# Patient Record
Sex: Female | Born: 1950 | Race: White | Hispanic: No | Marital: Married | Smoking: Former smoker
Health system: Southern US, Community
[De-identification: ages and names within clinical notes are randomized; demographics above are authoritative.]

## PROBLEM LIST (undated history)

## (undated) DIAGNOSIS — I499 Cardiac arrhythmia, unspecified: Secondary | ICD-10-CM

## (undated) DIAGNOSIS — E785 Hyperlipidemia, unspecified: Secondary | ICD-10-CM

## (undated) DIAGNOSIS — M797 Fibromyalgia: Secondary | ICD-10-CM

## (undated) DIAGNOSIS — M1990 Unspecified osteoarthritis, unspecified site: Secondary | ICD-10-CM

## (undated) DIAGNOSIS — T7840XA Allergy, unspecified, initial encounter: Secondary | ICD-10-CM

## (undated) DIAGNOSIS — D649 Anemia, unspecified: Secondary | ICD-10-CM

## (undated) DIAGNOSIS — I10 Essential (primary) hypertension: Secondary | ICD-10-CM

## (undated) DIAGNOSIS — H269 Unspecified cataract: Secondary | ICD-10-CM

## (undated) DIAGNOSIS — K219 Gastro-esophageal reflux disease without esophagitis: Secondary | ICD-10-CM

## (undated) DIAGNOSIS — F419 Anxiety disorder, unspecified: Secondary | ICD-10-CM

## (undated) DIAGNOSIS — S066X9A Traumatic subarachnoid hemorrhage with loss of consciousness of unspecified duration, initial encounter: Secondary | ICD-10-CM

## (undated) HISTORY — PX: TUBAL LIGATION: SHX77

## (undated) HISTORY — PX: HERNIA REPAIR: SHX51

## (undated) HISTORY — DX: Fibromyalgia: M79.7

## (undated) HISTORY — DX: Allergy, unspecified, initial encounter: T78.40XA

## (undated) HISTORY — PX: REPLACEMENT TOTAL KNEE: SUR1224

## (undated) HISTORY — PX: OOPHORECTOMY: SHX86

## (undated) HISTORY — PX: JOINT REPLACEMENT: SHX530

## (undated) HISTORY — DX: Unspecified osteoarthritis, unspecified site: M19.90

## (undated) HISTORY — PX: ESOPHAGOGASTRODUODENOSCOPY: SHX1529

## (undated) HISTORY — DX: Unspecified cataract: H26.9

---
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
## 2008-11-24 DIAGNOSIS — S066XAA Traumatic subarachnoid hemorrhage with loss of consciousness status unknown, initial encounter: Secondary | ICD-10-CM | POA: Insufficient documentation

## 2008-11-24 DIAGNOSIS — S066X9A Traumatic subarachnoid hemorrhage with loss of consciousness of unspecified duration, initial encounter: Secondary | ICD-10-CM

## 2008-11-24 HISTORY — DX: Traumatic subarachnoid hemorrhage with loss of consciousness status unknown, initial encounter: S06.6XAA

## 2008-11-24 HISTORY — DX: Traumatic subarachnoid hemorrhage with loss of consciousness of unspecified duration, initial encounter: S06.6X9A

## 2012-03-17 LAB — HM COLONOSCOPY

## 2013-01-27 DIAGNOSIS — Z6830 Body mass index (BMI) 30.0-30.9, adult: Secondary | ICD-10-CM | POA: Insufficient documentation

## 2013-01-27 HISTORY — DX: Body mass index (BMI) 30.0-30.9, adult: Z68.30

## 2013-09-28 DIAGNOSIS — R002 Palpitations: Secondary | ICD-10-CM | POA: Insufficient documentation

## 2013-09-28 HISTORY — DX: Palpitations: R00.2

## 2014-03-27 DIAGNOSIS — M1712 Unilateral primary osteoarthritis, left knee: Secondary | ICD-10-CM

## 2014-03-27 HISTORY — DX: Unilateral primary osteoarthritis, left knee: M17.12

## 2014-04-05 HISTORY — PX: PARTIAL KNEE ARTHROPLASTY: SHX2174

## 2014-11-21 DIAGNOSIS — M1711 Unilateral primary osteoarthritis, right knee: Secondary | ICD-10-CM

## 2014-11-21 HISTORY — DX: Unilateral primary osteoarthritis, right knee: M17.11

## 2014-12-14 HISTORY — PX: MEDIAL PARTIAL KNEE REPLACEMENT: SHX5965

## 2015-11-06 DIAGNOSIS — K589 Irritable bowel syndrome without diarrhea: Secondary | ICD-10-CM

## 2015-11-06 DIAGNOSIS — K219 Gastro-esophageal reflux disease without esophagitis: Secondary | ICD-10-CM

## 2015-11-06 HISTORY — DX: Gastro-esophageal reflux disease without esophagitis: K21.9

## 2015-11-06 HISTORY — DX: Irritable bowel syndrome, unspecified: K58.9

## 2016-01-22 DIAGNOSIS — K449 Diaphragmatic hernia without obstruction or gangrene: Secondary | ICD-10-CM | POA: Insufficient documentation

## 2016-02-21 HISTORY — PX: NISSEN FUNDOPLICATION: SHX2091

## 2016-03-17 DIAGNOSIS — Z9889 Other specified postprocedural states: Secondary | ICD-10-CM | POA: Insufficient documentation

## 2016-03-17 HISTORY — DX: Other specified postprocedural states: Z98.890

## 2016-04-17 DIAGNOSIS — L853 Xerosis cutis: Secondary | ICD-10-CM | POA: Insufficient documentation

## 2016-04-17 DIAGNOSIS — L918 Other hypertrophic disorders of the skin: Secondary | ICD-10-CM

## 2016-04-17 DIAGNOSIS — L821 Other seborrheic keratosis: Secondary | ICD-10-CM

## 2016-04-17 DIAGNOSIS — D229 Melanocytic nevi, unspecified: Secondary | ICD-10-CM | POA: Insufficient documentation

## 2016-04-17 DIAGNOSIS — D2362 Other benign neoplasm of skin of left upper limb, including shoulder: Secondary | ICD-10-CM

## 2016-04-17 DIAGNOSIS — B372 Candidiasis of skin and nail: Secondary | ICD-10-CM | POA: Insufficient documentation

## 2016-04-17 DIAGNOSIS — L814 Other melanin hyperpigmentation: Secondary | ICD-10-CM | POA: Insufficient documentation

## 2016-04-17 DIAGNOSIS — L738 Other specified follicular disorders: Secondary | ICD-10-CM

## 2016-04-17 DIAGNOSIS — L905 Scar conditions and fibrosis of skin: Secondary | ICD-10-CM | POA: Insufficient documentation

## 2016-04-17 DIAGNOSIS — D1801 Hemangioma of skin and subcutaneous tissue: Secondary | ICD-10-CM

## 2016-04-17 DIAGNOSIS — I499 Cardiac arrhythmia, unspecified: Secondary | ICD-10-CM | POA: Insufficient documentation

## 2016-04-17 HISTORY — DX: Scar conditions and fibrosis of skin: L90.5

## 2016-04-17 HISTORY — DX: Other melanin hyperpigmentation: L81.4

## 2016-04-17 HISTORY — DX: Hemangioma of skin and subcutaneous tissue: D18.01

## 2016-04-17 HISTORY — DX: Melanocytic nevi, unspecified: D22.9

## 2016-04-17 HISTORY — DX: Other seborrheic keratosis: L82.1

## 2016-04-17 HISTORY — DX: Other hypertrophic disorders of the skin: L91.8

## 2016-04-17 HISTORY — DX: Candidiasis of skin and nail: B37.2

## 2016-04-17 HISTORY — DX: Other benign neoplasm of skin of left upper limb, including shoulder: D23.62

## 2016-04-17 HISTORY — DX: Other specified follicular disorders: L73.8

## 2016-04-17 HISTORY — DX: Xerosis cutis: L85.3

## 2016-10-07 DIAGNOSIS — M47817 Spondylosis without myelopathy or radiculopathy, lumbosacral region: Secondary | ICD-10-CM | POA: Insufficient documentation

## 2016-10-07 DIAGNOSIS — M170 Bilateral primary osteoarthritis of knee: Secondary | ICD-10-CM | POA: Insufficient documentation

## 2016-10-07 DIAGNOSIS — G8929 Other chronic pain: Secondary | ICD-10-CM

## 2016-10-07 DIAGNOSIS — M545 Low back pain, unspecified: Secondary | ICD-10-CM

## 2016-10-07 HISTORY — DX: Low back pain, unspecified: M54.50

## 2016-10-07 HISTORY — DX: Spondylosis without myelopathy or radiculopathy, lumbosacral region: M47.817

## 2016-10-07 HISTORY — DX: Other chronic pain: G89.29

## 2016-10-07 HISTORY — DX: Bilateral primary osteoarthritis of knee: M17.0

## 2017-02-11 DIAGNOSIS — I10 Essential (primary) hypertension: Secondary | ICD-10-CM | POA: Diagnosis not present

## 2017-02-11 DIAGNOSIS — R5383 Other fatigue: Secondary | ICD-10-CM | POA: Diagnosis not present

## 2017-02-11 DIAGNOSIS — R6889 Other general symptoms and signs: Secondary | ICD-10-CM | POA: Diagnosis not present

## 2017-02-11 DIAGNOSIS — E785 Hyperlipidemia, unspecified: Secondary | ICD-10-CM | POA: Diagnosis not present

## 2017-02-11 DIAGNOSIS — K219 Gastro-esophageal reflux disease without esophagitis: Secondary | ICD-10-CM | POA: Diagnosis not present

## 2017-02-11 DIAGNOSIS — R0981 Nasal congestion: Secondary | ICD-10-CM | POA: Diagnosis not present

## 2017-02-11 DIAGNOSIS — F329 Major depressive disorder, single episode, unspecified: Secondary | ICD-10-CM | POA: Diagnosis not present

## 2017-02-13 DIAGNOSIS — E559 Vitamin D deficiency, unspecified: Secondary | ICD-10-CM | POA: Diagnosis not present

## 2017-02-13 DIAGNOSIS — I10 Essential (primary) hypertension: Secondary | ICD-10-CM | POA: Diagnosis not present

## 2017-02-13 DIAGNOSIS — R5383 Other fatigue: Secondary | ICD-10-CM | POA: Diagnosis not present

## 2017-02-13 DIAGNOSIS — R7309 Other abnormal glucose: Secondary | ICD-10-CM | POA: Diagnosis not present

## 2017-02-13 DIAGNOSIS — E785 Hyperlipidemia, unspecified: Secondary | ICD-10-CM | POA: Diagnosis not present

## 2017-03-20 DIAGNOSIS — Z87891 Personal history of nicotine dependence: Secondary | ICD-10-CM | POA: Diagnosis not present

## 2017-03-20 DIAGNOSIS — R0789 Other chest pain: Secondary | ICD-10-CM | POA: Diagnosis not present

## 2017-03-20 DIAGNOSIS — E860 Dehydration: Secondary | ICD-10-CM | POA: Diagnosis not present

## 2017-03-20 DIAGNOSIS — R509 Fever, unspecified: Secondary | ICD-10-CM | POA: Diagnosis not present

## 2017-03-20 DIAGNOSIS — D259 Leiomyoma of uterus, unspecified: Secondary | ICD-10-CM | POA: Diagnosis not present

## 2017-03-20 DIAGNOSIS — Z7952 Long term (current) use of systemic steroids: Secondary | ICD-10-CM | POA: Diagnosis not present

## 2017-03-20 DIAGNOSIS — Z792 Long term (current) use of antibiotics: Secondary | ICD-10-CM | POA: Diagnosis not present

## 2017-03-20 DIAGNOSIS — Z79899 Other long term (current) drug therapy: Secondary | ICD-10-CM | POA: Diagnosis not present

## 2017-03-20 DIAGNOSIS — M47816 Spondylosis without myelopathy or radiculopathy, lumbar region: Secondary | ICD-10-CM | POA: Diagnosis not present

## 2017-03-20 DIAGNOSIS — E785 Hyperlipidemia, unspecified: Secondary | ICD-10-CM | POA: Diagnosis not present

## 2017-03-20 DIAGNOSIS — Z7982 Long term (current) use of aspirin: Secondary | ICD-10-CM | POA: Diagnosis not present

## 2017-03-20 DIAGNOSIS — K5730 Diverticulosis of large intestine without perforation or abscess without bleeding: Secondary | ICD-10-CM | POA: Diagnosis not present

## 2017-03-20 DIAGNOSIS — Z79891 Long term (current) use of opiate analgesic: Secondary | ICD-10-CM | POA: Diagnosis not present

## 2017-03-20 DIAGNOSIS — M549 Dorsalgia, unspecified: Secondary | ICD-10-CM | POA: Diagnosis not present

## 2017-04-13 DIAGNOSIS — R509 Fever, unspecified: Secondary | ICD-10-CM | POA: Diagnosis not present

## 2017-04-13 DIAGNOSIS — R3989 Other symptoms and signs involving the genitourinary system: Secondary | ICD-10-CM | POA: Diagnosis not present

## 2017-05-01 DIAGNOSIS — M25561 Pain in right knee: Secondary | ICD-10-CM | POA: Diagnosis not present

## 2017-05-01 DIAGNOSIS — M25562 Pain in left knee: Secondary | ICD-10-CM | POA: Diagnosis not present

## 2017-05-01 DIAGNOSIS — Z96652 Presence of left artificial knee joint: Secondary | ICD-10-CM | POA: Diagnosis not present

## 2017-05-01 DIAGNOSIS — G8929 Other chronic pain: Secondary | ICD-10-CM | POA: Diagnosis not present

## 2017-05-01 DIAGNOSIS — Z96651 Presence of right artificial knee joint: Secondary | ICD-10-CM | POA: Diagnosis not present

## 2017-06-23 DIAGNOSIS — M2550 Pain in unspecified joint: Secondary | ICD-10-CM | POA: Diagnosis not present

## 2017-06-23 DIAGNOSIS — R5383 Other fatigue: Secondary | ICD-10-CM | POA: Diagnosis not present

## 2017-07-10 DIAGNOSIS — F329 Major depressive disorder, single episode, unspecified: Secondary | ICD-10-CM | POA: Diagnosis not present

## 2017-07-10 DIAGNOSIS — M1711 Unilateral primary osteoarthritis, right knee: Secondary | ICD-10-CM | POA: Diagnosis not present

## 2017-07-10 DIAGNOSIS — I10 Essential (primary) hypertension: Secondary | ICD-10-CM | POA: Diagnosis not present

## 2017-07-10 DIAGNOSIS — E785 Hyperlipidemia, unspecified: Secondary | ICD-10-CM | POA: Diagnosis not present

## 2017-09-15 DIAGNOSIS — N644 Mastodynia: Secondary | ICD-10-CM | POA: Diagnosis not present

## 2017-09-15 DIAGNOSIS — Z23 Encounter for immunization: Secondary | ICD-10-CM | POA: Diagnosis not present

## 2017-09-22 DIAGNOSIS — N644 Mastodynia: Secondary | ICD-10-CM | POA: Diagnosis not present

## 2017-10-12 DIAGNOSIS — G8929 Other chronic pain: Secondary | ICD-10-CM | POA: Diagnosis not present

## 2017-10-12 DIAGNOSIS — Z1389 Encounter for screening for other disorder: Secondary | ICD-10-CM | POA: Diagnosis not present

## 2017-10-12 DIAGNOSIS — M545 Low back pain: Secondary | ICD-10-CM | POA: Diagnosis not present

## 2017-10-12 DIAGNOSIS — R1011 Right upper quadrant pain: Secondary | ICD-10-CM | POA: Diagnosis not present

## 2017-10-12 DIAGNOSIS — M25511 Pain in right shoulder: Secondary | ICD-10-CM | POA: Diagnosis not present

## 2017-10-12 DIAGNOSIS — M170 Bilateral primary osteoarthritis of knee: Secondary | ICD-10-CM | POA: Diagnosis not present

## 2017-10-12 DIAGNOSIS — R1012 Left upper quadrant pain: Secondary | ICD-10-CM | POA: Diagnosis not present

## 2017-10-12 DIAGNOSIS — I10 Essential (primary) hypertension: Secondary | ICD-10-CM | POA: Diagnosis not present

## 2017-10-13 DIAGNOSIS — M25511 Pain in right shoulder: Secondary | ICD-10-CM | POA: Diagnosis not present

## 2017-10-13 DIAGNOSIS — M25512 Pain in left shoulder: Secondary | ICD-10-CM | POA: Diagnosis not present

## 2017-10-22 DIAGNOSIS — H532 Diplopia: Secondary | ICD-10-CM | POA: Diagnosis not present

## 2017-10-23 DIAGNOSIS — M25511 Pain in right shoulder: Secondary | ICD-10-CM | POA: Diagnosis not present

## 2017-10-23 DIAGNOSIS — M25512 Pain in left shoulder: Secondary | ICD-10-CM | POA: Diagnosis not present

## 2017-10-27 DIAGNOSIS — M25511 Pain in right shoulder: Secondary | ICD-10-CM | POA: Diagnosis not present

## 2017-10-27 DIAGNOSIS — M25512 Pain in left shoulder: Secondary | ICD-10-CM | POA: Diagnosis not present

## 2017-10-30 DIAGNOSIS — M25512 Pain in left shoulder: Secondary | ICD-10-CM | POA: Diagnosis not present

## 2017-10-30 DIAGNOSIS — M25511 Pain in right shoulder: Secondary | ICD-10-CM | POA: Diagnosis not present

## 2017-11-03 DIAGNOSIS — M25511 Pain in right shoulder: Secondary | ICD-10-CM | POA: Diagnosis not present

## 2017-11-03 DIAGNOSIS — M25512 Pain in left shoulder: Secondary | ICD-10-CM | POA: Diagnosis not present

## 2017-11-06 DIAGNOSIS — M25512 Pain in left shoulder: Secondary | ICD-10-CM | POA: Diagnosis not present

## 2017-11-06 DIAGNOSIS — M25511 Pain in right shoulder: Secondary | ICD-10-CM | POA: Diagnosis not present

## 2017-11-10 DIAGNOSIS — M25512 Pain in left shoulder: Secondary | ICD-10-CM | POA: Diagnosis not present

## 2017-11-10 DIAGNOSIS — M25511 Pain in right shoulder: Secondary | ICD-10-CM | POA: Diagnosis not present

## 2017-12-01 DIAGNOSIS — M25511 Pain in right shoulder: Secondary | ICD-10-CM | POA: Diagnosis not present

## 2017-12-01 DIAGNOSIS — M25512 Pain in left shoulder: Secondary | ICD-10-CM | POA: Diagnosis not present

## 2017-12-03 DIAGNOSIS — M25511 Pain in right shoulder: Secondary | ICD-10-CM | POA: Diagnosis not present

## 2017-12-03 DIAGNOSIS — M25512 Pain in left shoulder: Secondary | ICD-10-CM | POA: Diagnosis not present

## 2017-12-10 DIAGNOSIS — M25512 Pain in left shoulder: Secondary | ICD-10-CM | POA: Diagnosis not present

## 2017-12-10 DIAGNOSIS — M25511 Pain in right shoulder: Secondary | ICD-10-CM | POA: Diagnosis not present

## 2017-12-15 DIAGNOSIS — M25512 Pain in left shoulder: Secondary | ICD-10-CM | POA: Diagnosis not present

## 2017-12-15 DIAGNOSIS — M25511 Pain in right shoulder: Secondary | ICD-10-CM | POA: Diagnosis not present

## 2017-12-31 DIAGNOSIS — R002 Palpitations: Secondary | ICD-10-CM | POA: Diagnosis not present

## 2017-12-31 DIAGNOSIS — M899 Disorder of bone, unspecified: Secondary | ICD-10-CM | POA: Diagnosis not present

## 2017-12-31 DIAGNOSIS — R0789 Other chest pain: Secondary | ICD-10-CM | POA: Diagnosis not present

## 2018-01-01 DIAGNOSIS — I493 Ventricular premature depolarization: Secondary | ICD-10-CM | POA: Diagnosis not present

## 2018-01-01 DIAGNOSIS — I10 Essential (primary) hypertension: Secondary | ICD-10-CM | POA: Diagnosis not present

## 2018-01-01 DIAGNOSIS — R002 Palpitations: Secondary | ICD-10-CM | POA: Diagnosis not present

## 2018-01-01 DIAGNOSIS — E785 Hyperlipidemia, unspecified: Secondary | ICD-10-CM | POA: Diagnosis not present

## 2018-01-01 DIAGNOSIS — G8929 Other chronic pain: Secondary | ICD-10-CM | POA: Diagnosis not present

## 2018-01-01 DIAGNOSIS — R079 Chest pain, unspecified: Secondary | ICD-10-CM | POA: Diagnosis not present

## 2018-01-01 DIAGNOSIS — M899 Disorder of bone, unspecified: Secondary | ICD-10-CM | POA: Diagnosis not present

## 2018-01-04 DIAGNOSIS — I10 Essential (primary) hypertension: Secondary | ICD-10-CM | POA: Diagnosis not present

## 2018-01-04 DIAGNOSIS — R079 Chest pain, unspecified: Secondary | ICD-10-CM | POA: Diagnosis not present

## 2018-01-04 DIAGNOSIS — G8929 Other chronic pain: Secondary | ICD-10-CM | POA: Diagnosis not present

## 2018-01-04 DIAGNOSIS — R002 Palpitations: Secondary | ICD-10-CM | POA: Diagnosis not present

## 2018-01-21 DIAGNOSIS — G8929 Other chronic pain: Secondary | ICD-10-CM | POA: Diagnosis not present

## 2018-01-21 DIAGNOSIS — R079 Chest pain, unspecified: Secondary | ICD-10-CM | POA: Diagnosis not present

## 2018-03-05 DIAGNOSIS — R002 Palpitations: Secondary | ICD-10-CM | POA: Diagnosis not present

## 2018-03-05 DIAGNOSIS — I10 Essential (primary) hypertension: Secondary | ICD-10-CM | POA: Diagnosis not present

## 2018-03-05 DIAGNOSIS — N6001 Solitary cyst of right breast: Secondary | ICD-10-CM | POA: Diagnosis not present

## 2018-03-16 DIAGNOSIS — Z1231 Encounter for screening mammogram for malignant neoplasm of breast: Secondary | ICD-10-CM | POA: Diagnosis not present

## 2018-03-16 LAB — HM MAMMOGRAPHY

## 2018-03-17 DIAGNOSIS — M79674 Pain in right toe(s): Secondary | ICD-10-CM | POA: Diagnosis not present

## 2018-03-17 DIAGNOSIS — M67471 Ganglion, right ankle and foot: Secondary | ICD-10-CM | POA: Diagnosis not present

## 2018-04-02 DIAGNOSIS — M67471 Ganglion, right ankle and foot: Secondary | ICD-10-CM | POA: Diagnosis not present

## 2018-04-02 DIAGNOSIS — M79674 Pain in right toe(s): Secondary | ICD-10-CM | POA: Diagnosis not present

## 2018-05-13 DIAGNOSIS — J0100 Acute maxillary sinusitis, unspecified: Secondary | ICD-10-CM | POA: Diagnosis not present

## 2018-08-16 ENCOUNTER — Ambulatory Visit: Payer: Self-pay | Admitting: Osteopathic Medicine

## 2018-08-17 ENCOUNTER — Encounter: Payer: Self-pay | Admitting: *Deleted

## 2018-08-17 ENCOUNTER — Emergency Department (INDEPENDENT_AMBULATORY_CARE_PROVIDER_SITE_OTHER)
Admission: EM | Admit: 2018-08-17 | Discharge: 2018-08-17 | Disposition: A | Discharge status: Home / Self Care | Payer: Medicare Other | Source: Home / Self Care | Attending: Family Medicine | Admitting: Family Medicine

## 2018-08-17 ENCOUNTER — Other Ambulatory Visit: Payer: Self-pay

## 2018-08-17 DIAGNOSIS — H1132 Conjunctival hemorrhage, left eye: Secondary | ICD-10-CM

## 2018-08-17 HISTORY — DX: Essential (primary) hypertension: I10

## 2018-08-17 HISTORY — DX: Gastro-esophageal reflux disease without esophagitis: K21.9

## 2018-08-17 HISTORY — DX: Unspecified osteoarthritis, unspecified site: M19.90

## 2018-08-17 HISTORY — DX: Anxiety disorder, unspecified: F41.9

## 2018-08-17 HISTORY — DX: Hyperlipidemia, unspecified: E78.5

## 2018-08-17 NOTE — Discharge Instructions (Signed)
May continue using lubricating eye drops ("artificial tears") several times daily.  Avoid rubbing eyes. If symptoms become significantly worse during the night or over the weekend, proceed to the local emergency room.

## 2018-08-17 NOTE — ED Provider Notes (Signed)
[name]    CSN: [id number] Arrival date & time: 08/17/18  6295     History   Chief Complaint Chief Complaint  Patient presents with  . Eye Problem    HPI Melissa Rodgers is a 67 y.o. female.   Patient complains of sudden onset of redness in her left eye four days ago without changes in vision.  She has had mild itching of her eye for several weeks.  She denies pain or foreign body sensation.  She does not wear contact lens.  The history is provided by the patient and the spouse.  Eye Problem  Location:  Left eye Quality:  Aching Severity:  Mild Onset quality:  Sudden Duration:  4 days Timing:  Constant Progression:  Unchanged Chronicity:  New Context: not burn, not chemical exposure, not contact lens problem, not direct trauma, not foreign body, not using machinery, not scratch, not smoke exposure and not UV exposure   Relieved by:  Nothing Worsened by:  Nothing Ineffective treatments: lubricating eye drops. Associated symptoms: itching and redness   Associated symptoms: no blurred vision, no crusting, no decreased vision, no discharge, no double vision, no facial rash, no headaches, no inflammation, no nausea, no photophobia, no scotomas, no swelling and no tearing   Risk factors: no exposure to pinkeye, no previous injury to eye, no recent herpes zoster and no recent URI     Past Medical History:  Diagnosis Date  . Anxiety   . Arthritis   . Dyslipidemia   . GERD (gastroesophageal reflux disease)   . Hypertension     There are no active problems to display for this patient.   Past Surgical History:  Procedure Laterality Date  . HERNIA REPAIR    . OOPHORECTOMY    . REPLACEMENT TOTAL KNEE Bilateral     OB History   None      Home Medications    Prior to Admission medications   Medication Sig Start Date End Date Taking? Authorizing Provider  clonazePAM (KLONOPIN) 0.5 MG tablet Take by mouth. 01/04/18  Yes [provider]    FLUoxetine (PROZAC) 10 MG tablet Take by mouth. 08/30/14  Yes [provider]  losartan (COZAAR) 50 MG tablet Take by mouth. 06/21/18  Yes [provider]  Cetirizine HCl 10 MG CAPS Take by mouth.    [provider]  Cholecalciferol (VITAMIN D-3) 1000 units CAPS Take by mouth.    [provider]  propranolol ER (INDERAL LA) 60 MG 24 hr capsule TK 1 C PO D 08/02/18   [provider]    Family History Family History  Problem Relation Age of Onset  . Diabetes Mother   . Heart disease Mother   . Alzheimer's disease Father   . Atrial fibrillation Sister     Social History Social History   Tobacco Use  . Smoking status: Former Smoker    Last attempt to quit: 08/18/1983    Years since quitting: 35.0  . Smokeless tobacco: Never Used  Substance Use Topics  . Alcohol use: Yes    Comment: 7 q wk  . Drug use: Never     Allergies   Sulfa antibiotics and Rosuvastatin   Review of Systems Review of Systems  Eyes: Positive for redness and itching. Negative for blurred vision, double vision, photophobia and discharge.  Gastrointestinal: Negative for nausea.  Neurological: Negative for headaches.  All other systems reviewed and are negative.    Physical Exam Triage Vital Signs ED  Triage Vitals  Enc Vitals Group     BP      Pulse      Resp      Temp      Temp src      SpO2      Weight      Height      Head Circumference      Peak Flow      Pain Score      Pain Loc      Pain Edu?      Excl. in [place]?    No data found.  Updated Vital Signs BP 119/75 (BP Location: Right Arm)   Pulse 63   Temp 97.8 F (36.6 C) (Oral)   Resp 18   Ht 5\' 3"  (1.6 m)   Wt 83.5 kg   SpO2 98%   BMI 32.59 kg/m   Visual Acuity Right Eye Distance:   Left Eye Distance:   Bilateral Distance:    Right Eye Near:   Left Eye Near:    Bilateral Near:     Physical Exam  Constitutional: She appears well-developed and well-nourished. No distress.   HENT:  Head: Atraumatic.  Right Ear: External ear normal.  Left Ear: External ear normal.  Nose: Nose normal.  Mouth/Throat: Oropharynx is clear and moist.  Eyes: Pupils are equal, round, and reactive to light. EOM and lids are normal. Lids are everted and swept, no foreign bodies found. Right eye exhibits no discharge. Left eye exhibits no chemosis, no discharge, no exudate and no hordeolum. No foreign body present in the left eye. Left conjunctiva has a hemorrhage. No scleral icterus.    Conjunctival hemorrhages left eye as noted on diagram.  Fundi benign. Fluorescein shows no uptake.  Neck: Neck supple.  Cardiovascular: Normal rate.  Pulmonary/Chest: Effort normal.  Musculoskeletal: She exhibits no edema.  Lymphadenopathy:    She has no cervical adenopathy.  Neurological: She is alert.  Skin: Skin is warm and dry.  Nursing note and vitals reviewed.    UC Treatments / Results  Labs (all labs ordered are listed, but only abnormal results are displayed) Labs Reviewed - No data to display  EKG None  Radiology No results found.  Procedures Procedures (including critical care time)  Medications Ordered in UC Medications - No data to display  Initial Impression / Assessment and Plan / UC Course  I have reviewed the triage vital signs and the nursing notes.  Pertinent labs & imaging results that were available during my care of the patient were reviewed by me and considered in my medical decision making (see chart for details).    Recommend follow-up with ophthalmologist if symptoms worsen, or not improved about 3 weeks.   Final Clinical Impressions(s) / UC Diagnoses   Final diagnoses:  Subconjunctival hemorrhage of left eye     Discharge Instructions     May continue using lubricating eye drops ("artificial tears") several times daily.  Avoid rubbing eyes. If symptoms become significantly worse during the night or over the weekend, proceed to the local  emergency room.     ED Prescriptions    None         Kandra Nicolas, MD 08/19/18 [phone]

## 2018-08-17 NOTE — ED Triage Notes (Signed)
Pt c/o LT eye redness x 3 days with aching. She notes itching for weeks. She reports a hx of red eye in the past last episode was 1 year ago. She wears glasses.

## 2018-08-23 ENCOUNTER — Ambulatory Visit: Payer: Self-pay | Admitting: Osteopathic Medicine

## 2018-08-31 ENCOUNTER — Ambulatory Visit: Payer: Medicare Other | Admitting: Osteopathic Medicine

## 2018-09-09 ENCOUNTER — Ambulatory Visit (INDEPENDENT_AMBULATORY_CARE_PROVIDER_SITE_OTHER): Payer: Medicare Other | Admitting: Osteopathic Medicine

## 2018-09-09 ENCOUNTER — Encounter: Payer: Self-pay | Admitting: Osteopathic Medicine

## 2018-09-09 VITALS — BP 126/60 | HR 63 | Temp 98.1°F | Ht 63.0 in | Wt 189.1 lb

## 2018-09-09 DIAGNOSIS — Z9889 Other specified postprocedural states: Secondary | ICD-10-CM

## 2018-09-09 DIAGNOSIS — J3089 Other allergic rhinitis: Secondary | ICD-10-CM | POA: Diagnosis not present

## 2018-09-09 DIAGNOSIS — R232 Flushing: Secondary | ICD-10-CM | POA: Diagnosis not present

## 2018-09-09 DIAGNOSIS — H04129 Dry eye syndrome of unspecified lacrimal gland: Secondary | ICD-10-CM | POA: Insufficient documentation

## 2018-09-09 DIAGNOSIS — I10 Essential (primary) hypertension: Secondary | ICD-10-CM

## 2018-09-09 DIAGNOSIS — E782 Mixed hyperlipidemia: Secondary | ICD-10-CM

## 2018-09-09 DIAGNOSIS — R3129 Other microscopic hematuria: Secondary | ICD-10-CM | POA: Diagnosis not present

## 2018-09-09 DIAGNOSIS — E785 Hyperlipidemia, unspecified: Secondary | ICD-10-CM

## 2018-09-09 DIAGNOSIS — Z23 Encounter for immunization: Secondary | ICD-10-CM | POA: Diagnosis not present

## 2018-09-09 DIAGNOSIS — F329 Major depressive disorder, single episode, unspecified: Secondary | ICD-10-CM

## 2018-09-09 DIAGNOSIS — F418 Other specified anxiety disorders: Secondary | ICD-10-CM

## 2018-09-09 DIAGNOSIS — H04123 Dry eye syndrome of bilateral lacrimal glands: Secondary | ICD-10-CM | POA: Diagnosis not present

## 2018-09-09 DIAGNOSIS — Z8719 Personal history of other diseases of the digestive system: Secondary | ICD-10-CM | POA: Diagnosis not present

## 2018-09-09 DIAGNOSIS — F325 Major depressive disorder, single episode, in full remission: Secondary | ICD-10-CM | POA: Insufficient documentation

## 2018-09-09 DIAGNOSIS — Z87898 Personal history of other specified conditions: Secondary | ICD-10-CM | POA: Insufficient documentation

## 2018-09-09 DIAGNOSIS — R5382 Chronic fatigue, unspecified: Secondary | ICD-10-CM | POA: Diagnosis not present

## 2018-09-09 HISTORY — DX: Hyperlipidemia, unspecified: E78.5

## 2018-09-09 HISTORY — DX: Mixed hyperlipidemia: E78.2

## 2018-09-09 HISTORY — DX: Other microscopic hematuria: R31.29

## 2018-09-09 HISTORY — DX: Major depressive disorder, single episode, unspecified: F32.9

## 2018-09-09 HISTORY — DX: Other allergic rhinitis: J30.89

## 2018-09-09 HISTORY — DX: Personal history of other specified conditions: Z87.898

## 2018-09-09 HISTORY — DX: Dry eye syndrome of bilateral lacrimal glands: H04.123

## 2018-09-09 HISTORY — DX: Dry eye syndrome of unspecified lacrimal gland: H04.129

## 2018-09-09 HISTORY — DX: Essential (primary) hypertension: I10

## 2018-09-09 LAB — CBC WITH DIFFERENTIAL/PLATELET
Basophils Absolute: 48 cells/uL (ref 0–200)
Basophils Relative: 0.7 %
Eosinophils Absolute: 211 cells/uL (ref 15–500)
Eosinophils Relative: 3.1 %
HCT: 41.8 % (ref 35.0–45.0)
Hemoglobin: 14.1 g/dL (ref 11.7–15.5)
Lymphs Abs: 2128 cells/uL (ref 850–3900)
MCH: 29.4 pg (ref 27.0–33.0)
MCHC: 33.7 g/dL (ref 32.0–36.0)
MCV: 87.3 fL (ref 80.0–100.0)
MPV: 10.6 fL (ref 7.5–12.5)
Monocytes Relative: 9.1 %
Neutro Abs: 3794 cells/uL (ref 1500–7800)
Neutrophils Relative %: 55.8 %
Platelets: 281 10*3/uL (ref 140–400)
RBC: 4.79 10*6/uL (ref 3.80–5.10)
RDW: 13.4 % (ref 11.0–15.0)
Total Lymphocyte: 31.3 %
WBC mixed population: 619 cells/uL (ref 200–950)
WBC: 6.8 10*3/uL (ref 3.8–10.8)

## 2018-09-09 LAB — COMPLETE METABOLIC PANEL WITH GFR
AG Ratio: 1.6 (calc) (ref 1.0–2.5)
ALT: 18 U/L (ref 6–29)
AST: 19 U/L (ref 10–35)
Albumin: 4.3 g/dL (ref 3.6–5.1)
Alkaline phosphatase (APISO): 82 U/L (ref 33–130)
BUN: 16 mg/dL (ref 7–25)
CO2: 28 mmol/L (ref 20–32)
Calcium: 9.5 mg/dL (ref 8.6–10.4)
Chloride: 104 mmol/L (ref 98–110)
Creat: 0.59 mg/dL (ref 0.50–0.99)
GFR, Est African American: 109 mL/min/{1.73_m2} (ref >=60)
GFR, Est Non African American: 94 mL/min/{1.73_m2} (ref >=60)
Globulin: 2.7 g/dL (calc) (ref 1.9–3.7)
Glucose, Bld: 98 mg/dL (ref 65–99)
Potassium: 4.3 mmol/L (ref 3.5–5.3)
Sodium: 139 mmol/L (ref 135–146)
Total Bilirubin: 0.4 mg/dL (ref 0.2–1.2)
Total Protein: 7 g/dL (ref 6.1–8.1)

## 2018-09-09 LAB — TSH: TSH: 1.71 mIU/L (ref 0.40–4.50)

## 2018-09-09 MED ORDER — ARTIFICIAL TEARS OPHTHALMIC OINT: TOPICAL_OINTMENT | OPHTHALMIC | 3 refills | Status: DC | PRN

## 2018-09-09 MED ORDER — FLUOXETINE HCL 10 MG PO TABS: 10.0000 mg | ORAL_TABLET | Freq: Every day | ORAL | 3 refills | Status: DC

## 2018-09-09 MED ORDER — CLONAZEPAM 0.5 MG PO TABS: 0.5000 mg | ORAL_TABLET | Freq: Two times a day (BID) | ORAL | 0 refills | Status: DC | PRN

## 2018-09-09 MED ORDER — LOSARTAN POTASSIUM 50 MG PO TABS: 50.0000 mg | ORAL_TABLET | Freq: Every day | ORAL | 3 refills | Status: DC

## 2018-09-09 MED ORDER — PROPRANOLOL HCL ER 60 MG PO CP24: 60.0000 mg | ORAL_CAPSULE | Freq: Every day | ORAL | 3 refills | Status: DC

## 2018-09-09 NOTE — Addendum Note (Signed)
Addended by: Maryla Morrow on: 09/09/2018 11:48 AM   Modules accepted: Orders

## 2018-09-09 NOTE — Patient Instructions (Signed)
Plan:  Consider metoprolol as alternative to propranolol, let me know if you would like to try this medication change.    refills sent for fluoxetine, losartan, clonazepam, and please let us know if/when you need refills on propranolol or switch to metoprolol  New prescription sent for ophthalmic ointment to help with dry eye.  Let me know how this is working for you  Labs today to evaluate hot flashes.  Results should be back by end of this week or early next week, please let us know if you do not get a call.

## 2018-09-09 NOTE — Progress Notes (Signed)
HPI: Melissa Rodgers is a 67 y.o. female who  has a past medical history of Anxiety, Arthritis, Dyslipidemia, GERD (gastroesophageal reflux disease), and Hypertension.  she presents to [facility] today, 09/09/18,  for chief complaint of: New to establish care See individual headings below  Very pleasant new patient here to establish care.  Moved from [place] with her husband after he retired, to be closer to family.   Hypertension Current medications include propranolol 60 mg at bedtime, losartan 50 mg daily.  No problems with these medications.  Hyperlipidemia No statin medications or fish oil on medication list.  Record review looks like previously prescribed rosuvastatin 5 mg.  Below for review of labs.  History of arrhythmia/palpitations Treated w/ propranolol 60 mg daily nightly, Holter monitor was non-concerning, cardiology put her on beta-blocker and symptoms were improved.  Reports some tiredness on the propranolol but nothing severe, thinks may have to do with decreasing her dose of fluoxetine, see below.  Allergies Current medications include cetirizine 10 mg, half tablet daily.  Record review looks like previously was prescribed Flonase  Anxiety/pression Current medications include fluoxetine 10 mg, half tablet daily.  Record review looks like previously prescribed Klonopin, patient reports this in her visit, she states she takes a tablet of weeks as needed.  She is going to try increasing the fluoxetine to a full tablet, needs refills.  Chronic bilateral lower back pain, osteoarthritis, fibromyalgia Nothing bothering her today  GERD/Hiatal hernia GERD resolved after treatment for hiatal hernia, no additional problems with abdominal pain/heartburn.  History of vitamin D deficiency Currently taking 1000 units daily the counter supplementation  Available labs reviewed 01/04/2018, no concerns on metabolic panel, creatinine 0.6,  glucose 94,  06/23/2017, CBC was normal, ESR has been checked a few times, 27 at the highest, previously checked at 13, 19, 10 She had negative testing for Lyme disease and other tickborne illness 06/23/2017 Trace or small blood and urine going back to 07/23/2016 02/13/2017 lipid panel TC 230, TG 223, LDL 141 1 slightly elevated TSH in 2014 but otherwise have been normal Last Pap on file from 2014, NILM  Former smoker, quit in the 1980s. Alcohol use, 5-6 drinks per week Postmenopausal, LMP 2004  New problem: Hot flashes Happens maybe once per day a few hours after taking her morning medicines.  She went through menopause years ago without any significant problems.  No weight loss, no night sweats.    Past medical, surgical, social and family history reviewed:  Patient Active Problem List   Diagnosis Date Noted  . Anxiety with depression 09/09/2018  . Dry eye syndrome of both eyes 09/09/2018  . Microscopic hematuria 09/09/2018  . History of repair of hiatal hernia 09/09/2018  . Non-seasonal allergic rhinitis 09/09/2018  . History of palpitations 09/09/2018  . Mixed hyperlipidemia 09/09/2018  . Essential hypertension 09/09/2018    Past Surgical History:  Procedure Laterality Date  . HERNIA REPAIR    . OOPHORECTOMY    . REPLACEMENT TOTAL KNEE Bilateral     Social History   Tobacco Use  . Smoking status: Former Smoker    Last attempt to quit: 08/18/1983    Years since quitting: 35.0  . Smokeless tobacco: Never Used  Substance Use Topics  . Alcohol use: Yes    Comment: 7 q wk    Family History  Problem Relation Age of Onset  . Diabetes Mother   . Heart disease Mother   . Alzheimer's disease Father   .  Atrial fibrillation Sister      Current medication list and allergy/intolerance information reviewed:    Current Outpatient Medications  Medication Sig Dispense Refill  . Cetirizine HCl 10 MG CAPS Take by mouth.    . Cholecalciferol (VITAMIN D-3) 1000 units CAPS Take  by mouth.    . clonazePAM (KLONOPIN) 0.5 MG tablet Take by mouth.    Marland Kitchen FLUoxetine (PROZAC) 10 MG tablet Take by mouth.    . losartan (COZAAR) 50 MG tablet Take by mouth.    . propranolol ER (INDERAL LA) 60 MG 24 hr capsule TK 1 C PO D  5   No current facility-administered medications for this visit.     Allergies  Allergen Reactions  . Sulfa Antibiotics Rash  . Rosuvastatin     Other reaction(s): Musculoskeletal      Review of Systems:  Constitutional:  No  fever, no chills, No recent illness, No unintentional weight changes. +significant fatigue.   HEENT: No  headache, no vision change, no hearing change, No sore throat, +sinus pressure  Cardiac: No  chest pain, No  pressure, No palpitations, No  Orthopnea  Respiratory:  No  shortness of breath. No  Cough  Gastrointestinal: No  abdominal pain, No  nausea, No  vomiting,  No  blood in stool, No  diarrhea, No  constipation   Musculoskeletal: No new myalgia/arthralgia  Skin: No  Rash, +other wounds/concerning lesions  Genitourinary: +incontinence, No  abnormal genital bleeding, No abnormal genital discharge  Hem/Onc: No  easy bruising/bleeding, No  abnormal lymph node  Endocrine: +cold intolerance,  +heat intolerance. No polyuria/polydipsia/polyphagia   Neurologic: No  weakness, No  dizziness, No  slurred speech/focal weakness/facial droop  Psychiatric: +concerns with depression, +concerns with anxiety, No sleep problems, No mood problems  Exam:  BP 126/60 (BP Location: Right Arm, Patient Position: Sitting, Cuff Size: Normal)   Pulse 63   Temp 98.1 F (36.7 C) (Oral)   Ht '5\' 3"'$  (1.6 m)   Wt 189 lb 1.6 oz (85.8 kg)   BMI 33.50 kg/m   Constitutional: VS see above. General Appearance: alert, well-developed, well-nourished, NAD  Eyes: Normal lids and conjunctive, non-icteric sclera  Ears, Nose, Mouth, Throat: MMM, Normal external inspection ears/nares/mouth/lips/gums. TM normal bilaterally. Pharynx/tonsils no  erythema, no exudate. Nasal mucosa normal.   Neck: No masses, trachea midline. No thyroid enlargement. No tenderness/mass appreciated. No lymphadenopathy  Respiratory: Normal respiratory effort. no wheeze, no rhonchi, no rales  Cardiovascular: S1/S2 normal, no murmur, no rub/gallop auscultated. RRR. No lower extremity edema.   Gastrointestinal: Nontender, no masses. No hepatomegaly, no splenomegaly. No hernia appreciated. Bowel sounds normal. Rectal exam deferred.   Musculoskeletal: Gait normal. No clubbing/cyanosis of digits.   Neurological: Normal balance/coordination. No tremor. No cranial nerve deficit on limited exam. Motor and sensation intact and symmetric. Cerebellar reflexes intact.   Skin: warm, dry, intact. No rash/ulcer. No concerning nevi or subq nodules on limited exam.    Psychiatric: Normal judgment/insight. Normal mood and affect. Oriented x3.    No results found for this or any previous visit (from the past 72 hour(s)).  No results found.   ASSESSMENT/PLAN: The primary encounter diagnosis was Essential hypertension. Diagnoses of Need for influenza vaccination, Mixed hyperlipidemia, History of palpitations, Non-seasonal allergic rhinitis, unspecified trigger, History of repair of hiatal hernia, Microscopic hematuria, Dry eye syndrome of both eyes, Anxiety with depression, Chronic fatigue, and Hot flashes were also pertinent to this visit.  Orders Placed This Encounter  Procedures  . Flu vaccine  HIGH DOSE PF (Fluzone High dose)  . CBC with Differential/Platelet  . COMPLETE METABOLIC PANEL WITH GFR  . TSH   Meds ordered this encounter  Medications  . FLUoxetine (PROZAC) 10 MG tablet    Sig: Take 1 tablet (10 mg total) by mouth daily.    Dispense:  90 tablet    Refill:  3  . clonazePAM (KLONOPIN) 0.5 MG tablet    Sig: Take 1 tablet (0.5 mg total) by mouth 2 (two) times daily as needed for anxiety.    Dispense:  30 tablet    Refill:  0  . losartan (COZAAR) 50  MG tablet    Sig: Take 1 tablet (50 mg total) by mouth daily.    Dispense:  90 tablet    Refill:  3  . propranolol ER (INDERAL LA) 60 MG 24 hr capsule    Sig: Take 1 capsule (60 mg total) by mouth daily.    Dispense:  90 capsule    Refill:  3  . artificial tears (LACRILUBE) OINT ophthalmic ointment    Sig: Place into both eyes every 3 (three) hours as needed for dry eyes.    Dispense:  5 g    Refill:  3     Patient Instructions  Plan:  Consider metoprolol as alternative to propranolol, let me know if you would like to try this medication change.    refills sent for fluoxetine, losartan, clonazepam, and please let us know if/when you need refills on propranolol or switch to metoprolol  New prescription sent for ophthalmic ointment to help with dry eye.  Let me know how this is working for you  Labs today to evaluate hot flashes.  Results should be back by end of this week or early next week, please let us know if you do not get a call.    Visit summary with medication list and pertinent instructions was printed for patient to review. All questions at time of visit were answered - patient instructed to contact office with any additional concerns. ER/RTC precautions were reviewed with the patient.   Follow-up plan: Return in about 6 months (around 03/11/2019) for annual physical with Dr Loni Muse / medicare wellness exam with nurse - sooner if needed.     Please note: voice recognition software was used to produce this document, and typos may escape review. Please contact Dr. Sheppard Coil for any needed clarifications.

## 2018-09-10 ENCOUNTER — Other Ambulatory Visit: Payer: Self-pay

## 2018-09-10 DIAGNOSIS — H04123 Dry eye syndrome of bilateral lacrimal glands: Secondary | ICD-10-CM

## 2018-09-16 ENCOUNTER — Encounter: Payer: Self-pay | Admitting: Osteopathic Medicine

## 2018-11-04 ENCOUNTER — Ambulatory Visit (INDEPENDENT_AMBULATORY_CARE_PROVIDER_SITE_OTHER): Payer: Medicare Other | Admitting: Osteopathic Medicine

## 2018-11-04 ENCOUNTER — Encounter: Payer: Self-pay | Admitting: Osteopathic Medicine

## 2018-11-04 VITALS — BP 143/67 | HR 64 | Temp 98.0°F | Wt 190.8 lb

## 2018-11-04 DIAGNOSIS — J320 Chronic maxillary sinusitis: Secondary | ICD-10-CM | POA: Diagnosis not present

## 2018-11-04 DIAGNOSIS — H539 Unspecified visual disturbance: Secondary | ICD-10-CM

## 2018-11-04 MED ORDER — AMOXICILLIN-POT CLAVULANATE 875-125 MG PO TABS: 1.0000 | ORAL_TABLET | Freq: Two times a day (BID) | ORAL | 0 refills | Status: DC

## 2018-11-04 NOTE — Progress Notes (Signed)
HPI: Melissa Rodgers is a 67 y.o. female who  has a past medical history of Anxiety, Arthritis, Dyslipidemia, GERD (gastroesophageal reflux disease), and Hypertension.  she presents to [facility] today, 11/04/18,  for chief complaint of: Sinus  Feels like she has a cold on L side of her face maxillary area, ear pressure, behind eyes. She has some gum inflammation in the area of infection, recently saw dentist 2 weeks ago.   Reports "spot in my vision" 2 weeks ago, while reading the page she noted some blank spots, when looked up there was still a vision absence in spots, when closing eyes, saw like a jagged edge circle, difficult to describe. Lasted for 20 minutes, resolved on its own by the time she was dressed to go to urgent care. No headache or dizziness. No recurrence, no previous episodes.       Past medical, surgical, social and family history reviewed and updated as necessary.   Current medication list and allergy/intolerance information reviewed:    Current Outpatient Medications  Medication Sig Dispense Refill  . artificial tears (LACRILUBE) OINT ophthalmic ointment Place into both eyes every 3 (three) hours as needed for dry eyes. 5 g 3  . Cetirizine HCl 10 MG CAPS Take by mouth.    . Cholecalciferol (VITAMIN D-3) 1000 units CAPS Take by mouth.    . clonazePAM (KLONOPIN) 0.5 MG tablet Take 1 tablet (0.5 mg total) by mouth 2 (two) times daily as needed for anxiety. 30 tablet 0  . FLUoxetine (PROZAC) 10 MG tablet Take 1 tablet (10 mg total) by mouth daily. 90 tablet 3  . losartan (COZAAR) 50 MG tablet Take 1 tablet (50 mg total) by mouth daily. 90 tablet 3  . propranolol ER (INDERAL LA) 60 MG 24 hr capsule Take 1 capsule (60 mg total) by mouth daily. 90 capsule 3   No current facility-administered medications for this visit.     Allergies  Allergen Reactions  . Sulfa Antibiotics Rash  . Rosuvastatin     Other reaction(s):  Musculoskeletal Aches      Review of Systems:  Constitutional:  No  fever, no chills, No unintentional weight changes. No significant fatigue.   HEENT: No  headache, +vision change per HPI, no hearing change, +sore throat, +sinus pressure  Cardiac: No  chest pain, No  pressure, No palpitations  Respiratory:  No  shortness of breath. No  Cough  Gastrointestinal: No  abdominal pain, No  nausea, No  vomiting,  No  blood in stool, No  diarrhea  Musculoskeletal: No new myalgia/arthralgia  Skin: No  Rash  Neurologic: No  weakness, No  dizziness  Exam:  BP (!) 143/67 (BP Location: Left Arm, Patient Position: Sitting, Cuff Size: Normal)   Pulse 64   Temp 98 F (36.7 C) (Oral)   Wt 190 lb 12.8 oz (86.5 kg)   BMI 33.80 kg/m   Constitutional: VS see above. General Appearance: alert, well-developed, well-nourished, NAD  Eyes: Normal lids and conjunctive, non-icteric sclera, EOMI, PERRL  Ears, Nose, Mouth, Throat: MMM, Normal external inspection ears/nares/mouth/lips/gums. TM normal bilaterally. Pharynx/tonsils no erythema, no exudate. Nasal mucosa normal.   Neck: No masses, trachea midline. No tenderness/mass appreciated. No lymphadenopathy  Respiratory: Normal respiratory effort. no wheeze, no rhonchi, no rales  Cardiovascular: S1/S2 normal, no murmur, no rub/gallop auscultated. RRR. No lower extremity edema.   Gastrointestinal: Nontender, no masses. Bowel sounds normal.  Musculoskeletal: Gait normal.   Neurological: Normal balance/coordination. No tremor.  Skin: warm, dry, intact.   Psychiatric: Normal judgment/insight. Normal mood and affect.     ASSESSMENT/PLAN: The primary encounter diagnosis was Left maxillary sinusitis. A diagnosis of Transient vision disturbance was also pertinent to this visit.   Meds ordered this encounter  Medications  . amoxicillin-clavulanate (AUGMENTIN) 875-125 MG tablet    Sig: Take 1 tablet by mouth 2 (two) times daily.    Dispense:   14 tablet    Refill:  0    Orders Placed This Encounter  Procedures  . Ambulatory referral to Ophthalmology    Patient Instructions  Plan: Antibiotics for presumed sinus/dental infection Call me Monday if you're not feeling better I sent a referral to eye doctor - let us know if you don't hear back soon!       Visit summary with medication list and pertinent instructions was printed for patient to review. All questions at time of visit were answered - patient instructed to contact office with any additional concerns or updates. ER/RTC precautions were reviewed with the patient.   Follow-up plan: Return if symptoms worsen or fail to improve.    Please note: voice recognition software was used to produce this document, and typos may escape review. Please contact Dr. Sheppard Coil for any needed clarifications.

## 2018-11-04 NOTE — Patient Instructions (Signed)
Plan: Antibiotics for presumed sinus/dental infection Call me Monday if you're not feeling better I sent a referral to eye doctor - let us know if you don't hear back soon!

## 2018-11-09 DIAGNOSIS — H2513 Age-related nuclear cataract, bilateral: Secondary | ICD-10-CM | POA: Diagnosis not present

## 2018-11-09 DIAGNOSIS — G43119 Migraine with aura, intractable, without status migrainosus: Secondary | ICD-10-CM | POA: Diagnosis not present

## 2018-12-17 DIAGNOSIS — L820 Inflamed seborrheic keratosis: Secondary | ICD-10-CM | POA: Diagnosis not present

## 2018-12-17 DIAGNOSIS — L538 Other specified erythematous conditions: Secondary | ICD-10-CM | POA: Diagnosis not present

## 2018-12-17 DIAGNOSIS — L578 Other skin changes due to chronic exposure to nonionizing radiation: Secondary | ICD-10-CM | POA: Diagnosis not present

## 2018-12-17 DIAGNOSIS — L821 Other seborrheic keratosis: Secondary | ICD-10-CM | POA: Diagnosis not present

## 2019-01-31 ENCOUNTER — Encounter: Payer: Self-pay | Admitting: Osteopathic Medicine

## 2019-01-31 DIAGNOSIS — I10 Essential (primary) hypertension: Secondary | ICD-10-CM

## 2019-01-31 DIAGNOSIS — Z87898 Personal history of other specified conditions: Secondary | ICD-10-CM

## 2019-03-11 ENCOUNTER — Encounter: Payer: Self-pay | Admitting: Osteopathic Medicine

## 2019-03-11 ENCOUNTER — Telehealth: Payer: Self-pay | Admitting: Osteopathic Medicine

## 2019-03-11 ENCOUNTER — Ambulatory Visit (INDEPENDENT_AMBULATORY_CARE_PROVIDER_SITE_OTHER): Payer: Medicare Other | Admitting: Osteopathic Medicine

## 2019-03-11 VITALS — BP 138/86 | HR 58 | Wt 186.2 lb

## 2019-03-11 DIAGNOSIS — F418 Other specified anxiety disorders: Secondary | ICD-10-CM

## 2019-03-11 DIAGNOSIS — M170 Bilateral primary osteoarthritis of knee: Secondary | ICD-10-CM

## 2019-03-11 DIAGNOSIS — I10 Essential (primary) hypertension: Secondary | ICD-10-CM | POA: Diagnosis not present

## 2019-03-11 DIAGNOSIS — Z87898 Personal history of other specified conditions: Secondary | ICD-10-CM

## 2019-03-11 DIAGNOSIS — Z1239 Encounter for other screening for malignant neoplasm of breast: Secondary | ICD-10-CM

## 2019-03-11 MED ORDER — FLUOXETINE HCL 10 MG PO TABS: 5.0000 mg | ORAL_TABLET | Freq: Every day | ORAL | 3 refills | Status: DC

## 2019-03-11 MED ORDER — PROPRANOLOL HCL ER 60 MG PO CP24: 60.0000 mg | ORAL_CAPSULE | Freq: Every day | ORAL | 3 refills | Status: DC

## 2019-03-11 MED ORDER — DICLOFENAC SODIUM 1 % TD GEL: 4.0000 g | Freq: Four times a day (QID) | TRANSDERMAL | 11 refills | Status: DC

## 2019-03-11 NOTE — Patient Instructions (Addendum)
General Preventive Care  Most recent routine screening lipids/other labs: normal 6 months ago. .   Blood pressure: goal 140/90 or less  Tobacco: don't!  Alcohol: responsible moderation is ok for most adults - if you have concerns about your alcohol intake, please talk to me!   Exercise: as tolerated to reduce risk of cardiovascular disease and diabetes. Strength training will also prevent osteoporosis.   Mental health: if need for mental health care (medicines, counseling, other), or concerns about moods, please let me know!   Sexual health: if need for STD testing, or if concerns with libido/pain problems, please let me know!   Advanced Directive: Living Will and/or Healthcare Power of Attorney recommended for all adults, regardless of age or health.  Vaccines  Flu vaccine: recommended for almost everyone, every fall.   Shingles vaccine: Shingrix recommended after age 94. Some people have had the old Zostavax vaccine, they are eligible for the new Shingrix which is more effective. Medicare patients should as their pharmacist about this medicine, since we are not permitted by Medicare to give it in the office.   Pneumonia vaccines: all done!   Tetanus booster: Tdap recommended every 10 years. Due 02/2025 Cancer screenings   Colon cancer screening: recommended for everyone at age 89-75  Breast cancer screening: mammogram recommended annually after age 86.   Cervical cancer screening: Can usually stop Pap at age 38 or w/ hysterectomy, as long as previous Pap smears normal.  Lung cancer screening: not needed w/ remote history of smoking (quit >15 years ago) Infection screenings . HIV: recommended screening at least once age 45-65, more often as needed. . Gonorrhea/Chlamydia: screening as needed, though many insurances require testing for anyone on birth control pills. . Hepatitis C: recommended for anyone born 35-1966 . TB: certain at-risk populations, or depending on work  requirements and/or travel history Other . Bone Density Test: recommended for women at age 70, men at age 43, sooner depending on risk factors

## 2019-03-11 NOTE — Progress Notes (Signed)
Virtual Visit  via Video or Phone Note  I connected with      Melissa Rodgers on 03/11/19 at 10:00 by a telemedicine application and verified that I am speaking with the correct person using two identifiers.   I discussed the limitations of evaluation and management by telemedicine and the availability of in person appointments. The patient expressed understanding and agreed to proceed.  History of Present Illness: Melissa Rodgers is a 68 y.o. female who would like to discuss  Chief Complaint  Patient presents with  . Blood Pressure Check  . Medication Problem    BP check due.  BP Readings from Last 3 Encounters:  03/11/19 138/86  11/04/18 (!) 143/67  09/09/18 126/60  Was concerned about low BP and feeling cold. Started taking half the losartan.   Wanted to also mention knee pain. Voltaren gel has been helping. Hurts right below and around the joint, worse on the L side.     Depression screen [facility] 2/9 03/11/2019 09/09/2018  Decreased Interest 0 1  Down, Depressed, Hopeless 1 1  PHQ - 2 Score 1 2  Altered sleeping - 1  Tired, decreased energy - 2  Change in appetite - 2  Feeling bad or failure about yourself  - 1  Trouble concentrating - 1  Moving slowly or fidgety/restless - 0  Suicidal thoughts - 0  PHQ-9 Score - 9   GAD 7 : Generalized Anxiety Score 03/11/2019  Nervous, Anxious, on Edge 2  Control/stop worrying 1  Worry too much - different things 1  Trouble relaxing 0  Restless 0  Easily annoyed or irritable 2  Afraid - awful might happen 1  Total GAD 7 Score 7  Anxiety Difficulty Not difficult at all       Observations/Objective: BP 138/86 (BP Location: Right Arm, Patient Position: Sitting, Cuff Size: Normal)   Pulse (!) 58   Wt 186 lb 3.2 oz (84.5 kg)   BMI 32.98 kg/m  BP Readings from Last 3 Encounters:  03/11/19 138/86  11/04/18 (!) 143/67  09/09/18 126/60   Exam: Normal Speech.  NAD, appears well   Lab and Radiology Results No results  found for this or any previous visit (from the past 72 hour(s)). No results found.     Assessment and Plan: 68 y.o. female with The primary encounter diagnosis was Essential hypertension. Diagnoses of History of palpitations, Anxiety with depression, Breast cancer screening, and Osteoarthritis of both knees, unspecified osteoarthritis type were also pertinent to this visit.  Rx Voltaren Sports med f/u when coronavirus risk less Will try to track down colonoscopy report  Needs mammogram  Will discuss Shingrix w/ pharmacy   PDMP not reviewed this encounter. No orders of the defined types were placed in this encounter.  Meds ordered this encounter  Medications  . propranolol ER (INDERAL LA) 60 MG 24 hr capsule    Sig: Take 1 capsule (60 mg total) by mouth daily.    Dispense:  90 capsule    Refill:  3  . FLUoxetine (PROZAC) 10 MG tablet    Sig: Take 0.5 tablets (5 mg total) by mouth daily.    Dispense:  90 tablet    Refill:  3   Patient Instructions  General Preventive Care  Most recent routine screening lipids/other labs: normal 6 months ago. .   Blood pressure: goal 140/90 or less  Tobacco: don't!  Alcohol: responsible moderation is ok for most adults - if you have concerns about your  alcohol intake, please talk to me!   Exercise: as tolerated to reduce risk of cardiovascular disease and diabetes. Strength training will also prevent osteoporosis.   Mental health: if need for mental health care (medicines, counseling, other), or concerns about moods, please let me know!   Sexual health: if need for STD testing, or if concerns with libido/pain problems, please let me know!   Advanced Directive: Living Will and/or Healthcare Power of Attorney recommended for all adults, regardless of age or health.  Vaccines  Flu vaccine: recommended for almost everyone, every fall.   Shingles vaccine: Shingrix recommended after age 34. Some people have had the old Zostavax vaccine, they  are eligible for the new Shingrix which is more effective. Medicare patients should as their pharmacist about this medicine, since we are not permitted by Medicare to give it in the office.   Pneumonia vaccines: all done!   Tetanus booster: Tdap recommended every 10 years. Due 02/2025 Cancer screenings   Colon cancer screening: recommended for everyone at age 16-75  Breast cancer screening: mammogram recommended annually after age 40.   Cervical cancer screening: Can usually stop Pap at age 54 or w/ hysterectomy, as long as previous Pap smears normal.  Lung cancer screening: not needed w/ remote history of smoking (quit >15 years ago) Infection screenings . HIV: recommended screening at least once age 25-65, more often as needed. . Gonorrhea/Chlamydia: screening as needed, though many insurances require testing for anyone on birth control pills. . Hepatitis C: recommended for anyone born 02-1965 . TB: certain at-risk populations, or depending on work requirements and/or travel history Other . Bone Density Test: recommended for women at age 19, men at age 23, sooner depending on risk factors       Instructions sent via [facility]. If MyChart not available, pt was given option for info via personal e-mail w/ no guarantee of protected health info over unsecured e-mail communication, and MyChart sign-up instructions were included.   Follow Up Instructions: Return for sports med check when needed for knee pain. Follow up 6 mos w/ Dr Loni Muse, message sent to schedulers .    I discussed the assessment and treatment plan with the patient. The patient was provided an opportunity to ask questions and all were answered. The patient agreed with the plan and demonstrated an understanding of the instructions.   The patient was advised to call back or seek an in-person evaluation if the symptoms worsen or if the condition fails to improve as anticipated.  I provided 25 minutes of face-to-face time via  video during this encounter.                      Historical information moved to improve visibility of documentation.  Past Medical History:  Diagnosis Date  . Anxiety   . Arthritis   . Dyslipidemia   . GERD (gastroesophageal reflux disease)   . Hypertension    Past Surgical History:  Procedure Laterality Date  . HERNIA REPAIR    . OOPHORECTOMY    . REPLACEMENT TOTAL KNEE Bilateral    Social History   Tobacco Use  . Smoking status: Former Smoker    Last attempt to quit: 08/18/1983    Years since quitting: 35.5  . Smokeless tobacco: Never Used  Substance Use Topics  . Alcohol use: Yes    Comment: 7 q wk   family history includes Alzheimer's disease in her father; Atrial fibrillation in her sister; Diabetes in her mother; Heart  disease in her mother.  Medications: Current Outpatient Medications  Medication Sig Dispense Refill  . Cetirizine HCl 10 MG CAPS Take by mouth.    . Cholecalciferol (VITAMIN D-3) 1000 units CAPS Take by mouth.    . clonazePAM (KLONOPIN) 0.5 MG tablet Take 1 tablet (0.5 mg total) by mouth 2 (two) times daily as needed for anxiety. 30 tablet 0  . FLUoxetine (PROZAC) 10 MG tablet Take 0.5 tablets (5 mg total) by mouth daily. 90 tablet 3  . propranolol ER (INDERAL LA) 60 MG 24 hr capsule Take 1 capsule (60 mg total) by mouth daily. 90 capsule 3   No current facility-administered medications for this visit.    Allergies  Allergen Reactions  . Sulfa Antibiotics Rash  . Rosuvastatin     Other reaction(s): Musculoskeletal Aches    PDMP not reviewed this encounter. No orders of the defined types were placed in this encounter.  Meds ordered this encounter  Medications  . propranolol ER (INDERAL LA) 60 MG 24 hr capsule    Sig: Take 1 capsule (60 mg total) by mouth daily.    Dispense:  90 capsule    Refill:  3  . FLUoxetine (PROZAC) 10 MG tablet    Sig: Take 0.5 tablets (5 mg total) by mouth daily.    Dispense:  90 tablet     Refill:  3

## 2019-03-11 NOTE — Telephone Encounter (Signed)
FYI: lvm for patient to call to schedule physical.  Thanks.

## 2019-03-14 ENCOUNTER — Encounter: Payer: Self-pay | Admitting: Osteopathic Medicine

## 2019-04-15 ENCOUNTER — Encounter: Payer: Self-pay | Admitting: Sports Medicine

## 2019-04-15 ENCOUNTER — Ambulatory Visit (INDEPENDENT_AMBULATORY_CARE_PROVIDER_SITE_OTHER): Payer: Medicare Other | Admitting: Sports Medicine

## 2019-04-15 DIAGNOSIS — Z5321 Procedure and treatment not carried out due to patient leaving prior to being seen by health care provider: Secondary | ICD-10-CM | POA: Insufficient documentation

## 2019-04-15 NOTE — Progress Notes (Signed)
Patient left without being seen.

## 2019-04-15 NOTE — Assessment & Plan Note (Signed)
Patient had a disagreement with a another patient in the waiting room regarding a mask, she left without being seen by me.

## 2019-06-18 ENCOUNTER — Emergency Department (INDEPENDENT_AMBULATORY_CARE_PROVIDER_SITE_OTHER)
Admission: EM | Admit: 2019-06-18 | Discharge: 2019-06-18 | Disposition: A | Discharge status: Other Acute Inpatient Hospital | Payer: Medicare Other | Source: Home / Self Care

## 2019-06-18 ENCOUNTER — Encounter: Payer: Self-pay | Admitting: Emergency Medicine

## 2019-06-18 ENCOUNTER — Other Ambulatory Visit: Payer: Self-pay

## 2019-06-18 DIAGNOSIS — Z888 Allergy status to other drugs, medicaments and biological substances status: Secondary | ICD-10-CM | POA: Diagnosis not present

## 2019-06-18 DIAGNOSIS — Z87891 Personal history of nicotine dependence: Secondary | ICD-10-CM | POA: Diagnosis not present

## 2019-06-18 DIAGNOSIS — Z79899 Other long term (current) drug therapy: Secondary | ICD-10-CM | POA: Diagnosis not present

## 2019-06-18 DIAGNOSIS — I4891 Unspecified atrial fibrillation: Secondary | ICD-10-CM | POA: Diagnosis not present

## 2019-06-18 DIAGNOSIS — I10 Essential (primary) hypertension: Secondary | ICD-10-CM | POA: Diagnosis not present

## 2019-06-18 DIAGNOSIS — I959 Hypotension, unspecified: Secondary | ICD-10-CM | POA: Diagnosis not present

## 2019-06-18 DIAGNOSIS — Z882 Allergy status to sulfonamides status: Secondary | ICD-10-CM | POA: Diagnosis not present

## 2019-06-18 DIAGNOSIS — F419 Anxiety disorder, unspecified: Secondary | ICD-10-CM | POA: Diagnosis not present

## 2019-06-18 DIAGNOSIS — I480 Paroxysmal atrial fibrillation: Secondary | ICD-10-CM | POA: Diagnosis not present

## 2019-06-18 DIAGNOSIS — R079 Chest pain, unspecified: Secondary | ICD-10-CM | POA: Diagnosis not present

## 2019-06-18 DIAGNOSIS — R000 Tachycardia, unspecified: Secondary | ICD-10-CM | POA: Diagnosis not present

## 2019-06-18 DIAGNOSIS — R002 Palpitations: Secondary | ICD-10-CM | POA: Diagnosis not present

## 2019-06-18 DIAGNOSIS — R0602 Shortness of breath: Secondary | ICD-10-CM | POA: Diagnosis not present

## 2019-06-18 DIAGNOSIS — E785 Hyperlipidemia, unspecified: Secondary | ICD-10-CM | POA: Diagnosis not present

## 2019-06-18 HISTORY — DX: Unspecified atrial fibrillation: I48.91

## 2019-06-18 MED ORDER — GENERIC EXTERNAL MEDICATION
5.00 | Status: DC
Start: ? — End: 2019-06-18

## 2019-06-18 NOTE — ED Provider Notes (Signed)
[facility]    CSN: [id number] Arrival date & time: 06/18/19  1440     History   Chief Complaint Chief Complaint  Patient presents with  . Irregular Heartbeat    HPI Melissa Rodgers is a 68 y.o. female.   HPI Melissa Rodgers is a 68 y.o. female presenting to UC with c/o palpitations that started about 20 minutes PTA.  Mild lightheadedness today, "I just feel off."  Pt reports hx of arrhythmias but states she can either cough or take her propranolol and the sensation stops.  Today, nothing has helped. No prior hx of afib but states her sister has had afib before and it sounds like what her sister has had in the past.  Denies chest pain or SOB.   Past Medical History:  Diagnosis Date  . Anxiety   . Arthritis   . Dyslipidemia   . GERD (gastroesophageal reflux disease)   . Hypertension     Patient Active Problem List   Diagnosis Date Noted  . Patient left before evaluation by physician 04/15/2019  . Anxiety with depression 09/09/2018  . Dry eye syndrome of both eyes 09/09/2018  . Microscopic hematuria 09/09/2018  . History of repair of hiatal hernia 09/09/2018  . Non-seasonal allergic rhinitis 09/09/2018  . History of palpitations 09/09/2018  . Mixed hyperlipidemia 09/09/2018  . Essential hypertension 09/09/2018    Past Surgical History:  Procedure Laterality Date  . HERNIA REPAIR    . OOPHORECTOMY    . REPLACEMENT TOTAL KNEE Bilateral     OB History   No obstetric history on file.      Home Medications    Prior to Admission medications   Medication Sig Start Date End Date Taking? Authorizing Provider  Cetirizine HCl 10 MG CAPS Take by mouth.    [provider]  Cholecalciferol (VITAMIN D-3) 1000 units CAPS Take by mouth.    [provider]  clonazePAM (KLONOPIN) 0.5 MG tablet Take 1 tablet (0.5 mg total) by mouth 2 (two) times daily as needed for anxiety. 09/09/18   [name], DO  diclofenac sodium (VOLTAREN) 1 % GEL  Apply 4 g topically 4 (four) times daily. To affected joint. 03/11/19   [name], DO  FLUoxetine (PROZAC) 10 MG tablet Take 0.5 tablets (5 mg total) by mouth daily. 03/11/19   [name], DO  propranolol ER (INDERAL LA) 60 MG 24 hr capsule Take 1 capsule (60 mg total) by mouth daily. 03/11/19   [name], DO    Family History Family History  Problem Relation Age of Onset  . Diabetes Mother   . Heart disease Mother   . Alzheimer's disease Father   . Atrial fibrillation Sister     Social History Social History   Tobacco Use  . Smoking status: Former Smoker    Quit date: 08/18/1983    Years since quitting: 35.8  . Smokeless tobacco: Never Used  Substance Use Topics  . Alcohol use: Yes    Comment: 7 q wk  . Drug use: Never     Allergies   Sulfa antibiotics and Rosuvastatin   Review of Systems Review of Systems  Constitutional: Negative for chills, diaphoresis and fatigue.  Respiratory: Negative for chest tightness and shortness of breath.   Cardiovascular: Positive for palpitations. Negative for chest pain and leg swelling.  Gastrointestinal: Negative for diarrhea, nausea and vomiting.     Physical Exam Triage Vital Signs ED Triage Vitals  Enc Vitals Group  BP      Pulse      Resp      Temp      Temp src      SpO2      Weight      Height      Head Circumference      Peak Flow      Pain Score      Pain Loc      Pain Edu?      Excl. in [place]?    No data found.  Updated Vital Signs BP 127/86 (BP Location: Right Arm)   Pulse (!) 142   Temp 97.6 F (36.4 C) (Oral)   Ht 5\' 3"  (1.6 m)   Wt 184 lb 8 oz (83.7 kg)   SpO2 98%   BMI 32.68 kg/m   Visual Acuity Right Eye Distance:   Left Eye Distance:   Bilateral Distance:    Right Eye Near:   Left Eye Near:    Bilateral Near:     Physical Exam Vitals signs and nursing note reviewed.  Constitutional:      Appearance: She is well-developed.     Comments: Sitting on exam bed,  alert, appears mildly anxious but cooperative during exam.  HENT:     Head: Normocephalic and atraumatic.  Neck:     Musculoskeletal: Normal range of motion.  Cardiovascular:     Rate and Rhythm: Tachycardia present. Rhythm irregular.  Pulmonary:     Effort: Pulmonary effort is normal.  Musculoskeletal: Normal range of motion.  Skin:    General: Skin is warm and dry.     Capillary Refill: Capillary refill takes less than 2 seconds.  Neurological:     Mental Status: She is alert and oriented to person, place, and time.  Psychiatric:        Behavior: Behavior normal.      UC Treatments / Results  Labs (all labs ordered are listed, but only abnormal results are displayed) Labs Reviewed - No data to display  EKG Atrial fibrillation. See scanned report.   Radiology No results found.  Procedures Procedures (including critical care time)  Medications Ordered in UC Medications - No data to display  Initial Impression / Assessment and Plan / UC Course  I have reviewed the triage vital signs and the nursing notes.  Pertinent labs & imaging results that were available during my care of the patient were reviewed by me and considered in my medical decision making (see chart for details).     Pt in new onset a-fib. Advised pt she needs to be evaluated and treated in emergency department. Pt agreeable EMS called, transported pt to [facility]. .  Final Clinical Impressions(s) / UC Diagnoses   Final diagnoses:  New onset a-fib [facility])   Discharge Instructions   None    ED Prescriptions    None     Controlled Substance Prescriptions [place] Controlled Substance Registry consulted? Not Applicable   Tyrell Antonio 06/19/19 1129

## 2019-06-18 NOTE — ED Triage Notes (Signed)
Patient c/o irregular heartbeat today, has a history of irregular PAC's.  Patient tried to take a walk today and feeling lightheaded, just feeling off.

## 2019-06-20 ENCOUNTER — Telehealth: Payer: Self-pay

## 2019-06-20 NOTE — Telephone Encounter (Signed)
Spoke with pt, went to ER, and heart reverted to normal rhythm on it own.  Will follow up as needed.

## 2019-06-22 ENCOUNTER — Encounter: Payer: Self-pay | Admitting: Osteopathic Medicine

## 2019-06-22 ENCOUNTER — Telehealth (INDEPENDENT_AMBULATORY_CARE_PROVIDER_SITE_OTHER): Payer: Medicare Other | Admitting: Osteopathic Medicine

## 2019-06-22 DIAGNOSIS — I4891 Unspecified atrial fibrillation: Secondary | ICD-10-CM

## 2019-06-22 DIAGNOSIS — I10 Essential (primary) hypertension: Secondary | ICD-10-CM | POA: Diagnosis not present

## 2019-06-22 MED ORDER — LOSARTAN POTASSIUM 50 MG PO TABS: 50.0000 mg | ORAL_TABLET | Freq: Every day | ORAL | 3 refills | Status: DC

## 2019-06-22 MED ORDER — LOSARTAN POTASSIUM 50 MG PO TABS: 25.0000 mg | ORAL_TABLET | Freq: Every day | ORAL | 3 refills | Status: DC

## 2019-06-22 NOTE — Progress Notes (Signed)
Virtual Visit via Video (App used: DOximity) Note  I connected with      Melissa Rodgers on 06/22/19  by a telemedicine application and verified that I am speaking with the correct person using two identifiers.  Patient is at home I am in office   I discussed the limitations of evaluation and management by telemedicine and the availability of in person appointments. The patient expressed understanding and agreed to proceed.  History of Present Illness: Melissa Rodgers is a 68 y.o. female who would like to discuss ER follow-up   Hx PAC, palpitations, has been on propranolol for this.  We took her off losartan due to fatigue/low blood pressure and she had been doing pretty well off of this medication but had restarted it at half tablet when blood pressures were a bit on the high side.   Occasionally feels heart racing.  Occasionally feels like "right on the edge of lightheaded," not really dizzy, almost feels like a drunkenness. Feels like a vibration in the chest.  No chest pain, no shortness of breath, no syncope or presyncope.      Observations/Objective: There were no vitals taken for this visit. BP Readings from Last 3 Encounters:  06/18/19 127/86  04/15/19 (!) 154/84  03/11/19 138/86   Exam: Normal Speech.  NAD  Lab and Radiology Results No results found for this or any previous visit (from the past 72 hour(s)). No results found.     Assessment and Plan: 68 y.o. female with The primary encounter diagnosis was New onset a-fib (Rafael Hernandez). A diagnosis of Essential hypertension was also pertinent to this visit.  Advised patient given chads 2 Vascor of 3 that anticoagulation was probably advised.  Patient states that she had been on aspirin in the past and had a lot of obnoxious bleeding issues.  She would like to talk about it more with a cardiologist.  I educated her on the low but nonzero risk of stroke in the interim.  I think reasonable to wait until cardiology  consultation, sounds like paroxysmal atrial fib.  May be amenable to cardioversion.  Patient was given ER precautions with particular regard to syncope/presyncope, heart racing, chest pain, shortness of breath.  PDMP not reviewed this encounter. Orders Placed This Encounter  Procedures  . Ambulatory referral to Cardiology    Referral Priority:   Urgent    Referral Type:   Consultation    Referral Reason:   Specialty Services Required    Requested Specialty:   Cardiology    Number of Visits Requested:   1   Meds ordered this encounter  Medications  . DISCONTD: losartan (COZAAR) 50 MG tablet    Sig: Take 1 tablet (50 mg total) by mouth daily.    Dispense:  90 tablet    Refill:  3  . losartan (COZAAR) 50 MG tablet    Sig: Take 0.5 tablets (25 mg total) by mouth daily.    Dispense:  90 tablet    Refill:  3     Follow Up Instructions: No follow-ups on file.    I discussed the assessment and treatment plan with the patient. The patient was provided an opportunity to ask questions and all were answered. The patient agreed with the plan and demonstrated an understanding of the instructions.   The patient was advised to call back or seek an in-person evaluation if any new concerns, if symptoms worsen or if the condition fails to improve as anticipated.  25 minutes of  non-face-to-face time was provided during this encounter.                      Historical information moved to improve visibility of documentation.  Past Medical History:  Diagnosis Date  . Anxiety   . Arthritis   . Dyslipidemia   . GERD (gastroesophageal reflux disease)   . Hypertension    Past Surgical History:  Procedure Laterality Date  . HERNIA REPAIR    . OOPHORECTOMY    . REPLACEMENT TOTAL KNEE Bilateral    Social History   Tobacco Use  . Smoking status: Former Smoker    Quit date: 08/18/1983    Years since quitting: 35.8  . Smokeless tobacco: Never Used  Substance Use Topics  .  Alcohol use: Yes    Comment: 7 q wk   family history includes Alzheimer's disease in her father; Atrial fibrillation in her sister; Diabetes in her mother; Heart disease in her mother.  Medications: Current Outpatient Medications  Medication Sig Dispense Refill  . Cetirizine HCl 10 MG CAPS Take by mouth.    . Cholecalciferol (VITAMIN D-3) 1000 units CAPS Take by mouth.    . clonazePAM (KLONOPIN) 0.5 MG tablet Take 1 tablet (0.5 mg total) by mouth 2 (two) times daily as needed for anxiety. 30 tablet 0  . diclofenac sodium (VOLTAREN) 1 % GEL Apply 4 g topically 4 (four) times daily. To affected joint. 100 g 11  . FLUoxetine (PROZAC) 10 MG tablet Take 0.5 tablets (5 mg total) by mouth daily. 90 tablet 3  . propranolol ER (INDERAL LA) 60 MG 24 hr capsule Take 1 capsule (60 mg total) by mouth daily. 90 capsule 3  . losartan (COZAAR) 50 MG tablet Take 0.5 tablets (25 mg total) by mouth daily. 90 tablet 3   No current facility-administered medications for this visit.    Allergies  Allergen Reactions  . Sulfa Antibiotics Rash  . Rosuvastatin     Other reaction(s): Musculoskeletal Aches    PDMP not reviewed this encounter. Orders Placed This Encounter  Procedures  . Ambulatory referral to Cardiology    Referral Priority:   Urgent    Referral Type:   Consultation    Referral Reason:   Specialty Services Required    Requested Specialty:   Cardiology    Number of Visits Requested:   1   Meds ordered this encounter  Medications  . DISCONTD: losartan (COZAAR) 50 MG tablet    Sig: Take 1 tablet (50 mg total) by mouth daily.    Dispense:  90 tablet    Refill:  3  . losartan (COZAAR) 50 MG tablet    Sig: Take 0.5 tablets (25 mg total) by mouth daily.    Dispense:  90 tablet    Refill:  3

## 2019-06-29 ENCOUNTER — Ambulatory Visit (INDEPENDENT_AMBULATORY_CARE_PROVIDER_SITE_OTHER): Payer: Medicare Other | Admitting: Family Medicine

## 2019-06-29 ENCOUNTER — Other Ambulatory Visit: Payer: Self-pay

## 2019-06-29 ENCOUNTER — Encounter: Payer: Self-pay | Admitting: Family Medicine

## 2019-06-29 ENCOUNTER — Ambulatory Visit (INDEPENDENT_AMBULATORY_CARE_PROVIDER_SITE_OTHER): Payer: Medicare Other

## 2019-06-29 VITALS — BP 119/69 | HR 58 | Temp 98.2°F | Wt 183.0 lb

## 2019-06-29 DIAGNOSIS — M25562 Pain in left knee: Secondary | ICD-10-CM

## 2019-06-29 DIAGNOSIS — M25551 Pain in right hip: Secondary | ICD-10-CM | POA: Diagnosis not present

## 2019-06-29 DIAGNOSIS — M170 Bilateral primary osteoarthritis of knee: Secondary | ICD-10-CM | POA: Diagnosis not present

## 2019-06-29 DIAGNOSIS — M25561 Pain in right knee: Secondary | ICD-10-CM | POA: Diagnosis not present

## 2019-06-29 DIAGNOSIS — M7061 Trochanteric bursitis, right hip: Secondary | ICD-10-CM

## 2019-06-29 DIAGNOSIS — G8929 Other chronic pain: Secondary | ICD-10-CM

## 2019-06-29 NOTE — Progress Notes (Addendum)
Melissa Rodgers is a 68 y.o. female who presents to [facility] today for knee pain.  Patient has bilateral knee pain left worse than right.  She had partial knee replacement in [place] right on 12/14/2014 and left on 04/05/2014.  She did pretty well until a few years ago when she started having little bit of pain.  However about 6 months ago she started having worse pain.  She notes her pain is predominantly located at the right medial knee.  She has pain especially when she has been standing and rotates to the left (femur externally on tibia) and pain from standing from a seated position.  She is tried some diclofenac gel and a brace which provide only minimal benefit.  She denies any radiating pain weakness or numbness fevers or chills.    ROS:  As above  Exam:  BP 119/69   Pulse (!) 58   Temp 98.2 F (36.8 C) (Oral)   Wt 183 lb (83 kg)   BMI 32.42 kg/m  Wt Readings from Last 5 Encounters:  06/29/19 183 lb (83 kg)  06/18/19 184 lb 8 oz (83.7 kg)  04/15/19 190 lb (86.2 kg)  03/11/19 186 lb 3.2 oz (84.5 kg)  11/04/18 190 lb 12.8 oz (86.5 kg)   General: Well Developed, well nourished, and in no acute distress.  Neuro/Psych: Alert and oriented x3, extra-ocular muscles intact, able to move all 4 extremities, sensation grossly intact. Skin: Warm and dry, no rashes noted.  Respiratory: Not using accessory muscles, speaking in full sentences, trachea midline.  Cardiovascular: Pulses palpable, no extremity edema. Abdomen: Does not appear distended. MSK:  Right knee: Relatively normal-appearing with no significant effusion.  Well appearing mature scar at medial anterior knee. Knee motion 0-120 degrees.  Not particularly tender.  Normal motion and strength.  Left knee: Normal-appearing with no effusion.  Well-appearing mature scar medial anterior knee. Motion 0-120 degrees. Tender palpation anterior to medial knee joint line especially  along the tibia. Normal motion and strength.  Right hip: Normal-appearing normal motion.  Tender palpation greater trochanter.  Hip abduction strength is diminished 4/5  Left hip: Normal-appearing normal motion.  Tender palpation mildly greater trochanter.  Hip adduction strength minimally diminished 4+/5  Lab and Radiology Results X-ray images bilateral knees obtained today personally independently reviewed  Right knee: Intact medial compartment hemiarthroplasty with no fracture.  No severe other degenerative changes.  Left knee: Medial compartment hemiarthroplasty with no fracture.  No severe other degenerative changes present.  Await formal radiology review   Patient provided pictures of former x-rays of knee           Assessment and Plan: 68 y.o. female with  Bilateral knee pain.  Patient is about 5 years out from partial knee replacement and having fair amount of pain and dysfunction.  X-ray per my read looks pretty normal today.  We will proceed with three-phase bone scan and trial of limited physical therapy.  Recheck back in 6 weeks.  Also reasonable to proceed with continued diclofenac gel.  Right hip pain: Trochanteric bursitis.  Also associate with some hip weakness.  Plan to proceed with again physical therapy for this.   PDMP not reviewed this encounter. Orders Placed This Encounter  Procedures  . DG Knee Complete 4 Views Left    Please include patellar sunrise, lateral, and weightbearing bilateral AP and bilateral rosenberg views    Standing Status:   Future    Number of Occurrences:  1    Standing Expiration Date:   08/28/2020    Order Specific Question:   Reason for exam:    Answer:   Please include patellar sunrise, lateral, and weightbearing bilateral AP and bilateral rosenberg views    Comments:   Please include patellar sunrise, lateral, and weightbearing bilateral AP and bilateral rosenberg views    Order Specific Question:   Preferred imaging  location?    Answer:   [facility]  . DG Knee Complete 4 Views Right    Please include patellar sunrise, lateral, and weightbearing bilateral AP and bilateral rosenberg views    Standing Status:   Future    Number of Occurrences:   1    Standing Expiration Date:   08/28/2020    Order Specific Question:   Reason for exam:    Answer:   Please include patellar sunrise, lateral, and weightbearing bilateral AP and bilateral rosenberg views    Comments:   Please include patellar sunrise, lateral, and weightbearing bilateral AP and bilateral rosenberg views    Order Specific Question:   Preferred imaging location?    Answer:   [facility]   No orders of the defined types were placed in this encounter.   Historical information moved to improve visibility of documentation.  Past Medical History:  Diagnosis Date  . Anxiety   . Arthritis   . Dyslipidemia   . GERD (gastroesophageal reflux disease)   . Hypertension    Past Surgical History:  Procedure Laterality Date  . HERNIA REPAIR    . MEDIAL PARTIAL KNEE REPLACEMENT Right 12/14/2014  . OOPHORECTOMY    . PARTIAL KNEE ARTHROPLASTY Left 04/05/2014   Social History   Tobacco Use  . Smoking status: Former Smoker    Quit date: 08/18/1983    Years since quitting: 35.8  . Smokeless tobacco: Never Used  Substance Use Topics  . Alcohol use: Yes    Comment: 7 q wk   family history includes Alzheimer's disease in her father; Atrial fibrillation in her sister; Diabetes in her mother; Heart disease in her mother.  Medications: Current Outpatient Medications  Medication Sig Dispense Refill  . Cetirizine HCl 10 MG CAPS Take by mouth.    . Cholecalciferol (VITAMIN D-3) 1000 units CAPS Take by mouth.    . clonazePAM (KLONOPIN) 0.5 MG tablet Take 1 tablet (0.5 mg total) by mouth 2 (two) times daily as needed for anxiety. 30 tablet 0  . diclofenac sodium (VOLTAREN) 1 % GEL Apply 4 g topically 4 (four) times daily. To  affected joint. 100 g 11  . FLUoxetine (PROZAC) 10 MG tablet Take 0.5 tablets (5 mg total) by mouth daily. 90 tablet 3  . losartan (COZAAR) 50 MG tablet Take 0.5 tablets (25 mg total) by mouth daily. 90 tablet 3  . propranolol ER (INDERAL LA) 60 MG 24 hr capsule Take 1 capsule (60 mg total) by mouth daily. 90 capsule 3   No current facility-administered medications for this visit.    Allergies  Allergen Reactions  . Sulfa Antibiotics Rash  . Rosuvastatin     Other reaction(s): Musculoskeletal Aches      Discussed warning signs or symptoms. Please see discharge instructions. Patient expresses understanding.

## 2019-06-29 NOTE — Patient Instructions (Signed)
Thank you for coming in today. You should hear about the bone scan to evaluate the knee pain.  Attend PT for knee and hip pain.  Recheck with me in 6 weeks.  At that point we should have a better idea of what the next steps are.   I will send the xray report to you when I get it back.   Please send me the old xray pictures. I will put them right in the note.

## 2019-07-05 NOTE — Progress Notes (Deleted)
Cardiology Office Note:    Date:  07/05/2019   ID:  Melissa Rodgers, DOB [birth year], MRN [id number]  PCP:  [name], DO  Cardiologist:  Shirlee More, MD   Referring MD: [name], DO  ASSESSMENT:    No diagnosis found. PLAN:    In order of problems listed above:  1. ***  Next appointment   Medication Adjustments/Labs and Tests Ordered: Current medicines are reviewed at length with the patient today.  Concerns regarding medicines are outlined above.  No orders of the defined types were placed in this encounter.  No orders of the defined types were placed in this encounter.    No chief complaint on file. atrial fibrillation  History of Present Illness:    Melissa Rodgers is a 68 y.o. female who is being seen today for the evaluation of atrial fibrillation at the request of [name], DO. She has a PMH HTN, palpitations, GERD, arthritis, HLD. She was seen in the ED at [facility] on 06/18/19 and diagnosed with new onset atrial fibrillation.    Past Medical History:  Diagnosis Date  . Anxiety   . Arthritis   . Dyslipidemia   . GERD (gastroesophageal reflux disease)   . Hypertension     Past Surgical History:  Procedure Laterality Date  . HERNIA REPAIR    . MEDIAL PARTIAL KNEE REPLACEMENT Right 12/14/2014  . NISSEN FUNDOPLICATION  27/74/1287  . OOPHORECTOMY    . PARTIAL KNEE ARTHROPLASTY Left 04/05/2014    Current Medications: No outpatient medications have been marked as taking for the 07/06/19 encounter (Appointment) with Richardo Priest, MD.     Allergies:   Sulfa antibiotics and Rosuvastatin   Social History   Socioeconomic History  . Marital status: Married    Spouse name: Not on file  . Number of children: 0  . Years of education: Not on file  . Highest education level: Not on file  Occupational History  . Not on file  Social Needs  . Financial resource strain: Not on file  . Food insecurity    Worry: Not  on file    Inability: Not on file  . Transportation needs    Medical: Not on file    Non-medical: Not on file  Tobacco Use  . Smoking status: Former Smoker    Quit date: 08/18/1983    Years since quitting: 35.9  . Smokeless tobacco: Never Used  Substance and Sexual Activity  . Alcohol use: Yes    Comment: 7 q wk  . Drug use: Never  . Sexual activity: Yes    Partners: Male    Birth control/protection: None  Lifestyle  . Physical activity    Days per week: Not on file    Minutes per session: Not on file  . Stress: Not on file  Relationships  . Social Herbalist on phone: Not on file    Gets together: Not on file    Attends religious service: Not on file    Active member of club or organization: Not on file    Attends meetings of clubs or organizations: Not on file    Relationship status: Not on file  Other Topics Concern  . Not on file  Social History Narrative  . Not on file     Family History: The patient's family history includes Alzheimer's disease in her father; Atrial fibrillation in her sister; Diabetes in her mother; Heart disease in her mother.  ROS:  ROS Please see the history of present illness.    *** All other systems reviewed and are negative.  EKGs/Labs/Other Studies Reviewed:    The following studies were reviewed today: ***  EKG:  EKG is *** ordered today.  The ekg ordered today is personally reviewed and demonstrates ***  Recent Labs: 09/09/2018: ALT 18; BUN 16; Creat 0.59; Hemoglobin 14.1; Platelets 281; Potassium 4.3; Sodium 139; TSH 1.71  Recent Lipid Panel No results found for: CHOL, TRIG, HDL, CHOLHDL, VLDL, LDLCALC, LDLDIRECT  Physical Exam:    VS:  There were no vitals taken for this visit.    Wt Readings from Last 3 Encounters:  06/29/19 183 lb (83 kg)  06/18/19 184 lb 8 oz (83.7 kg)  04/15/19 190 lb (86.2 kg)     GEN: *** Well nourished, well developed in no acute distress HEENT: Normal NECK: No JVD; No carotid  bruits LYMPHATICS: No lymphadenopathy CARDIAC: ***RRR, no murmurs, rubs, gallops RESPIRATORY:  Clear to auscultation without rales, wheezing or rhonchi  ABDOMEN: Soft, non-tender, non-distended MUSCULOSKELETAL:  No edema; No deformity  SKIN: Warm and dry NEUROLOGIC:  Alert and oriented x 3 PSYCHIATRIC:  Normal affect     Signed, Shirlee More, MD  07/05/2019 5:40 PM    [facility]

## 2019-07-06 ENCOUNTER — Ambulatory Visit: Payer: Medicare Other | Admitting: Cardiology

## 2019-07-07 ENCOUNTER — Ambulatory Visit (INDEPENDENT_AMBULATORY_CARE_PROVIDER_SITE_OTHER): Payer: Medicare Other | Admitting: Physical Therapy

## 2019-07-07 ENCOUNTER — Encounter: Payer: Self-pay | Admitting: Physical Therapy

## 2019-07-07 ENCOUNTER — Other Ambulatory Visit: Payer: Self-pay

## 2019-07-07 DIAGNOSIS — M25562 Pain in left knee: Secondary | ICD-10-CM

## 2019-07-07 DIAGNOSIS — G8929 Other chronic pain: Secondary | ICD-10-CM | POA: Diagnosis not present

## 2019-07-07 DIAGNOSIS — R29898 Other symptoms and signs involving the musculoskeletal system: Secondary | ICD-10-CM | POA: Diagnosis not present

## 2019-07-07 DIAGNOSIS — M25561 Pain in right knee: Secondary | ICD-10-CM

## 2019-07-07 DIAGNOSIS — M25551 Pain in right hip: Secondary | ICD-10-CM

## 2019-07-07 NOTE — Therapy (Signed)
[facility] [place], [place], [address] Phone: [phone]   Fax:  [phone]  Physical Therapy Evaluation  Patient Details  Name: Melissa Rodgers MRN: [id number] Date of Birth: 1951-08-08 Referring Provider (PT): Gregor Hams, MD   Encounter Date: 07/07/2019  PT End of Session - 07/07/19 1644    Visit Number  1    Number of Visits  12    Date for PT Re-Evaluation  08/18/19    PT Start Time  4827    PT Stop Time  1527    PT Time Calculation (min)  42 min    Activity Tolerance  Patient tolerated treatment well    Behavior During Therapy  [facility] for tasks assessed/performed       Past Medical History:  Diagnosis Date  . Anxiety   . Arthritis   . Dyslipidemia   . GERD (gastroesophageal reflux disease)   . Hypertension     Past Surgical History:  Procedure Laterality Date  . HERNIA REPAIR    . MEDIAL PARTIAL KNEE REPLACEMENT Right 12/14/2014  . NISSEN FUNDOPLICATION  07/86/7544  . OOPHORECTOMY    . PARTIAL KNEE ARTHROPLASTY Left 04/05/2014    There were no vitals filed for this visit.   Subjective Assessment - 07/07/19 1448    Subjective  Pt is a 68 y/o female who presents to OPPT for bil knee pain; Lt worse than Rt.  Pt reports pain is more distal and proximal radiating slightly laterally.  Pt states pain generally improves with mobility, but pain returns following rest after activity.  New pain present with movement after being stationary.  Pt also reports some Rt hip pain - MD feels this is more musculoskeletal at this time.    Pertinent History  mild DJD; bil partial knee replacements (Lt 2015; Rt 2016)    Limitations  Walking;Standing    How long can you stand comfortably?  20-25 min    How long can you walk comfortably?  35-40 min    Patient Stated Goals  improve pain, be able to continue to walk    Currently in Pain?  Yes    Pain Score  0-No pain   up to 10/10 - very short lived; sustained  6-7/10 at times   Pain Location  Knee    Pain Orientation  Left    Pain Descriptors / Indicators  Shooting;Sharp;Aching;Dull    Pain Type  Chronic pain    Pain Onset  More than a month ago    Pain Frequency  Intermittent    Aggravating Factors   from standing position to walking or turning    Pain Relieving Factors  bracing to knee; voltaren gel    Multiple Pain Sites  Yes    Pain Score  0   up to 3/10   Pain Location  Hip    Pain Orientation  Right    Pain Descriptors / Indicators  Aching;Nagging    Pain Type  Chronic pain    Pain Onset  More than a month ago    Pain Frequency  Intermittent    Aggravating Factors   worse at night, trying to sleep at night, bending/squatting    Pain Relieving Factors  pillow between knees         [facility] PT Assessment - 07/07/19 1457      Assessment   Medical Diagnosis  M25.561,M25.562,G89.29 (ICD-10-CM) - Chronic pain of both knees; M25.551 (ICD-10-CM) - Right hip pain  Referring Provider (PT)  Gregor Hams, MD    Onset Date/Surgical Date  --   chronic   Next MD Visit  08/10/2019    Prior Therapy  following knee surgeries      Precautions   Precautions  None      Restrictions   Weight Bearing Restrictions  No      Balance Screen   Has the patient fallen in the past 6 months  No    Has the patient had a decrease in activity level because of a fear of falling?   No    Is the patient reluctant to leave their home because of a fear of falling?   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  [employer]  walking daily; gardening, drawing/painting      Cognition   Overall Cognitive Status  Within Functional Limits for tasks assessed      Observation/Other Assessments   Focus on Therapeutic Outcomes (FOTO)   42 (58% limited; predicted 46% limited)      Posture/Postural Control   Posture/Postural Control  Postural limitations    Postural Limitations  Rounded Shoulders;Forward head      ROM / Strength   AROM / PROM / Strength  AROM;Strength      AROM   Overall AROM   Within functional limits for tasks performed    AROM Assessment Site  Hip;Knee    Right/Left Hip  Right;Left    Right/Left Knee  Right;Left      Strength   Strength Assessment Site  Hip;Knee    Right/Left Hip  Right;Left    Right Hip Flexion  3+/5    Right Hip Extension  3/5    Right Hip External Rotation   4/5    Right Hip Internal Rotation  5/5    Right Hip ABduction  3+/5    Right Hip ADduction  3+/5    Left Hip Flexion  5/5    Left Hip Extension  3/5    Left Hip External Rotation  3/5    Left Hip Internal Rotation  5/5    Left Hip ABduction  3+/5    Left Hip ADduction  3+/5    Right/Left Knee  Right;Left    Right Knee Flexion  5/5    Right Knee Extension  4/5    Left Knee Flexion  4/5    Left Knee Extension  4/5      Flexibility   Soft Tissue Assessment /Muscle Length  yes    Hamstrings  WNL    Quadriceps  tightness bil      Palpation   Palpation comment  trigger points and tenderness Lt lateral quad and adductors; Rt glute med/min                Objective measurements completed on examination: See above findings.      [place]  Adult PT Treatment/Exercise - 07/07/19 1457      Exercises   Exercises  Knee/Hip      Knee/Hip Exercises: Stretches   Other Knee/Hip Stretches  instructed in supine/prone adductor and quad stretch as well as standing versions for each; pt performed 1 rep x 30 sec of each (see pt instructions for more details)      Modalities    Modalities  Iontophoresis      Iontophoresis   Type of Iontophoresis  Dexamethasone    Location  Lt pes anserine bursa    Dose  1.0 cc    Time  6 hour patch             PT Education - 07/07/19 1644    Education Details  HEP, DN, ionto    Person(s) Educated  Patient    Methods  Explanation;Demonstration;Handout    Comprehension  Verbalized understanding;Returned demonstration;Need further instruction          PT Long Term Goals - 07/07/19 1649      PT LONG TERM GOAL #1   Title  independent with HEP    Status  New    [place] Date  08/18/19      PT LONG TERM GOAL #2   Title  improve FOTO score to </= 46% limited for improved function    Status  New    Target Date  08/18/19      PT LONG TERM GOAL #3   Title  report ability to walk 40 min without difficulty for improved functional tolerance    Status  New    Target Date  08/18/19      PT LONG TERM GOAL #4   Title  report decreased pain with standing to < 4/10 in order to improve standing tolerance    Status  New    Target Date  08/18/19             Plan - 07/07/19 1645    Clinical Impression Statement  Pt is a 68 y/o female who presents to OPPT for Lt>Rt knee pain and Rt hip pain.  Pt demonstrates strength deficits and active trigger points as well as decreased flexibility affecting functional mobility.  Pt will benefit from PT to address deficits noted and maximize function.    Personal Factors and Comorbidities  Comorbidity 3+    Comorbidities  bil uni knee replacement, OA, HTN    Examination-Activity Limitations  Stand;Stairs;Locomotion Level    Examination-Participation Restrictions  Tour manager   exercise   Stability/Clinical Decision Making  Evolving/Moderate complexity    Clinical Decision Making  Moderate    Rehab Potential  Good    PT Frequency  2x / week    PT Duration  6 weeks    PT Treatment/Interventions  ADLs/Self Care Home Management;Cryotherapy;Electrical Stimulation;Moist  Heat;Iontophoresis 4mg /ml Dexamethasone;Therapeutic exercise;Therapeutic activities;Functional mobility training;Stair training;Gait training;Ultrasound;Neuromuscular re-education;Patient/family education;Manual techniques;Taping;Dry needling    PT Next Visit Plan  review HEP, progress strengthening; DN/manual/modalities PRN; assess response to ionto    PT Home Exercise Plan  Access Code: [email or web address]    Consulted and Agree with Plan of Care  Patient       Patient will benefit from skilled therapeutic intervention in order to improve the following deficits and impairments:  Pain, Postural dysfunction, Impaired flexibility, Decreased range of motion, Decreased mobility, Difficulty walking, Increased muscle spasms, Increased fascial restricitons, Decreased strength  Visit Diagnosis: 1. Chronic pain of left knee   2. Chronic pain of right knee  3. Other symptoms and signs involving the musculoskeletal system   4. Pain in right hip        Problem List Patient Active Problem List   Diagnosis Date Noted  . Anxiety with depression 09/09/2018  . Dry eye syndrome of both eyes 09/09/2018  . Microscopic hematuria 09/09/2018  . History of repair of hiatal hernia 09/09/2018  . Non-seasonal allergic rhinitis 09/09/2018  . History of palpitations 09/09/2018  . Mixed hyperlipidemia 09/09/2018  . Essential hypertension 09/09/2018      Laureen Abrahams, PT, DPT 07/07/19 4:53 PM     [facility] [place], [place], [address] Phone: [phone]   Fax:  [phone]  Name: Ofelia Podolski MRN: [id number] Date of Birth: Sep 08, 1951

## 2019-07-07 NOTE — Patient Instructions (Addendum)
IONTOPHORESIS PATIENT PRECAUTIONS & CONTRAINDICATIONS:  . Redness under one or both electrodes can occur.  This characterized by a uniform redness that usually disappears within 12 hours of treatment. . Small pinhead size blisters may result in response to the drug.  Contact your physician if the problem persists more than 24 hours. . On rare occasions, iontophoresis therapy can result in temporary skin reactions such as rash, inflammation, irritation or burns.  The skin reactions may be the result of individual sensitivity to the ionic solution used, the condition of the skin at the start of treatment, reaction to the materials in the electrodes, allergies or sensitivity to dexamethasone, or a poor connection between the patch and your skin.  Discontinue using iontophoresis if you have any of these reactions and report to your therapist. . Remove the Patch or electrodes if you have any undue sensation of pain or burning during the treatment and report discomfort to your therapist. . Tell your Therapist if you have had known adverse reactions to the application of electrical current. . If using the Patch, the LED light will turn off when treatment is complete and the patch can be removed.  Approximate treatment time is 1-3 hours.  Remove the patch when light goes off or after 6 hours. . The Patch can be worn during normal activity, however excessive motion where the electrodes have been placed can cause poor contact between the skin and the electrode or uneven electrical current resulting in greater risk of skin irritation. Marland Kitchen Keep out of the reach of children.   . DO NOT use if you have a cardiac pacemaker or any other electrically sensitive implanted device. . DO NOT use if you have a known sensitivity to dexamethasone. . DO NOT use during Magnetic Resonance Imaging (MRI). . DO NOT use over broken or compromised skin (e.g. sunburn, cuts, or acne) due to the increased risk of skin reaction. . DO  NOT SHAVE over the area to be treated:  To establish good contact between the Patch and the skin, excessive hair may be clipped. . DO NOT place the Patch or electrodes on or over your eyes, directly over your heart, or brain. . DO NOT reuse the Patch or electrodes as this may cause burns to occur. Access Code: [email or web address]  URL: [email or web address]  Date: 07/07/2019  Prepared by: Faustino Congress   Exercises  Hip Adductors and Hamstring Stretch with Strap - 3 reps - 1 sets - 30 sec hold - 2x daily - 7x weekly  Side Lunge Adductor Stretch - 3 reps - 1 sets - 30 sec hold - 2x daily - 7x weekly  Prone Quadriceps Stretch with Strap - 3 reps - 1 sets - 30 sec hold - 2x daily - 7x weekly  Standing Quadriceps Stretch - 3 reps - 1 sets - 30 sec hold - 2x daily - 7x weekly  Patient Education  Trigger Point Dry Needling

## 2019-07-12 ENCOUNTER — Encounter: Payer: Self-pay | Admitting: Physical Therapy

## 2019-07-12 ENCOUNTER — Ambulatory Visit (INDEPENDENT_AMBULATORY_CARE_PROVIDER_SITE_OTHER): Payer: Medicare Other | Admitting: Physical Therapy

## 2019-07-12 ENCOUNTER — Other Ambulatory Visit: Payer: Self-pay

## 2019-07-12 DIAGNOSIS — G8929 Other chronic pain: Secondary | ICD-10-CM | POA: Diagnosis not present

## 2019-07-12 DIAGNOSIS — M25561 Pain in right knee: Secondary | ICD-10-CM

## 2019-07-12 DIAGNOSIS — M25562 Pain in left knee: Secondary | ICD-10-CM

## 2019-07-12 DIAGNOSIS — M25551 Pain in right hip: Secondary | ICD-10-CM

## 2019-07-12 DIAGNOSIS — R29898 Other symptoms and signs involving the musculoskeletal system: Secondary | ICD-10-CM | POA: Diagnosis not present

## 2019-07-12 NOTE — Therapy (Signed)
[place], [place], [address] Phone: [phone]   Fax:  [phone]  Physical Therapy Treatment  Patient Details  Name: Melissa Rodgers MRN: [id number] Date of Birth: 1951/08/06 Referring Provider (PT): Gregor Hams, MD   Encounter Date: 07/12/2019  PT End of Session - 07/12/19 1126    Visit Number  2    Number of Visits  12    Date for PT Re-Evaluation  08/18/19    PT Start Time  0845    PT Stop Time  0927    PT Time Calculation (min)  42 min    Activity Tolerance  Patient tolerated treatment well    Behavior During Therapy  [facility] for tasks assessed/performed       Past Medical History:  Diagnosis Date  . Anxiety   . Arthritis   . Dyslipidemia   . GERD (gastroesophageal reflux disease)   . Hypertension     Past Surgical History:  Procedure Laterality Date  . HERNIA REPAIR    . MEDIAL PARTIAL KNEE REPLACEMENT Right 12/14/2014  . NISSEN FUNDOPLICATION  02/58/5277  . OOPHORECTOMY    . PARTIAL KNEE ARTHROPLASTY Left 04/05/2014    There were no vitals filed for this visit.  Subjective Assessment - 07/12/19 0848    Subjective  had one good day; "I don't know if it was the patch or what." but now knees feel the same; hip is okay today    Pertinent History  mild DJD; bil partial knee replacements (Lt 2015; Rt 2016)    Limitations  Walking;Standing    How long can you stand comfortably?  20-25 min    How long can you walk comfortably?  35-40 min    Patient Stated Goals  improve pain, be able to continue to walk    Currently in Pain?  No/denies    Pain Onset  More than a month ago    Pain Onset  More than a month ago                       [facility] Adult PT Treatment/Exercise - 07/12/19 0849      Knee/Hip Exercises: Stretches   Quad Stretch  Both;2 reps;30 seconds    Quad Stretch Limitations  prone with strap    Other Knee/Hip Stretches  supine adductor stretch 2x30 sec  bil      Knee/Hip Exercises: Aerobic   Nustep  L4 x 5 min      Knee/Hip Exercises: Supine   Straight Leg Raise with External Rotation  Both;10 reps      Knee/Hip Exercises: Prone   Straight Leg Raises  Both;10 reps      Manual Therapy   Manual Therapy  Soft tissue mobilization    Soft tissue mobilization  Lt lateral quad and adductors       Trigger Point Dry Needling - 07/12/19 8242    Consent Given?  Yes    Education Handout Provided  Yes    Muscles Treated Lower Quadrant  Adductor longus/brevis/magnus    Electrical Stimulation Performed with Dry Needling  Yes    E-stim with Dry Needling Details  10 mHz freq x 5 min; intensity to tolerance    Adductor Response  Twitch response elicited;Palpable increased muscle length           PT Education - 07/12/19 1126    Education Details  added to HEP    Person(s) Educated  Patient  Methods  Explanation;Handout    Comprehension  Verbalized understanding          PT Long Term Goals - 07/07/19 1649      PT LONG TERM GOAL #1   Title  independent with HEP    Status  New    Target Date  08/18/19      PT LONG TERM GOAL #2   Title  improve FOTO score to </= 46% limited for improved function    Status  New    Target Date  08/18/19      PT LONG TERM GOAL #3   Title  report ability to walk 40 min without difficulty for improved functional tolerance    Status  New    Target Date  08/18/19      PT LONG TERM GOAL #4   Title  report decreased pain with standing to < 4/10 in order to improve standing tolerance    Status  New    Target Date  08/18/19            Plan - 07/12/19 1127    Clinical Impression Statement  Pt tolerated session well today with decreased tightness and tenderness following manual therapy and DN today.  Able to progress HEP to add strengthening exercises into program, and will continue to progress as pt tolerates.  Responsed positively to ionto last visit, but only lasted ~ 24 hours then pain  returned.  Will continue to monitor for appropriateness and benefits to PT interventions.    Personal Factors and Comorbidities  Comorbidity 3+    Comorbidities  bil uni knee replacement, OA, HTN    Examination-Activity Limitations  Stand;Stairs;Locomotion Level    Examination-Participation Restrictions  Tour manager   exercise   Stability/Clinical Decision Making  Evolving/Moderate complexity    Rehab Potential  Good    PT Frequency  2x / week    PT Duration  6 weeks    PT Treatment/Interventions  ADLs/Self Care Home Management;Cryotherapy;Electrical Stimulation;Moist Heat;Iontophoresis 4mg /ml Dexamethasone;Therapeutic exercise;Therapeutic activities;Functional mobility training;Stair training;Gait training;Ultrasound;Neuromuscular re-education;Patient/family education;Manual techniques;Taping;Dry needling    PT Next Visit Plan  review HEP, progress strengthening; DN/manual/modalities PRN; assess response to DN, manual    PT Home Exercise Plan  Access Code: [email or web address]    Consulted and Agree with Plan of Care  Patient       Patient will benefit from skilled therapeutic intervention in order to improve the following deficits and impairments:  Pain, Postural dysfunction, Impaired flexibility, Decreased range of motion, Decreased mobility, Difficulty walking, Increased muscle spasms, Increased fascial restricitons, Decreased strength  Visit Diagnosis: 1. Chronic pain of left knee   2. Chronic pain of right knee   3. Other symptoms and signs involving the musculoskeletal system   4. Pain in right hip        Problem List Patient Active Problem List   Diagnosis Date Noted  . Anxiety with depression 09/09/2018  . Dry eye syndrome of both eyes 09/09/2018  . Microscopic hematuria 09/09/2018  . History of repair of hiatal hernia 09/09/2018  . Non-seasonal allergic rhinitis 09/09/2018  . History of palpitations 09/09/2018  . Mixed hyperlipidemia 09/09/2018  . Essential hypertension  09/09/2018      Laureen Abrahams, PT, DPT 07/12/19 11:30 AM     [facility] [place], [place], [address] Phone: [phone]   Fax:  [phone]  Name: Melissa Rodgers MRN: [id number] Date of Birth: 1951/05/30

## 2019-07-12 NOTE — Patient Instructions (Signed)
Access Code: [email or web address]  URL: [email or web address]  Date: 07/12/2019  Prepared by: Faustino Congress   Exercises  Hip Adductors and Hamstring Stretch with Strap - 3 reps - 1 sets - 30 sec hold - 2x daily - 7x weekly  Side Lunge Adductor Stretch - 3 reps - 1 sets - 30 sec hold - 2x daily - 7x weekly  Prone Quadriceps Stretch with Strap - 3 reps - 1 sets - 30 sec hold - 2x daily - 7x weekly  Standing Quadriceps Stretch - 3 reps - 1 sets - 30 sec hold - 2x daily - 7x weekly  Prone Hip Extension - 10 reps - 1 sets - 1-2 hold - 2x daily - 7x weekly  Straight Leg Raise with External Rotation - 10 reps - 1 sets - 2-3 sec hold - 2x daily - 7x weekly  Patient Education  Trigger Point Dry Needling

## 2019-07-15 ENCOUNTER — Other Ambulatory Visit: Payer: Self-pay

## 2019-07-15 ENCOUNTER — Ambulatory Visit (INDEPENDENT_AMBULATORY_CARE_PROVIDER_SITE_OTHER): Payer: Medicare Other | Admitting: Physical Therapy

## 2019-07-15 DIAGNOSIS — M25562 Pain in left knee: Secondary | ICD-10-CM

## 2019-07-15 DIAGNOSIS — M25551 Pain in right hip: Secondary | ICD-10-CM

## 2019-07-15 DIAGNOSIS — R29898 Other symptoms and signs involving the musculoskeletal system: Secondary | ICD-10-CM

## 2019-07-15 DIAGNOSIS — M25561 Pain in right knee: Secondary | ICD-10-CM | POA: Diagnosis not present

## 2019-07-15 DIAGNOSIS — G8929 Other chronic pain: Secondary | ICD-10-CM

## 2019-07-15 NOTE — Patient Instructions (Signed)

## 2019-07-15 NOTE — Therapy (Signed)
[place], [place], [address] Phone: [phone]   Fax:  [phone]  Physical Therapy Treatment  Patient Details  Name: Melissa Rodgers MRN: [id number] Date of Birth: 1951-04-15 Referring Provider (PT): Gregor Hams, MD   Encounter Date: 07/15/2019  PT End of Session - 07/15/19 1024    Visit Number  3    Number of Visits  12    Date for PT Re-Evaluation  08/18/19    PT Start Time  1021    PT Stop Time  1100    PT Time Calculation (min)  39 min       Past Medical History:  Diagnosis Date  . Anxiety   . Arthritis   . Dyslipidemia   . GERD (gastroesophageal reflux disease)   . Hypertension     Past Surgical History:  Procedure Laterality Date  . HERNIA REPAIR    . MEDIAL PARTIAL KNEE REPLACEMENT Right 12/14/2014  . NISSEN FUNDOPLICATION  [id number]  . OOPHORECTOMY    . PARTIAL KNEE ARTHROPLASTY Left 04/05/2014    There were no vitals filed for this visit.  Subjective Assessment - 07/15/19 1026    Subjective  Pt reports she didn't not notice a big difference in knee pain after last treatment.  She decided she needs a firm surface to do exercises on instead of couch or bed.    Currently in Pain?  Yes    Pain Score  4     Pain Location  Knee    Pain Orientation  Left;Right    Pain Descriptors / Indicators  Stabbing;Nagging    Aggravating Factors   standing still to initial movement    Pain Relieving Factors  movement, voltaren gel.         [facility] PT Assessment - 07/15/19 0001      Assessment   Medical Diagnosis  M25.561,M25.562,G89.29 (ICD-10-CM) - Chronic pain of both knees; M25.551 (ICD-10-CM) - Right hip pain    Referring Provider (PT)  Gregor Hams, MD    Onset Date/Surgical Date  --   chronic   Next MD Visit  08/10/2019    Prior Therapy  following knee surgeries       [facility] Adult PT Treatment/Exercise - 07/15/19 0001      Self-Care   Self-Care  Other Self-Care  Comments    Other Self-Care Comments   Pt instructed in self massage to LE with roller stick; pt returned demo with cues.       Knee/Hip Exercises: Stretches   Passive Hamstring Stretch  Right;Left;2 reps;20 seconds    Quad Stretch  Right;Left;2 reps;30 seconds   seated with foot under chair    Hip Flexor Stretch  Right;Left;2 reps;20 seconds   seated   Gastroc Stretch  2 reps;Both;30 seconds    Other Knee/Hip Stretches  modified triangle pose x 2 reps each side       Knee/Hip Exercises: Aerobic   Nustep  L4 x 5.5 min      Manual Therapy   Manual Therapy  Soft tissue mobilization;Taping    Manual therapy comments  sensitive skin Rock tape placed in X pattern over Lt pes anserine and Rt ITB at knee with 20% stretch to decompress tissue and decrease pain.     Soft tissue mobilization  IASTM and STM to Lt medial knee, adductor and distal hamstring, and Rt lateral knee and quad.   Pt hypersensitive with use of tool, despite of limited pressure; tolerates  hands on better.              PT Education - 07/15/19 1052    Education Details  kinesiotape, hep- added seated quad stretch    Person(s) Educated  Patient    Methods  Explanation;Demonstration;Handout    Comprehension  Verbalized understanding;Returned demonstration          PT Long Term Goals - 07/07/19 1649      PT LONG TERM GOAL #1   Title  independent with HEP    Status  New    Target Date  08/18/19      PT LONG TERM GOAL #2   Title  improve FOTO score to </= 46% limited for improved function    Status  New    Target Date  08/18/19      PT LONG TERM GOAL #3   Title  report ability to walk 40 min without difficulty for improved functional tolerance    Status  New    Target Date  08/18/19      PT LONG TERM GOAL #4   Title  report decreased pain with standing to < 4/10 in order to improve standing tolerance    Status  New    Target Date  08/18/19            Plan - 07/15/19 1633    Clinical Impression  Statement  Pt reported reduction of tightness in knees with seated quad stretch; unable to complete standing version and doesn't prefer laying on stomach at home. Added this to her HEP.  Pt had difficulty tolerating IASTM to Lt adductor and Rt distal ITB; improved tolerance with STM with hands instead.  Trial of Rock tape applied to knees today; pt reported reduction of pain after application.  Progressing towards goals.    Personal Factors and Comorbidities  Comorbidity 3+    Comorbidities  bil uni knee replacement, OA, HTN    Examination-Activity Limitations  Stand;Stairs;Locomotion Level    Examination-Participation Restrictions  Tour manager   exercise   Stability/Clinical Decision Making  Evolving/Moderate complexity    Rehab Potential  Good    PT Frequency  2x / week    PT Duration  6 weeks    PT Treatment/Interventions  ADLs/Self Care Home Management;Cryotherapy;Electrical Stimulation;Moist Heat;Iontophoresis 4mg /ml Dexamethasone;Therapeutic exercise;Therapeutic activities;Functional mobility training;Stair training;Gait training;Ultrasound;Neuromuscular re-education;Patient/family education;Manual techniques;Taping;Dry needling    PT Next Visit Plan  review HEP, progress strengthening; DN/manual/modalities PRN; assess response to tape and STM.    PT Home Exercise Plan  Access Code: [email or web address]    Consulted and Agree with Plan of Care  Patient       Patient will benefit from skilled therapeutic intervention in order to improve the following deficits and impairments:  Pain, Postural dysfunction, Impaired flexibility, Decreased range of motion, Decreased mobility, Difficulty walking, Increased muscle spasms, Increased fascial restricitons, Decreased strength  Visit Diagnosis: Chronic pain of left knee  Chronic pain of right knee  Other symptoms and signs involving the musculoskeletal system  Pain in right hip     Problem List Patient Active Problem List   Diagnosis Date Noted   . Anxiety with depression 09/09/2018  . Dry eye syndrome of both eyes 09/09/2018  . Microscopic hematuria 09/09/2018  . History of repair of hiatal hernia 09/09/2018  . Non-seasonal allergic rhinitis 09/09/2018  . History of palpitations 09/09/2018  . Mixed hyperlipidemia 09/09/2018  . Essential hypertension 09/09/2018   Kerin Perna, PTA 07/15/19 4:37 PM  [place], [place], [address] Phone: [phone]   Fax:  [phone]  Name: Melissa Rodgers MRN: [id number] Date of Birth: 1951-03-23

## 2019-07-19 ENCOUNTER — Ambulatory Visit (INDEPENDENT_AMBULATORY_CARE_PROVIDER_SITE_OTHER): Payer: Medicare Other | Admitting: Physical Therapy

## 2019-07-19 ENCOUNTER — Other Ambulatory Visit: Payer: Self-pay

## 2019-07-19 ENCOUNTER — Encounter: Payer: Self-pay | Admitting: Physical Therapy

## 2019-07-19 DIAGNOSIS — R29898 Other symptoms and signs involving the musculoskeletal system: Secondary | ICD-10-CM

## 2019-07-19 DIAGNOSIS — M25562 Pain in left knee: Secondary | ICD-10-CM | POA: Diagnosis not present

## 2019-07-19 DIAGNOSIS — M25561 Pain in right knee: Secondary | ICD-10-CM

## 2019-07-19 DIAGNOSIS — G8929 Other chronic pain: Secondary | ICD-10-CM

## 2019-07-19 NOTE — Therapy (Signed)
[place], [place], [address] Phone: [phone]   Fax:  [phone]  Physical Therapy Treatment  Patient Details  Name: Melissa Rodgers MRN: [id number] Date of Birth: 06-May-1951 Referring Provider (PT): Gregor Hams, MD   Encounter Date: 07/19/2019  PT End of Session - 07/19/19 0852    Visit Number  4    Number of Visits  12    Date for PT Re-Evaluation  08/18/19    PT Start Time  0849    PT Stop Time  0933    PT Time Calculation (min)  44 min       Past Medical History:  Diagnosis Date  . Anxiety   . Arthritis   . Dyslipidemia   . GERD (gastroesophageal reflux disease)   . Hypertension     Past Surgical History:  Procedure Laterality Date  . HERNIA REPAIR    . MEDIAL PARTIAL KNEE REPLACEMENT Right 12/14/2014  . NISSEN FUNDOPLICATION  [id number]  . OOPHORECTOMY    . PARTIAL KNEE ARTHROPLASTY Left 04/05/2014    There were no vitals filed for this visit.  Subjective Assessment - 07/19/19 0852    Subjective  Pt reports the tape stayed on until monday.  No skin reaction. She reports it decrease her Lt knee pain, "the sharp pains weren't as sharp".  She brought her KT tape that she had at home.    Pertinent History  mild DJD; bil partial knee replacements (Lt 2015; Rt 2016)    Patient Stated Goals  improve pain, be able to continue to walk    Currently in Pain?  Yes    Pain Score  4    was 7/10 upon waking   Pain Location  Knee    Pain Orientation  Left;Right    Pain Descriptors / Indicators  Dull    Aggravating Factors   standing still to initial movement, late in day.    Pain Relieving Factors  movement, voltarent gel         [facility] PT Assessment - 07/19/19 0001      Assessment   Medical Diagnosis  M25.561,M25.562,G89.29 (ICD-10-CM) - Chronic pain of both knees; M25.551 (ICD-10-CM) - Right hip pain    Referring Provider (PT)  Gregor Hams, MD    Onset Date/Surgical Date  --    chronic   Next MD Visit  08/10/2019    Prior Therapy  following knee surgeries      [facility] Adult PT Treatment/Exercise - 07/19/19 0001      Knee/Hip Exercises: Stretches   Passive Hamstring Stretch  Right;Left;2 reps;20 seconds   supine with strap   Quad Stretch  Right;Left;2 reps;30 seconds   seated with foot under chair    Hip Flexor Stretch  --    Gastroc Stretch  Left;Right;2 reps;20 seconds   heel off step x 1, runners stretch x 1   Other Knee/Hip Stretches  butterfly stretch x 30 sec      Knee/Hip Exercises: Aerobic   Recumbent Bike  L1: 5.5  min      Knee/Hip Exercises: Standing   Hip Extension  Stengthening;Right;Left;1 set;10 reps;Knee straight    Extension Limitations  VC to not lock knees.  reported some muscle cramping in Lt hamstring with hip ext and Rt hip soreness in SLS.       Knee/Hip Exercises: Seated   Sit to Sand  1 set;without UE support;5 reps      Knee/Hip Exercises: Supine  Bridges  Strengthening;Both;10 reps   3 sec hold in ext     Knee/Hip Exercises: Sidelying   Hip ABduction  Strengthening;Right;Left;2 sets;5 reps      Manual Therapy   Manual therapy comments  sensitive skin Rock tape placed in X pattern over Lt pes anserine with 20% stretch to decompress tissue and decrease pain.              PT Education - 07/19/19 1250    Education Details  HEP - added strengthening    Person(s) Educated  Patient    Methods  Explanation;Handout;Verbal cues;Demonstration    Comprehension  Verbalized understanding;Returned demonstration          PT Long Term Goals - 07/07/19 1649      PT LONG TERM GOAL #1   Title  independent with HEP    Status  New    Target Date  08/18/19      PT LONG TERM GOAL #2   Title  improve FOTO score to </= 46% limited for improved function    Status  New    Target Date  08/18/19      PT LONG TERM GOAL #3   Title  report ability to walk 40 min without difficulty for improved functional tolerance    Status  New     Target Date  08/18/19      PT LONG TERM GOAL #4   Title  report decreased pain with standing to < 4/10 in order to improve standing tolerance    Status  New    Target Date  08/18/19            Plan - 07/19/19 0920    Clinical Impression Statement  Pt tolerated new strengthening exercises well, without pain, just fatigue.  Pt fatigued quickly with Rt hip abdct and Lt hip ext.  Overall reporting decrease of LE pain each visit. Progressing towards goals.    Personal Factors and Comorbidities  Comorbidity 3+    Comorbidities  bil uni knee replacement, OA, HTN    Examination-Activity Limitations  Stand;Stairs;Locomotion Level    Examination-Participation Restrictions  Tour manager   exercise   Stability/Clinical Decision Making  Evolving/Moderate complexity    Rehab Potential  Good    PT Frequency  2x / week    PT Duration  6 weeks    PT Treatment/Interventions  ADLs/Self Care Home Management;Cryotherapy;Electrical Stimulation;Moist Heat;Iontophoresis 4mg /ml Dexamethasone;Therapeutic exercise;Therapeutic activities;Functional mobility training;Stair training;Gait training;Ultrasound;Neuromuscular re-education;Patient/family education;Manual techniques;Taping;Dry needling    PT Next Visit Plan  review HEP, progress strengthening; DN/manual/modalities PRN;    PT Home Exercise Plan  Access Code: [email or web address]    Consulted and Agree with Plan of Care  Patient       Patient will benefit from skilled therapeutic intervention in order to improve the following deficits and impairments:  Pain, Postural dysfunction, Impaired flexibility, Decreased range of motion, Decreased mobility, Difficulty walking, Increased muscle spasms, Increased fascial restricitons, Decreased strength  Visit Diagnosis: Chronic pain of left knee  Chronic pain of right knee  Other symptoms and signs involving the musculoskeletal system     Problem List Patient Active Problem List   Diagnosis Date  Noted  . Anxiety with depression 09/09/2018  . Dry eye syndrome of both eyes 09/09/2018  . Microscopic hematuria 09/09/2018  . History of repair of hiatal hernia 09/09/2018  . Non-seasonal allergic rhinitis 09/09/2018  . History of palpitations 09/09/2018  . Mixed hyperlipidemia 09/09/2018  . Essential hypertension 09/09/2018  Kerin Perna, PTA 07/19/19 12:54 PM  [place], [place], [address] Phone: [phone]   Fax:  [phone]  Name: Melissa Rodgers MRN: [id number] Date of Birth: 1951-05-30

## 2019-07-19 NOTE — Patient Instructions (Signed)
Access Code: [email or web address]  URL: [email or web address]  Date: 07/19/2019  Prepared by: Kerin Perna   Exercises  Supine Bridge - 10 reps - 1 sets - 5 hold - 1x daily - 7x weekly  Sidelying Hip Abduction - 5-10 reps - 1 sets - 1x daily - 7x weekly  Eccentric Squat - 5-10 reps - 1 sets - 1x daily - 7x weekly  Standing Hip Extension with Counter Support - 10 reps - 1 sets - 1x daily - 7x weekly

## 2019-07-22 ENCOUNTER — Encounter: Payer: Medicare Other | Admitting: Physical Therapy

## 2019-07-27 ENCOUNTER — Encounter

## 2019-07-27 ENCOUNTER — Encounter: Payer: Self-pay | Admitting: Cardiology

## 2019-07-27 ENCOUNTER — Other Ambulatory Visit: Payer: Self-pay

## 2019-07-27 ENCOUNTER — Encounter: Payer: Self-pay | Admitting: Physical Therapy

## 2019-07-27 ENCOUNTER — Ambulatory Visit (INDEPENDENT_AMBULATORY_CARE_PROVIDER_SITE_OTHER): Payer: Medicare Other | Admitting: Physical Therapy

## 2019-07-27 ENCOUNTER — Encounter: Payer: Self-pay | Admitting: Family Medicine

## 2019-07-27 ENCOUNTER — Ambulatory Visit (INDEPENDENT_AMBULATORY_CARE_PROVIDER_SITE_OTHER): Payer: Medicare Other | Admitting: Cardiology

## 2019-07-27 VITALS — BP 128/90 | HR 56 | Ht 63.0 in | Wt 181.0 lb

## 2019-07-27 DIAGNOSIS — M25562 Pain in left knee: Secondary | ICD-10-CM

## 2019-07-27 DIAGNOSIS — Z7901 Long term (current) use of anticoagulants: Secondary | ICD-10-CM | POA: Diagnosis not present

## 2019-07-27 DIAGNOSIS — I10 Essential (primary) hypertension: Secondary | ICD-10-CM

## 2019-07-27 DIAGNOSIS — M25561 Pain in right knee: Secondary | ICD-10-CM | POA: Diagnosis not present

## 2019-07-27 DIAGNOSIS — I480 Paroxysmal atrial fibrillation: Secondary | ICD-10-CM

## 2019-07-27 DIAGNOSIS — M25551 Pain in right hip: Secondary | ICD-10-CM | POA: Diagnosis not present

## 2019-07-27 DIAGNOSIS — Z79899 Other long term (current) drug therapy: Secondary | ICD-10-CM

## 2019-07-27 DIAGNOSIS — R29898 Other symptoms and signs involving the musculoskeletal system: Secondary | ICD-10-CM

## 2019-07-27 DIAGNOSIS — G8929 Other chronic pain: Secondary | ICD-10-CM | POA: Diagnosis not present

## 2019-07-27 MED ORDER — APIXABAN 5 MG PO TABS: 5.0000 mg | ORAL_TABLET | Freq: Two times a day (BID) | ORAL | 3 refills | Status: DC

## 2019-07-27 MED ORDER — FLECAINIDE ACETATE 50 MG PO TABS: 50.0000 mg | ORAL_TABLET | Freq: Two times a day (BID) | ORAL | 3 refills | Status: DC

## 2019-07-27 NOTE — Therapy (Signed)
[facility] [place], [place], [address] Phone: [phone]   Fax:  [phone]  Physical Therapy Treatment  Patient Details  Name: Melissa Rodgers MRN: [id number] Date of Birth: [birth year] Referring Provider (PT): Gregor Hams, MD   Encounter Date: 07/27/2019  PT End of Session - 07/27/19 0927    Visit Number  5    Number of Visits  12    Date for PT Re-Evaluation  08/18/19    PT Start Time  0845    PT Stop Time  0926    PT Time Calculation (min)  41 min    Activity Tolerance  Patient tolerated treatment well    Behavior During Therapy  [facility] for tasks assessed/performed       Past Medical History:  Diagnosis Date  . Anxiety   . Arthritis   . Dyslipidemia   . GERD (gastroesophageal reflux disease)   . Hypertension     Past Surgical History:  Procedure Laterality Date  . HERNIA REPAIR    . MEDIAL PARTIAL KNEE REPLACEMENT Right 12/14/2014  . NISSEN FUNDOPLICATION  [id number]  . OOPHORECTOMY    . PARTIAL KNEE ARTHROPLASTY Left 04/05/2014    There were no vitals filed for this visit.  Subjective Assessment - 07/27/19 0848    Subjective  tape is helping the dull pain and exercises helping dull pain. still having persistent dull pain in Lt knee with walking.    Pertinent History  mild DJD; bil partial knee replacements (Lt 2015; Rt 2016)    Patient Stated Goals  improve pain, be able to continue to walk    Currently in Pain?  Yes    Pain Score  4     Pain Location  Knee    Pain Orientation  Right;Left    Pain Descriptors / Indicators  Dull    Pain Type  Chronic pain    Pain Onset  More than a month ago    Pain Frequency  Intermittent    Aggravating Factors   standing still to initial movement; late in day    Pain Relieving Factors  movement, voltaren gel                       [facility] Adult PT Treatment/Exercise - 07/27/19 0850      Knee/Hip Exercises: Stretches   Passive  Hamstring Stretch  Right;Left;20 seconds;3 reps   supine with strap   Other Knee/Hip Stretches  supine adductor stretch 3x30 sec bil      Knee/Hip Exercises: Aerobic   Recumbent Bike  L1: 6 min      Knee/Hip Exercises: Supine   Short Arc Quad Sets  Both;15 reps    Short Arc Quad Sets Limitations  2#      Iontophoresis   Type of Iontophoresis  Dexamethasone    Location  Lt pes anserine bursa    Dose  1.0 cc    Time  6 hour patch      Manual Therapy   Manual Therapy  Soft tissue mobilization    Soft tissue mobilization  IASTM and STM to Lt medial knee, adductor and distal hamstring, and Rt lateral knee and quad.   Improved tolerance to IASTM tool today                  PT Long Term Goals - 07/07/19 1649      PT LONG TERM GOAL #1   Title  independent  with HEP    Status  New    Target Date  08/18/19      PT LONG TERM GOAL #2   Title  improve FOTO score to </= 46% limited for improved function    Status  New    Target Date  08/18/19      PT LONG TERM GOAL #3   Title  report ability to walk 40 min without difficulty for improved functional tolerance    Status  New    Target Date  08/18/19      PT LONG TERM GOAL #4   Title  report decreased pain with standing to < 4/10 in order to improve standing tolerance    Status  New    Target Date  08/18/19            Plan - 07/27/19 0927    Clinical Impression Statement  Pt overall reporting decreased pain, but still with sharp pains with ambulating on Lt.  Ionto seems to help so reapplied patch today.  Slowly progressing towards goals.    Personal Factors and Comorbidities  Comorbidity 3+    Comorbidities  bil uni knee replacement, OA, HTN    Examination-Activity Limitations  Stand;Stairs;Locomotion Level    Examination-Participation Restrictions  Tour manager   exercise   Stability/Clinical Decision Making  Evolving/Moderate complexity    Rehab Potential  Good    PT Frequency  2x / week    PT Duration  6  weeks    PT Treatment/Interventions  ADLs/Self Care Home Management;Cryotherapy;Electrical Stimulation;Moist Heat;Iontophoresis 4mg /ml Dexamethasone;Therapeutic exercise;Therapeutic activities;Functional mobility training;Stair training;Gait training;Ultrasound;Neuromuscular re-education;Patient/family education;Manual techniques;Taping;Dry needling    PT Next Visit Plan  review HEP, progress strengthening; DN/manual/modalities PRN;    PT Home Exercise Plan  Access Code: [email or web address]    Consulted and Agree with Plan of Care  Patient       Patient will benefit from skilled therapeutic intervention in order to improve the following deficits and impairments:  Pain, Postural dysfunction, Impaired flexibility, Decreased range of motion, Decreased mobility, Difficulty walking, Increased muscle spasms, Increased fascial restricitons, Decreased strength  Visit Diagnosis: Chronic pain of left knee  Chronic pain of right knee  Other symptoms and signs involving the musculoskeletal system  Pain in right hip     Problem List Patient Active Problem List   Diagnosis Date Noted  . Anxiety with depression 09/09/2018  . Dry eye syndrome of both eyes 09/09/2018  . Microscopic hematuria 09/09/2018  . History of repair of hiatal hernia 09/09/2018  . Non-seasonal allergic rhinitis 09/09/2018  . History of palpitations 09/09/2018  . Mixed hyperlipidemia 09/09/2018  . Essential hypertension 09/09/2018      Laureen Abrahams, PT, DPT 07/27/19 9:28 AM     [facility] [place], [place], [address] Phone: [phone]   Fax:  [phone]  Name: Melissa Rodgers MRN: [id number] Date of Birth: 1951-02-25

## 2019-07-27 NOTE — Patient Instructions (Addendum)
Medication Instructions:  Your physician has recommended you make the following change in your medication:   STOP aspirin  START flecainide (tambocor) 50 mg: Take 1 tablet twice daily START apixaban (eliquis) 5 mg: Take 1 tablet twice daily  If you need a refill on your cardiac medications before your next appointment, please call your pharmacy.   Lab work: None  If you have labs (blood work) drawn today and your tests are completely normal, you will receive your results only by:  [place] (if you have MyChart) OR  A paper copy in the mail If you have any lab test that is abnormal or we need to change your treatment, we will call you to review the results.  Testing/Procedures: You had an EKG today.   You will be scheduled to come back to our office in 1 week for a repeat EKG after starting new medication. This will be scheduled at the check out desk.   Follow-Up: At [facility], you and your health needs are our priority.  As part of our continuing mission to provide you with exceptional heart care, we have created designated Provider Care Teams.  These Care Teams include your primary Cardiologist (physician) and Advanced Practice Providers (APPs -  Physician Assistants and Nurse Practitioners) who all work together to provide you with the care you need, when you need it. You will need a follow up appointment in 6 weeks.        [email or web address] [email or web address]        FDA-cleared, clinical grade mobile EKG monitor: Melissa Rodgers is the most clinically-validated mobile EKG used by the world's leading cardiac care medical professionals With Basic service, know instantly if your heart rhythm is normal or if atrial fibrillation is detected, and email the last single EKG recording to yourself or your doctor Premium service, available for purchase through the Kardia app for $9.99 per month or $99 per year, includes unlimited history and storage of  your EKG recordings, a monthly EKG summary report to share with your doctor, along with the ability to track your blood pressure, activity and weight Includes one KardiaMobile phone clip FREE SHIPPING: Standard delivery 1-3 business days. Orders placed by 11:00am PST will ship that afternoon. Otherwise, will ship next business day. All orders ship via [employer] from [place], [place]   Flecainide tablets What is this medicine? FLECAINIDE (FLEK a nide) is an antiarrhythmic drug. This medicine is used to prevent irregular heart rhythm. It can also slow down fast heartbeats called tachycardia. This medicine may be used for other purposes; ask your health care provider or pharmacist if you have questions. COMMON BRAND NAME(S): Tambocor What should I tell my health care provider before I take this medicine? They need to know if you have any of these conditions:  abnormal levels of potassium in the blood  heart disease including heart rhythm and heart rate problems  kidney or liver disease  recent heart attack  an unusual or allergic reaction to flecainide, local anesthetics, other medicines, foods, dyes, or preservatives  pregnant or trying to get pregnant  breast-feeding How should I use this medicine? Take this medicine by mouth with a glass of water. Follow the directions on the prescription label. You can take this medicine with or without food. Take your doses at regular intervals. Do not take your medicine more often than directed. Do not stop taking this medicine suddenly. This may cause serious, heart-related side effects. If your doctor wants you to stop the  medicine, the dose may be slowly lowered over time to avoid any side effects. Talk to your pediatrician regarding the use of this medicine in children. While this drug may be prescribed for children as young as 1 year of age for selected conditions, precautions do apply. Overdosage: If you think you have taken too much of this  medicine contact a poison control center or emergency room at once. NOTE: This medicine is only for you. Do not share this medicine with others. What if I miss a dose? If you miss a dose, take it as soon as you can. If it is almost time for your next dose, take only that dose. Do not take double or extra doses. What may interact with this medicine? Do not take this medicine with any of the following medications:  amoxapine  arsenic trioxide  certain antibiotics like clarithromycin, erythromycin, gatifloxacin, gemifloxacin, levofloxacin, moxifloxacin, sparfloxacin, or troleandomycin  certain antidepressants called tricyclic antidepressants like amitriptyline, imipramine, or nortriptyline  certain medicines to control heart rhythm like disopyramide, encainide, moricizine, procainamide, propafenone, and quinidine  cisapride  delavirdine  droperidol  haloperidol  hawthorn  imatinib  levomethadyl  maprotiline  medicines for malaria like chloroquine and halofantrine  pentamidine  phenothiazines like chlorpromazine, mesoridazine, prochlorperazine, thioridazine  pimozide  quinine  ranolazine  ritonavir  sertindole This medicine may also interact with the following medications:  cimetidine  dofetilide  medicines for angina or high blood pressure  medicines to control heart rhythm like amiodarone and digoxin  ziprasidone This list may not describe all possible interactions. Give your health care provider a list of all the medicines, herbs, non-prescription drugs, or dietary supplements you use. Also tell them if you smoke, drink alcohol, or use illegal drugs. Some items may interact with your medicine. What should I watch for while using this medicine? Visit your doctor or health care professional for regular checks on your progress. Because your condition and the use of this medicine carries some risk, it is a good idea to carry an identification card, necklace or  bracelet with details of your condition, medications and doctor or health care professional. Check your blood pressure and pulse rate regularly. Ask your health care professional what your blood pressure and pulse rate should be, and when you should contact him or her. Your doctor or health care professional also may schedule regular blood tests and electrocardiograms to check your progress. You may get drowsy or dizzy. Do not drive, use machinery, or do anything that needs mental alertness until you know how this medicine affects you. Do not stand or sit up quickly, especially if you are an older patient. This reduces the risk of dizzy or fainting spells. Alcohol can make you more dizzy, increase flushing and rapid heartbeats. Avoid alcoholic drinks. What side effects may I notice from receiving this medicine? Side effects that you should report to your doctor or health care professional as soon as possible:  chest pain, continued irregular heartbeats  difficulty breathing  swelling of the legs or feet  trembling, shaking  unusually weak or tired Side effects that usually do not require medical attention (report to your doctor or health care professional if they continue or are bothersome):  blurred vision  constipation  headache  nausea, vomiting  stomach pain This list may not describe all possible side effects. Call your doctor for medical advice about side effects. You may report side effects to FDA at 1-800-FDA-1088. Where should I keep my medicine? Keep out of the  reach of children. Store at room temperature between 15 and 30 degrees C (59 and 86 degrees F). Protect from light. Keep container tightly closed. Throw away any unused medicine after the expiration date. NOTE: This sheet is a summary. It may not cover all possible information. If you have questions about this medicine, talk to your doctor, pharmacist, or health care provider.  2020 Elsevier/Gold Standard (2018-11-01  11:41:38)    Apixaban oral tablets What is this medicine? APIXABAN (a PIX a ban) is an anticoagulant (blood thinner). It is used to lower the chance of stroke in people with a medical condition called atrial fibrillation. It is also used to treat or prevent blood clots in the lungs or in the veins. This medicine may be used for other purposes; ask your health care provider or pharmacist if you have questions. COMMON BRAND NAME(S): Eliquis What should I tell my health care provider before I take this medicine? They need to know if you have any of these conditions:  antiphospholipid antibody syndrome  bleeding disorders  bleeding in the brain  blood in your stools (black or tarry stools) or if you have blood in your vomit  history of blood clots  history of stomach bleeding  kidney disease  liver disease  mechanical heart valve  an unusual or allergic reaction to apixaban, other medicines, foods, dyes, or preservatives  pregnant or trying to get pregnant  breast-feeding How should I use this medicine? Take this medicine by mouth with a glass of water. Follow the directions on the prescription label. You can take it with or without food. If it upsets your stomach, take it with food. Take your medicine at regular intervals. Do not take it more often than directed. Do not stop taking except on your doctor's advice. Stopping this medicine may increase your risk of a blood clot. Be sure to refill your prescription before you run out of medicine. Talk to your pediatrician regarding the use of this medicine in children. Special care may be needed. Overdosage: If you think you have taken too much of this medicine contact a poison control center or emergency room at once. NOTE: This medicine is only for you. Do not share this medicine with others. What if I miss a dose? If you miss a dose, take it as soon as you can. If it is almost time for your next dose, take only that dose. Do not  take double or extra doses. What may interact with this medicine? This medicine may interact with the following:  aspirin and aspirin-like medicines  certain medicines for fungal infections like ketoconazole and itraconazole  certain medicines for seizures like carbamazepine and phenytoin  certain medicines that treat or prevent blood clots like warfarin, enoxaparin, and dalteparin  clarithromycin  NSAIDs, medicines for pain and inflammation, like ibuprofen or naproxen  rifampin  ritonavir  St. John's wort This list may not describe all possible interactions. Give your health care provider a list of all the medicines, herbs, non-prescription drugs, or dietary supplements you use. Also tell them if you smoke, drink alcohol, or use illegal drugs. Some items may interact with your medicine. What should I watch for while using this medicine? Visit your healthcare professional for regular checks on your progress. You may need blood work done while you are taking this medicine. Your condition will be monitored carefully while you are receiving this medicine. It is important not to miss any appointments. Avoid sports and activities that might cause injury while  you are using this medicine. Severe falls or injuries can cause unseen bleeding. Be careful when using sharp tools or knives. Consider using an Copy. Take special care brushing or flossing your teeth. Report any injuries, bruising, or Rodgers spots on the skin to your healthcare professional. If you are going to need surgery or other procedure, tell your healthcare professional that you are taking this medicine. Wear a medical ID bracelet or chain. Carry a card that describes your disease and details of your medicine and dosage times. What side effects may I notice from receiving this medicine? Side effects that you should report to your doctor or health care professional as soon as possible:  allergic reactions like skin rash,  itching or hives, swelling of the face, lips, or tongue  signs and symptoms of bleeding such as bloody or black, tarry stools; Rodgers or dark-brown urine; spitting up blood or brown material that looks like coffee grounds; Rodgers spots on the skin; unusual bruising or bleeding from the eye, gums, or nose  signs and symptoms of a blood clot such as chest pain; shortness of breath; pain, swelling, or warmth in the leg  signs and symptoms of a stroke such as changes in vision; confusion; trouble speaking or understanding; severe headaches; sudden numbness or weakness of the face, arm or leg; trouble walking; dizziness; loss of coordination This list may not describe all possible side effects. Call your doctor for medical advice about side effects. You may report side effects to FDA at 1-800-FDA-1088. Where should I keep my medicine? Keep out of the reach of children. Store at room temperature between 20 and 25 degrees C (68 and 77 degrees F). Throw away any unused medicine after the expiration date. NOTE: This sheet is a summary. It may not cover all possible information. If you have questions about this medicine, talk to your doctor, pharmacist, or health care provider.  2020 Elsevier/Gold Standard (2018-07-21 17:39:34)

## 2019-07-27 NOTE — Progress Notes (Signed)
Cardiology Office Note:    Date:  07/27/2019   ID:  Melissa Rodgers, DOB [birth year], MRN [id number]  PCP:  [name], DO  Cardiologist:  Shirlee More, MD    Referring MD: [name], DO    ASSESSMENT:    1. PAF (paroxysmal atrial fibrillation) ([place])   2. Essential hypertension   3. High risk medication use   4. Chronic anticoagulation    PLAN:    In order of problems listed above:  1. A recent episode of documented atrial fibrillation with fortunately no clinical recurrence.  She has substrate including frequent atrial and ventricular premature contractions and likely from her description of the episode breaking with Valsalva maneuver atrial tachycardia.  She is at high risk of recurrent persistent atrial fibrillation after discussion of the options will initiate low risk antiarrhythmic therapy with flecainide and anticoagulation.  She will stop aspirin.  She will return in 1 week for an EKG and brief office visit with Laurann Montana nurse practitioner and will see me in the office in 6 to 8 weeks.  She will screen heart rhythm at home with the iPhone adapter and in the absence of recurrent atrial fibrillation will discuss withdrawal of anticoagulants at that time with a single isolated brief episode of documented atrial fibrillation.  She has mild snoring no other symptoms and does not require a sleep study.  Thyroid test are normal. 2. Stable hypertension continue current treatment including ARB 3. Low-dose flecainide check EKG 1 week for toxicity concern if QRS duration increases more than 25% 4. Moderate stroke risk initiate anticoagulation   Next appointment: 6-8 weeks   Medication Adjustments/Labs and Tests Ordered: Current medicines are reviewed at length with the patient today.  Concerns regarding medicines are outlined above.  Orders Placed This Encounter  Procedures   EKG 12-Lead   Meds ordered this encounter  Medications   flecainide (TAMBOCOR) 50 MG  tablet    Sig: Take 1 tablet (50 mg total) by mouth 2 (two) times daily.    Dispense:  60 tablet    Refill:  3   apixaban (ELIQUIS) 5 MG TABS tablet    Sig: Take 1 tablet (5 mg total) by mouth 2 (two) times daily.    Dispense:  60 tablet    Refill:  3    Chief Complaint  Patient presents with   Other    Afib. Meds reviewed verbally with pt.    History of Present Illness:    Melissa Rodgers is a 68 y.o. female with a hx of hypertension symptomatic APCs and PVCs referred by Dr. Sheppard Coil for atrial fibrillation after recent [facility] [place] visit 06/18/2019. Compliance with diet, lifestyle and medications: Yes  In 2019 at the [facility] or had spent time in my medical training she had a cardiac CTA with no CAD calcium score of 0.  She had an echocardiogram 2017 with normal left and right ventricular function normal atrial size and no significant valvular regurgitation.  She has a long history of palpitations symptomatic APCs and PVCs has been taking long-acting propranolol.  She has intermittent episodes but never severe or sustained.  On July 25 she had sustained rapid heart rhythm was found to be in atrial fibrillation and fortunately converted spontaneously with IV Cardizem for rate control.  I reviewed those EKGs and she was in atrial fibrillation rate 140 to 150 bpm and the second EKG showed sinus rhythm was normal.  Few days later she had a brief  episode at night that broke with the Valsalva maneuver.  Since then she has had no recurrence.  Total duration of atrial fibrillation was in the range of 2 hours and anticoagulation has not been recommended.  Her stroke risk is moderate with hypertension age and female sex and after review of benefits risks and options she elects to accept anticoagulation over the next 6 to 8 weeks while we assess her response to antiarrhythmic drug therapy with flecainide.  She will return the office in 1 week for an EKG will purchase the  iPhone adapter and screen her heart rhythm at home and will follow-up in the office in 6 to 8 weeks and she has had no clinical recurrence it would not be unreasonable to take aspirin and avoid anticoagulation with isolated brief atrial fibrillation single episode.  She has no chest discomfort or shortness of breath and does not require repeat ischemic evaluation.  We discussed the potential of repeating an echocardiogram and she declines.  In the emergency room her TSH CBC and CMP were all normal.  During the visit I accessed and reviewed the results of the records from [place] including EKGs labs and details the emergency room visit as well as the [facility] cardiac CTA Holter monitor as they performed an echocardiogram with the patient. Past Medical History:  Diagnosis Date   Anxiety    Arthritis    Dyslipidemia    GERD (gastroesophageal reflux disease)    Hypertension     Past Surgical History:  Procedure Laterality Date   HERNIA REPAIR     MEDIAL PARTIAL KNEE REPLACEMENT Right [id number]   NISSEN FUNDOPLICATION  [id number]   OOPHORECTOMY     PARTIAL KNEE ARTHROPLASTY Left 04/05/2014    Current Medications: Current Meds  Medication Sig   Cetirizine HCl 10 MG CAPS Take by mouth.   Cholecalciferol (VITAMIN D-3) 1000 units CAPS Take by mouth.   clonazePAM (KLONOPIN) 0.5 MG tablet Take 1 tablet (0.5 mg total) by mouth 2 (two) times daily as needed for anxiety.   Coenzyme Q10 (CO Q 10 PO) Take by mouth daily.   Cyanocobalamin (B-12 PO) Take by mouth every other day.   diclofenac sodium (VOLTAREN) 1 % GEL Apply 4 g topically 4 (four) times daily. To affected joint.   FLUoxetine (PROZAC) 10 MG tablet Take 0.5 tablets (5 mg total) by mouth daily.   losartan (COZAAR) 50 MG tablet Take 0.5 tablets (25 mg total) by mouth daily.   LYSINE PO Take by mouth daily.   MAGNESIUM PO Take 0.5 mg by mouth every other day.   propranolol ER (INDERAL LA) 60 MG 24 hr  capsule Take 1 capsule (60 mg total) by mouth daily.     Allergies:   Sulfa antibiotics and Rosuvastatin   Social History   Socioeconomic History   Marital status: Married    Spouse name: Not on file   Number of children: 0   Years of education: Not on file   Highest education level: Not on file  Occupational History   Not on file  Social Needs   Financial resource strain: Not on file   Food insecurity    Worry: Not on file    Inability: Not on file   Transportation needs    Medical: Not on file    Non-medical: Not on file  Tobacco Use   Smoking status: Former Smoker    Quit date: 08/18/1983    Years since quitting: 35.9   Smokeless tobacco:  Never Used  Substance and Sexual Activity   Alcohol use: Yes    Comment: 7 q wk   Drug use: Never   Sexual activity: Yes    Partners: Male    Birth control/protection: None  Lifestyle   Physical activity    Days per week: Not on file    Minutes per session: Not on file   Stress: Not on file  Relationships   Social connections    Talks on phone: Not on file    Gets together: Not on file    Attends religious service: Not on file    Active member of club or organization: Not on file    Attends meetings of clubs or organizations: Not on file    Relationship status: Not on file  Other Topics Concern   Not on file  Social History Narrative   Not on file     Family History: The patient's family history includes Alzheimer's disease in her father; Atrial fibrillation in her sister; Diabetes in her mother; Heart disease in her mother. ROS:  Review of Systems  Constitution: Negative.  HENT: Negative.   Eyes: Negative.   Cardiovascular: Positive for palpitations.  Respiratory: Positive for snoring.   Endocrine: Negative.   Hematologic/Lymphatic: Negative.   Skin: Negative.   Musculoskeletal: Negative.   Gastrointestinal: Negative.   Genitourinary: Negative.   Neurological: Negative.     Psychiatric/Behavioral: Negative.   Allergic/Immunologic: Negative.    Please see the history of present illness.    All other systems reviewed and are negative.  EKGs/Labs/Other Studies Reviewed:    The following studies were reviewed today:  EKG:  EKG ordered today and personally reviewed.  The ekg ordered today demonstrates sinus rhythm normal  Recent Labs: 09/09/2018: ALT 18; BUN 16; Creat 0.59; Hemoglobin 14.1; Platelets 281; Potassium 4.3; Sodium 139; TSH 1.71    Physical Exam:    VS:  BP 128/90 (BP Location: Left Arm, Patient Position: Sitting, Cuff Size: Normal)    Pulse (!) 56    Ht 5\' 3"  (1.6 m)    Wt 181 lb (82.1 kg)    SpO2 99%    BMI 32.06 kg/m     Wt Readings from Last 3 Encounters:  07/27/19 181 lb (82.1 kg)  06/29/19 183 lb (83 kg)  06/18/19 184 lb 8 oz (83.7 kg)     GEN:  Well nourished, well developed in no acute distress HEENT: Normal NECK: No JVD; No carotid bruits LYMPHATICS: No lymphadenopathy CARDIAC: RRR, no murmurs, rubs, gallops RESPIRATORY:  Clear to auscultation without rales, wheezing or rhonchi  ABDOMEN: Soft, non-tender, non-distended MUSCULOSKELETAL:  No edema; No deformity  SKIN: Warm and dry NEUROLOGIC:  Alert and oriented x 3 PSYCHIATRIC:  Normal affect    Signed, Shirlee More, MD  07/27/2019 11:57 AM    [place]

## 2019-07-29 ENCOUNTER — Other Ambulatory Visit: Payer: Self-pay

## 2019-07-29 ENCOUNTER — Ambulatory Visit (INDEPENDENT_AMBULATORY_CARE_PROVIDER_SITE_OTHER): Payer: Medicare Other | Admitting: Physical Therapy

## 2019-07-29 DIAGNOSIS — G8929 Other chronic pain: Secondary | ICD-10-CM

## 2019-07-29 DIAGNOSIS — R29898 Other symptoms and signs involving the musculoskeletal system: Secondary | ICD-10-CM

## 2019-07-29 DIAGNOSIS — M25562 Pain in left knee: Secondary | ICD-10-CM | POA: Diagnosis not present

## 2019-07-29 DIAGNOSIS — M25551 Pain in right hip: Secondary | ICD-10-CM | POA: Diagnosis not present

## 2019-07-29 DIAGNOSIS — M25561 Pain in right knee: Secondary | ICD-10-CM

## 2019-07-29 NOTE — Therapy (Addendum)
[facility] [place], [place], [address] Phone: [phone]   Fax:  [phone]  Physical Therapy Treatment/Discharge Summary  Patient Details  Name: Melissa Rodgers MRN: [id number] Date of Birth: 24-Sep-1951 Referring Provider (PT): Gregor Hams, MD   Encounter Date: 07/29/2019  PT End of Session - 07/29/19 0931    Visit Number  6    Number of Visits  12    Date for PT Re-Evaluation  08/18/19    PT Start Time  0849    PT Stop Time  0930    PT Time Calculation (min)  41 min    Activity Tolerance  Patient tolerated treatment well    Behavior During Therapy  [facility] for tasks assessed/performed       Past Medical History:  Diagnosis Date  . Anxiety   . Arthritis   . Dyslipidemia   . GERD (gastroesophageal reflux disease)   . Hypertension     Past Surgical History:  Procedure Laterality Date  . HERNIA REPAIR    . MEDIAL PARTIAL KNEE REPLACEMENT Right 12/14/2014  . NISSEN FUNDOPLICATION  68/34/1962  . OOPHORECTOMY    . PARTIAL KNEE ARTHROPLASTY Left 04/05/2014    There were no vitals filed for this visit.  Subjective Assessment - 07/29/19 0853    Subjective  The hip is better is gone.  Knee pain is about the same.  Would like to be taped again.  The knee itched intensly where ionto patch was placed; removed after 10 min- she tried to reapply an hour later.    Pertinent History  mild DJD; bil partial knee replacements (Lt 2015; Rt 2016)    Patient Stated Goals  improve pain, be able to continue to walk    Currently in Pain?  Yes    Pain Score  3     Pain Location  Knee    Pain Orientation  Left;Right    Pain Descriptors / Indicators  Dull         [facility] PT Assessment - 07/29/19 0001      Assessment   Medical Diagnosis  M25.561,M25.562,G89.29 (ICD-10-CM) - Chronic pain of both knees; M25.551 (ICD-10-CM) - Right hip pain    Referring Provider (PT)  Gregor Hams, MD    Onset Date/Surgical Date  --    chronic   Next MD Visit  08/10/2019    Prior Therapy  following knee surgeries       [facility] Adult PT Treatment/Exercise - 07/29/19 0001      Knee/Hip Exercises: Stretches   Passive Hamstring Stretch  Right;Left;3 reps;30 seconds   seated   Quad Stretch  Right;Left;2 reps;30 seconds   seated with foot under chair    Piriformis Stretch  Right;Left;2 reps;20 seconds   supine   Gastroc Stretch  Both;2 reps    Soleus Stretch  Left;3 reps;20 seconds    Soleus Stretch Limitations  trial of seated with strap, standing, and standing with heel off of step.  heel off of step preferred.       Knee/Hip Exercises: Aerobic   Nustep  L4: 6 min       Knee/Hip Exercises: Seated   Sit to Sand  1 set;without UE support;5 reps      Knee/Hip Exercises: Sidelying   Hip ABduction  Strengthening;Right;Left;1 set;10 reps    Clams  Rt x 10       Iontophoresis   Type of Iontophoresis  --   declined; requested tape instead  Manual Therapy   Manual therapy comments  pt prone for STM.  Sensitive skin tape applied to Lt knee medial/ lateral and x on prox medial tibia (below pes anserine) with 20% stretch to decmpress tissue and decrease pain.     Soft tissue mobilization  IASTM and STM to Lt medial knee, adductor, hamstring, prox soleus.                      PT Long Term Goals - 07/07/19 1649      PT LONG TERM GOAL #1   Title  independent with HEP    Status  New    Target Date  08/18/19      PT LONG TERM GOAL #2   Title  improve FOTO score to </= 46% limited for improved function    Status  New    Target Date  08/18/19      PT LONG TERM GOAL #3   Title  report ability to walk 40 min without difficulty for improved functional tolerance    Status  New    Target Date  08/18/19      PT LONG TERM GOAL #4   Title  report decreased pain with standing to < 4/10 in order to improve standing tolerance    Status  New    Target Date  08/18/19            Plan - 07/29/19 0910     Clinical Impression Statement  Pt reporting greater ease with descending stairs and overall decrease in hip pain.  Pt able to complete 10 reps of hip abdct now, with less pain and fatigue. Pt very point tender along prox Lt post tib with manual therapy.  Added solus stretch to HEP.  Progressing towards goals.    Personal Factors and Comorbidities  Comorbidity 3+    Comorbidities  bil uni knee replacement, OA, HTN    Examination-Activity Limitations  Stand;Stairs;Locomotion Level    Examination-Participation Restrictions  Tour manager   exercise   Stability/Clinical Decision Making  Evolving/Moderate complexity    Rehab Potential  Good    PT Frequency  2x / week    PT Duration  6 weeks    PT Treatment/Interventions  ADLs/Self Care Home Management;Cryotherapy;Electrical Stimulation;Moist Heat;Iontophoresis 55m/ml Dexamethasone;Therapeutic exercise;Therapeutic activities;Functional mobility training;Stair training;Gait training;Ultrasound;Neuromuscular re-education;Patient/family education;Manual techniques;Taping;Dry needling    PT Next Visit Plan  review HEP, progress strengthening; DN/manual/modalities PRN;    PT Home Exercise Plan  Access Code: [email or web address]    Consulted and Agree with Plan of Care  Patient       Patient will benefit from skilled therapeutic intervention in order to improve the following deficits and impairments:  Pain, Postural dysfunction, Impaired flexibility, Decreased range of motion, Decreased mobility, Difficulty walking, Increased muscle spasms, Increased fascial restricitons, Decreased strength  Visit Diagnosis: Chronic pain of left knee  Chronic pain of right knee  Other symptoms and signs involving the musculoskeletal system  Pain in right hip     Problem List Patient Active Problem List   Diagnosis Date Noted  . Anxiety with depression 09/09/2018  . Dry eye syndrome of both eyes 09/09/2018  . Microscopic hematuria 09/09/2018  . History of  repair of hiatal hernia 09/09/2018  . Non-seasonal allergic rhinitis 09/09/2018  . History of palpitations 09/09/2018  . Mixed hyperlipidemia 09/09/2018  . Essential hypertension 09/09/2018   JKerin Perna PTA 07/29/19 1:07 PM  [id number]  [place], [place], [address] Phone: [phone]   Fax:  [phone]  Name: Melissa Rodgers MRN: [id number] Date of Birth: 1951/10/03     PHYSICAL THERAPY DISCHARGE SUMMARY  Visits from Start of Care: 6  Current functional level related to goals / functional outcomes: See above   Remaining deficits: See above   Education / Equipment: HEP  Plan: Patient agrees to discharge.  Patient goals were not met. Patient is being discharged due to not returning since the last visit.  ?????    Pt now scheduled for TKA. Laureen Abrahams, PT, DPT 10/24/19 9:55 AM  [facility] [address], [place] [address]  [phone] (office) [phone] (fax)

## 2019-07-30 ENCOUNTER — Encounter: Payer: Self-pay | Admitting: Cardiology

## 2019-07-31 DIAGNOSIS — I4891 Unspecified atrial fibrillation: Secondary | ICD-10-CM | POA: Diagnosis not present

## 2019-07-31 DIAGNOSIS — Z882 Allergy status to sulfonamides status: Secondary | ICD-10-CM | POA: Diagnosis not present

## 2019-07-31 DIAGNOSIS — F410 Panic disorder [episodic paroxysmal anxiety] without agoraphobia: Secondary | ICD-10-CM | POA: Diagnosis not present

## 2019-07-31 DIAGNOSIS — K5732 Diverticulitis of large intestine without perforation or abscess without bleeding: Secondary | ICD-10-CM | POA: Diagnosis not present

## 2019-07-31 DIAGNOSIS — K219 Gastro-esophageal reflux disease without esophagitis: Secondary | ICD-10-CM | POA: Diagnosis not present

## 2019-07-31 DIAGNOSIS — Z888 Allergy status to other drugs, medicaments and biological substances status: Secondary | ICD-10-CM | POA: Diagnosis not present

## 2019-07-31 DIAGNOSIS — E785 Hyperlipidemia, unspecified: Secondary | ICD-10-CM | POA: Diagnosis not present

## 2019-07-31 DIAGNOSIS — R109 Unspecified abdominal pain: Secondary | ICD-10-CM | POA: Diagnosis not present

## 2019-07-31 DIAGNOSIS — M170 Bilateral primary osteoarthritis of knee: Secondary | ICD-10-CM | POA: Diagnosis not present

## 2019-07-31 DIAGNOSIS — M797 Fibromyalgia: Secondary | ICD-10-CM | POA: Diagnosis not present

## 2019-07-31 DIAGNOSIS — I10 Essential (primary) hypertension: Secondary | ICD-10-CM | POA: Diagnosis not present

## 2019-07-31 DIAGNOSIS — Z7901 Long term (current) use of anticoagulants: Secondary | ICD-10-CM | POA: Diagnosis not present

## 2019-07-31 DIAGNOSIS — Z79899 Other long term (current) drug therapy: Secondary | ICD-10-CM | POA: Diagnosis not present

## 2019-07-31 DIAGNOSIS — F419 Anxiety disorder, unspecified: Secondary | ICD-10-CM | POA: Diagnosis not present

## 2019-07-31 DIAGNOSIS — R10813 Right lower quadrant abdominal tenderness: Secondary | ICD-10-CM | POA: Diagnosis not present

## 2019-07-31 DIAGNOSIS — Z87891 Personal history of nicotine dependence: Secondary | ICD-10-CM | POA: Diagnosis not present

## 2019-07-31 DIAGNOSIS — R110 Nausea: Secondary | ICD-10-CM | POA: Diagnosis not present

## 2019-07-31 NOTE — Progress Notes (Signed)
Office Visit    Patient Name: Melissa Rodgers Date of Encounter: 08/03/2019  Primary Care Provider:  [name], DO Primary Cardiologist:  Shirlee More, MD  Chief Complaint    68 year old female with past medical history of hypertension, PVC, PAC, PAF on flecainide and anticoagulation presents today for one-week follow-up after initiation of flecainide and anticoagulation.  Past Medical History    Past Medical History:  Diagnosis Date  . Anxiety   . Arthritis   . Dyslipidemia   . GERD (gastroesophageal reflux disease)   . Hypertension   . Subarachnoid hemorrhage following injury [facility])    Past Surgical History:  Procedure Laterality Date  . HERNIA REPAIR    . MEDIAL PARTIAL KNEE REPLACEMENT Right 12/14/2014  . NISSEN FUNDOPLICATION  [id number]  . OOPHORECTOMY    . PARTIAL KNEE ARTHROPLASTY Left 04/05/2014   Allergies  Allergies  Allergen Reactions  . Sulfa Antibiotics Rash  . Rosuvastatin     Other reaction(s): Musculoskeletal Aches    History of Present Illness    Melissa Rodgers is a 68 y.o. female with a hx of HTN, symptomatic PACs and PVCs, atrial fibrillation last seen by Dr. Bettina Gavia 07/27/2019.  She presents today for one-week follow-up.  2010 horseback riding accident with small subarachnoid hemorrhage.   Echo 2017 with normal LV and RV function, normal atrial size, no significant valvular regurgitation.  2019 she was seen at the [place] [facility] in [place] had cardiac CTA with no CAD and calcium score of 0.  Longstanding history of symptomatic PACs and PVCs managed on long-acting propanolol.  06/18/2019 sustained rapid heart rhythm found to be in atrial fibrillation and converted spontaneously with IV Cardizem while at [facility].  Duration of atrial fibrillation approximately 2 hours. Few days later had brief episode at night that broke with Valsalva maneuver.  At her last episode with Dr. Bettina Gavia she was initiated on flecainide and anticoagulation (CHADS2VASC3) to  assess her response.  She was admitted to [facility] 07/31/19 for diverticulitis and discharged 08/02/19. CT abdomen showed severe acute sigmoid diverticulitis without abscess. Per report her EKG 08/01/19 was SR 78 bpm.   Checks her blood pressure regularly at home with readings 100s-120s/70s-80s. She denies dizziness, near syncope. She reports "very rare" episodes of lightheadedness with position changes. We discussed orthostatic hypotension.   Tells me she sometimes feels her heart "buzzing" at night when her heart rate is up to 80bpm. She has purchased [employer] and I encouraged her to utilize during these episodes. Tells me it feels different than the PAF.   Denies bleeding complications on Eliquis and verbalizes understanding of need for anticoagulation in setting of PAF. We discussed triggers for PAF such as stress or illness.   EKGs/Labs/Other Studies Reviewed:   The following studies were reviewed today:  EKG:  EKG is  ordered today.  The ekg ordered today demonstrates SB rate 58bpm with narrow QRS and no acute ST/T wave changes.   Recent Labs: 09/09/2018: ALT 18; BUN 16; Creat 0.59; Hemoglobin 14.1; Platelets 281; Potassium 4.3; Sodium 139; TSH 1.71  Recent Lipid Panel No results found for: CHOL, TRIG, HDL, CHOLHDL, VLDL, LDLCALC, LDLDIRECT  Home Medications   Current Meds  Medication Sig  . apixaban (ELIQUIS) 5 MG TABS tablet Take 1 tablet (5 mg total) by mouth 2 (two) times daily.  . Cetirizine HCl 10 MG CAPS Take by mouth.  . Cholecalciferol (VITAMIN D-3) 1000 units CAPS Take by mouth.  . clonazePAM (KLONOPIN) 0.5 MG tablet Take 1 tablet (  0.5 mg total) by mouth 2 (two) times daily as needed for anxiety.  . Coenzyme Q10 (CO Q 10 PO) Take by mouth daily.  . Cyanocobalamin (B-12 PO) Take by mouth every other day.  . diclofenac sodium (VOLTAREN) 1 % GEL Apply 4 g topically 4 (four) times daily. To affected joint.  . flecainide (TAMBOCOR) 50 MG tablet Take 1 tablet (50 mg total) by mouth 2  (two) times daily.  Marland Kitchen FLUoxetine (PROZAC) 10 MG tablet Take 0.5 tablets (5 mg total) by mouth daily.  Marland Kitchen losartan (COZAAR) 50 MG tablet Take 0.5 tablets (25 mg total) by mouth daily.  Marland Kitchen LYSINE PO Take by mouth daily.  Marland Kitchen MAGNESIUM PO Take 0.5 mg by mouth every other day.  . propranolol ER (INDERAL LA) 60 MG 24 hr capsule Take 1 capsule (60 mg total) by mouth daily.      Review of Systems   Review of Systems  Constitution: Negative for chills, fever and malaise/fatigue.  Cardiovascular: Negative for chest pain, dyspnea on exertion, irregular heartbeat, near-syncope and palpitations.  Respiratory: Negative for cough, shortness of breath and wheezing.   Gastrointestinal: Negative for nausea and vomiting.  Neurological: Positive for light-headedness ("very rarely"). Negative for dizziness and weakness.   All other systems reviewed and are otherwise negative except as noted above.  Physical Exam    VS:  BP 110/60 (BP Location: Right Arm, Patient Position: Sitting, Cuff Size: Normal)   Pulse (!) 58   Ht 5\' 3"  (1.6 m)   Wt 181 lb (82.1 kg)   SpO2 96%   BMI 32.06 kg/m  , BMI Body mass index is 32.06 kg/m. GEN: Well nourished, well developed, in no acute distress. HEENT: normal. Neck: Supple, no JVD, carotid bruits, or masses. Cardiac: RRR, no murmurs, rubs, or gallops. No clubbing, cyanosis, edema.  Radials/DP/PT 2+ and equal bilaterally.  Respiratory:  Respirations regular and unlabored, clear to auscultation bilaterally. GI: Soft, nontender, nondistended, BS + x 4. MS: No deformity or atrophy. Skin: Warm and dry, no rash. Neuro:  Strength and sensation are intact. Psych: Normal affect.  Accessory Clinical Findings    CT chest coronary angiography 01/24/18 Impression: 1. Zero calcium score 2. Normal coronaries.   INTERPRETATION: 1. This is an adequate-quality 24-hour Holter recording for palpitations.  2. The maximum heart rate was 122 beats per minute with a minimum of 48  beats  per minute and average of 76 beats per minute.  3. There were 10% of the total beats in tachycardia and 7% of the total beats  in bradycardia, with no significant balls or blocks.  4. There was less than 1% ventricular ectopy with 662 isolated, four couplets,  and no runs.  5. There was less than 1% supraventricular ectopy with 49 isolated, one  couplet, and two runs totaling 13 beats, with the longest of 10 beats at 98  beats per minute and the fastest of three beats at 99 beats per minute. These  occurred in the middle of the night/early morning and were not associated with  symptoms.  6.  7. There were 11 patient-triggered events, each without any documented  symptoms. Each was independently reviewed. Nine of the 11 events were  associated with normal sinus rhythm only. Two of the patient-triggered events  were associated with isolated premature ventricular contractions and  underlying normal sinus rhythm.   SUMMARY: Less than 1% ventricular and supraventricular ectopy with short runs  of atrial tachycardia in the early morning, without documented symptoms.  Symptoms primarily correlate with normal sinus rhythm only, with occasional  isolated premature ventricular contraction.   Holter Monitor 12/2017  INTERPRETATION: 1. This is an adequate-quality 24-hour Holter recording for palpitations.  2. The maximum heart rate was 122 beats per minute with a minimum of 48 beats  per minute and average of 76 beats per minute.  3. There were 10% of the total beats in tachycardia and 7% of the total beats  in bradycardia, with no significant balls or blocks.  4. There was less than 1% ventricular ectopy with 662 isolated, four couplets,  and no runs.  5. There was less than 1% supraventricular ectopy with 49 isolated, one  couplet, and two runs totaling 13 beats, with the longest of 10 beats at 98  beats per minute and the fastest of three beats at 99 beats per minute. These  occurred  in the middle of the night/early morning and were not associated with  symptoms.  6.  7. There were 11 patient-triggered events, each without any documented  symptoms. Each was independently reviewed. Nine of the 11 events were  associated with normal sinus rhythm only. Two of the patient-triggered events  were associated with isolated premature ventricular contractions and  underlying normal sinus rhythm.   SUMMARY: Less than 1% ventricular and supraventricular ectopy with short runs  of atrial tachycardia in the early morning, without documented symptoms.  Symptoms primarily correlate with normal sinus rhythm only, with occasional  isolated premature ventricular contraction.   ECG personally reviewed by me today - SB rate 58 bpm with narrow QRS and no acute ST/T wave changes - no acute changes.  Assessment & Plan    1. PAF- Approximately 2-hour episode 06/18/2019 while at [facility].  TSH 2.6 on 06/18/2019. Denies recurrence during her recent admission 07/31/19-08/02/19 for diverticulitis. Purchased Jodelle Red - she has recorded 2 EKGs both of which were SR. Continue flecainide, beta blocker, and Eliquis.   2. High risk medication use- Flecainide in the setting of PAF.  EKG today shows SB rate 58 bpm with narrow QRS of 64ms - she is on propanolol extended release for rate limiting medication in the setting of flecainide therapy.   3. Chronic anticoagulation- In setting of PAF with CHADS2VASC of 3 (female, greater than 65, HTN).  Due to history of subarachnoid hemorrhage after horseback riding incident in 2010 we will plan to get baseline CT brain in setting of anticoagulant.Continue Eliquis 5mg  BID.   4. HTN- BP well controlled. Checks routinely at home with BP 100s-120s/70s-80s. Denies dizziness. Reports intermittent lightheadedness with sudden position changes. Education provided on orthostatic hypotension.   5. PVC/PAC- Longstanding history.  Presently managed on long-acting propranolol.   6.  Sinus bradycardia - Noted on exam and history. Denies dizziness, near syncope. Has "very rare" episode of light headedness with position changes. No indication for adjustment of beta blocker at this time.   Disposition: Follow-up with Dr. Lemar Livings in 6 weeks as previously scheduled.  Loel Dubonnet, NP 08/03/2019, 2:28 PM

## 2019-08-01 DIAGNOSIS — K5732 Diverticulitis of large intestine without perforation or abscess without bleeding: Secondary | ICD-10-CM | POA: Diagnosis not present

## 2019-08-01 DIAGNOSIS — F410 Panic disorder [episodic paroxysmal anxiety] without agoraphobia: Secondary | ICD-10-CM | POA: Diagnosis not present

## 2019-08-01 DIAGNOSIS — I10 Essential (primary) hypertension: Secondary | ICD-10-CM | POA: Diagnosis not present

## 2019-08-01 DIAGNOSIS — K5792 Diverticulitis of intestine, part unspecified, without perforation or abscess without bleeding: Secondary | ICD-10-CM | POA: Insufficient documentation

## 2019-08-01 DIAGNOSIS — F411 Generalized anxiety disorder: Secondary | ICD-10-CM | POA: Insufficient documentation

## 2019-08-01 DIAGNOSIS — I4891 Unspecified atrial fibrillation: Secondary | ICD-10-CM | POA: Diagnosis not present

## 2019-08-01 HISTORY — DX: Panic disorder (episodic paroxysmal anxiety): F41.0

## 2019-08-01 HISTORY — DX: Diverticulitis of large intestine without perforation or abscess without bleeding: K57.32

## 2019-08-02 MED ORDER — BARO-CAT PO
100.00 | ORAL | Status: DC
Start: ? — End: 2019-08-02

## 2019-08-02 MED ORDER — Medication
Status: DC
Start: ? — End: 2019-08-02

## 2019-08-02 MED ORDER — Medication
60.00 | Status: DC
Start: ? — End: 2019-08-02

## 2019-08-02 MED ORDER — QUINERVA 260 MG PO TABS
650.00 | ORAL_TABLET | ORAL | Status: DC
Start: ? — End: 2019-08-02

## 2019-08-02 MED ORDER — HYDROCODONE-ACETAMINOPHEN 5-325 MG PO TABS
1.00 | ORAL_TABLET | ORAL | Status: DC
Start: ? — End: 2019-08-02

## 2019-08-02 MED ORDER — RA FISH OIL 120-180 MG PO CAPS
50.00 | ORAL_CAPSULE | ORAL | Status: DC
Start: 2019-08-01 — End: 2019-08-02

## 2019-08-02 MED ORDER — APIXABAN 5 MG PO TABS
5.00 | ORAL_TABLET | ORAL | Status: DC
Start: 2019-08-01 — End: 2019-08-02

## 2019-08-02 MED ORDER — GENERIC EXTERNAL MEDICATION
Status: DC
Start: ? — End: 2019-08-02

## 2019-08-02 MED ORDER — METRONIDAZOLE IN NACL 5-0.79 MG/ML-% IV SOLN
500.00 | INTRAVENOUS | Status: DC
Start: 2019-08-02 — End: 2019-08-02

## 2019-08-02 MED ORDER — NAPHAZOLINE HCL OP
2.00 | OPHTHALMIC | Status: DC
Start: ? — End: 2019-08-02

## 2019-08-02 MED ORDER — ONE-A-DAY WITHIN PO
30.00 | ORAL | Status: DC
Start: ? — End: 2019-08-02

## 2019-08-02 MED ORDER — METHYLERGONOVINE MALEATE PO
400.00 | ORAL | Status: DC
Start: 2019-08-01 — End: 2019-08-02

## 2019-08-02 MED ORDER — BARO-CAT PO
10.00 | ORAL | Status: DC
Start: ? — End: 2019-08-02

## 2019-08-02 MED ORDER — ACCU-PRO PUMP SET/VENT MISC
2.50 | Status: DC
Start: ? — End: 2019-08-02

## 2019-08-02 MED ORDER — CHLOROBUTANOL-EUGENOL & APAP
0.40 | Status: DC
Start: ? — End: 2019-08-02

## 2019-08-03 ENCOUNTER — Ambulatory Visit (INDEPENDENT_AMBULATORY_CARE_PROVIDER_SITE_OTHER): Payer: Medicare Other | Admitting: Family

## 2019-08-03 ENCOUNTER — Encounter: Payer: Self-pay | Admitting: Family

## 2019-08-03 ENCOUNTER — Ambulatory Visit (HOSPITAL_BASED_OUTPATIENT_CLINIC_OR_DEPARTMENT_OTHER)
Admission: RE | Admit: 2019-08-03 | Discharge: 2019-08-03 | Disposition: A | Discharge status: Home / Self Care | Payer: Medicare Other | Source: Ambulatory Visit | Attending: Family | Admitting: Family

## 2019-08-03 ENCOUNTER — Other Ambulatory Visit: Payer: Self-pay

## 2019-08-03 ENCOUNTER — Encounter: Payer: Self-pay | Admitting: Physical Therapy

## 2019-08-03 VITALS — BP 110/60 | HR 58 | Ht 63.0 in | Wt 181.0 lb

## 2019-08-03 DIAGNOSIS — I480 Paroxysmal atrial fibrillation: Secondary | ICD-10-CM | POA: Diagnosis not present

## 2019-08-03 DIAGNOSIS — Z7901 Long term (current) use of anticoagulants: Secondary | ICD-10-CM | POA: Diagnosis not present

## 2019-08-03 DIAGNOSIS — I10 Essential (primary) hypertension: Secondary | ICD-10-CM | POA: Diagnosis not present

## 2019-08-03 DIAGNOSIS — Z79899 Other long term (current) drug therapy: Secondary | ICD-10-CM | POA: Diagnosis not present

## 2019-08-03 DIAGNOSIS — I609 Nontraumatic subarachnoid hemorrhage, unspecified: Secondary | ICD-10-CM

## 2019-08-03 DIAGNOSIS — S066X0D Traumatic subarachnoid hemorrhage without loss of consciousness, subsequent encounter: Secondary | ICD-10-CM | POA: Diagnosis not present

## 2019-08-03 MED ORDER — Medication
Status: DC
Start: ? — End: 2019-08-03

## 2019-08-03 NOTE — Patient Instructions (Signed)
Medication Instructions:  No medication changes today.   If you need a refill on your cardiac medications before your next appointment, please call your pharmacy.   Lab work: No lab work today.   If you have labs (blood work) drawn today and your tests are completely normal, you will receive your results only by: Marland Kitchen MyChart Message (if you have MyChart) OR . A paper copy in the mail If you have any lab test that is abnormal or we need to change your treatment, we will call you to review the results.  Testing/Procedures: You had an EKG today - it showed sinus bradycardia.   A CT-scan of the brain has been ordered for you to follow up on previous subarachnoid hemorrhage.    Follow-Up: At [facility], you and your health needs are our priority.  As part of our continuing mission to provide you with exceptional heart care, we have created designated Provider Care Teams.  These Care Teams include your primary Cardiologist (physician) and Advanced Practice Providers (APPs -  Physician Assistants and Nurse Practitioners) who all work together to provide you with the care you need, when you need it. . Follow up with  Shirlee More, MD as previously scheduled.  Any Other Special Instructions Will Be Listed Below (If Applicable).  Continue to check blood pressure daily.   If you notice new lightheadedness, dizziness please notify our office or send a MyChart message.  Use your Kardia device if you notice any new palpitations or heart fluttering. You can send these results to [place] via MyChart as well.    Orthostatic Hypotension Blood pressure is a measurement of how strongly, or weakly, your blood is pressing against the walls of your arteries. Orthostatic hypotension is a sudden drop in blood pressure that happens when you quickly change positions, such as when you get up from sitting or lying down. Arteries are blood vessels that carry blood from your heart throughout your body. When blood  pressure is too low, you may not get enough blood to your brain or to the rest of your organs. This can cause weakness, light-headedness, rapid heartbeat, and fainting. This can last for just a few seconds or for up to a few minutes. Orthostatic hypotension is usually not a serious problem. However, if it happens frequently or gets worse, it may be a sign of something more serious. What are the causes? This condition may be caused by:  Sudden changes in posture, such as standing up quickly after you have been sitting or lying down.  Blood loss.  Loss of body fluids (dehydration).  Heart problems.  Hormone (endocrine) problems.  Pregnancy.  Severe infection.  Lack of certain nutrients.  Severe allergic reactions (anaphylaxis).  Certain medicines, such as blood pressure medicine or medicines that make the body lose excess fluids (diuretics). Sometimes, this condition can be caused by not taking medicine as directed, such as taking too much of a certain medicine. What increases the risk? The following factors may make you more likely to develop this condition:  Age. Risk increases as you get older.  Conditions that affect the heart or the central nervous system.  Taking certain medicines, such as blood pressure medicine or diuretics.  Being pregnant. What are the signs or symptoms? Symptoms of this condition may include:  Weakness.  Light-headedness.  Dizziness.  Blurred vision.  Fatigue.  Rapid heartbeat.  Fainting, in severe cases. How is this diagnosed? This condition is diagnosed based on:  Your medical history.  Your symptoms.  Your blood pressure measurement. Your health care provider will check your blood pressure when you are: ? Lying down. ? Sitting. ? Standing. A blood pressure reading is recorded as two numbers, such as "120 over 80" (or 120/80). The first ("top") number is called the systolic pressure. It is a measure of the pressure in your  arteries as your heart beats. The second ("bottom") number is called the diastolic pressure. It is a measure of the pressure in your arteries when your heart relaxes between beats. Blood pressure is measured in a unit called mm Hg. Healthy blood pressure for most adults is 120/80. If your blood pressure is below 90/60, you may be diagnosed with hypotension. Other information or tests that may be used to diagnose orthostatic hypotension include:  Your other vital signs, such as your heart rate and temperature.  Blood tests.  Tilt table test. For this test, you will be safely secured to a table that moves you from a lying position to an upright position. Your heart rhythm and blood pressure will be monitored during the test. How is this treated? This condition may be treated by:  Changing your diet. This may involve eating more salt (sodium) or drinking more water.  Taking medicines to raise your blood pressure.  Changing the dosage of certain medicines you are taking that might be lowering your blood pressure.  Wearing compression stockings. These stockings help to prevent blood clots and reduce swelling in your legs. In some cases, you may need to go to the hospital for:  Fluid replacement. This means you will receive fluids through an IV.  Blood replacement. This means you will receive donated blood through an IV (transfusion).  Treating an infection or heart problems, if this applies.  Monitoring. You may need to be monitored while medicines that you are taking wear off. Follow these instructions at home: Eating and drinking   Drink enough fluid to keep your urine pale yellow.  Eat a healthy diet, and follow instructions from your health care provider about eating or drinking restrictions. A healthy diet includes: ? Fresh fruits and vegetables. ? Whole grains. ? Lean meats. ? Low-fat dairy products.  Eat extra salt only as directed. Do not add extra salt to your diet unless  your health care provider told you to do that.  Eat frequent, small meals.  Avoid standing up suddenly after eating. Medicines  Take over-the-counter and prescription medicines only as told by your health care provider. ? Follow instructions from your health care provider about changing the dosage of your current medicines, if this applies. ? Do not stop or adjust any of your medicines on your own. General instructions   Wear compression stockings as told by your health care provider.  Get up slowly from lying down or sitting positions. This gives your blood pressure a chance to adjust.  Avoid hot showers and excessive heat as directed by your health care provider.  Return to your normal activities as told by your health care provider. Ask your health care provider what activities are safe for you.  Do not use any products that contain nicotine or tobacco, such as cigarettes, e-cigarettes, and chewing tobacco. If you need help quitting, ask your health care provider.  Keep all follow-up visits as told by your health care provider. This is important. Contact a health care provider if you:  Vomit.  Have diarrhea.  Have a fever for more than 2-3 days.  Feel more thirsty than  usual.  Feel weak and tired. Get help right away if you:  Have chest pain.  Have a fast or irregular heartbeat.  Develop numbness in any part of your body.  Cannot move your arms or your legs.  Have trouble speaking.  Become sweaty or feel light-headed.  Faint.  Feel short of breath.  Have trouble staying awake.  Feel confused. Summary  Orthostatic hypotension is a sudden drop in blood pressure that happens when you quickly change positions.  Orthostatic hypotension is usually not a serious problem.  It is diagnosed by having your blood pressure taken lying down, sitting, and then standing.  It may be treated by changing your diet or adjusting your medicines. This information is not  intended to replace advice given to you by your health care provider. Make sure you discuss any questions you have with your health care provider. Document Released: 10/31/2002 Document Revised: 05/06/2018 Document Reviewed: 05/06/2018 Elsevier Patient Education  2020 [employer].

## 2019-08-04 ENCOUNTER — Encounter (HOSPITAL_COMMUNITY): Payer: Medicare Other

## 2019-08-04 ENCOUNTER — Ambulatory Visit (HOSPITAL_COMMUNITY): Payer: Medicare Other

## 2019-08-05 ENCOUNTER — Encounter: Payer: Self-pay | Admitting: Physical Therapy

## 2019-08-08 ENCOUNTER — Encounter: Payer: Self-pay | Admitting: Osteopathic Medicine

## 2019-08-09 DIAGNOSIS — K5792 Diverticulitis of intestine, part unspecified, without perforation or abscess without bleeding: Secondary | ICD-10-CM | POA: Diagnosis not present

## 2019-08-09 DIAGNOSIS — K5900 Constipation, unspecified: Secondary | ICD-10-CM | POA: Diagnosis not present

## 2019-08-10 ENCOUNTER — Ambulatory Visit (INDEPENDENT_AMBULATORY_CARE_PROVIDER_SITE_OTHER): Payer: Medicare Other | Admitting: Osteopathic Medicine

## 2019-08-10 ENCOUNTER — Encounter: Payer: Self-pay | Admitting: Osteopathic Medicine

## 2019-08-10 ENCOUNTER — Encounter: Payer: Self-pay | Admitting: Physical Therapy

## 2019-08-10 ENCOUNTER — Other Ambulatory Visit: Payer: Self-pay

## 2019-08-10 ENCOUNTER — Ambulatory Visit: Payer: Medicare Other | Admitting: Family Medicine

## 2019-08-10 VITALS — BP 138/77 | HR 62 | Temp 98.2°F | Wt 179.6 lb

## 2019-08-10 DIAGNOSIS — Z0000 Encounter for general adult medical examination without abnormal findings: Secondary | ICD-10-CM

## 2019-08-10 DIAGNOSIS — K5792 Diverticulitis of intestine, part unspecified, without perforation or abscess without bleeding: Secondary | ICD-10-CM | POA: Diagnosis not present

## 2019-08-10 DIAGNOSIS — I10 Essential (primary) hypertension: Secondary | ICD-10-CM | POA: Diagnosis not present

## 2019-08-10 NOTE — Progress Notes (Signed)
HPI: Melissa Rodgers is a 68 y.o. female who  has a past medical history of Anxiety, Arthritis, Dyslipidemia, GERD (gastroesophageal reflux disease), Hypertension, and Subarachnoid hemorrhage following injury ([place]).  she presents to [facility] today, 08/10/19,  for chief complaint of: ER follow-up  Doing well since hospital visit Abx are helping Has a few questions about postponing colonoscopy unti lshe can see her cardiologist, postponing a scan recently ordered by sports medicine.   Available records reviewed: ER visit 07/31/2019, diagnosed with acute diverticulitis, atrial fibrillation  CT abdomen pelvis with IV contrast 08/01/2019 showed severe acute sigmoid diverticulitis, no abscess.  Follow-up was recommended to document resolution and exclude colonic malignancy.  Blood cultures 08/01/2019: 1 was positive with coagulase-negative staph likely contaminant, 1 showed no growth 5 days  Metabolic panel was okay, normal kidney and liver function, sugar 109  CBC showed elevated white blood cells at 14.8, no anemia  Treated with Cipro/Flagyl in hospital, discharged home with Augmentin. Visit yesterday with GI, Dr. Reesa Chew.  Planning for colonoscopy 6 weeks.       Past medical, surgical, social and family history reviewed:  Patient Active Problem List   Diagnosis Date Noted  . Anxiety with depression 09/09/2018  . Dry eye syndrome of both eyes 09/09/2018  . Microscopic hematuria 09/09/2018  . History of repair of hiatal hernia 09/09/2018  . Non-seasonal allergic rhinitis 09/09/2018  . History of palpitations 09/09/2018  . Mixed hyperlipidemia 09/09/2018  . Essential hypertension 09/09/2018    Past Surgical History:  Procedure Laterality Date  . HERNIA REPAIR    . MEDIAL PARTIAL KNEE REPLACEMENT Right 12/14/2014  . NISSEN FUNDOPLICATION  [id number]  . OOPHORECTOMY    . PARTIAL KNEE ARTHROPLASTY Left 04/05/2014    Social History    Tobacco Use  . Smoking status: Former Smoker    Quit date: 08/18/1983    Years since quitting: 36.0  . Smokeless tobacco: Never Used  Substance Use Topics  . Alcohol use: Yes    Comment: 7 q wk    Family History  Problem Relation Age of Onset  . Diabetes Mother   . Heart disease Mother   . Alzheimer's disease Father   . Atrial fibrillation Sister      Current medication list and allergy/intolerance information reviewed:    Current Outpatient Medications  Medication Sig Dispense Refill  . amoxicillin-clavulanate (AUGMENTIN) 875-125 MG tablet TK 1 T PO BID FOR 10 DAYS    . apixaban (ELIQUIS) 5 MG TABS tablet Take 1 tablet (5 mg total) by mouth 2 (two) times daily. 60 tablet 3  . Cetirizine HCl 10 MG CAPS Take by mouth.    . Cholecalciferol (VITAMIN D-3) 1000 units CAPS Take by mouth.    . clonazePAM (KLONOPIN) 0.5 MG tablet Take 1 tablet (0.5 mg total) by mouth 2 (two) times daily as needed for anxiety. 30 tablet 0  . Coenzyme Q10 (CO Q 10 PO) Take by mouth daily.    . Cyanocobalamin (B-12 PO) Take by mouth every other day.    . diclofenac sodium (VOLTAREN) 1 % GEL Apply 4 g topically 4 (four) times daily. To affected joint. 100 g 11  . flecainide (TAMBOCOR) 50 MG tablet Take 1 tablet (50 mg total) by mouth 2 (two) times daily. 60 tablet 3  . FLUoxetine (PROZAC) 10 MG tablet Take 0.5 tablets (5 mg total) by mouth daily. 90 tablet 3  . losartan (COZAAR) 50 MG tablet Take 0.5 tablets (25 mg total)  by mouth daily. 90 tablet 3  . LYSINE PO Take by mouth daily.    Marland Kitchen MAGNESIUM PO Take 0.5 mg by mouth every other day.    . propranolol ER (INDERAL LA) 60 MG 24 hr capsule Take 1 capsule (60 mg total) by mouth daily. 90 capsule 3   No current facility-administered medications for this visit.     Allergies  Allergen Reactions  . Sulfa Antibiotics Rash  . Rosuvastatin     Other reaction(s): Musculoskeletal Aches      Review of Systems:  Constitutional:  No  fever, no chills,  +recent illness improving, No unintentional weight changes. No significant fatigue.   HEENT: No  headache, no vision change  Cardiac: No  chest pain, No  pressure, No palpitations,  Respiratory:  No  shortness of breath. No  Cough  Gastrointestinal: No  abdominal pain, No  nausea, No  vomiting,  No  blood in stool, +diarrhea resolved , No  constipation   Musculoskeletal: No new myalgia/arthralgia  Skin: No  Rash, No other wounds/concerning lesions  Neurologic: No  weakness, No  dizziness  Psychiatric: No  concerns with depression, No  concerns with anxiety  Exam:  BP 138/77 (BP Location: Left Arm, Patient Position: Sitting, Cuff Size: Normal)   Pulse 62   Temp 98.2 F (36.8 C) (Oral)   Wt 179 lb 9.6 oz (81.5 kg)   BMI 31.81 kg/m   Constitutional: VS see above. General Appearance: alert, well-developed, well-nourished, NAD  Eyes: Normal lids and conjunctive, non-icteric sclera  Neck: No masses, trachea midline.   Respiratory: Normal respiratory effort.   Musculoskeletal: Gait normal. No clubbing/cyanosis of digits.   Neurological: Normal balance/coordination.   Psychiatric: Normal judgment/insight. Normal mood and affect. Oriented x3.         ASSESSMENT/PLAN: The primary encounter diagnosis was Diverticulitis. Diagnoses of Annual physical exam and Essential hypertension were also pertinent to this visit.   Labs ordered for future visit. Annual physical / preventive care was NOT performed or billed today.   Doing well, all questions answered  Will message Dr Georgina Snell re: postpone bone scan   Orders Placed This Encounter  Procedures  . Lipid panel  . CBC with Differential/Platelet        Visit summary with medication list and pertinent instructions was printed for patient to review. All questions at time of visit were answered - patient instructed to contact office with any additional concerns or updates. ER/RTC precautions were reviewed with the patient.    Note: Total time spent 25 minutes, greater than 50% of the visit was spent face-to-face counseling and coordinating care for the above diagnoses listed in assessment/plan.   Please note: voice recognition software was used to produce this document, and typos may escape review. Please contact Dr. Sheppard Coil for any needed clarifications.     Follow-up plan: Return for [place].

## 2019-08-11 ENCOUNTER — Encounter: Payer: Self-pay | Admitting: Family Medicine

## 2019-08-16 ENCOUNTER — Encounter (HOSPITAL_COMMUNITY): Payer: Medicare Other

## 2019-08-19 ENCOUNTER — Ambulatory Visit: Payer: Medicare Other | Admitting: Family Medicine

## 2019-08-23 ENCOUNTER — Other Ambulatory Visit: Payer: Self-pay

## 2019-08-23 ENCOUNTER — Encounter (HOSPITAL_COMMUNITY)
Admission: RE | Admit: 2019-08-23 | Discharge: 2019-08-23 | Disposition: A | Discharge status: Home / Self Care | Payer: Medicare Other | Source: Ambulatory Visit | Attending: Family Medicine | Admitting: Family Medicine

## 2019-08-23 DIAGNOSIS — M25561 Pain in right knee: Secondary | ICD-10-CM | POA: Insufficient documentation

## 2019-08-23 DIAGNOSIS — R948 Abnormal results of function studies of other organs and systems: Secondary | ICD-10-CM | POA: Diagnosis not present

## 2019-08-23 DIAGNOSIS — G8929 Other chronic pain: Secondary | ICD-10-CM

## 2019-08-23 DIAGNOSIS — M25562 Pain in left knee: Secondary | ICD-10-CM | POA: Insufficient documentation

## 2019-08-23 MED ORDER — TECHNETIUM TC 99M MEDRONATE IV KIT
20.0000 | PACK | Freq: Once | INTRAVENOUS | Status: AC | PRN
  Administered 2019-08-23: 20 via INTRAVENOUS

## 2019-08-25 ENCOUNTER — Ambulatory Visit (INDEPENDENT_AMBULATORY_CARE_PROVIDER_SITE_OTHER): Payer: Medicare Other

## 2019-08-25 ENCOUNTER — Other Ambulatory Visit: Payer: Self-pay

## 2019-08-25 DIAGNOSIS — Z1239 Encounter for other screening for malignant neoplasm of breast: Secondary | ICD-10-CM | POA: Diagnosis not present

## 2019-08-25 DIAGNOSIS — I10 Essential (primary) hypertension: Secondary | ICD-10-CM | POA: Diagnosis not present

## 2019-08-25 DIAGNOSIS — Z0000 Encounter for general adult medical examination without abnormal findings: Secondary | ICD-10-CM | POA: Diagnosis not present

## 2019-08-25 DIAGNOSIS — K5792 Diverticulitis of intestine, part unspecified, without perforation or abscess without bleeding: Secondary | ICD-10-CM | POA: Diagnosis not present

## 2019-08-25 DIAGNOSIS — Z1231 Encounter for screening mammogram for malignant neoplasm of breast: Secondary | ICD-10-CM | POA: Diagnosis not present

## 2019-08-26 ENCOUNTER — Encounter: Payer: Self-pay | Admitting: Family Medicine

## 2019-08-26 ENCOUNTER — Ambulatory Visit (INDEPENDENT_AMBULATORY_CARE_PROVIDER_SITE_OTHER): Payer: Medicare Other | Admitting: Family Medicine

## 2019-08-26 VITALS — BP 128/82 | HR 62 | Wt 181.0 lb

## 2019-08-26 DIAGNOSIS — M25562 Pain in left knee: Secondary | ICD-10-CM | POA: Diagnosis not present

## 2019-08-26 DIAGNOSIS — G8929 Other chronic pain: Secondary | ICD-10-CM

## 2019-08-26 DIAGNOSIS — M25561 Pain in right knee: Secondary | ICD-10-CM | POA: Diagnosis not present

## 2019-08-26 LAB — LIPID PANEL
Cholesterol: 229 mg/dL — ABNORMAL HIGH (ref <200)
HDL: 45 mg/dL — ABNORMAL LOW (ref >=50)
LDL Cholesterol (Calc): 139 mg/dL (calc) — ABNORMAL HIGH
Non-HDL Cholesterol (Calc): 184 mg/dL (calc) — ABNORMAL HIGH (ref <130)
Total CHOL/HDL Ratio: 5.1 (calc) — ABNORMAL HIGH (ref <5.0)
Triglycerides: 291 mg/dL — ABNORMAL HIGH (ref <150)

## 2019-08-26 LAB — CBC WITH DIFFERENTIAL/PLATELET
Absolute Monocytes: 531 cells/uL (ref 200–950)
Basophils Absolute: 61 cells/uL (ref 0–200)
Basophils Relative: 1 %
Eosinophils Absolute: 220 cells/uL (ref 15–500)
Eosinophils Relative: 3.6 %
HCT: 45.2 % — ABNORMAL HIGH (ref 35.0–45.0)
Hemoglobin: 14.5 g/dL (ref 11.7–15.5)
Lymphs Abs: 2452 cells/uL (ref 850–3900)
MCH: 28.7 pg (ref 27.0–33.0)
MCHC: 32.1 g/dL (ref 32.0–36.0)
MCV: 89.5 fL (ref 80.0–100.0)
MPV: 10.5 fL (ref 7.5–12.5)
Monocytes Relative: 8.7 %
Neutro Abs: 2837 cells/uL (ref 1500–7800)
Neutrophils Relative %: 46.5 %
Platelets: 285 10*3/uL (ref 140–400)
RBC: 5.05 10*6/uL (ref 3.80–5.10)
RDW: 13.4 % (ref 11.0–15.0)
Total Lymphocyte: 40.2 %
WBC: 6.1 10*3/uL (ref 3.8–10.8)

## 2019-08-26 NOTE — Progress Notes (Signed)
Melissa Rodgers is a 68 y.o. female who presents to [facility] today for follow-up knee pain.  I saw Melissa Rodgers 2 months ago for bilateral knee pain.  She had a history of partial knee replacement in 2015 and in 2016 of her bilateral knees.  She is been having worsening pain in both knees but left knee is worse than right for a while now.  She had initial evaluation with x-ray and trial of physical therapy.  She did not have much improvement and three-phase bone scan was ordered which showed possible hardware loosening left knee.  She is here today for recheck reevaluation.  She notes her left knee is painful enough that it is significantly limiting her activity.  For example she cannot walk more than about a mile without having significant pain.  She is reluctant to consider revision surgery but would consider it if that is her best option.    ROS:  As above  Exam:  BP 128/82   Pulse 62   Wt 181 lb (82.1 kg)   BMI 32.06 kg/m  Wt Readings from Last 5 Encounters:  08/26/19 181 lb (82.1 kg)  08/10/19 179 lb 9.6 oz (81.5 kg)  08/03/19 181 lb (82.1 kg)  07/27/19 181 lb (82.1 kg)  06/29/19 183 lb (83 kg)   General: Well Developed, well nourished, and in no acute distress.  Neuro/Psych: Alert and oriented x3, extra-ocular muscles intact, able to move all 4 extremities, sensation grossly intact. Skin: Warm and dry, no rashes noted.  Respiratory: Not using accessory muscles, speaking in full sentences, trachea midline.  Cardiovascular: Pulses palpable, no extremity edema. Abdomen: Does not appear distended. MSK: Left knee no significant effusion.  Normal range of motion.  Mildly tender. Mild antalgic gait.    Lab and Radiology Results  Nm Bone Scan 3 Phase Lower Extremity  Result Date: 08/23/2019 CLINICAL DATA:  Right knee replacement in 2015 and left knee replacement in 2016. Pain in both knees, left worse than right. EXAM: NUCLEAR MEDICINE  3-PHASE BONE SCAN TECHNIQUE: Radionuclide angiographic images, immediate static blood pool images, and 3-hour delayed static images were obtained of the knees after intravenous injection of radiopharmaceutical. RADIOPHARMACEUTICALS:  Twenty mCi Tc-5m MDP IV COMPARISON:  None. FINDINGS: Vascular phase: No abnormalities are identified. Blood pool phase: There is increased blood pool uptake in the medial aspects of both knees, left greater than right. Delayed phase: There is increased uptake in the medial left knee, greater at the site of the tibial component than the femoral component. There is increased uptake in the medial right knee as well but much less than seen on the left and also greater at the site of the medial component. IMPRESSION: 1. There is increased blood pool and delayed uptake in the medial compartment of the left knee, most prominent adjacent to the tibial component of the partial left knee replacement. Given the asymmetry of uptake in this region, the findings are concerning for loosening of at least the tibial component. 2. Very mild blood pool uptake and moderate delayed uptake in the medial compartment of the right knee, also greatest adjacent to the tibial component. The amount of uptake on the right is less specific but mild loosening is not excluded, particularly adjacent to the tibial component. Electronically Signed   By: Dorise Bullion III M.D   On: 08/23/2019 14:36   I personally (independently) visualized and performed the interpretation of the images attached in this note.  Assessment and Plan: 69 y.o. female with bilateral knee pain left worse than right about 5 years following partial knee replacement. Patient is not doing very well and her pain is significantly interfering with her quality of life.  Three-phase bone scan shows uptake concerning for hardware loosening which fits with the pain that she is having.  At this point there is not much conservative management  left to do.  I think her best option is surgical consultation for total knee replacement revision.  If she is just not a surgical candidate or she our does not want to have surgery I suppose the next step would probably be geniculate nerve block/ablation although I do not think that is her best option.  Additionally we could consider trying PRP injection in her knee.  I discussed that this is still somewhat experimental and not well studied especially in her situation.  The risk of infection likely outweighs the potential benefit although that is a possibility in the future.   I spent 25 minutes with this patient, greater than 50% was face-to-face time counseling regarding bone scan findings diagnosis next steps treatment plan and backup plan.Marland Kitchen  PDMP not reviewed this encounter. Orders Placed This Encounter  Procedures  . Ambulatory referral to Orthopedic Surgery    Referral Priority:   Routine    Referral Type:   Surgical    Referral Reason:   Specialty Services Required    Referred to Provider:   Paralee Cancel, MD    Requested Specialty:   Orthopedic Surgery    Number of Visits Requested:   1   No orders of the defined types were placed in this encounter.   Historical information moved to improve visibility of documentation.  Past Medical History:  Diagnosis Date  . Anxiety   . Arthritis   . Dyslipidemia   . GERD (gastroesophageal reflux disease)   . Hypertension   . Subarachnoid hemorrhage following injury [facility])    Past Surgical History:  Procedure Laterality Date  . HERNIA REPAIR    . MEDIAL PARTIAL KNEE REPLACEMENT Right 12/14/2014  . NISSEN FUNDOPLICATION  [id number]  . OOPHORECTOMY    . PARTIAL KNEE ARTHROPLASTY Left 04/05/2014   Social History   Tobacco Use  . Smoking status: Former Smoker    Quit date: 08/18/1983    Years since quitting: 36.0  . Smokeless tobacco: Never Used  Substance Use Topics  . Alcohol use: Yes    Comment: 7 q wk   family history  includes Alzheimer's disease in her father; Atrial fibrillation in her sister; Diabetes in her mother; Heart disease in her mother.  Medications: Current Outpatient Medications  Medication Sig Dispense Refill  . apixaban (ELIQUIS) 5 MG TABS tablet Take 1 tablet (5 mg total) by mouth 2 (two) times daily. 60 tablet 3  . Cetirizine HCl 10 MG CAPS Take by mouth.    . Cholecalciferol (VITAMIN D-3) 1000 units CAPS Take by mouth.    . clonazePAM (KLONOPIN) 0.5 MG tablet Take 1 tablet (0.5 mg total) by mouth 2 (two) times daily as needed for anxiety. 30 tablet 0  . Coenzyme Q10 (CO Q 10 PO) Take by mouth daily.    . Cyanocobalamin (B-12 PO) Take by mouth every other day.    . diclofenac sodium (VOLTAREN) 1 % GEL Apply 4 g topically 4 (four) times daily. To affected joint. 100 g 11  . flecainide (TAMBOCOR) 50 MG tablet Take 1 tablet (50 mg total) by mouth 2 (two) times  daily. 60 tablet 3  . losartan (COZAAR) 50 MG tablet Take 0.5 tablets (25 mg total) by mouth daily. 90 tablet 3  . LYSINE PO Take by mouth daily.    Marland Kitchen MAGNESIUM PO Take 0.5 mg by mouth every other day.    . propranolol ER (INDERAL LA) 60 MG 24 hr capsule Take 1 capsule (60 mg total) by mouth daily. 90 capsule 3   No current facility-administered medications for this visit.    Allergies  Allergen Reactions  . Rosuvastatin Other (See Comments)    Other reaction(s): Musculoskeletal Aches Muscle spasms  . Sulfa Antibiotics Rash and Other (See Comments)      Discussed warning signs or symptoms. Please see discharge instructions. Patient expresses understanding.

## 2019-08-26 NOTE — Patient Instructions (Signed)
Thank you for coming in today. You should hear about referral to [facility] in [facility]  Dr Alvan Dame.   Let me know if you need anything let me know.

## 2019-08-29 ENCOUNTER — Encounter: Payer: Self-pay | Admitting: Osteopathic Medicine

## 2019-08-29 ENCOUNTER — Other Ambulatory Visit: Payer: Self-pay

## 2019-08-29 ENCOUNTER — Ambulatory Visit (INDEPENDENT_AMBULATORY_CARE_PROVIDER_SITE_OTHER): Payer: Medicare Other | Admitting: Osteopathic Medicine

## 2019-08-29 ENCOUNTER — Telehealth: Payer: Self-pay

## 2019-08-29 VITALS — BP 134/61 | HR 66 | Temp 98.2°F | Wt 181.2 lb

## 2019-08-29 DIAGNOSIS — E782 Mixed hyperlipidemia: Secondary | ICD-10-CM

## 2019-08-29 DIAGNOSIS — I10 Essential (primary) hypertension: Secondary | ICD-10-CM | POA: Diagnosis not present

## 2019-08-29 DIAGNOSIS — Z23 Encounter for immunization: Secondary | ICD-10-CM | POA: Diagnosis not present

## 2019-08-29 DIAGNOSIS — F418 Other specified anxiety disorders: Secondary | ICD-10-CM

## 2019-08-29 DIAGNOSIS — Z7189 Other specified counseling: Secondary | ICD-10-CM

## 2019-08-29 MED ORDER — ZOSTER VAC RECOMB ADJUVANTED 50 MCG/0.5ML IM SUSR: 0.5000 mL | Freq: Once | INTRAMUSCULAR | 1 refills | Status: AC

## 2019-08-29 MED ORDER — PRAVASTATIN SODIUM 20 MG PO TABS: 20.0000 mg | ORAL_TABLET | Freq: Every day | ORAL | 3 refills | Status: DC

## 2019-08-29 MED ORDER — ALPRAZOLAM 0.5 MG PO TABS: 0.2500 mg | ORAL_TABLET | Freq: Two times a day (BID) | ORAL | 0 refills | Status: DC | PRN

## 2019-08-29 NOTE — Telephone Encounter (Signed)
-----   Message from [name], DO sent at 08/29/2019  2:58 PM EDT ----- Mammo done 08/25/19, I dont have a report back yet, can we call radiology and see what the hold-up is? This should have resulted by now! THanks!

## 2019-08-29 NOTE — Progress Notes (Signed)
HPI: Melissa Rodgers is a 67 y.o. female who  has a past medical history of Anxiety, Arthritis, Dyslipidemia, GERD (gastroesophageal reflux disease), Hypertension, and Subarachnoid hemorrhage following injury ([place]).  she presents to [facility] today, 08/29/19,  for chief complaint of: High cholesterol  HLD:  ASCVD risk in statin benefit group, she has hx myalgia w/ Crestor but she's open to trying lower potency Rx, she's concerned about cholesterol and TG numbers.   Anxiety: Klonopin sparing use helps but she feels drowsy for about a day, has been on Xanax in the past which was a bit better in terms of short-acting if needed.   HTN:  BP at home <130/80 consistently. No CP/SOB.    BP Readings from Last 3 Encounters:  08/29/19 134/61  08/26/19 128/82  08/10/19 138/77      Past medical, surgical, social and family history reviewed:  Patient Active Problem List   Diagnosis Date Noted  . Anxiety with depression 09/09/2018  . Dry eye syndrome of both eyes 09/09/2018  . Microscopic hematuria 09/09/2018  . History of repair of hiatal hernia 09/09/2018  . Non-seasonal allergic rhinitis 09/09/2018  . History of palpitations 09/09/2018  . Mixed hyperlipidemia 09/09/2018  . Essential hypertension 09/09/2018    Past Surgical History:  Procedure Laterality Date  . HERNIA REPAIR    . MEDIAL PARTIAL KNEE REPLACEMENT Right 12/14/2014  . NISSEN FUNDOPLICATION  [id number]  . OOPHORECTOMY    . PARTIAL KNEE ARTHROPLASTY Left 04/05/2014    Social History   Tobacco Use  . Smoking status: Former Smoker    Quit date: 08/18/1983    Years since quitting: 36.0  . Smokeless tobacco: Never Used  Substance Use Topics  . Alcohol use: Yes    Comment: 7 q wk    Family History  Problem Relation Age of Onset  . Diabetes Mother   . Heart disease Mother   . Alzheimer's disease Father   . Atrial fibrillation Sister      Current medication list and  allergy/intolerance information reviewed:    Current Outpatient Medications  Medication Sig Dispense Refill  . apixaban (ELIQUIS) 5 MG TABS tablet Take 1 tablet (5 mg total) by mouth 2 (two) times daily. 60 tablet 3  . Cetirizine HCl 10 MG CAPS Take by mouth.    . Cholecalciferol (VITAMIN D-3) 1000 units CAPS Take by mouth.    . clonazePAM (KLONOPIN) 0.5 MG tablet Take 1 tablet (0.5 mg total) by mouth 2 (two) times daily as needed for anxiety. 30 tablet 0  . Coenzyme Q10 (CO Q 10 PO) Take by mouth daily.    . Cyanocobalamin (B-12 PO) Take by mouth every other day.    . diclofenac sodium (VOLTAREN) 1 % GEL Apply 4 g topically 4 (four) times daily. To affected joint. 100 g 11  . flecainide (TAMBOCOR) 50 MG tablet Take 1 tablet (50 mg total) by mouth 2 (two) times daily. 60 tablet 3  . losartan (COZAAR) 50 MG tablet Take 0.5 tablets (25 mg total) by mouth daily. 90 tablet 3  . LYSINE PO Take by mouth daily.    Marland Kitchen MAGNESIUM PO Take 0.5 mg by mouth every other day.    . propranolol ER (INDERAL LA) 60 MG 24 hr capsule Take 1 capsule (60 mg total) by mouth daily. 90 capsule 3   No current facility-administered medications for this visit.     Allergies  Allergen Reactions  . Rosuvastatin Other (See Comments)  Other reaction(s): Musculoskeletal Aches Muscle spasms  . Sulfa Antibiotics Rash and Other (See Comments)      Review of Systems:  Constitutional:  No  fever, no chills  HEENT: No  headache, no vision change  Cardiac: No  chest pain, No  pressure  Respiratory:  No  shortness of breath. No  Cough  Gastrointestinal: No  abdominal pain, No  nausea, No  vomiting,  No  blood in stool, No  diarrhea, No  constipation   Hem/Onc: +easy bruising/bleeding  Endocrine: No cold intolerance,  No heat intolerance. No polyuria/polydipsia/polyphagia   Neurologic: No  weakness, No  dizziness  Psychiatric: No  concerns with depression, No  concerns with anxiety, No sleep problems, No mood  problems  Exam:  BP 134/61 (BP Location: Right Arm, Patient Position: Sitting, Cuff Size: Normal)   Pulse 66   Temp 98.2 F (36.8 C) (Oral)   Wt 181 lb 3.2 oz (82.2 kg)   BMI 32.10 kg/m   Constitutional: VS see above. General Appearance: alert, well-developed, well-nourished, NAD  Eyes: Normal lids and conjunctive, non-icteric sclera  Ears, Nose, Mouth, Throat:TM normal bilaterally.   Neck: No masses, trachea midline. No thyroid enlargement. No tenderness/mass appreciated. No lymphadenopathy  Respiratory: Normal respiratory effort. no wheeze, no rhonchi, no rales  Cardiovascular: S1/S2 normal, no murmur, no rub/gallop auscultated. RRR. No lower extremity edema. Sounds to be in sinus rhythm   Gastrointestinal: Nontender, no masses. No hepatomegaly, no splenomegaly. No hernia appreciated. Bowel sounds normal. Rectal exam deferred.   Musculoskeletal: Gait normal. No clubbing/cyanosis of digits.   Neurological: Normal balance/coordination. No tremor. No cranial nerve deficit on limited exam. Motor and sensation intact and symmetric. Cerebellar reflexes intact.   Skin: warm, dry, intact. No rash/ulcer. No concerning nevi or subq nodules on limited exam.    Psychiatric: Normal judgment/insight. Normal mood and affect. Oriented x3.      ASSESSMENT/PLAN: The primary encounter diagnosis was Mixed hyperlipidemia. Diagnoses of Need for influenza vaccination, Cardiac risk counseling, Essential hypertension, and Situational anxiety were also pertinent to this visit.   The 10-year ASCVD risk score Mikey Bussing DC Brooke Bonito., et al., 2013) is: 13.5%   Values used to calculate the score:     Age: 26 years     Sex: Female     Is Non-Hispanic African American: No     Diabetic: No     Tobacco smoker: No     Systolic Blood Pressure: Q000111Q mmHg     Is BP treated: Yes     HDL Cholesterol: 45 mg/dL     Total Cholesterol: 229 mg/dL   Orders Placed This Encounter  Procedures  . Flu Vaccine QUAD High  Dose(Fluad)    Meds ordered this encounter  Medications  . pravastatin (PRAVACHOL) 20 MG tablet    Sig: Take 1 tablet (20 mg total) by mouth daily.    Dispense:  90 tablet    Refill:  3  . ALPRAZolam (XANAX) 0.5 MG tablet    Sig: Take 0.5-1 tablets (0.25-0.5 mg total) by mouth 2 (two) times daily as needed for anxiety.    Dispense:  30 tablet    Refill:  0    Patient Instructions   General Preventive Care  Most recent routine screening lipids/other labs:   Everyone should have blood pressure checked once per year.   Tobacco: don't!   Alcohol: responsible moderation is ok for most adults - if you have concerns about your alcohol intake, please talk to me!  Exercise: as tolerated to reduce risk of cardiovascular disease and diabetes. Strength training will also prevent osteoporosis.   Mental health: if need for mental health care (medicines, counseling, other), or concerns about moods, please let me know!   Sexual health: if need for STD testing, or if concerns with libido/pain problems, please let me know!   Advanced Directive: Living Will and/or Healthcare Power of Attorney recommended for all adults, regardless of age or health.  Vaccines  Flu vaccine: recommended for almost everyone, every fall.   Shingles vaccine: Shingrix recommended after age 38. Some people have had the old Zostavax vaccine. Rx to pharmacy.   Pneumonia vaccines: all done  Tetanus booster: up to date Cancer screenings   Colon cancer screening: colonoscopy per GI   Breast cancer screening:I'll look into mammo results   Cervical cancer screening: Can usually stop at age 59 or w/ hysterectomy.   Lung cancer screening: not needed since quit smoking >15 years ago.  Infection screenings . HIV: recommended screening at least once age 93-65, more often as needed. . Gonorrhea/Chlamydia: screening as needed . Hepatitis C: recommended for anyone born 72-1966 . TB: certain at-risk populations, or  depending on work requirements and/or travel history Other  Bone Density Test: recommended for women at age 60           Immunization History  Administered Date(s) Administered  . Fluad Quad(high Dose 65+) 08/29/2019  . Influenza Whole 09/13/2014  . Influenza, High Dose Seasonal PF 10/07/2016, 09/15/2017, 09/09/2018  . Influenza,inj,Quad PF,6+ Mos 09/26/2015  . Influenza,inj,quad, With Preservative 09/09/2013  . Pneumococcal Conjugate-13 08/10/2015  . Pneumococcal Polysaccharide-23 10/14/2013  . Tdap 03/20/2015  . Zoster 01/27/2013           Visit summary with medication list and pertinent instructions was printed for patient to review. All questions at time of visit were answered - patient instructed to contact office with any additional concerns or updates. ER/RTC precautions were reviewed with the patient.   Note: Total time spent 25 minutes, greater than 50% of the visit was spent face-to-face counseling and coordinating care for the above diagnoses listed in assessment/plan.   Please note: voice recognition software was used to produce this document, and typos may escape review. Please contact Dr. Sheppard Coil for any needed clarifications.     Follow-up plan: Return in about 6 weeks (around 10/10/2019) for LAB VISIT ONLY recheck cholesterol. See Dr A in 6 mos for MEDICARE WELLNESS. Marland Kitchen

## 2019-08-29 NOTE — Telephone Encounter (Signed)
Due to October being Breast Cancer Awareness Month, they are very busy and behind on reading

## 2019-08-29 NOTE — Patient Instructions (Addendum)
General Preventive Care  Most recent routine screening lipids/other labs:   Everyone should have blood pressure checked once per year.   Tobacco: don't!   Alcohol: responsible moderation is ok for most adults - if you have concerns about your alcohol intake, please talk to me!   Exercise: as tolerated to reduce risk of cardiovascular disease and diabetes. Strength training will also prevent osteoporosis.   Mental health: if need for mental health care (medicines, counseling, other), or concerns about moods, please let me know!   Sexual health: if need for STD testing, or if concerns with libido/pain problems, please let me know!   Advanced Directive: Living Will and/or Healthcare Power of Attorney recommended for all adults, regardless of age or health.  Vaccines  Flu vaccine: recommended for almost everyone, every fall.   Shingles vaccine: Shingrix recommended after age 77. Some people have had the old Zostavax vaccine. Rx to pharmacy.   Pneumonia vaccines: all done  Tetanus booster: up to date Cancer screenings   Colon cancer screening: colonoscopy per GI   Breast cancer screening:I'll look into mammo results   Cervical cancer screening: Can usually stop at age 59 or w/ hysterectomy.   Lung cancer screening: not needed since quit smoking >15 years ago.  Infection screenings . HIV: recommended screening at least once age 3-65, more often as needed. . Gonorrhea/Chlamydia: screening as needed . Hepatitis C: recommended for anyone born 78-1966 . TB: certain at-risk populations, or depending on work requirements and/or travel history Other  Bone Density Test: recommended for women at age 37           Immunization History  Administered Date(s) Administered  . Fluad Quad(high Dose 65+) 08/29/2019  . Influenza Whole 09/13/2014  . Influenza, High Dose Seasonal PF 10/07/2016, 09/15/2017, 09/09/2018  . Influenza,inj,Quad PF,6+ Mos 09/26/2015  . Influenza,inj,quad,  With Preservative 09/09/2013  . Pneumococcal Conjugate-13 08/10/2015  . Pneumococcal Polysaccharide-23 10/14/2013  . Tdap 03/20/2015  . Zoster 01/27/2013

## 2019-08-31 ENCOUNTER — Other Ambulatory Visit: Payer: Self-pay | Admitting: Osteopathic Medicine

## 2019-08-31 DIAGNOSIS — F418 Other specified anxiety disorders: Secondary | ICD-10-CM

## 2019-08-31 DIAGNOSIS — I10 Essential (primary) hypertension: Secondary | ICD-10-CM

## 2019-08-31 NOTE — Telephone Encounter (Signed)
Got results today Normal mammogram

## 2019-09-01 NOTE — Telephone Encounter (Signed)
Left normal results on pt's VM

## 2019-09-06 DIAGNOSIS — Z96652 Presence of left artificial knee joint: Secondary | ICD-10-CM

## 2019-09-06 DIAGNOSIS — Z96651 Presence of right artificial knee joint: Secondary | ICD-10-CM

## 2019-09-06 HISTORY — DX: Presence of right artificial knee joint: Z96.651

## 2019-09-06 HISTORY — DX: Presence of left artificial knee joint: Z96.652

## 2019-09-09 DIAGNOSIS — T8484XA Pain due to internal orthopedic prosthetic devices, implants and grafts, initial encounter: Secondary | ICD-10-CM | POA: Diagnosis not present

## 2019-09-09 DIAGNOSIS — Z471 Aftercare following joint replacement surgery: Secondary | ICD-10-CM | POA: Diagnosis not present

## 2019-09-09 DIAGNOSIS — Z96653 Presence of artificial knee joint, bilateral: Secondary | ICD-10-CM | POA: Diagnosis not present

## 2019-09-13 NOTE — Progress Notes (Signed)
Cardiology Office Note:    Date:  09/14/2019   ID:  Melissa Rodgers, DOB Sep 27, 1951, MRN [id number]  PCP:  [name], DO  Cardiologist:  Shirlee More, MD    Referring MD: [name], DO    ASSESSMENT:    1. PAF (paroxysmal atrial fibrillation) ([place])   2. High risk medication use   3. Chronic anticoagulation    PLAN:    In order of problems listed above:  1. Stable continue flecainide anticoagulant changed to a hydrophilic selective beta-blocker and follow-up in the office in 6 months or sooner. 2. Continue statin with muscle side effects and a calcium cardiac score of 0 3. And instructions for management of anticoagulant for GI and total knee arthroplasty   Next appointment: 6 months   Medication Adjustments/Labs and Tests Ordered: Current medicines are reviewed at length with the patient today.  Concerns regarding medicines are outlined above.  Orders Placed This Encounter  Procedures   EKG 12-Lead   Meds ordered this encounter  Medications   metoprolol succinate (TOPROL XL) 25 MG 24 hr tablet    Sig: Take 1 tablet (25 mg total) by mouth daily.    Dispense:  30 tablet    Refill:  3    Chief Complaint  Patient presents with   Follow-up   Atrial Fibrillation    History of Present Illness:    Melissa Rodgers is a 69 y.o. female with a hx of hypertension, PVC, PAC, PAF on flecainide and anticoagulation last seen 08/03/2019. Compliance with diet, lifestyle and medications: Yes  He has concerns and raises several issues.  She is pending colonoscopy and total knee arthroplasty and I told her to hold her anticoagulant for 4 doses 2 days for GI 6 doses 3 days for total knee arthroplasty and discussed with both physicians when she can resume typically 1 to 2 days after procedure.  Tolerates flecainide has had a good response and has had no significant breakthrough episodes of atrial fibrillation or palpitation.  Discussed the risk and benefits of  anticoagulation and she chooses to remain on Eliquis.  She thinks she is poorly tolerant of a statin with muscle symptoms has a calcium score of 0 and I agree with her that I think she could stop her statin at this time.  Finds her self suffering from malaise and fatigue likely related to the lipid soluble nonselective beta-blocker she will discontinue and take a very low-dose of a selective beta-blocker to avoid CNS side effects.  She is happy with this approach and will follow up in the office in 6 months or sooner if she is having breakthrough episodes documented with her iPhone EKG adapter. Past Medical History:  Diagnosis Date   Anxiety    Arthritis    Dyslipidemia    GERD (gastroesophageal reflux disease)    Hypertension    Subarachnoid hemorrhage following injury [facility])     Past Surgical History:  Procedure Laterality Date   HERNIA REPAIR     MEDIAL PARTIAL KNEE REPLACEMENT Right [id number]   NISSEN FUNDOPLICATION  [id number]   OOPHORECTOMY     PARTIAL KNEE ARTHROPLASTY Left 04/05/2014    Current Medications: Current Meds  Medication Sig   ALPRAZolam (XANAX) 0.5 MG tablet Take 0.5-1 tablets (0.25-0.5 mg total) by mouth 2 (two) times daily as needed for anxiety.   apixaban (ELIQUIS) 5 MG TABS tablet Take 1 tablet (5 mg total) by mouth 2 (two) times daily.   Cetirizine HCl 10 MG CAPS Take 5  mg by mouth daily.    Cholecalciferol (VITAMIN D-3) 1000 units CAPS Take 1 capsule by mouth daily.    clonazePAM (KLONOPIN) 0.5 MG tablet Take 1 tablet (0.5 mg total) by mouth 2 (two) times daily as needed for anxiety.   Cyanocobalamin (B-12 PO) Take by mouth every other day.   diclofenac sodium (VOLTAREN) 1 % GEL Apply 4 g topically 4 (four) times daily. To affected joint.   flecainide (TAMBOCOR) 50 MG tablet Take 1 tablet (50 mg total) by mouth 2 (two) times daily.   losartan (COZAAR) 50 MG tablet Take 25 mg by mouth daily.   LYSINE PO Take 1 tablet by mouth daily as  needed.    MAGNESIUM PO Take 0.5 mg by mouth every other day.   [DISCONTINUED] pravastatin (PRAVACHOL) 20 MG tablet Take 1 tablet (20 mg total) by mouth daily. (Patient taking differently: Take 20 mg by mouth daily. Currently taking 10mg  daily)   [DISCONTINUED] propranolol ER (INDERAL LA) 60 MG 24 hr capsule Take 1 capsule (60 mg total) by mouth daily.     Allergies:   Rosuvastatin and Sulfa antibiotics   Social History   Socioeconomic History   Marital status: Married    Spouse name: Not on file   Number of children: 0   Years of education: Not on file   Highest education level: Not on file  Occupational History   Not on file  Social Needs   Financial resource strain: Not on file   Food insecurity    Worry: Not on file    Inability: Not on file   Transportation needs    Medical: Not on file    Non-medical: Not on file  Tobacco Use   Smoking status: Former Smoker    Types: Cigarettes    Quit date: 08/18/1983    Years since quitting: 36.0   Smokeless tobacco: Never Used  Substance and Sexual Activity   Alcohol use: Yes    Comment: 7 -10 drinks per week    Drug use: Never   Sexual activity: Yes    Partners: Male    Birth control/protection: None  Lifestyle   Physical activity    Days per week: Not on file    Minutes per session: Not on file   Stress: Not on file  Relationships   Social connections    Talks on phone: Not on file    Gets together: Not on file    Attends religious service: Not on file    Active member of club or organization: Not on file    Attends meetings of clubs or organizations: Not on file    Relationship status: Not on file  Other Topics Concern   Not on file  Social History Narrative   Not on file     Family History: The patient's family history includes Alzheimer's disease in her father; Atrial fibrillation in her sister; Diabetes in her mother; Heart disease in her mother. ROS:   Please see the history of present  illness.    All other systems reviewed and are negative.  EKGs/Labs/Other Studies Reviewed:    The following studies were reviewed today:  EKG:  EKG ordered today and personally reviewed.  The ekg ordered today demonstrates sinus rhythm normal EKG her QRS duration is normal at 78 ms no evidence of flecainide toxicity  Recent Labs: 08/25/2019: Hemoglobin 14.5; Platelets 285  Recent Lipid Panel    Component Value Date/Time   CHOL 229 (H) 08/25/2019 [id number]  TRIG 291 (H) 08/25/2019 0826   HDL 45 (L) 08/25/2019 0826   CHOLHDL 5.1 (H) 08/25/2019 0826   LDLCALC 139 (H) 08/25/2019 0826    Physical Exam:    VS:  BP 128/88 (BP Location: Right Arm, Patient Position: Sitting, Cuff Size: Normal)    Pulse 62    Ht 5\' 3"  (1.6 m)    Wt 180 lb 12.8 oz (82 kg)    SpO2 98%    BMI 32.03 kg/m     Wt Readings from Last 3 Encounters:  09/14/19 180 lb 12.8 oz (82 kg)  08/29/19 181 lb 3.2 oz (82.2 kg)  08/26/19 181 lb (82.1 kg)     GEN:  Well nourished, well developed in no acute distress HEENT: Normal NECK: No JVD; No carotid bruits LYMPHATICS: No lymphadenopathy CARDIAC: RRR, no murmurs, rubs, gallops RESPIRATORY:  Clear to auscultation without rales, wheezing or rhonchi  ABDOMEN: Soft, non-tender, non-distended MUSCULOSKELETAL:  No edema; No deformity  SKIN: Warm and dry NEUROLOGIC:  Alert and oriented x 3 PSYCHIATRIC:  Normal affect    Signed, Shirlee More, MD  09/14/2019 11:03 AM    [place]

## 2019-09-14 ENCOUNTER — Telehealth: Payer: Self-pay | Admitting: Cardiology

## 2019-09-14 ENCOUNTER — Other Ambulatory Visit: Payer: Self-pay

## 2019-09-14 ENCOUNTER — Encounter: Payer: Self-pay | Admitting: Cardiology

## 2019-09-14 ENCOUNTER — Ambulatory Visit (INDEPENDENT_AMBULATORY_CARE_PROVIDER_SITE_OTHER): Payer: Medicare Other | Admitting: Cardiology

## 2019-09-14 VITALS — BP 128/88 | HR 62 | Ht 63.0 in | Wt 180.8 lb

## 2019-09-14 DIAGNOSIS — Z7901 Long term (current) use of anticoagulants: Secondary | ICD-10-CM

## 2019-09-14 DIAGNOSIS — I480 Paroxysmal atrial fibrillation: Secondary | ICD-10-CM

## 2019-09-14 DIAGNOSIS — Z79899 Other long term (current) drug therapy: Secondary | ICD-10-CM | POA: Diagnosis not present

## 2019-09-14 MED ORDER — METOPROLOL SUCCINATE ER 25 MG PO TB24: 25.0000 mg | ORAL_TABLET | Freq: Every day | ORAL | 3 refills | Status: DC

## 2019-09-14 MED ORDER — APIXABAN 5 MG PO TABS: 5.0000 mg | ORAL_TABLET | Freq: Two times a day (BID) | ORAL | 1 refills | Status: DC

## 2019-09-14 NOTE — Telephone Encounter (Signed)
Patient was here today and forget to request she would like a 90 day Eliquis script sent to [facility] so it will be cheaper foir her  She will be faxing copy of her card to [place] shortly

## 2019-09-14 NOTE — Patient Instructions (Signed)
Medication Instructions:  Your physician has recommended you make the following change in your medication:   STOP pravastatin  STOP propranolol  START metoprolol succinate (toprol XL) 25 mg: Take 1 tablet daily   *If you need a refill on your cardiac medications before your next appointment, please call your pharmacy*  Lab Work: None  If you have labs (blood work) drawn today and your tests are completely normal, you will receive your results only by: Marland Kitchen MyChart Message (if you have MyChart) OR . A paper copy in the mail If you have any lab test that is abnormal or we need to change your treatment, we will call you to review the results.  Testing/Procedures: You had an EKG today.   Follow-Up: At [facility], you and your health needs are our priority.  As part of our continuing mission to provide you with exceptional heart care, we have created designated Provider Care Teams.  These Care Teams include your primary Cardiologist (physician) and Advanced Practice Providers (APPs -  Physician Assistants and Nurse Practitioners) who all work together to provide you with the care you need, when you need it.  Your next appointment:   6 months  The format for your next appointment:   In Person  Provider:   Shirlee More, MD   Metoprolol extended-release tablets What is this medicine? METOPROLOL (me TOE proe lole) is a beta-blocker. Beta-blockers reduce the workload on the heart and help it to beat more regularly. This medicine is used to treat high blood pressure and to prevent chest pain. It is also used to after a heart attack and to prevent an additional heart attack from occurring. This medicine may be used for other purposes; ask your health care provider or pharmacist if you have questions. COMMON BRAND NAME(S): toprol, Toprol XL What should I tell my health care provider before I take this medicine? They need to know if you have any of these conditions:  diabetes  heart or  vessel disease like slow heart rate, worsening heart failure, heart block, sick sinus syndrome or Raynaud's disease  kidney disease  liver disease  lung or breathing disease, like asthma or emphysema  pheochromocytoma  thyroid disease  an unusual or allergic reaction to metoprolol, other beta-blockers, medicines, foods, dyes, or preservatives  pregnant or trying to get pregnant  breast-feeding How should I use this medicine? Take this medicine by mouth with a glass of water. Follow the directions on the prescription label. Do not crush or chew. Take this medicine with or immediately after meals. Take your doses at regular intervals. Do not take more medicine than directed. Do not stop taking this medicine suddenly. This could lead to serious heart-related effects. Talk to your pediatrician regarding the use of this medicine in children. While this drug may be prescribed for children as young as 6 years for selected conditions, precautions do apply. Overdosage: If you think you have taken too much of this medicine contact a poison control center or emergency room at once. NOTE: This medicine is only for you. Do not share this medicine with others. What if I miss a dose? If you miss a dose, take it as soon as you can. If it is almost time for your next dose, take only that dose. Do not take double or extra doses. What may interact with this medicine? This medicine may interact with the following medications:  certain medicines for blood pressure, heart disease, irregular heart beat  certain medicines for depression,  like monoamine oxidase (MAO) inhibitors, fluoxetine, or paroxetine  clonidine  dobutamine  epinephrine  isoproterenol  reserpine This list may not describe all possible interactions. Give your health care provider a list of all the medicines, herbs, non-prescription drugs, or dietary supplements you use. Also tell them if you smoke, drink alcohol, or use illegal  drugs. Some items may interact with your medicine. What should I watch for while using this medicine? Visit your doctor or health care professional for regular check ups. Contact your doctor right away if your symptoms worsen. Check your blood pressure and pulse rate regularly. Ask your health care professional what your blood pressure and pulse rate should be, and when you should contact them. You may get drowsy or dizzy. Do not drive, use machinery, or do anything that needs mental alertness until you know how this medicine affects you. Do not sit or stand up quickly, especially if you are an older patient. This reduces the risk of dizzy or fainting spells. Contact your doctor if these symptoms continue. Alcohol may interfere with the effect of this medicine. Avoid alcoholic drinks. This medicine may increase blood sugar. Ask your healthcare provider if changes in diet or medicines are needed if you have diabetes. What side effects may I notice from receiving this medicine? Side effects that you should report to your doctor or health care professional as soon as possible:  allergic reactions like skin rash, itching or hives  cold or numb hands or feet  depression  difficulty breathing  faint  fever with sore throat  irregular heartbeat, chest pain  rapid weight gain   signs and symptoms of high blood sugar such as being more thirsty or hungry or having to urinate more than normal. You may also feel very tired or have blurry vision.  swollen legs or ankles Side effects that usually do not require medical attention (report to your doctor or health care professional if they continue or are bothersome):  anxiety or nervousness  change in sex drive or performance  dry skin  headache  nightmares or trouble sleeping  short term memory loss  stomach upset or diarrhea This list may not describe all possible side effects. Call your doctor for medical advice about side effects. You  may report side effects to FDA at 1-800-FDA-1088. Where should I keep my medicine? Keep out of the reach of children. Store at room temperature between 15 and 30 degrees C (59 and 86 degrees F). Throw away any unused medicine after the expiration date. NOTE: This sheet is a summary. It may not cover all possible information. If you have questions about this medicine, talk to your doctor, pharmacist, or health care provider.  2020 Elsevier/Gold Standard (2018-08-31 11:09:41)

## 2019-09-14 NOTE — Telephone Encounter (Signed)
Eliquis 5 mg twice daily refilled and sent in for a 90 day supply to [facility].

## 2019-09-20 ENCOUNTER — Telehealth (INDEPENDENT_AMBULATORY_CARE_PROVIDER_SITE_OTHER): Payer: Medicare Other | Admitting: Osteopathic Medicine

## 2019-09-20 DIAGNOSIS — Z01818 Encounter for other preprocedural examination: Secondary | ICD-10-CM

## 2019-09-20 NOTE — Telephone Encounter (Signed)
Can recheck cholesterol, sure!

## 2019-09-20 NOTE — Telephone Encounter (Signed)
Pt has been updated of provider's note. Agreeable with plan. She will have labs completed by November (surgery scheduled mid - December). Pt mentioned during the call that she had a visit with cardiologist. The provider informed her to stop taking statin medication. Pt was due to have labs recheck in November. Pt wants to know should she still have labs completed for cholesterol check? Pls advise, thanks.

## 2019-09-20 NOTE — Telephone Encounter (Signed)
Please call patient: I received a request from her orthopedic surgeon to get a preoperative physical for her upcoming knee replacement.  They are asking for some additional blood work that we do not have, I placed these orders, can she go to the lab to get this done?  Thanks!  5 mins spent

## 2019-09-20 NOTE — Telephone Encounter (Signed)
Pt has been updated of provider's note. Agreeable with plan. Pt will fast on the day she has her lab order done. No other inquiries during call.

## 2019-10-04 DIAGNOSIS — K6389 Other specified diseases of intestine: Secondary | ICD-10-CM | POA: Diagnosis not present

## 2019-10-04 DIAGNOSIS — K648 Other hemorrhoids: Secondary | ICD-10-CM | POA: Diagnosis not present

## 2019-10-04 DIAGNOSIS — R933 Abnormal findings on diagnostic imaging of other parts of digestive tract: Secondary | ICD-10-CM | POA: Diagnosis not present

## 2019-10-04 DIAGNOSIS — D1779 Benign lipomatous neoplasm of other sites: Secondary | ICD-10-CM | POA: Diagnosis not present

## 2019-10-04 DIAGNOSIS — K5730 Diverticulosis of large intestine without perforation or abscess without bleeding: Secondary | ICD-10-CM | POA: Diagnosis not present

## 2019-10-04 DIAGNOSIS — D125 Benign neoplasm of sigmoid colon: Secondary | ICD-10-CM | POA: Diagnosis not present

## 2019-10-04 HISTORY — PX: COLONOSCOPY: SHX174

## 2019-10-04 LAB — HM COLONOSCOPY

## 2019-10-11 ENCOUNTER — Encounter: Payer: Self-pay | Admitting: Osteopathic Medicine

## 2019-10-11 NOTE — Telephone Encounter (Signed)
Can wait, she's due but ok to wait another couple months, but I wouldn't wait longer than 6 mos total

## 2019-10-24 DIAGNOSIS — R140 Abdominal distension (gaseous): Secondary | ICD-10-CM | POA: Diagnosis not present

## 2019-10-24 DIAGNOSIS — K5792 Diverticulitis of intestine, part unspecified, without perforation or abscess without bleeding: Secondary | ICD-10-CM | POA: Diagnosis not present

## 2019-11-03 ENCOUNTER — Inpatient Hospital Stay: Admit: 2019-11-03 | Payer: Medicare Other | Admitting: Orthopedic Surgery

## 2019-11-03 SURGERY — CONVERSION, ARTHROPLASTY, KNEE, PARTIAL, TO TOTAL KNEE ARTHROPLASTY
Anesthesia: Spinal | Site: Knee | Laterality: Left

## 2019-11-12 LAB — CBC
HCT: 45.2 % — ABNORMAL HIGH (ref 35.0–45.0)
Hemoglobin: 14.8 g/dL (ref 11.7–15.5)
MCH: 29.4 pg (ref 27.0–33.0)
MCHC: 32.7 g/dL (ref 32.0–36.0)
MCV: 89.9 fL (ref 80.0–100.0)
MPV: 10.2 fL (ref 7.5–12.5)
Platelets: 308 10*3/uL (ref 140–400)
RBC: 5.03 10*6/uL (ref 3.80–5.10)
RDW: 13.4 % (ref 11.0–15.0)
WBC: 6.6 10*3/uL (ref 3.8–10.8)

## 2019-11-12 LAB — COMPLETE METABOLIC PANEL WITH GFR
AG Ratio: 1.4 (calc) (ref 1.0–2.5)
ALT: 23 U/L (ref 6–29)
AST: 23 U/L (ref 10–35)
Albumin: 4.2 g/dL (ref 3.6–5.1)
Alkaline phosphatase (APISO): 78 U/L (ref 37–153)
BUN: 15 mg/dL (ref 7–25)
CO2: 24 mmol/L (ref 20–32)
Calcium: 9.5 mg/dL (ref 8.6–10.4)
Chloride: 106 mmol/L (ref 98–110)
Creat: 0.66 mg/dL (ref 0.50–0.99)
GFR, Est African American: 104 mL/min/{1.73_m2} (ref >=60)
GFR, Est Non African American: 90 mL/min/{1.73_m2} (ref >=60)
Globulin: 3 g/dL (calc) (ref 1.9–3.7)
Glucose, Bld: 97 mg/dL (ref 65–99)
Potassium: 3.9 mmol/L (ref 3.5–5.3)
Sodium: 141 mmol/L (ref 135–146)
Total Bilirubin: 0.4 mg/dL (ref 0.2–1.2)
Total Protein: 7.2 g/dL (ref 6.1–8.1)

## 2019-11-12 LAB — URINALYSIS, ROUTINE W REFLEX MICROSCOPIC
Bilirubin Urine: NEGATIVE
Glucose, UA: NEGATIVE
Hgb urine dipstick: NEGATIVE
Ketones, ur: NEGATIVE
Leukocytes,Ua: NEGATIVE
Nitrite: NEGATIVE
Protein, ur: NEGATIVE
Specific Gravity, Urine: 1.014 (ref 1.001–1.03)
pH: <=5 (ref 5.0–8.0)

## 2019-11-12 LAB — HEMOGLOBIN A1C
Hgb A1c MFr Bld: 5.9 % of total Hgb — ABNORMAL HIGH (ref <5.7)
Mean Plasma Glucose: 123 (calc)
eAG (mmol/L): 6.8 (calc)

## 2019-11-12 LAB — PROTIME-INR
INR: 1.1
Prothrombin Time: 11.1 s (ref 9.0–11.5)

## 2019-11-23 ENCOUNTER — Other Ambulatory Visit: Payer: Self-pay

## 2019-11-23 MED ORDER — FLECAINIDE ACETATE 50 MG PO TABS: 50.0000 mg | ORAL_TABLET | Freq: Two times a day (BID) | ORAL | 1 refills | Status: DC

## 2019-11-29 ENCOUNTER — Other Ambulatory Visit: Payer: Self-pay

## 2019-11-29 DIAGNOSIS — R1013 Epigastric pain: Secondary | ICD-10-CM | POA: Diagnosis not present

## 2019-11-29 DIAGNOSIS — R1032 Left lower quadrant pain: Secondary | ICD-10-CM | POA: Diagnosis not present

## 2019-11-29 DIAGNOSIS — K5792 Diverticulitis of intestine, part unspecified, without perforation or abscess without bleeding: Secondary | ICD-10-CM | POA: Diagnosis not present

## 2019-11-29 DIAGNOSIS — R112 Nausea with vomiting, unspecified: Secondary | ICD-10-CM | POA: Diagnosis not present

## 2019-11-29 MED ORDER — METOPROLOL SUCCINATE ER 25 MG PO TB24: 25.0000 mg | ORAL_TABLET | Freq: Every day | ORAL | 3 refills | Status: DC

## 2019-12-05 DIAGNOSIS — K5792 Diverticulitis of intestine, part unspecified, without perforation or abscess without bleeding: Secondary | ICD-10-CM | POA: Diagnosis not present

## 2019-12-05 DIAGNOSIS — R112 Nausea with vomiting, unspecified: Secondary | ICD-10-CM | POA: Diagnosis not present

## 2019-12-05 DIAGNOSIS — R1013 Epigastric pain: Secondary | ICD-10-CM | POA: Diagnosis not present

## 2019-12-05 DIAGNOSIS — R10814 Left lower quadrant abdominal tenderness: Secondary | ICD-10-CM | POA: Diagnosis not present

## 2019-12-05 DIAGNOSIS — K5730 Diverticulosis of large intestine without perforation or abscess without bleeding: Secondary | ICD-10-CM | POA: Diagnosis not present

## 2019-12-05 DIAGNOSIS — R1032 Left lower quadrant pain: Secondary | ICD-10-CM | POA: Diagnosis not present

## 2019-12-05 DIAGNOSIS — D259 Leiomyoma of uterus, unspecified: Secondary | ICD-10-CM | POA: Diagnosis not present

## 2019-12-16 DIAGNOSIS — Z23 Encounter for immunization: Secondary | ICD-10-CM | POA: Diagnosis not present

## 2020-01-05 DIAGNOSIS — Z23 Encounter for immunization: Secondary | ICD-10-CM | POA: Diagnosis not present

## 2020-01-16 DIAGNOSIS — K5792 Diverticulitis of intestine, part unspecified, without perforation or abscess without bleeding: Secondary | ICD-10-CM | POA: Diagnosis not present

## 2020-01-16 DIAGNOSIS — K5900 Constipation, unspecified: Secondary | ICD-10-CM | POA: Diagnosis not present

## 2020-01-17 DIAGNOSIS — K5730 Diverticulosis of large intestine without perforation or abscess without bleeding: Secondary | ICD-10-CM | POA: Diagnosis not present

## 2020-01-17 DIAGNOSIS — D259 Leiomyoma of uterus, unspecified: Secondary | ICD-10-CM | POA: Diagnosis not present

## 2020-01-17 DIAGNOSIS — R1032 Left lower quadrant pain: Secondary | ICD-10-CM | POA: Diagnosis not present

## 2020-01-18 DIAGNOSIS — Z888 Allergy status to other drugs, medicaments and biological substances status: Secondary | ICD-10-CM | POA: Diagnosis not present

## 2020-01-18 DIAGNOSIS — I10 Essential (primary) hypertension: Secondary | ICD-10-CM | POA: Diagnosis not present

## 2020-01-18 DIAGNOSIS — Z882 Allergy status to sulfonamides status: Secondary | ICD-10-CM | POA: Diagnosis not present

## 2020-01-18 DIAGNOSIS — K760 Fatty (change of) liver, not elsewhere classified: Secondary | ICD-10-CM | POA: Diagnosis not present

## 2020-01-18 DIAGNOSIS — Z79899 Other long term (current) drug therapy: Secondary | ICD-10-CM | POA: Diagnosis not present

## 2020-01-18 DIAGNOSIS — I4891 Unspecified atrial fibrillation: Secondary | ICD-10-CM | POA: Diagnosis not present

## 2020-01-18 DIAGNOSIS — R1084 Generalized abdominal pain: Secondary | ICD-10-CM | POA: Diagnosis not present

## 2020-01-18 DIAGNOSIS — R109 Unspecified abdominal pain: Secondary | ICD-10-CM | POA: Diagnosis not present

## 2020-01-18 DIAGNOSIS — E785 Hyperlipidemia, unspecified: Secondary | ICD-10-CM | POA: Diagnosis not present

## 2020-01-18 DIAGNOSIS — Z87891 Personal history of nicotine dependence: Secondary | ICD-10-CM | POA: Diagnosis not present

## 2020-01-18 DIAGNOSIS — Z7901 Long term (current) use of anticoagulants: Secondary | ICD-10-CM | POA: Diagnosis not present

## 2020-01-18 DIAGNOSIS — F419 Anxiety disorder, unspecified: Secondary | ICD-10-CM | POA: Diagnosis not present

## 2020-01-18 DIAGNOSIS — R110 Nausea: Secondary | ICD-10-CM | POA: Diagnosis not present

## 2020-01-25 ENCOUNTER — Ambulatory Visit (INDEPENDENT_AMBULATORY_CARE_PROVIDER_SITE_OTHER): Payer: Medicare Other | Admitting: Osteopathic Medicine

## 2020-01-25 ENCOUNTER — Other Ambulatory Visit: Payer: Self-pay

## 2020-01-25 ENCOUNTER — Encounter: Payer: Self-pay | Admitting: Osteopathic Medicine

## 2020-01-25 VITALS — BP 118/83 | HR 81 | Temp 98.0°F | Wt 179.1 lb

## 2020-01-25 DIAGNOSIS — G8929 Other chronic pain: Secondary | ICD-10-CM

## 2020-01-25 DIAGNOSIS — R109 Unspecified abdominal pain: Secondary | ICD-10-CM | POA: Diagnosis not present

## 2020-01-25 NOTE — Patient Instructions (Signed)
Will redstart Prozac  Consider surgery referral if pain persists  Bring list to Dr Reesa Chew and hand it to the nurse ahead of the visit!

## 2020-01-25 NOTE — Progress Notes (Signed)
Melissa Rodgers is a 69 y.o. female who presents to  [name] at [name]  today, 01/25/20, seeking care for the following: . Abdominal pain - second opinion     ASSESSMENT & PLAN with other pertinent history/findings:  The encounter diagnosis was Chronic abdominal pain.   Likely multifactorial, affected by hx Nissen fundoplication, severe diverticulitis.   Ongoing epigastric pain, LUQ pain, LLQ pain  Following with GI Miralax helps some but not much Pain interfering with QOL  Feels like GI specialist means well but not addressing her pain  See pt instructions below  We discussed TCA to help w/ pain, metnal health  Pt would rather restart prozac Would consider GI referral possibly needs LOA   Patient Instructions  Will redstart Prozac  Consider surgery referral if pain persists  Bring list to Melissa Rodgers and hand it to the nurse ahead of the visit!         Follow-up instructions: Return in about 4 weeks (around 02/22/2020) for VIRTUAL VISIT or IN-OFFICE VISIT follow up GI / mental health .                                         BP 118/83 (BP Location: Left Arm, Patient Position: Sitting, Cuff Size: Normal)   Pulse 81   Temp 98 F (36.7 C) (Oral)   Wt 179 lb 1.9 oz (81.2 kg)   BMI 31.73 kg/m   Current Meds  Medication Sig  . ALPRAZolam (XANAX) 0.5 MG tablet Take 0.5-1 tablets (0.25-0.5 mg total) by mouth 2 (two) times daily as needed for anxiety.  Marland Kitchen apixaban (ELIQUIS) 5 MG TABS tablet Take 1 tablet (5 mg total) by mouth 2 (two) times daily.  . Cetirizine HCl 10 MG CAPS Take 5 mg by mouth daily.   . Cholecalciferol (VITAMIN D-3) 1000 units CAPS Take 1 capsule by mouth daily.   . clonazePAM (KLONOPIN) 0.5 MG tablet Take 1 tablet (0.5 mg total) by mouth 2 (two) times daily as needed for anxiety.  . Cyanocobalamin (B-12 PO) Take by mouth every other day.  . diclofenac sodium (VOLTAREN)  1 % GEL Apply 4 g topically 4 (four) times daily. To affected joint.  . flecainide (TAMBOCOR) 50 MG tablet Take 1 tablet (50 mg total) by mouth 2 (two) times daily.  Marland Kitchen losartan (COZAAR) 50 MG tablet Take 25 mg by mouth daily.  Marland Kitchen LYSINE PO Take 1 tablet by mouth daily as needed.   Marland Kitchen MAGNESIUM PO Take 0.5 mg by mouth every other day.  . metoprolol succinate (TOPROL XL) 25 MG 24 hr tablet Take 1 tablet (25 mg total) by mouth daily.  . Multiple Vitamins-Minerals (MULTIVITAMIN/EXTRA VITAMIN D3) Rodgers Rodgers by mouth.    No results found for this or any previous visit (from the past 72 hour(s)).  No results found.  Depression screen [facility] 2/9 08/29/2019 03/11/2019 09/09/2018  Decreased Interest 0 0 1  Down, Depressed, Hopeless 0 1 1  PHQ - 2 Score 0 1 2  Altered sleeping 1 - 1  Tired, decreased energy 2 - 2  Change in appetite 1 - 2  Feeling bad or failure about yourself  1 - 1  Trouble concentrating 0 - 1  Moving slowly or fidgety/restless 0 - 0  Suicidal thoughts 0 - 0  PHQ-9 Score 5 - 9    GAD 7 :  Generalized Anxiety Score 08/29/2019 03/11/2019  Nervous, Anxious, on Edge 1 2  Control/stop worrying 0 1  Worry too much - different things 1 1  Trouble relaxing 1 0  Restless 0 0  Easily annoyed or irritable 0 2  Afraid - awful might happen 1 1  Total GAD 7 Score 4 7  Anxiety Difficulty Somewhat difficult Not difficult at all      All questions at time of visit were answered - patient instructed to contact office with any additional concerns or updates.  ER/RTC precautions were reviewed with the patient.  Please note: voice recognition software was used to produce this document, and typos may escape review. Please contact Melissa. Sheppard Coil for any needed clarifications.   Total encounter time: 30 minutes.

## 2020-01-26 ENCOUNTER — Encounter: Payer: Self-pay | Admitting: Osteopathic Medicine

## 2020-01-26 DIAGNOSIS — Z5181 Encounter for therapeutic drug level monitoring: Secondary | ICD-10-CM

## 2020-01-26 DIAGNOSIS — Z79899 Other long term (current) drug therapy: Secondary | ICD-10-CM

## 2020-02-01 DIAGNOSIS — Z79899 Other long term (current) drug therapy: Secondary | ICD-10-CM | POA: Diagnosis not present

## 2020-02-01 DIAGNOSIS — Z5181 Encounter for therapeutic drug level monitoring: Secondary | ICD-10-CM | POA: Diagnosis not present

## 2020-02-03 LAB — FLECAINIDE LEVEL: Flecainide: 0.29 ug/mL (ref 0.20–1.00)

## 2020-02-15 ENCOUNTER — Encounter: Payer: Self-pay | Admitting: Osteopathic Medicine

## 2020-02-15 ENCOUNTER — Telehealth (INDEPENDENT_AMBULATORY_CARE_PROVIDER_SITE_OTHER): Payer: Medicare Other | Admitting: Osteopathic Medicine

## 2020-02-15 ENCOUNTER — Telehealth: Payer: Self-pay | Admitting: Gastroenterology

## 2020-02-15 VITALS — BP 104/74 | HR 65 | Temp 97.5°F | Wt 178.0 lb

## 2020-02-15 DIAGNOSIS — R197 Diarrhea, unspecified: Secondary | ICD-10-CM | POA: Diagnosis not present

## 2020-02-15 DIAGNOSIS — F418 Other specified anxiety disorders: Secondary | ICD-10-CM

## 2020-02-15 DIAGNOSIS — R7309 Other abnormal glucose: Secondary | ICD-10-CM

## 2020-02-15 DIAGNOSIS — E782 Mixed hyperlipidemia: Secondary | ICD-10-CM | POA: Diagnosis not present

## 2020-02-15 DIAGNOSIS — Z9889 Other specified postprocedural states: Secondary | ICD-10-CM | POA: Diagnosis not present

## 2020-02-15 DIAGNOSIS — I10 Essential (primary) hypertension: Secondary | ICD-10-CM

## 2020-02-15 DIAGNOSIS — Z8719 Personal history of other diseases of the digestive system: Secondary | ICD-10-CM

## 2020-02-15 DIAGNOSIS — Z87891 Personal history of nicotine dependence: Secondary | ICD-10-CM

## 2020-02-15 DIAGNOSIS — R109 Unspecified abdominal pain: Secondary | ICD-10-CM | POA: Diagnosis not present

## 2020-02-15 DIAGNOSIS — G8929 Other chronic pain: Secondary | ICD-10-CM

## 2020-02-15 MED ORDER — CLONAZEPAM 0.5 MG PO TABS: 0.2500 mg | ORAL_TABLET | Freq: Two times a day (BID) | ORAL | 0 refills | Status: DC | PRN

## 2020-02-15 NOTE — Telephone Encounter (Signed)
Dr. Bryan Lemma, there is a referral from Dr. [name] to you to evaluate patient to hx of diverticulitis, hx of Nissen fundoplication, chronic abd pain, diarrhea.  Pt requested to transfer care because she feels that she has not gotten better.  Her records are available in [facility] for review.  Please advise scheduling.

## 2020-02-15 NOTE — Progress Notes (Signed)
Virtual Visit via Video (App used: MyChart) Note  I connected with      Melissa Rodgers on 02/15/20 at 9:51 AM  by a telemedicine application and verified that I am speaking with the correct person using two identifiers.  Patient is at home I am in office   I discussed the limitations of evaluation and management by telemedicine and the availability of in person appointments. The patient expressed understanding and agreed to proceed.  History of Present Illness: Melissa Rodgers is a 69 y.o. female who would like to discuss follow-up GI issues and mental health. See details in A/P below.     ASSESSMENT/PLAN: The primary encounter diagnosis was History of diverticulitis. Diagnoses of Anxiety with depression, History of Nissen fundoplication, Chronic abdominal pain, Diarrhea, unspecified type, Mixed hyperlipidemia, Essential hypertension, and Elevated hemoglobin A1c were also pertinent to this visit.   Chronic abdominal pain, felt like she was not making progress with GI visits. Pain continued after our visit but improved around 01/29/20, with intermittent "hints" of pain. She'd like a referral to another GI practice for second opinion. Chronic diarrhea, loose stool, stable for about a year, several BM per day, previous GI doc did not seem concerned about this. She found when she was having the pain, she took the Clonazepam 0.25-0.5 tablet as needed, hasn't taken any since pain has improved.  --> refilled Klonopin --> GI referral to [facility] was also a struggle at last visit. We considered restart Prozac and scheduled follow-up today to check in. PHQ/GAD not much difference in symptoms. She decided not to resume the Prozax.  Not comfortable at this time given her cardiac meds. Would like to discuss w/ cardiology - this is certainly reasonable given the optential for issues with drug-drug interactions on flecainide.  --> no new meds for now --> ok to continue prn benzos,  sparing use     Labs prior to upcoming medicare wellness visit    Orders Placed This Encounter  Procedures  . CBC  . Lipid panel  . COMPLETE METABOLIC PANEL WITH GFR  . Hemoglobin A1c  . Ambulatory referral to Gastroenterology     Depression screen [facility] 2/9 02/15/2020 08/29/2019 03/11/2019  Decreased Interest 0 0 0  Down, Depressed, Hopeless 1 0 1  PHQ - 2 Score 1 0 1  Altered sleeping 1 1 -  Tired, decreased energy 2 2 -  Change in appetite 0 1 -  Feeling bad or failure about yourself  0 1 -  Trouble concentrating 1 0 -  Moving slowly or fidgety/restless 1 0 -  Suicidal thoughts 0 0 -  PHQ-9 Score 6 5 -  Difficult doing work/chores Not difficult at all - -   GAD 7 : Generalized Anxiety Score 02/15/2020 08/29/2019 03/11/2019  Nervous, Anxious, on Edge 1 1 2   Control/stop worrying 1 0 1  Worry too much - different things 1 1 1   Trouble relaxing 0 1 0  Restless 0 0 0  Easily annoyed or irritable 1 0 2  Afraid - awful might happen 1 1 1   Total GAD 7 Score 5 4 7   Anxiety Difficulty Not difficult at all Somewhat difficult Not difficult at all       Observations/Objective: BP 104/74   Pulse 65   Temp (!) 97.5 F (36.4 C) (Oral)   Wt 178 lb (80.7 kg)   BMI 31.53 kg/m  BP Readings from Last 3 Encounters:  02/15/20 104/74  01/25/20 118/83  09/14/19 128/88   Exam: Normal Speech.  NAD  Lab and Radiology Results No results found for this or any previous visit (from the past 72 hour(s)). No results found.  Follow Up Instructions: Return for KEEP CURRENTLY SCHEDULED APPOINTMENT - PLEASE ENSURE VISIT IS VIRTUAL & 40 MIN.    I discussed the assessment and treatment plan with the patient. The patient was provided an opportunity to ask questions and all were answered. The patient agreed with the plan and demonstrated an understanding of the instructions.   The patient was advised to call back or seek an in-person evaluation if any new concerns, if symptoms worsen or if  the condition fails to improve as anticipated.  30 minutes of non-face-to-face time was provided during this encounter.      . . . . . . . . . . . . . Marland Kitchen                   Historical information moved to improve visibility of documentation.  Past Medical History:  Diagnosis Date  . Anxiety   . Arthritis   . Dyslipidemia   . GERD (gastroesophageal reflux disease)   . Hypertension   . Subarachnoid hemorrhage following injury [facility])    Past Surgical History:  Procedure Laterality Date  . HERNIA REPAIR    . MEDIAL PARTIAL KNEE REPLACEMENT Right 12/14/2014  . NISSEN FUNDOPLICATION  [id number]  . OOPHORECTOMY    . PARTIAL KNEE ARTHROPLASTY Left 04/05/2014   Social History   Tobacco Use  . Smoking status: Former Smoker    Types: Cigarettes    Quit date: 08/18/1983    Years since quitting: 36.5  . Smokeless tobacco: Never Used  Substance Use Topics  . Alcohol use: Yes    Comment: 7 -10 drinks per week    family history includes Alzheimer's disease in her father; Atrial fibrillation in her sister; Diabetes in her mother; Heart disease in her mother.  Medications: Current Outpatient Medications  Medication Sig Dispense Refill  . acetaminophen-codeine (TYLENOL #3) 300-30 MG tablet Take 1 tablet by mouth every 4 (four) hours as needed.    . ALPRAZolam (XANAX) 0.5 MG tablet Take 0.5-1 tablets (0.25-0.5 mg total) by mouth 2 (two) times daily as needed for anxiety. 30 tablet 0  . amoxicillin (AMOXIL) 500 MG capsule TAKE 4 CAPSULES 1 HOURS BEFORE DENTAL APPOINTMENTS.    Marland Kitchen amoxicillin-clavulanate (AUGMENTIN) 875-125 MG tablet Take 1 tablet by mouth 2 (two) times daily.    Marland Kitchen apixaban (ELIQUIS) 5 MG TABS tablet Take 1 tablet (5 mg total) by mouth 2 (two) times daily. 180 tablet 1  . Cetirizine HCl 10 MG CAPS Take 5 mg by mouth daily.     . Cholecalciferol (VITAMIN D-3) 1000 units CAPS Take 1 capsule by mouth daily.     . clonazePAM (KLONOPIN) 0.5 MG tablet  Take 1 tablet (0.5 mg total) by mouth 2 (two) times daily as needed for anxiety. 30 tablet 0  . Cyanocobalamin (B-12 PO) Take by mouth every other day.    . diclofenac sodium (VOLTAREN) 1 % GEL Apply 4 g topically 4 (four) times daily. To affected joint. 100 g 11  . diclofenac Sodium (VOLTAREN) 1 % GEL Place onto the skin.    . flecainide (TAMBOCOR) 50 MG tablet Take 1 tablet (50 mg total) by mouth 2 (two) times daily. 180 tablet 1  . losartan (COZAAR) 50 MG tablet Take 25 mg by mouth daily.    Marland Kitchen LYSINE  PO Take 1 tablet by mouth daily as needed.     . metoprolol succinate (TOPROL XL) 25 MG 24 hr tablet Take 1 tablet (25 mg total) by mouth daily. 30 tablet 3  . Multiple Vitamins-Minerals (MULTIVITAMIN/EXTRA VITAMIN D3) CHEW Chew by mouth.    Marland Kitchen MAGNESIUM PO Take 0.5 mg by mouth every other day.     No current facility-administered medications for this visit.   Allergies  Allergen Reactions  . Rosuvastatin Other (See Comments)    Other reaction(s): Musculoskeletal Aches Muscle spasms  . Sulfa Antibiotics Rash and Other (See Comments)

## 2020-02-21 NOTE — Telephone Encounter (Signed)
A message was left for pt to call back to schedule.  °

## 2020-02-21 NOTE — Telephone Encounter (Signed)
Records in care everywhere reviewed. Was seen by GI at [facility] on 01/16/2020 for follow-up of diverticulitis, diagnosed in the ER in 07/2019 with CT scan notable for severe acute sigmoid diverticulitis, treated with antibiotics with clinical improvement. Colonoscopy in 09/2019 with moderate diverticulosis of the descending and sigmoid colon, lipoma, internal hemorrhoids, and inflammatory polyp of the sigmoid colon. Did endorse some abdominal bloating at that time, but pain was improved. Was treated with MiraLAX daily, then recurrence of LLQ tenderness on follow-up in 11/2019. CT on 12/05/2019 was essentially normal, with resolution of sigmoid diverticulitis. Was treated with 14-day course of Augmentin.   At follow-up appointment on 01/16/2020 was still using MiraLAX daily with at least one BM/day. Still with abdominal cramping at times, especially if she stops MiraLAX. Some abdominal pain intermittently and specifically in the epigastrium. Denies nausea, vomiting, hematemesis, melena, fever, chills. Was using Gas-X three times/day for gas/bloating. Epigastric pain resolved with clonazepam as needed.  Was started on Benefiber daily, continued MiraLAX one cap/day with titration of each as needed. 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  Has had a thorough work-up at [place], but now requesting transfer of care. Okay to set up routine appointment with me.

## 2020-02-22 ENCOUNTER — Encounter: Payer: Self-pay | Admitting: Gastroenterology

## 2020-02-22 DIAGNOSIS — R109 Unspecified abdominal pain: Secondary | ICD-10-CM | POA: Diagnosis not present

## 2020-02-22 DIAGNOSIS — F418 Other specified anxiety disorders: Secondary | ICD-10-CM | POA: Diagnosis not present

## 2020-02-22 DIAGNOSIS — R7309 Other abnormal glucose: Secondary | ICD-10-CM | POA: Diagnosis not present

## 2020-02-22 DIAGNOSIS — G8929 Other chronic pain: Secondary | ICD-10-CM | POA: Diagnosis not present

## 2020-02-22 DIAGNOSIS — I10 Essential (primary) hypertension: Secondary | ICD-10-CM | POA: Diagnosis not present

## 2020-02-22 DIAGNOSIS — E782 Mixed hyperlipidemia: Secondary | ICD-10-CM | POA: Diagnosis not present

## 2020-02-22 DIAGNOSIS — R197 Diarrhea, unspecified: Secondary | ICD-10-CM | POA: Diagnosis not present

## 2020-02-22 DIAGNOSIS — Z9889 Other specified postprocedural states: Secondary | ICD-10-CM | POA: Diagnosis not present

## 2020-02-22 DIAGNOSIS — Z8719 Personal history of other diseases of the digestive system: Secondary | ICD-10-CM | POA: Diagnosis not present

## 2020-02-23 ENCOUNTER — Other Ambulatory Visit: Payer: Self-pay | Admitting: Osteopathic Medicine

## 2020-02-23 LAB — COMPLETE METABOLIC PANEL WITH GFR
AG Ratio: 1.6 (calc) (ref 1.0–2.5)
ALT: 22 U/L (ref 6–29)
AST: 21 U/L (ref 10–35)
Albumin: 4.3 g/dL (ref 3.6–5.1)
Alkaline phosphatase (APISO): 75 U/L (ref 37–153)
BUN: 14 mg/dL (ref 7–25)
CO2: 26 mmol/L (ref 20–32)
Calcium: 9.2 mg/dL (ref 8.6–10.4)
Chloride: 108 mmol/L (ref 98–110)
Creat: 0.65 mg/dL (ref 0.50–0.99)
GFR, Est African American: 105 mL/min/{1.73_m2} (ref >=60)
GFR, Est Non African American: 91 mL/min/{1.73_m2} (ref >=60)
Globulin: 2.7 g/dL (calc) (ref 1.9–3.7)
Glucose, Bld: 89 mg/dL (ref 65–99)
Potassium: 3.9 mmol/L (ref 3.5–5.3)
Sodium: 141 mmol/L (ref 135–146)
Total Bilirubin: 0.7 mg/dL (ref 0.2–1.2)
Total Protein: 7 g/dL (ref 6.1–8.1)

## 2020-02-23 LAB — CBC
HCT: 43.4 % (ref 35.0–45.0)
Hemoglobin: 14.2 g/dL (ref 11.7–15.5)
MCH: 29.5 pg (ref 27.0–33.0)
MCHC: 32.7 g/dL (ref 32.0–36.0)
MCV: 90 fL (ref 80.0–100.0)
MPV: 10.1 fL (ref 7.5–12.5)
Platelets: 280 10*3/uL (ref 140–400)
RBC: 4.82 10*6/uL (ref 3.80–5.10)
RDW: 13.3 % (ref 11.0–15.0)
WBC: 5.9 10*3/uL (ref 3.8–10.8)

## 2020-02-23 LAB — HEMOGLOBIN A1C
Hgb A1c MFr Bld: 5.8 % of total Hgb — ABNORMAL HIGH (ref <5.7)
Mean Plasma Glucose: 120 (calc)
eAG (mmol/L): 6.6 (calc)

## 2020-02-23 LAB — LIPID PANEL
Cholesterol: 220 mg/dL — ABNORMAL HIGH (ref <200)
HDL: 48 mg/dL — ABNORMAL LOW (ref >=50)
LDL Cholesterol (Calc): 135 mg/dL (calc) — ABNORMAL HIGH
Non-HDL Cholesterol (Calc): 172 mg/dL (calc) — ABNORMAL HIGH (ref <130)
Total CHOL/HDL Ratio: 4.6 (calc) (ref <5.0)
Triglycerides: 229 mg/dL — ABNORMAL HIGH (ref <150)

## 2020-02-23 MED ORDER — ICOSAPENT ETHYL 1 G PO CAPS: 2.0000 g | ORAL_CAPSULE | Freq: Two times a day (BID) | ORAL | 3 refills | Status: DC

## 2020-02-27 ENCOUNTER — Other Ambulatory Visit: Payer: Self-pay | Admitting: Cardiology

## 2020-02-28 ENCOUNTER — Encounter: Payer: Self-pay | Admitting: Osteopathic Medicine

## 2020-02-28 ENCOUNTER — Telehealth (INDEPENDENT_AMBULATORY_CARE_PROVIDER_SITE_OTHER): Payer: Medicare Other | Admitting: Osteopathic Medicine

## 2020-02-28 VITALS — BP 93/65 | HR 71 | Temp 97.5°F | Ht 63.0 in | Wt 177.5 lb

## 2020-02-28 DIAGNOSIS — R413 Other amnesia: Secondary | ICD-10-CM | POA: Diagnosis not present

## 2020-02-28 DIAGNOSIS — Z820 Family history of epilepsy and other diseases of the nervous system: Secondary | ICD-10-CM | POA: Diagnosis not present

## 2020-02-28 DIAGNOSIS — H9190 Unspecified hearing loss, unspecified ear: Secondary | ICD-10-CM

## 2020-02-28 NOTE — Progress Notes (Signed)
HPI: Melissa Rodgers is a 69 y.o. female  who presents virtually (via MyChart) to [place] today, 02/28/20,  for Medicare Annual Wellness Exam  Patient presents for annual physical/Medicare wellness exam. Few other concerns / complaints today.   Vascepa Rx: reluctant to take this, cost is a bit much, Side effects - Afib, bleeding, constipation. Cholesterol has come down since last time.   Referral to [facility] scheduled   If decided on cognitive testing, family     Past medical, surgical, social and family history reviewed:  Patient Active Problem List   Diagnosis Date Noted  . Anxiety with depression 09/09/2018  . Dry eye syndrome of both eyes 09/09/2018  . Microscopic hematuria 09/09/2018  . History of repair of hiatal hernia 09/09/2018  . Non-seasonal allergic rhinitis 09/09/2018  . History of palpitations 09/09/2018  . Mixed hyperlipidemia 09/09/2018  . Essential hypertension 09/09/2018    Past Surgical History:  Procedure Laterality Date  . HERNIA REPAIR    . MEDIAL PARTIAL KNEE REPLACEMENT Right 12/14/2014  . NISSEN FUNDOPLICATION  [id number]  . OOPHORECTOMY    . PARTIAL KNEE ARTHROPLASTY Left 04/05/2014    Social History   Socioeconomic History  . Marital status: Married    Spouse name: Not on file  . Number of children: 0  . Years of education: Not on file  . Highest education level: Not on file  Occupational History  . Not on file  Tobacco Use  . Smoking status: Former Smoker    Types: Cigarettes    Quit date: 08/18/1983    Years since quitting: 36.5  . Smokeless tobacco: Never Used  Substance and Sexual Activity  . Alcohol use: Yes    Comment: 7 -10 drinks per week   . Drug use: Never  . Sexual activity: Yes    Partners: Male    Birth control/protection: None  Other Topics Concern  . Not on file  Social History Narrative  . Not on file   Social Determinants of Health   Financial Resource Strain:   .  Difficulty of Paying Living Expenses:   Food Insecurity:   . Worried About Charity fundraiser in the Last Year:   . Arboriculturist in the Last Year:   Transportation Needs:   . Film/video editor (Medical):   Marland Kitchen Lack of Transportation (Non-Medical):   Physical Activity:   . Days of Exercise per Week:   . Minutes of Exercise per Session:   Stress:   . Feeling of Stress :   Social Connections:   . Frequency of Communication with Friends and Family:   . Frequency of Social Gatherings with Friends and Family:   . Attends Religious Services:   . Active Member of Clubs or Organizations:   . Attends Archivist Meetings:   Marland Kitchen Marital Status:   Intimate Partner Violence:   . Fear of Current or Ex-Partner:   . Emotionally Abused:   Marland Kitchen Physically Abused:   . Sexually Abused:     Family History  Problem Relation Age of Onset  . Diabetes Mother   . Heart disease Mother   . Alzheimer's disease Father   . Atrial fibrillation Sister      Current medication list and allergy/intolerance information reviewed:    Outpatient Encounter Medications as of 02/28/2020  Medication Sig  . acetaminophen-codeine (TYLENOL #3) 300-30 MG tablet Take 1 tablet by mouth every 4 (four) hours as needed.  Marland Kitchen amoxicillin (  AMOXIL) 500 MG capsule TAKE 4 CAPSULES 1 HOURS BEFORE DENTAL APPOINTMENTS.  Marland Kitchen apixaban (ELIQUIS) 5 MG TABS tablet Take 1 tablet (5 mg total) by mouth 2 (two) times daily.  . Cetirizine HCl 10 MG CAPS Take 5 mg by mouth daily.   . Cholecalciferol (VITAMIN D-3) 1000 units CAPS Take 1 capsule by mouth daily.   . clonazePAM (KLONOPIN) 0.5 MG tablet Take 0.5-1 tablets (0.25-0.5 mg total) by mouth 2 (two) times daily as needed for anxiety.  . Cyanocobalamin (B-12 PO) Take by mouth every other day.  . diclofenac sodium (VOLTAREN) 1 % GEL Apply 4 g topically 4 (four) times daily. To affected joint.  . diclofenac Sodium (VOLTAREN) 1 % GEL Place onto the skin.  . flecainide (TAMBOCOR) 50 MG  tablet Take 1 tablet (50 mg total) by mouth 2 (two) times daily.  Marland Kitchen icosapent Ethyl (VASCEPA) 1 g capsule Take 2 capsules (2 g total) by mouth 2 (two) times daily.  Marland Kitchen losartan (COZAAR) 50 MG tablet Take 25 mg by mouth daily.  Marland Kitchen LYSINE PO Take 1 tablet by mouth daily as needed.   Marland Kitchen MAGNESIUM PO Take 0.5 mg by mouth every other day.  . metoprolol succinate (TOPROL XL) 25 MG 24 hr tablet Take 1 tablet (25 mg total) by mouth daily.  . Multiple Vitamins-Minerals (MULTIVITAMIN/EXTRA VITAMIN D3) CHEW Chew by mouth.   No facility-administered encounter medications on file as of 02/28/2020.    Allergies  Allergen Reactions  . Rosuvastatin Other (See Comments)    Other reaction(s): Musculoskeletal Aches Muscle spasms  . Sulfa Antibiotics Rash and Other (See Comments)       Review of Systems: Review of Systems - negative   Medicare Wellness Questionnaire  Are there smokers in your home (other than you)? no  Depression Screen (Note: if answer to either of the following is "Yes", a more complete depression screening is indicated)   Q1: Over the past two weeks, have you felt down, depressed or hopeless? no  Q2: Over the past two weeks, have you felt little interest or pleasure in doing things? no  Have you lost interest or pleasure in daily life? no  Do you often feel hopeless? never  Do you cry easily over simple problems? no  Activities of Daily Living In your present state of health, do you have any difficulty performing the following activities?:  Driving? no Managing money?  no Feeding yourself? no Getting from bed to chair? no Climbing a flight of stairs? no Preparing food and eating?: no Bathing or showering? no Getting dressed: no Getting to the toilet? no Using the toilet: no Moving around from place to place: no In the past year have you fallen or had a near fall?: yes, tripped over a curb, got up fine, mild knee briose   Hearing Difficulties:  Do you often ask people  to speak up or repeat themselves? sometimes Do you experience ringing or noises in your ears? no  Do you have difficulty understanding soft or whispered voices? yes  Memory Difficulties:  Do you feel that you have a problem with memory? yes  Do you often misplace items? yes  Do you feel safe at home?  yes  Sexual Health:   Are you sexually active?  Yes  Do you have more than one partner?  No  Advanced Directives:   Advanced directives discussed: has an advanced directive - a copy HAS NOT been provided.  Additional information provided: yes  Risk Factors  Current exercise habits: minimal, doing some yeard work, planning on starting walking again but knee issues   Dietary issues discussed: pescatarian, butter is only saturated fat they indulge in   Cardiac risk factors: hypercholesterolemia/hyperlipidemia, arrhythmia, positive family history   Exam:  BP 93/65   Pulse 71   Temp (!) 97.5 F (36.4 C) (Oral)   Ht 5\' 3"  (1.6 m)   Wt 177 lb 8 oz (80.5 kg)   BMI 31.44 kg/m  Vision by Snellen chart: right eye:see nurse notes, left eye:see nurse notes  Constitutional: VS see above. General Appearance: alert, well-developed, well-nourished, NAD  Ears, Nose, Mouth, Throat: MMM  Neck: No masses, trachea midline.   Respiratory: Normal respiratory effort. no wheeze, no rhonchi, no rales  Cardiovascular:No lower extremity edema.   Musculoskeletal: Gait normal. No clubbing/cyanosis of digits.   Neurological: Normal balance/coordination. No tremor. Recalls 3 objects and able to read face of watch with correct time.   Skin: warm, dry, intact. No rash/ulcer.   Psychiatric: Normal judgment/insight. Normal mood and affect. Oriented x3.     ASSESSMENT/PLAN:   Encounter for Medicare annual wellness exam    CANCER SCREENING  Lung - USPSTF: [id number] w/ 30 py hx unless quit w/in 89yr - does not need  Colon - need records   Breast - does not need  Cervical - does not  need OTHER DISEASE SCREENING  Lipid - does not need  DM2 - does not need  AAA - female 56-75yo ever smoked - does not need  Osteoporosis - women 70yo+, men 70yo+ - needs INFECTIOUS DISEASE SCREENING  HIV - does not need  GC/CT - does not need  HepC -does not need  TB - does not need ADULT VACCINATION  Influenza - annual vaccine recommended  Td - booster every 10 years - does not need  Zoster - option at 50, yes at 65+ - does not need  PCV13 - does not need  PPSV23 - does not need Immunization History  Administered Date(s) Administered  . Fluad Quad(high Dose 65+) 08/29/2019  . Influenza Whole 09/13/2014  . Influenza, High Dose Seasonal PF 10/07/2016, 09/15/2017, 09/09/2018  . Influenza,inj,Quad PF,6+ Mos 09/26/2015  . Influenza,inj,quad, With Preservative 09/09/2013  . PFIZER SARS-COV-2 Vaccination 12/16/2019, 01/05/2020  . Pneumococcal Conjugate-13 08/10/2015  . Pneumococcal Polysaccharide-23 10/14/2013  . Tdap 03/20/2015  . Zoster 01/27/2013   OTHER  Fall - exercise and Vit D age 69+ - does not need  Advanced Directives -  Discussed as above   During the course of the visit the patient was educated and counseled about appropriate screening and preventive services as noted above.   Patient Instructions (the written plan) was given to the patient.  Medicare Attestation I have personally reviewed: The patient's medical and social history Their use of alcohol, tobacco or illicit drugs Their current medications and supplements The patient's functional ability including ADLs,fall risks, home safety risks, cognitive, and hearing and visual impairment Diet and physical activities Evidence for depression or mood disorders  The patient's weight, height, BMI, and visual acuity have been recorded in the chart.  I have made referrals, counseling, and provided education to the patient based on review of the above and I have provided the patient with a written  personalized care plan for preventive services.     [name], DO   02/28/20

## 2020-02-29 ENCOUNTER — Other Ambulatory Visit: Payer: Self-pay | Admitting: Cardiology

## 2020-03-02 ENCOUNTER — Encounter: Payer: Self-pay | Admitting: Osteopathic Medicine

## 2020-03-05 ENCOUNTER — Ambulatory Visit (INDEPENDENT_AMBULATORY_CARE_PROVIDER_SITE_OTHER): Payer: Medicare Other | Admitting: Psychology

## 2020-03-05 ENCOUNTER — Encounter: Payer: Self-pay | Admitting: Psychology

## 2020-03-05 ENCOUNTER — Other Ambulatory Visit: Payer: Self-pay

## 2020-03-05 ENCOUNTER — Ambulatory Visit: Payer: Medicare Other | Admitting: Psychology

## 2020-03-05 DIAGNOSIS — F411 Generalized anxiety disorder: Secondary | ICD-10-CM | POA: Diagnosis not present

## 2020-03-05 DIAGNOSIS — R4189 Other symptoms and signs involving cognitive functions and awareness: Secondary | ICD-10-CM

## 2020-03-05 DIAGNOSIS — F410 Panic disorder [episodic paroxysmal anxiety] without agoraphobia: Secondary | ICD-10-CM

## 2020-03-05 DIAGNOSIS — M797 Fibromyalgia: Secondary | ICD-10-CM | POA: Insufficient documentation

## 2020-03-05 DIAGNOSIS — M25569 Pain in unspecified knee: Secondary | ICD-10-CM

## 2020-03-05 DIAGNOSIS — F330 Major depressive disorder, recurrent, mild: Secondary | ICD-10-CM | POA: Diagnosis not present

## 2020-03-05 DIAGNOSIS — M1990 Unspecified osteoarthritis, unspecified site: Secondary | ICD-10-CM | POA: Insufficient documentation

## 2020-03-05 HISTORY — DX: Pain in unspecified knee: M25.569

## 2020-03-05 NOTE — Progress Notes (Signed)
NEUROPSYCHOLOGICAL EVALUATION [place]. [place] Department of Neurology  Reason for Referral:   Melissa Rodgers is a 69 y.o. left-handed Caucasian female referred by [name], D.O., to characterize her current cognitive functioning and assist with diagnostic clarity and treatment planning in the context of subjective cognitive decline and a family history of Alzheimer's diease.  Assessment and Plan:   Clinical Impression(s): [name]'s pattern of performance is suggestive of neuropsychological functioning within normal limits with many areas of cognitive strength. A relative weakness was noted across a task assessing semantic fluency; however, her score was firmly in the average normative range and not cause for concern. Performance across domains of processing speed, attention/concentration, executive functioning, receptive and expressive language, visuospatial functioning, and learning and memory were consistently within normal limits. [name] further denied difficulties completing instrumental activities of daily living (ADLs) independently.  Factors which can create and maintain day-to-day cognitive inefficiencies include ongoing psychiatric distress (she reported mild symptoms of both anxiety and depression occurring within the past 1-2 weeks), mild sleep dysfunction, and traits of attention-deficit hyperactivity disorder (ADHD). It is likely that a combination of these factors are responsible for day-to-day cognitive difficulties which [name] has been experiencing presently. Specific to memory, she was able to learn novel verbal and visual information efficiently and retain this knowledge after lengthy delays. Overall, memory performance combined with intact performances across other areas of cognitive functioning is not suggestive of Alzheimer's disease. Likewise, her cognitive and behavioral profile is not suggestive of any other form of  neurodegenerative illness presently.  Recommendations: Should she experience increased concerns surrounding her cognitive functioning or exhibit functional decline in the future, a repeat neuropsychological evaluation would be warranted at that time. The current evaluation will serve as an excellent baseline to draw future comparisons against.  A combination of medication and psychotherapy has been shown to be most effective at treating symptoms of anxiety and depression. As such, [name] is encouraged to speak with her prescribing physician regarding medication adjustments to optimally manage these symptoms. Likewise, [name] is encouraged to consider engaging in short-term psychotherapy to address symptoms of psychiatric distress. She would benefit from an active and collaborative therapeutic environment, rather than one purely supportive in nature. Recommended treatment modalities include Cognitive Behavioral Therapy (CBT) or Acceptance and Commitment Therapy (ACT).  [name] is encouraged to attend to lifestyle factors for brain health (e.g., regular physical exercise, good nutrition habits, regular participation in cognitively-stimulating activities, and general stress management techniques), which are likely to have benefits for both emotional adjustment and cognition. In fact, in addition to promoting good general health, regular exercise incorporating aerobic activities (e.g., brisk walking, jogging, cycling, etc.) has been demonstrated to be a very effective treatment for depression and stress, with similar efficacy rates to both antidepressant medication and psychotherapy. Optimal control of vascular risk factors (including safe cardiovascular exercise and adherence to dietary recommendations) is encouraged.  If interested, there are some activities which have therapeutic value and can be useful in keeping her cognitively stimulated. For suggestions, [name] is encouraged to go  to the following website: https://www.[facility].org/get-to-know-barrow/centers-programs/neurorehabilitation-center/neuro-rehab-apps-and-games/ which has options, categorized by level of difficulty. It should be noted that these activities should not be viewed as a substitute for therapy.  For day-to-day problems recalling information, she may benefit from using strategies to aid with her learning and memory, such as asking questions for clarification, requesting that information to be repeated, or repeating an explanation in her own words to  ensure comprehension and promote encoding.    Memory can be improved using internal strategies such as rehearsal, repetition, chunking, mnemonics, association, and imagery. External strategies such as written notes in a consistently used memory journal, visual and nonverbal auditory cues such as a calendar on the refrigerator or appointments with alarm, such as on a cell phone, can also help maximize recall.    To address problems with fluctuating attention, she may wish to consider:   -Avoiding external distractions when needing to concentrate   -Limiting exposure to fast paced environments with multiple sensory demands   -Writing down complicated information and using checklists   -Attempting and completing one task at a time (i.e., no multi-tasking)   -Verbalizing aloud each step of a task to maintain focus   -Reducing the amount of information considered at one time  Review of Records:   [name] was seen by her PCP [name], D.O.) on 02/28/2020 for follow-up of several medical and psychiatric conditions. Ongoing depressive symptoms were highlighted, as well as a history of anxiety. [name] reported ongoing difficulties with short-term memory (e.g., feeling as though she has a problem with her memory and misplacing items). Subsequently, she was referred for a comprehensive neuropsychological evaluation to characterize her cognitive  abilities and to assist with diagnostic clarity and treatment planning.   Head CT on 08/04/2019 revealed mild diffuse cerebral and cerebellar atrophy.   Past Medical History:  Diagnosis Date  . Atrial fibrillation 06/18/2019  . Cherry angioma 04/17/2016  . Chronic bilateral low back pain without sciatica 10/07/2016  . 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  . Diverticulitis of sigmoid colon 08/01/2019  . Essential hypertension 09/09/2018  . Fibromyalgia   . Gastroesophageal reflux disease without esophagitis 11/06/2015  . Generalized anxiety disorder with panic attacks 08/01/2019  . History of palpitations 09/09/2018  . Hyperlipidemia 09/09/2018  . Major depressive disorder 09/09/2018  . Osteoarthritis   . S/P laparoscopic fundoplication [id number]  . Sebaceous hyperplasia 04/17/2016  . Subarachnoid hemorrhage following injury 2010   Fall from horse; brief hospitalization    Past Surgical History:  Procedure Laterality Date  . HERNIA REPAIR    . MEDIAL PARTIAL KNEE REPLACEMENT Right 12/14/2014  . NISSEN FUNDOPLICATION  [id number]  . OOPHORECTOMY    . PARTIAL KNEE ARTHROPLASTY Left 04/05/2014    Current Outpatient Medications:  .  acetaminophen-codeine (TYLENOL #3) 300-30 MG tablet, Take 1 tablet by mouth every 4 (four) hours as needed., Disp: , Rfl:  .  amoxicillin (AMOXIL) 500 MG capsule, TAKE 4 CAPSULES 1 HOURS BEFORE DENTAL APPOINTMENTS., Disp: , Rfl:  .  apixaban (ELIQUIS) 5 MG TABS tablet, Take 1 tablet (5 mg total) by mouth 2 (two) times daily., Disp: 180 tablet, Rfl: 1 .  Cetirizine HCl 10 MG CAPS, Take 5 mg by mouth daily. , Disp: , Rfl:  .  Cholecalciferol (VITAMIN D-3) 1000 units CAPS, Take 1 capsule by mouth daily. , Disp: , Rfl:  .  clonazePAM (KLONOPIN) 0.5 MG tablet, Take 0.5-1 tablets (0.25-0.5 mg total) by mouth 2 (two) times daily as needed for anxiety., Disp: 30 tablet, Rfl: 0 .   Cyanocobalamin (B-12 PO), Take by mouth every other day., Disp: , Rfl:  .  diclofenac Sodium (VOLTAREN) 1 % GEL, Place onto the skin., Disp: , Rfl:  .  flecainide (TAMBOCOR) 50 MG tablet, Take 1 tablet (50 mg total) by mouth 2 (two) times daily., Disp: 180 tablet,  Rfl: 1 .  icosapent Ethyl (VASCEPA) 1 g capsule, Take 2 capsules (2 g total) by mouth 2 (two) times daily., Disp: 180 capsule, Rfl: 3 .  losartan (COZAAR) 50 MG tablet, Take 25 mg by mouth daily., Disp: , Rfl:  .  LYSINE PO, Take 1 tablet by mouth daily as needed. , Disp: , Rfl:  .  MAGNESIUM PO, Take 0.5 mg by mouth every other day., Disp: , Rfl:  .  metoprolol succinate (TOPROL-XL) 25 MG 24 hr tablet, Take 1 tablet (25 mg total) by mouth daily. NEED TO CALL OFFICE TO SCHEDULE APPOINTMENT FOR FURTHER REFILLS, Disp: 90 tablet, Rfl: 0 .  Multiple Vitamins-Minerals (MULTIVITAMIN/EXTRA VITAMIN D3) CHEW, Chew by mouth., Disp: , Rfl:   Clinical Interview:   Cognitive Symptoms: Decreased short-term memory: Endorsed. She reported some trouble remembering the details of previous conversations and misplacing items around the home. While the latter was described as longstanding in nature, she reported her belief that memory difficulties have worsened over the years. Despite this, she stated that her husband feels that things have been stable over time without any noticeable change. She also described a couple "episodes" recently where she was unable to recall her or her husband's name in the few seconds after awakening from sleep. She acknowledged that she was unclear if she was dreaming these difficulties or if they truly happened. She further noted a time where she became briefly confused while driving to her massage therapist, causing her to park her car and collect herself before being able to drive to the location independently. These episodes were said to be very distressing, in large part due to her father exhibiting similar symptoms prior to his  diagnosis of Alzheimer's disease.  Decreased long-term memory: Denied. Decreased attention/concentration: Endorsed. She reported having an "informal" diagnosis of ADHD with primary symptoms surrounding ease of distractibility and jumping between many projects prior to completing them. She reported that these symptoms have been present throughout her entire life but never resulted in a formal diagnosis. During childhood, she also reported frequently being told to pay attention while in academic settings.  Reduced processing speed: Endorsed. Symptoms were said to vary between feeling fine and feeling "as thick as a log."  Difficulties with executive functions: Endorsed. She reported trouble with complex planning and organization. She acknowledged longstanding difficulties with indecisiveness. Impulsivity was largely denied. She described one instance where she was going to pay the full amount on a rental property three months prior to it being due. No other potential impulsive acts were reported. Overt personality changes were denied.  Difficulties with emotion regulation: Denied. Difficulties with receptive language: Denied assuming she can hear the source of the sound adequately. Difficulties with word finding: Endorsed "sometimes." Decreased visuoperceptual ability: Denied.  Difficulties completing ADLs: Denied.  Additional Medical History: History of traumatic brain injury/concussion: Endorsed. She reported falling on the ice while in high school. She was unsure if she lost consciousness, but did describe very brief amnesia surrounding the fall itself. She also reported falling off of a horse in 2010, resulting in a small subarachnoid hemorrhage. Neuroimaging was unavailable to review surrounding this event and it was unclear where in the brain the [facility] occurred. She reported being hospitalized for a total of two days and denied significant persisting symptoms stemming from this injury.  History of  stroke: Denied. History of seizure activity: Denied. History of known exposure to toxins: Denied. Symptoms of chronic pain: Endorsed. She reported a history of fibromyalgia and significant,  diffuse chronic pain. Specifically, she reported ongoing neck and back pain, degenerative arthritis, and a history of bilateral knee replacement. She reported current OTC medication use at times, but stated that this often does not make a noticeable difference in her symptoms.  Experience of frequent headaches/migraines: Denied. Frequent instances of dizziness/vertigo: Denied.  Sensory changes: She wears glasses with positive effect. She expressed concerns about mild hearing loss and was scheduled to see an audiologist in the near future. She also reported a diminished sense of smell.  Balance/coordination difficulties: Denied. Other motor difficulties: Largely denied. She reported some tremulous behaviors in her upper extremities, likely exacerbated by symptoms of anxiety.   Sleep History: Estimated hours obtained each night: 7 hours.  Difficulties falling asleep: Denied. Difficulties staying asleep: Endorsed. Difficulties were said to occur sporadically (1-2 times per week) and are likely exacerbated by anxiety or other psychosocial stressors.  Feels rested and refreshed upon awakening: Variably so.   History of snoring: Endorsed "sometimes." History of waking up gasping for air: Denied. Witnessed breath cessation while asleep: Denied.  History of vivid dreaming: Endorsed. Excessive movement while asleep: Endorsed. Instances of acting out her dreams: Endorsed. She reported moving around while asleep "constantly." She also reported a history of yelling/screaming in her sleep when she was younger. However, these behaviors have resurfaced and increased in frequency lately for unknown reasons.    Psychiatric/Behavioral Health History: Depression: Endorsed. Acutely, she described her mood as "depressed" and  noted occasionally feeling an "overwhelming sense of despair." She described several likely contributors, including the ongoing COVID-19 pandemic, current state of the world, her turning 69 years old, and concerns surrounding her brain health and cognition. She reported crying more often than what is typical. She denied current mood-related medication interventions. She reported working with a therapist which good success in her 23s but denied more recent therapeutic involvement.  Anxiety: Endorsed. She reported a history of generalized anxiety symptoms; however, a good portion of these symptoms seem to focus on her cognition. Medical records suggest a history of panic attacks. She reported taking anxiety-related medications as needed.  Mania: Denied. Trauma History: Largely denied. She did describe the passing of her step-son due to an overdose in 2009 as traumatic.  Visual/auditory hallucinations: Denied. Delusional thoughts: Denied.  Tobacco: Denied. She quit in 1987. Alcohol: She reported consuming 1-2 drinks (generally wine) per night but expressed a desire to cut back. She denied a history of problematic alcohol abuse or dependence.  Recreational drugs: Denied. Caffeine: 1.5 cups of 1/2 decaffeinated coffee in the morning.   Family History: Problem Relation Age of Onset  . Diabetes Mother   . Heart disease Mother   . Alzheimer's disease Father        Symptom onset in late 36s  . Atrial fibrillation Sister   . Tremor Other        Significant maternal history of tremor; unknown regarding Parkinson's disease diagnoses   This information was confirmed by Ms. Kassel.  Academic/Vocational History: Highest level of educational attainment: 18 years. She earned her MBA, as well as an additional Master's degree in industrial labor relations. She described herself as a good (A/B) student in academic settings. No relative academic weaknesses were reported.  History of developmental delay:  Denied. History of grade repetition: Denied. Enrollment in special education courses: Denied. History of LD/ADHD: Denied. However, as described above, she reported longstanding concerns regarding her having at least traits of ADHD.   Employment: Retired. 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   Evaluation Results:   Behavioral Observations: Ms. Kassebaum was unaccompanied, arrived to her appointment on time, and was appropriately dressed and groomed. She appeared alert and oriented. Observed gait and station were within normal limits. Gross motor functioning appeared intact upon informal observation and no abnormal movements (e.g., tremors) were noted. Her affect was anxious and range given the subject being discussed during the clinical interview. She was tearful when discussing recent memory lapse episodes, especially due to her father exhibited similar episodes prior to his diagnosis of Alzheimer's disease. Spontaneous speech was fluent and word finding difficulties were not observed during the clinical interview. Thought processes were coherent, organized, and normal in content. Insight into her cognitive difficulties appeared somewhat poor as reported difficulties were more pronounced than what was revealed on objective testing. During testing, sustained attention was appropriate. Task engagement was adequate and she persisted when challenged. Overall, Ms. Smarr was cooperative with the clinical interview and subsequent testing procedures.   Adequacy of Effort: The validity of neuropsychological testing is limited by the extent to which the individual being tested may be assumed to have exerted adequate effort during testing. Ms. Roop expressed her intention to perform to the best of her abilities and exhibited adequate task engagement and persistence. Scores  across stand-alone and embedded performance validity measures were within expectation. As such, the results of the current evaluation are believed to be a valid representation of Ms. Woodle's current cognitive functioning.  Test Results: Ms. Rease was fully oriented at the time of the current evaluation.  Intellectual abilities based upon educational and vocational attainment were estimated to be in the average to above average range. Premorbid abilities were estimated to be within the well above average range based upon a single-word reading test.   Processing speed was above average to exceptionally high. Basic attention was average. More complex attention (e.g., working memory) was well above average. Executive functioning was above average to well above average.  Assessed receptive language abilities were average. Likewise, Ms. Orsborn did not exhibit any difficulties comprehending task instructions and answered all questions asked of her appropriately. Word knowledge and sentence repetition screening instruments were within normal limits. Assessed expressive language (e.g., verbal fluency and confrontation naming) was average.     Assessed visuospatial/visuoconstructional abilities were above average to well above average.    Learning (i.e., encoding) of novel verbal and visual information was average to above average. Spontaneous delayed recall (i.e., retrieval) of previously learned information was average to well above average. Retention rates were 77% across a story learning task, 78% across a list learning task, and 92% across a shape learning task. Performance across recognition tasks was strong, suggesting evidence for information consolidation.   Results of emotional screening instruments suggested that recent symptoms of generalized anxiety were in the mild range, while symptoms of depression were also within the mild range. A screening instrument assessing recent sleep quality  suggested the presence of mild sleep dysfunction. Responses across a childhood assessment of ADHD scored in the "likely to have ADHD" range across symptoms of inattention and "highly likely to have ADHD" range across symptoms of hyperactivity/impulsivity.   Tables of Scores:   Note: This summary of test scores accompanies the interpretive report and should not be considered in isolation without reference to the appropriate sections in the text. Descriptors are based on appropriate normative data and may be  adjusted based on clinical judgment. The terms "impaired" and "within normal limits (WNL)" are used when a more specific level of functioning cannot be determined.       Effort Testing:   DESCRIPTOR       ACS Word Choice: --- --- Within Expectation  CVLT-III Forced Choice Recognition: --- --- Within Expectation  BVMT-R Retention Percentage: --- --- Within Expectation       Orientation:      Raw Score Percentile   NAB Orientation, Form 1 29/29 --- ---       Intellectual Functioning:           Standard Score Percentile   Test of Premorbid Functioning: [id number] Well Above Average       Memory:          Wechsler Memory Scale (WMS-IV):                       Raw Score (Scaled Score) Percentile     Logical Memory I 37/53 (12) 75 Above Average    Logical Memory II 20/39 (11) 63 Average    Logical Memory Recognition 20/23 51-75 Average       [place] Verbal Learning Test (CVLT-III) Brief Form: Raw Score (Scaled/Standard Score) Percentile     Total Trials 1-4 30/36 (112) 79 Above Average    Short-Delay Free Recall 8/9 (12) 75 Above Average    Long-Delay Free Recall 7/9 (11) 63 Average    Long-Delay Cued Recall 7/9 (10) 50 Average      Recognition Hits 9/9 (12) 75 Above Average      False Positive Errors 0 (12) 75 Above Average       Brief Visuospatial Memory Test (BVMT-R), Form 1: Raw Score (T Score) Percentile     Total Trials 1-3 24/36 (55) 69 Average    Delayed Recall 11/12 (63)  91 Well Above Average    Recognition Discrimination Index 6 >16 Within Normal Limits      Recognition Hits 6/6 >16 Within Normal Limits      False Positive Errors 0 >16 Within Normal Limits        Attention/Executive Function:          Trail Making Test (TMT): Raw Score (T Score) Percentile     Part A 17 secs.,  0 errors (71) 98 Exceptionally High    Part B 42 secs.,  0 errors (67) 96 Well Above Average         Scaled Score Percentile   WAIS-IV Coding: 13 84 Above Average       NAB Attention Module, Form 1: T Score Percentile     Digits Forward 54 66 Average    Digits Backwards 67 96 Well Above Average       D-KEFS Color-Word Interference Test: Raw Score (Scaled Score) Percentile     Color Naming 23 secs. (14) 91 Above Average    Word Reading 16 secs. (14) 91 Above Average    Inhibition 48 secs. (14) 91 Above Average      Total Errors 0 errors (12) 75 Above Average    Inhibition/Switching 60 secs. (12) 75 Above Average      Total Errors 0 errors (13) 84 Above Average       D-KEFS 20 Questions Test: Scaled Score Percentile     Total Weighted Achievement Score 14 91 Above Average    Initial Abstraction Score 14 91 Above Average       [employer]  Test [facility]): Raw Score Percentile     Categories (trials) 5 (64) >16 Within Normal Limits    Total Errors 9 86 Above Average    Perseverative Errors 6 79 Above Average    Non-Perseverative Errors 3 97 Well Above Average    Failure to Maintain Set 0 --- ---       Language:           Raw Score Percentile   PPVT Screening Instrument: 16/16 --- Within Expectation  Sentence Repetition: 18/22 97 Well Above Average       Verbal Fluency Test: Raw Score (T Score) Percentile     Phonemic Fluency (FAS) 49 (51) 54 Average    Animal Fluency 19 (46) 34 Average        NAB Language Module, Form 1: T Score Percentile     Auditory Comprehension 56 73 Average    Naming 31/31 (56) 73 Average       Visuospatial/Visuoconstruction:       Raw Score Percentile   Clock Drawing: 10/10 --- Within Normal Limits       NAB Spatial Module, Form 1: T Score Percentile     Figure Drawing Copy 65 93 Well Above Average        Scaled Score Percentile   WAIS-IV Block Design: 13 84 Above Average       Mood and Personality:      Raw Score Percentile   Geriatric Depression Scale: 12 --- Mild  Geriatric Anxiety Scale: 21 --- Mild    Somatic 9 --- Mild    Cognitive 7 --- Moderate    Affective 5 --- Mild       Additional Questionnaires:          Adult Self-Report Scale (Childhood): Raw Score Percentile     Inattention 20 --- Likely to have ADHD    Hyperactive/Impulsive 30 --- Highly likely to have ADHD       PROMIS Sleep Disturbance Questionnaire: 26 --- Mild   Informed Consent and Coding/Compliance:   Ms. Bost was provided with a verbal description of the nature and purpose of the present neuropsychological evaluation. Also reviewed were the foreseeable risks and/or discomforts and benefits of the procedure, limits of confidentiality, and mandatory reporting requirements of this provider. The patient was given the opportunity to ask questions and receive answers about the evaluation. Oral consent to participate was provided by the patient.   This evaluation was conducted by Christia Reading, Ph.D., licensed clinical neuropsychologist. Ms. Lerer completed a comprehensive clinical interview with Dr. Melvyn Novas, billed as one unit [phone], and 135 minutes of cognitive testing and scoring, billed as one unit [phone] and four additional units [id number]. Psychometrist Milana Kidney, B.S., assisted Dr. Melvyn Novas with test administration and scoring procedures. As a separate and discrete service, Dr. Melvyn Novas spent a total of 180 minutes in interpretation and report writing billed as one unit [phone] and two units [id number].

## 2020-03-05 NOTE — Progress Notes (Signed)
   Psychometrician Note   Cognitive testing was administered to Melissa Rodgers by Melissa Rodgers, B.S. (psychometrist) under the supervision of Dr. Christia Rodgers, Ph.D., licensed psychologist. Ms. Colver did not appear overtly distressed by the testing session per behavioral observation or responses across self-report questionnaires. Dr. Christia Rodgers, Ph.D. checked in with Melissa Rodgers as needed to manage any distress related to testing procedures (if applicable). Rest breaks were offered.    The battery of tests administered was selected by Dr. Christia Rodgers, Ph.D. with consideration to Melissa Rodgers's current level of functioning, the nature of her symptoms, emotional and behavioral responses during interview, level of literacy, observed level of motivation/effort, and the nature of the referral question. This battery was communicated to the psychometrist. Communication between Dr. Christia Rodgers, Ph.D. and the psychometrist was ongoing throughout the evaluation and Dr. Christia Rodgers, Ph.D. was immediately accessible at all times. Dr. Christia Rodgers, Ph.D. provided supervision to the psychometrist on the date of this service to the extent necessary to assure the quality of all services provided.    Melissa Rodgers will return within approximately 1-2 weeks for an interactive feedback session with Dr. Melvyn Rodgers at which time her test performances, clinical impressions, and treatment recommendations will be reviewed in detail. Melissa Rodgers understands she can contact our office should she require our assistance before this time.  A total of 135 minutes of billable time were spent face-to-face with Melissa Rodgers by the psychometrist. This includes both test administration and scoring time. Billing for these services is reflected in the clinical report generated by Dr. Christia Rodgers, Ph.D..  This note reflects time spent with the psychometrician and does not include test scores or any clinical  interpretations made by Dr. Melvyn Rodgers. The full report will follow in a separate note.

## 2020-03-05 NOTE — Progress Notes (Signed)
Cardiology Office Note:    Date:  03/06/2020   ID:  Melissa Rodgers, DOB 1950/12/09, MRN [id number]  PCP:  [name], DO  Cardiologist:  Shirlee More, MD    Referring MD: [name], DO    ASSESSMENT:    1. Atrial fibrillation, unspecified type ([place])   2. Essential hypertension   3. Chronic anticoagulation   4. High risk medication use    PLAN:    In order of problems listed above:  1. Stable in sinus rhythm paroxysmal atrial fibrillation continue flecainide low-dose low drug level no indication of toxicity and clinically I do not think she has a major concern for drug interaction with normal she will continue her anticoagulant.  BP is at target continue current medications and again no evidence of flecainide toxicity   Next appointment: 6 months   Medication Adjustments/Labs and Tests Ordered: Current medicines are reviewed at length with the patient today.  Concerns regarding medicines are outlined above.  Orders Placed This Encounter  Procedures  . EKG 12-Lead   No orders of the defined types were placed in this encounter.   Chief Complaint  Patient presents with  . Follow-up    6 Months    History of Present Illness:    Melissa Rodgers is a 69 y.o. female with a hx of hypertension, PVC, PAC, PAF on flecainide and anticoagulation  last seen 09/14/2019 and was maintaining sinus rhythm.. Compliance with diet, lifestyle and medications: Yes  I reviewed her flecainide level with the patient.  This is difficult visit as she has worsening depression anxiety and desperately wants to restart Prozac and after checking Medscape drug interactions is afraid to take either flecainide or medications for anxiety and depression.  I told her drug level is low her rhythm is stable her QT interval is normal and I do not think there is a major interaction and strongly encouraged her to take her medications.  She is reassured she is not having palpitation bleeding from  around coagulant chest pain or syncope.   Ref Range & Units1 mo ago Flecainide range 0.20 - 1.00 ug/mL   Result 0.29   Past Medical History:  Diagnosis Date  . Atrial fibrillation 06/18/2019  . Cherry angioma 04/17/2016  . Chronic bilateral low back pain without sciatica 10/07/2016  . 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  . Diverticulitis of sigmoid colon 08/01/2019  . Essential hypertension 09/09/2018  . Fibromyalgia   . Gastroesophageal reflux disease without esophagitis 11/06/2015  . Generalized anxiety disorder with panic attacks 08/01/2019  . History of palpitations 09/09/2018  . Hyperlipidemia 09/09/2018  . Major depressive disorder 09/09/2018  . Osteoarthritis   . S/P laparoscopic fundoplication 99991111  . Sebaceous hyperplasia 04/17/2016  . Subarachnoid hemorrhage following injury 2010   Fall from horse; brief hospitalization    Past Surgical History:  Procedure Laterality Date  . HERNIA REPAIR    . MEDIAL PARTIAL KNEE REPLACEMENT Right 12/14/2014  . NISSEN FUNDOPLICATION  [id number]  . OOPHORECTOMY    . PARTIAL KNEE ARTHROPLASTY Left 04/05/2014    Current Medications: Current Meds  Medication Sig  . amoxicillin (AMOXIL) 500 MG capsule TAKE 4 CAPSULES 1 HOURS BEFORE DENTAL APPOINTMENTS.  Marland Kitchen apixaban (ELIQUIS) 5 MG TABS tablet Take 1 tablet (5 mg total) by mouth 2 (two) times daily.  . Cetirizine HCl 10 MG CAPS Take 5 mg by mouth daily.   . Cholecalciferol (VITAMIN D-3) 1000 units  CAPS Take 1 capsule by mouth daily.   . clonazePAM (KLONOPIN) 0.5 MG tablet Take 0.5-1 tablets (0.25-0.5 mg total) by mouth 2 (two) times daily as needed for anxiety.  . Cyanocobalamin (B-12 PO) Take by mouth every other day.  . diclofenac Sodium (VOLTAREN) 1 % GEL Place onto the skin.  . flecainide (TAMBOCOR) 50 MG tablet Take 1 tablet (50 mg total) by mouth 2 (two) times daily.  Marland Kitchen losartan (COZAAR) 50  MG tablet Take 25 mg by mouth daily.  Marland Kitchen LYSINE PO Take 1 tablet by mouth daily as needed.   . metoprolol succinate (TOPROL-XL) 25 MG 24 hr tablet Take 1 tablet (25 mg total) by mouth daily. NEED TO CALL OFFICE TO SCHEDULE APPOINTMENT FOR FURTHER REFILLS     Allergies:   Rosuvastatin and Sulfa antibiotics   Social History   Socioeconomic History  . Marital status: Married    Spouse name: Not on file  . Number of children: 0  . Years of education: 29  . Highest education level: Master's degree (e.g., MA, MS, MEng, MEd, MSW, MBA)  Occupational History  . Occupation: Retired  Tobacco Use  . Smoking status: Former Smoker    Types: Cigarettes    Quit date: 08/18/1983    Years since quitting: 36.5  . Smokeless tobacco: Never Used  Substance and Sexual Activity  . Alcohol use: Yes    Alcohol/week: 7.0 - 14.0 standard drinks    Types: 7 - 14 Glasses of wine per week    Comment: 1-2 drinks per night, usually wine  . Drug use: Never  . Sexual activity: Yes    Partners: Male    Birth control/protection: None  Other Topics Concern  . Not on file  Social History Narrative  . Not on file   Social Determinants of Health   Financial Resource Strain:   . Difficulty of Paying Living Expenses:   Food Insecurity:   . Worried About Charity fundraiser in the Last Year:   . Arboriculturist in the Last Year:   Transportation Needs:   . Film/video editor (Medical):   Marland Kitchen Lack of Transportation (Non-Medical):   Physical Activity:   . Days of Exercise per Week:   . Minutes of Exercise per Session:   Stress:   . Feeling of Stress :   Social Connections:   . Frequency of Communication with Friends and Family:   . Frequency of Social Gatherings with Friends and Family:   . Attends Religious Services:   . Active Member of Clubs or Organizations:   . Attends Archivist Meetings:   Marland Kitchen Marital Status:      Family History: The patient's family history includes Alzheimer's  disease in her father; Atrial fibrillation in her sister; Diabetes in her mother; Heart disease in her mother; Tremor in an other family member. ROS:   Please see the history of present illness.    All other systems reviewed and are negative.  EKGs/Labs/Other Studies Reviewed:    The following studies were reviewed today:  EKG:  EKG ordered today and personally reviewed.  The ekg ordered today demonstrates sinus rhythm and is normal  Recent Labs: 02/22/2020: ALT 22; BUN 14; Creat 0.65; Hemoglobin 14.2; Platelets 280; Potassium 3.9; Sodium 141  Recent Lipid Panel    Component Value Date/Time   CHOL 220 (H) 02/22/2020 0920   TRIG 229 (H) 02/22/2020 0920   HDL 48 (L) 02/22/2020 0920   CHOLHDL 4.6 02/22/2020  0920   LDLCALC 135 (H) 02/22/2020 0920    Physical Exam:    VS:  BP 106/70   Pulse 72   Temp 98.4 F (36.9 C)   Ht 5\' 3"  (1.6 m)   Wt 180 lb (81.6 kg)   SpO2 97%   BMI 31.89 kg/m     Wt Readings from Last 3 Encounters:  03/06/20 180 lb (81.6 kg)  02/28/20 177 lb 8 oz (80.5 kg)  02/15/20 178 lb (80.7 kg)     GEN: Anxious unsettled well nourished, well developed in no acute distress HEENT: Normal NECK: No JVD; No carotid bruits LYMPHATICS: No lymphadenopathy CARDIAC: RRR, no murmurs, rubs, gallops RESPIRATORY:  Clear to auscultation without rales, wheezing or rhonchi  ABDOMEN: Soft, non-tender, non-distended MUSCULOSKELETAL:  No edema; No deformity  SKIN: Warm and dry NEUROLOGIC:  Alert and oriented x 3 PSYCHIATRIC:  Normal affect    Signed, Shirlee More, MD  03/06/2020 12:06 PM    [place]

## 2020-03-06 ENCOUNTER — Ambulatory Visit (INDEPENDENT_AMBULATORY_CARE_PROVIDER_SITE_OTHER): Payer: Medicare Other | Admitting: Cardiology

## 2020-03-06 ENCOUNTER — Encounter: Payer: Self-pay | Admitting: Cardiology

## 2020-03-06 VITALS — BP 106/70 | HR 72 | Temp 98.4°F | Ht 63.0 in | Wt 180.0 lb

## 2020-03-06 DIAGNOSIS — I10 Essential (primary) hypertension: Secondary | ICD-10-CM

## 2020-03-06 DIAGNOSIS — Z7901 Long term (current) use of anticoagulants: Secondary | ICD-10-CM

## 2020-03-06 DIAGNOSIS — I4891 Unspecified atrial fibrillation: Secondary | ICD-10-CM

## 2020-03-06 DIAGNOSIS — Z79899 Other long term (current) drug therapy: Secondary | ICD-10-CM | POA: Diagnosis not present

## 2020-03-06 NOTE — Patient Instructions (Signed)

## 2020-03-07 ENCOUNTER — Encounter: Payer: Self-pay | Admitting: Osteopathic Medicine

## 2020-03-08 NOTE — Telephone Encounter (Signed)
Routing to provider  

## 2020-03-09 MED ORDER — FLUOXETINE HCL 10 MG PO TABS: 5.0000 mg | ORAL_TABLET | Freq: Every day | ORAL | 3 refills | Status: DC

## 2020-03-12 ENCOUNTER — Ambulatory Visit (INDEPENDENT_AMBULATORY_CARE_PROVIDER_SITE_OTHER): Payer: Medicare Other | Admitting: Psychology

## 2020-03-12 ENCOUNTER — Encounter: Payer: Self-pay | Admitting: Psychology

## 2020-03-12 ENCOUNTER — Other Ambulatory Visit: Payer: Self-pay

## 2020-03-12 DIAGNOSIS — F330 Major depressive disorder, recurrent, mild: Secondary | ICD-10-CM

## 2020-03-12 DIAGNOSIS — F411 Generalized anxiety disorder: Secondary | ICD-10-CM

## 2020-03-12 DIAGNOSIS — F410 Panic disorder [episodic paroxysmal anxiety] without agoraphobia: Secondary | ICD-10-CM | POA: Diagnosis not present

## 2020-03-12 DIAGNOSIS — R41840 Attention and concentration deficit: Secondary | ICD-10-CM

## 2020-03-12 NOTE — Progress Notes (Signed)
   Neuropsychology Feedback Session Melissa Rodgers. Melissa Rodgers Department of Neurology  Reason for Referral:   Melissa Rodgers a 69 y.o. left-handed Caucasian female referred by Melissa Rodgers, D.O.,to characterize hercurrent cognitive functioning and assist with diagnostic clarity and treatment planning in the context of subjective cognitive decline and a family history of Alzheimer's diease.  Feedback:   Melissa Rodgers completed a comprehensive neuropsychological evaluation on 03/05/2020. Please refer to that encounter for the full report and recommendations. Briefly, results suggested neuropsychological functioning within normal limits with many areas of cognitive strength. A relative weakness was noted across a task assessing semantic fluency; however, her score was firmly in the average normative range and not cause for concern. Factors which can create and maintain day-to-day cognitive inefficiencies include ongoing psychiatric distress (she reported mild symptoms of both anxiety and depression occurring within the past 1-2 weeks), mild sleep dysfunction, and traits of attention-deficit hyperactivity disorder (ADHD). It is likely that a combination of these factors are responsible for day-to-day cognitive difficulties which Melissa Rodgers has been experiencing presently.  Melissa Rodgers was unaccompanied during the current telephone call. Content of the current session focused on the results of her neuropsychological evaluation. Melissa Rodgers was given the opportunity to ask questions and her questions were answered. She was encouraged to reach out should additional questions arise. Her report is available on MyChart for her to review.     17 minutes were spent conducting the current feedback session with Melissa Rodgers, billed as one unit [id number].

## 2020-03-16 ENCOUNTER — Ambulatory Visit (INDEPENDENT_AMBULATORY_CARE_PROVIDER_SITE_OTHER): Payer: Medicare Other | Admitting: Gastroenterology

## 2020-03-16 ENCOUNTER — Encounter: Payer: Self-pay | Admitting: Gastroenterology

## 2020-03-16 ENCOUNTER — Other Ambulatory Visit: Payer: Self-pay

## 2020-03-16 VITALS — BP 104/68 | HR 74 | Temp 97.5°F | Ht 63.0 in | Wt 180.5 lb

## 2020-03-16 DIAGNOSIS — K581 Irritable bowel syndrome with constipation: Secondary | ICD-10-CM | POA: Diagnosis not present

## 2020-03-16 DIAGNOSIS — K5732 Diverticulitis of large intestine without perforation or abscess without bleeding: Secondary | ICD-10-CM | POA: Diagnosis not present

## 2020-03-16 NOTE — Patient Instructions (Signed)
Return to office in 1 year  It was a pleasure to see you today!  [name], D.O.

## 2020-03-16 NOTE — Progress Notes (Signed)
Chief Complaint: Lower abdominal pain, change in bowel habits   Referring Provider:     [name], DO   HPI:     Kyoko Truelove is a 69 y.o. female with a history of paroxysmal atrial fibrillation (flecainide, Eliquis), hypertension, GERD, low back pain, referred to the Gastroenterology Clinic for evaluation of diverticular disease and transfer of GI care to [place] from [place] (outlined below).  Today, she states she feels well. No current GI issues, but wanted to establish care due to prior GI issues as outlined. Tolerating all PO intake.   Change in stools for the last 1.5 years after moving from [place] in 2019. Takes Benefiber, Miralax 1 cap/day, Gas-X, Cultrurel, and now much improved.   Index sxs in 2019 was: explosive diarrhea, liquid stools, urgency, which occurred every few days. Abdominal pain, which improved with BM. Long-standing hx of constipation x years prior to that. Stools were "irregular", randing from no BM x2-3 days, then liquid stools.   First episode of diverticulitis in 07/2019 as below.   With above GI regimen, now with 1-2 loose stools/day. Rare abdominal pain, usually if missed dose of Miralax.    Separately, has a history of GERD, with previous hiatal hernia repair and Nissen fundoplication in 0000000.  Reflux now well controlled, no longer needs any acid suppression therapy.  -02/22/2020: Normal CBC, CMP  Previous GI History: Was seen by GI at [facility] on 01/16/2020 for follow-up of diverticulitis, diagnosed in the ER in 07/2019 with CT scan notable for severe acute sigmoid diverticulitis, treated with antibiotics with clinical improvement. Colonoscopy in 09/2019 with moderate diverticulosis of the descending and sigmoid colon, lipoma, internal hemorrhoids, and inflammatory polyp of the sigmoid colon. Did endorse some abdominal bloating at that time, but pain was improved. Was treated with MiraLAX daily, then recurrence of LLQ  tenderness on follow-up in 11/2019. CT on 12/05/2019 was essentially normal, with resolution of sigmoid diverticulitis. Was treated with 14-day course of Augmentin.   At follow-up appointment on 01/16/2020 was still using MiraLAX daily with at least one BM/day. Still with abdominal cramping at times, especially if she stops MiraLAX. Some abdominal pain intermittently and specifically in the epigastrium. Denies nausea, vomiting, hematemesis, melena, fever, chills. Was using Gas-X three times/day for gas/bloating. Epigastric pain resolved with clonazepam as needed.  Was started on Benefiber daily, continued MiraLAX one cap/day with titration of each as needed. 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   Nieces x2 with Crohns. No GI malignancy.    Past Medical History:  Diagnosis Date  . Atrial fibrillation 06/18/2019  . Cherry angioma 04/17/2016  . Chronic bilateral low back pain without sciatica 10/07/2016  . 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  . Diverticulitis of sigmoid colon 08/01/2019  . Essential hypertension 09/09/2018  . Fibromyalgia   . Gastroesophageal reflux disease without esophagitis 11/06/2015  . Generalized anxiety disorder with panic attacks 08/01/2019  . History of palpitations 09/09/2018  . Hyperlipidemia 09/09/2018  . Major depressive disorder 09/09/2018  . Osteoarthritis   . S/P laparoscopic fundoplication 99991111  . Sebaceous hyperplasia 04/17/2016  . Subarachnoid hemorrhage following injury 2010   Fall from horse; brief hospitalization     Past Surgical History:  Procedure Laterality Date  . ESOPHAGOGASTRODUODENOSCOPY  possibly last one was 2017. In [place]  . HERNIA REPAIR    . MEDIAL PARTIAL KNEE REPLACEMENT Right 12/14/2014  . NISSEN FUNDOPLICATION  [id number]   . OOPHORECTOMY    . PARTIAL KNEE ARTHROPLASTY Left 04/05/2014   Family History  Problem Relation Age of Onset  . Diabetes Mother   . Heart disease Mother   . Alzheimer's disease Father        Symptom onset in late 13s  . Atrial fibrillation Sister   . Tremor Other        Significant maternal history of tremor; unknown regarding Parkinson's disease diagnoses  . Colon cancer Neg Hx   . Esophageal cancer Neg Hx    Social History   Tobacco Use  . Smoking status: Former Smoker    Types: Cigarettes    Quit date: 08/18/1983    Years since quitting: 36.6  . Smokeless tobacco: Never Used  Substance Use Topics  . Alcohol use: Yes    Alcohol/week: 7.0 - 14.0 standard drinks    Types: 7 - 14 Glasses of wine per week    Comment: 1-2 drinks per night, usually wine  . Drug use: Never   Current Outpatient Medications  Medication Sig Dispense Refill  . apixaban (ELIQUIS) 5 MG TABS tablet Take 1 tablet (5 mg total) by mouth 2 (two) times daily. 180 tablet 1  . Cetirizine HCl 10 MG CAPS Take 5 mg by mouth daily.     . Cholecalciferol (VITAMIN D-3) 1000 units CAPS Take 1 capsule by mouth daily.     . clonazePAM (KLONOPIN) 0.5 MG tablet Take 0.5-1 tablets (0.25-0.5 mg total) by mouth 2 (two) times daily as needed for anxiety. 30 tablet 0  . Cyanocobalamin (B-12 PO) Take by mouth every other day.    . diclofenac Sodium (VOLTAREN) 1 % GEL Place onto the skin as needed.     . flecainide (TAMBOCOR) 50 MG tablet Take 1 tablet (50 mg total) by mouth 2 (two) times daily. 180 tablet 1  . FLUoxetine (PROZAC) 10 MG tablet Take 0.5 tablets (5 mg total) by mouth daily.  3  . losartan (COZAAR) 50 MG tablet Take 25 mg by mouth daily.    Marland Kitchen LYSINE PO Take 1 tablet by mouth daily as needed.     . metoprolol succinate (TOPROL-XL) 25 MG 24 hr tablet Take 1 tablet (25 mg total) by mouth daily. NEED TO CALL OFFICE TO SCHEDULE APPOINTMENT FOR FURTHER REFILLS 90 tablet 0  . amoxicillin (AMOXIL) 500 MG capsule TAKE 4  CAPSULES 1 HOURS BEFORE DENTAL APPOINTMENTS.     No current facility-administered medications for this visit.   Allergies  Allergen Reactions  . Rosuvastatin Other (See Comments)    Other reaction(s): Musculoskeletal Aches Muscle spasms  . Sulfa Antibiotics Rash and Other (See Comments)     Review of Systems: All systems reviewed and negative except where noted in HPI.     Physical Exam:    Wt Readings from Last 3 Encounters:  03/16/20 180 lb 8 oz (81.9 kg)  03/06/20 180 lb (81.6 kg)  02/28/20 177 lb 8 oz (80.5 kg)    BP 104/68   Pulse 74   Temp (!) 97.5 F (36.4 C)   Ht 5\' 3"  (1.6 m)   Wt 180 lb 8 oz (81.9 kg)   BMI 31.97 kg/m  Constitutional:  Pleasant, in no acute distress. Psychiatric: Normal mood and affect. Behavior is normal. EENT: Pupils normal.  Conjunctivae are normal. No scleral  icterus. Neck supple. No cervical LAD. Cardiovascular: Normal rate, regular rhythm. No edema Pulmonary/chest: Effort normal and breath sounds normal. No wheezing, rales or rhonchi. Abdominal: Soft, nondistended, nontender. Bowel sounds active throughout. There are no masses palpable. No hepatomegaly. Neurological: Alert and oriented to person place and time. Skin: Skin is warm and dry. No rashes noted.   ASSESSMENT AND PLAN;   1) History of diverticulitis -Diverticulitis pain 07/2019, treated with antimicrobial therapy.  Recurrence of LLQ type pain, that was again treated with antibiotics as outlined above.  Now without symptoms -Discussed diverticulosis at length today, to include complications of diverticulitis and diverticular bleed, as well as less commonly SCAD.  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  Bowel habits now regulated on current regimen.  Discussed additional medication treatment strategies, but she is happy with  her current regimen -No changes at this time -Continue adequate fluid intake-continue high-fiber diet  3) History of GERD -4) History of hiatal hernia -Reflux and hiatal hernia both corrected with laparoscopic hiatal hernia repair and Nissen fundoplication in 0000000.  No longer with any reflux symptoms   RTC in 12 months or sooner as needed   Lavena Bullion, DO, FACG  03/16/2020, 8:34 AM   [name], DO

## 2020-04-01 ENCOUNTER — Other Ambulatory Visit: Payer: Self-pay | Admitting: Cardiology

## 2020-04-02 NOTE — Telephone Encounter (Signed)
LOV 03/06/2020 with Dr. Bettina Gavia

## 2020-04-03 DIAGNOSIS — H838X3 Other specified diseases of inner ear, bilateral: Secondary | ICD-10-CM | POA: Diagnosis not present

## 2020-04-03 DIAGNOSIS — L299 Pruritus, unspecified: Secondary | ICD-10-CM | POA: Diagnosis not present

## 2020-04-03 DIAGNOSIS — K115 Sialolithiasis: Secondary | ICD-10-CM | POA: Diagnosis not present

## 2020-04-03 DIAGNOSIS — H903 Sensorineural hearing loss, bilateral: Secondary | ICD-10-CM | POA: Diagnosis not present

## 2020-04-03 DIAGNOSIS — H9313 Tinnitus, bilateral: Secondary | ICD-10-CM | POA: Diagnosis not present

## 2020-04-09 ENCOUNTER — Telehealth: Payer: Self-pay

## 2020-04-09 NOTE — Telephone Encounter (Signed)
-----   Message from [name], DO sent at 02/28/2020  9:24 AM EDT ----- Colonoscopy in Care Everywhere

## 2020-04-10 ENCOUNTER — Encounter: Payer: Self-pay | Admitting: Osteopathic Medicine

## 2020-04-10 NOTE — Telephone Encounter (Signed)
Colonoscopy info updated in patient's chart.

## 2020-04-11 ENCOUNTER — Ambulatory Visit (INDEPENDENT_AMBULATORY_CARE_PROVIDER_SITE_OTHER): Payer: Medicare Other | Admitting: Psychologist

## 2020-04-11 DIAGNOSIS — F411 Generalized anxiety disorder: Secondary | ICD-10-CM

## 2020-04-25 ENCOUNTER — Ambulatory Visit (INDEPENDENT_AMBULATORY_CARE_PROVIDER_SITE_OTHER): Payer: Medicare Other | Admitting: Psychologist

## 2020-04-25 DIAGNOSIS — F411 Generalized anxiety disorder: Secondary | ICD-10-CM

## 2020-05-09 ENCOUNTER — Ambulatory Visit (INDEPENDENT_AMBULATORY_CARE_PROVIDER_SITE_OTHER): Payer: Medicare Other | Admitting: Psychologist

## 2020-05-09 DIAGNOSIS — F411 Generalized anxiety disorder: Secondary | ICD-10-CM | POA: Diagnosis not present

## 2020-05-21 DIAGNOSIS — Z87891 Personal history of nicotine dependence: Secondary | ICD-10-CM | POA: Diagnosis not present

## 2020-05-21 DIAGNOSIS — M797 Fibromyalgia: Secondary | ICD-10-CM | POA: Diagnosis not present

## 2020-05-21 DIAGNOSIS — Z7901 Long term (current) use of anticoagulants: Secondary | ICD-10-CM | POA: Diagnosis not present

## 2020-05-21 DIAGNOSIS — Z79899 Other long term (current) drug therapy: Secondary | ICD-10-CM | POA: Diagnosis not present

## 2020-05-21 DIAGNOSIS — M1990 Unspecified osteoarthritis, unspecified site: Secondary | ICD-10-CM | POA: Diagnosis not present

## 2020-05-21 DIAGNOSIS — Z888 Allergy status to other drugs, medicaments and biological substances status: Secondary | ICD-10-CM | POA: Diagnosis not present

## 2020-05-21 DIAGNOSIS — E785 Hyperlipidemia, unspecified: Secondary | ICD-10-CM | POA: Diagnosis not present

## 2020-05-21 DIAGNOSIS — Z791 Long term (current) use of non-steroidal anti-inflammatories (NSAID): Secondary | ICD-10-CM | POA: Diagnosis not present

## 2020-05-21 DIAGNOSIS — Z882 Allergy status to sulfonamides status: Secondary | ICD-10-CM | POA: Diagnosis not present

## 2020-05-21 DIAGNOSIS — K219 Gastro-esophageal reflux disease without esophagitis: Secondary | ICD-10-CM | POA: Diagnosis not present

## 2020-05-21 DIAGNOSIS — I4891 Unspecified atrial fibrillation: Secondary | ICD-10-CM | POA: Diagnosis not present

## 2020-05-21 DIAGNOSIS — I10 Essential (primary) hypertension: Secondary | ICD-10-CM | POA: Diagnosis not present

## 2020-05-22 ENCOUNTER — Telehealth: Payer: Self-pay | Admitting: Cardiology

## 2020-05-22 NOTE — Telephone Encounter (Signed)
    Pt said she went to [facility] last night due to Afib, she was advised to call her heart doctor. She would like to speak with Dr. Bettina Gavia to discuss

## 2020-05-22 NOTE — Telephone Encounter (Signed)
Spoke to the patient just now and she let me know that she went to the ED at [facility]. She said that they recommended that she follow up with her cardiologist. I let her know that the soonest thing Dr. Bettina Gavia had in [facility] was 06/06/20. She verbalizes understanding and states that appointment date would be fine. No other issues or concerns were noted at this time.   Encouraged patient to call back with any questions or concerns.

## 2020-05-27 ENCOUNTER — Other Ambulatory Visit: Payer: Self-pay | Admitting: Cardiology

## 2020-06-05 NOTE — Progress Notes (Signed)
Cardiology Office Note:    Date:  06/06/2020   ID:  Melissa Rodgers, DOB [birth year], MRN [id number]  PCP:  [name], DO  Cardiologist:  Shirlee More, MD    Referring MD: [name], DO    ASSESSMENT:    1. PAF (paroxysmal atrial fibrillation) ([place])   2. Chronic anticoagulation   3. High risk medication use   4. Essential hypertension    PLAN:    In order of problems listed above:  1. Continue flecainide and beta-blocker and anticoagulant, check level to be sure she has adequate blood concentration and develop a strategy of rate control with short acting Cardizem as opposed to going to emergency room. 2. BP at target continue current treatment   Next appointment: 3 months   Medication Adjustments/Labs and Tests Ordered: Current medicines are reviewed at length with the patient today.  Concerns regarding medicines are outlined above.  No orders of the defined types were placed in this encounter.  Meds ordered this encounter  Medications  . diltiazem (CARDIZEM) 30 MG tablet    Sig: Take 1 tablet (30 mg total) by mouth every 4 (four) hours as needed.    Dispense:  90 tablet    Refill:  3    Chief Complaint  Patient presents with  . Follow-up  . Atrial Fibrillation    History of Present Illness:    Melissa Rodgers is a 69 y.o. female with a hx of paroxysmal atrial fibrillation maintaining sinus rhythm on flecainide hypertension and chronic anticoagulation.  She was last seen 03/06/2020. Compliance with diet, lifestyle and medications: Yes   seen in the emergency department [facility] 05/21/2020 with rapid heart rate.  She will EKG showed atrial fibrillation with rapid ventricular response and cardioverted spontaneously in the emergency room to sinus rhythm.  Test include a high-sensitivity troponin which was normal, chest x-ray normal, CBC normal hemoglobin 15.2 CMP normal except for glucose of 120, creatinine 0.56 GFR 110 cc/min potassium 3.9 and  magnesium normal 2.1   Ref Range & Units 4 mo ago  Flecainide 0.20 - 1.00 ug/mL 0.29    Her 2 episodes were associated with a second glass of wine and she will be reducing her abstaining.  She has an Customer service manager I reviewed the strips and she was in atrial fibrillation a rate of 140 bpm.  As opposed to going to emergency room as we will come up with a strategy that she will rest take short acting Cardizem for rate control an extra tablet of flecainide if she has a recurrent episode she tolerates her anticoagulant without bleeding complication is not having chest pain shortness of breath or syncope. Past Medical History:  Diagnosis Date  . Atrial fibrillation 06/18/2019  . Cherry angioma 04/17/2016  . Chronic bilateral low back pain without sciatica 10/07/2016  . 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  . Diverticulitis of sigmoid colon 08/01/2019  . Essential hypertension 09/09/2018  . Fibromyalgia   . Gastroesophageal reflux disease without esophagitis 11/06/2015  . Generalized anxiety disorder with panic attacks 08/01/2019  . History of palpitations 09/09/2018  . Hyperlipidemia 09/09/2018  . Major depressive disorder 09/09/2018  . Osteoarthritis   . S/P laparoscopic fundoplication 9/74/1638  . Sebaceous hyperplasia 04/17/2016  . Subarachnoid hemorrhage following injury 2010   Fall from horse; brief hospitalization    Past Surgical History:  Procedure Laterality Date  . ESOPHAGOGASTRODUODENOSCOPY     possibly last one  was 2017. In [place]  . HERNIA REPAIR    . MEDIAL PARTIAL KNEE REPLACEMENT Right 12/14/2014  . NISSEN FUNDOPLICATION  25/42/7062  . OOPHORECTOMY    . PARTIAL KNEE ARTHROPLASTY Left 04/05/2014    Current Medications: Current Meds  Medication Sig  . amoxicillin (AMOXIL) 500 MG capsule TAKE 4 CAPSULES 1 HOURS BEFORE DENTAL APPOINTMENTS.  Marland Kitchen Cetirizine HCl 10 MG CAPS Take 5 mg by mouth  daily.   . Cholecalciferol (VITAMIN D-3) 1000 units CAPS Take 1 capsule by mouth daily.   . clonazePAM (KLONOPIN) 0.5 MG tablet Take 0.5-1 tablets (0.25-0.5 mg total) by mouth 2 (two) times daily as needed for anxiety.  . Cyanocobalamin (B-12 PO) Take by mouth daily.   . diclofenac Sodium (VOLTAREN) 1 % GEL Place onto the skin as needed.   Marland Kitchen ELIQUIS 5 MG TABS tablet TAKE 1 TABLET TWICE A DAY  . flecainide (TAMBOCOR) 50 MG tablet TAKE 1 TABLET(50 MG) BY MOUTH TWICE DAILY  . FLUoxetine (PROZAC) 10 MG tablet Take 10 mg by mouth daily.  Marland Kitchen losartan (COZAAR) 50 MG tablet Take 25 mg by mouth daily.  Marland Kitchen LYSINE PO Take 1 tablet by mouth daily as needed.   . metoprolol succinate (TOPROL-XL) 25 MG 24 hr tablet TAKE 1 TABLET(25 MG) BY MOUTH DAILY     Allergies:   Rosuvastatin and Sulfa antibiotics   Social History   Socioeconomic History  . Marital status: Married    Spouse name: Not on file  . Number of children: 0  . Years of education: 46  . Highest education level: Master's degree (e.g., MA, MS, MEng, MEd, MSW, MBA)  Occupational History  . Occupation: Retired  Tobacco Use  . Smoking status: Former Smoker    Types: Cigarettes    Quit date: 08/18/1983    Years since quitting: 36.8  . Smokeless tobacco: Never Used  Vaping Use  . Vaping Use: Never used  Substance and Sexual Activity  . Alcohol use: Yes    Alcohol/week: 7.0 - 14.0 standard drinks    Types: 7 - 14 Glasses of wine per week    Comment: 1-2 drinks per night, usually wine  . Drug use: Never  . Sexual activity: Yes    Partners: Male    Birth control/protection: None  Other Topics Concern  . Not on file  Social History Narrative  . Not on file   Social Determinants of Health   Financial Resource Strain:   . Difficulty of Paying Living Expenses:   Food Insecurity:   . Worried About Charity fundraiser in the Last Year:   . Arboriculturist in the Last Year:   Transportation Needs:   . Film/video editor (Medical):    Marland Kitchen Lack of Transportation (Non-Medical):   Physical Activity:   . Days of Exercise per Week:   . Minutes of Exercise per Session:   Stress:   . Feeling of Stress :   Social Connections:   . Frequency of Communication with Friends and Family:   . Frequency of Social Gatherings with Friends and Family:   . Attends Religious Services:   . Active Member of Clubs or Organizations:   . Attends Archivist Meetings:   Marland Kitchen Marital Status:      Family History: The patient's family history includes Alzheimer's disease in her father; Atrial fibrillation in her sister; Diabetes in her mother; Heart disease in her mother; Tremor in an other family member. There is no history  of Colon cancer or Esophageal cancer. ROS:   Please see the history of present illness.    All other systems reviewed and are negative.  EKGs/Labs/Other Studies Reviewed:    The following studies were reviewed today:    Recent Labs: 02/22/2020: ALT 22; BUN 14; Creat 0.65; Hemoglobin 14.2; Platelets 280; Potassium 3.9; Sodium 141  Recent Lipid Panel    Component Value Date/Time   CHOL 220 (H) 02/22/2020 0920   TRIG 229 (H) 02/22/2020 0920   HDL 48 (L) 02/22/2020 0920   CHOLHDL 4.6 02/22/2020 0920   LDLCALC 135 (H) 02/22/2020 0920    Physical Exam:    VS:  BP 108/60   Pulse (!) 59   Ht 5\' 3"  (1.6 m)   Wt 179 lb (81.2 kg)   SpO2 97%   BMI 31.71 kg/m     Wt Readings from Last 3 Encounters:  06/06/20 179 lb (81.2 kg)  03/16/20 180 lb 8 oz (81.9 kg)  03/06/20 180 lb (81.6 kg)     GEN:  Well nourished, well developed in no acute distress HEENT: Normal NECK: No JVD; No carotid bruits LYMPHATICS: No lymphadenopathy CARDIAC: RRR, no murmurs, rubs, gallops RESPIRATORY:  Clear to auscultation without rales, wheezing or rhonchi  ABDOMEN: Soft, non-tender, non-distended MUSCULOSKELETAL:  No edema; No deformity  SKIN: Warm and dry NEUROLOGIC:  Alert and oriented x 3 PSYCHIATRIC:  Normal affect     Signed, Shirlee More, MD  06/06/2020 10:29 AM    [facility]

## 2020-06-06 ENCOUNTER — Other Ambulatory Visit: Payer: Self-pay

## 2020-06-06 ENCOUNTER — Ambulatory Visit (INDEPENDENT_AMBULATORY_CARE_PROVIDER_SITE_OTHER): Payer: Medicare Other | Admitting: Cardiology

## 2020-06-06 ENCOUNTER — Encounter: Payer: Self-pay | Admitting: Cardiology

## 2020-06-06 VITALS — BP 108/60 | HR 59 | Ht 63.0 in | Wt 179.0 lb

## 2020-06-06 DIAGNOSIS — I480 Paroxysmal atrial fibrillation: Secondary | ICD-10-CM | POA: Diagnosis not present

## 2020-06-06 DIAGNOSIS — Z79899 Other long term (current) drug therapy: Secondary | ICD-10-CM

## 2020-06-06 DIAGNOSIS — Z7901 Long term (current) use of anticoagulants: Secondary | ICD-10-CM

## 2020-06-06 DIAGNOSIS — I10 Essential (primary) hypertension: Secondary | ICD-10-CM

## 2020-06-06 MED ORDER — DILTIAZEM HCL 30 MG PO TABS
30.0000 mg | ORAL_TABLET | ORAL | 3 refills | Status: DC | PRN
Start: 2020-06-06 — End: 2022-07-11

## 2020-06-06 NOTE — Patient Instructions (Signed)
Medication Instructions:  Your physician has recommended you make the following change in your medication:  START: Cardizem 30 mg take one tablet by mouth every 4 hours as needed for atrial fibrillation. Only take this if your heart rate is above 110 beats per minute.   Please take an extra tablet of your flecainide daily if you experience recurrent Atrial fibrillation.  *If you need a refill on your cardiac medications before your next appointment, please call your pharmacy*   Lab Work: Your physician recommends that you return for lab work in: TODAY Flecainide level If you have labs (blood work) drawn today and your tests are completely normal, you will receive your results only by: Marland Kitchen MyChart Message (if you have MyChart) OR . A paper copy in the mail If you have any lab test that is abnormal or we need to change your treatment, we will call you to review the results.   Testing/Procedures: None   Follow-Up: At [facility], you and your health needs are our priority.  As part of our continuing mission to provide you with exceptional heart care, we have created designated Provider Care Teams.  These Care Teams include your primary Cardiologist (physician) and Advanced Practice Providers (APPs -  Physician Assistants and Nurse Practitioners) who all work together to provide you with the care you need, when you need it.  We recommend signing up for the patient portal called "MyChart".  Sign up information is provided on this After Visit Summary.  MyChart is used to connect with patients for Virtual Visits (Telemedicine).  Patients are able to view lab/test results, encounter notes, upcoming appointments, etc.  Non-urgent messages can be sent to your provider as well.   To learn more about what you can do with MyChart, go to [email or web address].    Your next appointment:   3 month(s)  The format for your next appointment:   In Person  Provider:   Shirlee More, MD   Other  Instructions

## 2020-06-08 ENCOUNTER — Telehealth: Payer: Self-pay

## 2020-06-08 LAB — FLECAINIDE LEVEL: Flecainide: 0.28 ug/mL (ref 0.20–1.00)

## 2020-06-08 NOTE — Telephone Encounter (Signed)
-----   Message from Richardo Priest, MD sent at 06/08/2020  7:42 AM EDT ----- Normal or stable result  No changes

## 2020-06-08 NOTE — Telephone Encounter (Signed)
Spoke with patient regarding results and recommendation.  Patient verbalizes understanding and is agreeable to plan of care. Advised patient to call back with any issues or concerns.  

## 2020-07-17 ENCOUNTER — Encounter: Payer: Self-pay | Admitting: Gastroenterology

## 2020-07-17 ENCOUNTER — Ambulatory Visit (INDEPENDENT_AMBULATORY_CARE_PROVIDER_SITE_OTHER): Payer: Medicare Other | Admitting: Gastroenterology

## 2020-07-17 VITALS — BP 110/80 | HR 63 | Ht 63.0 in | Wt 182.5 lb

## 2020-07-17 DIAGNOSIS — K581 Irritable bowel syndrome with constipation: Secondary | ICD-10-CM | POA: Diagnosis not present

## 2020-07-17 DIAGNOSIS — K5730 Diverticulosis of large intestine without perforation or abscess without bleeding: Secondary | ICD-10-CM | POA: Diagnosis not present

## 2020-07-17 DIAGNOSIS — R109 Unspecified abdominal pain: Secondary | ICD-10-CM | POA: Diagnosis not present

## 2020-07-17 NOTE — Progress Notes (Signed)
P  Chief Complaint:    ER follow-up  GI History: Mirriam Rodgers is a 69 y.o. female with a history of paroxysmal atrial fibrillation (flecainide, Eliquis), hypertension, GERD, low back pain, who follows in the GI Clinic for diverticular disease, IBS, and GERD.  Previously followed with [facility], establish care with me in 02/2020.  1) Diverticulosis: Patient with episode of diverticulitis in 07/2019 -CT 07/2019: Severe, acute sigmoid diverticulitis; treated with antibiotics -Colonoscopy 09/2019 ([facility]): moderate diverticulosis of the descending and sigmoid colon, lipoma, internal hemorrhoids, and inflammatory polyp of the sigmoid colon -CT 12/05/2019: Normal, resolution of sigmoid diverticulitis.  Treated with 14-day course of Augmentin for LLQ tenderness -CT 01/17/2020: Mild diverticulosis without diverticulitis, stable uterine fibroid  2) IBS-C:  Intermittent abdominal cramping and constipation, with occasional overflow diarrhea.  Improved with MiraLAX and Benefiber.  Uses Gas-X for gas/bloating.  History of epigastric pain resolved with clonazepam as needed.  3) GERD: History of GERD, with previous hiatal hernia repair and Nissen fundoplication in 6213.  Reflux now well controlled, no longer needs any acid suppression therapy.  HPI:     Patient is a 69 y.o. female presenting to the Gastroenterology Clinic for follow-up.  Initially seen by me on 03/16/2020 to establish care.  GI history as outlined above.  Since last appointment, seen in the [facility] ER on 05/13/2020 w/ c/o tachycardia and intermittent chest pain- diagnosed with AFib. Normal CBC and CMP.  No abdominal imaging.  Recommended follow-up in the GI clinic.  Has since followed up in the Cardiology clinic.   Today, she states no change in baseline GI sxs. Still taking Miralax daily, which controls constipation well. If any missed doses, will have abdominal pain/constipation. Has increased dietary fiber and able to back down on  supplemental fiber to every few days instead of daily. Otherwise, she is largely without complaints today.     Review of systems:     No chest pain, no SOB, no fevers, no urinary sx   Past Medical History:  Diagnosis Date  . Atrial fibrillation 06/18/2019  . Cherry angioma 04/17/2016  . Chronic bilateral low back pain without sciatica 10/07/2016  . 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  . Diverticulitis of sigmoid colon 08/01/2019  . Essential hypertension 09/09/2018  . Fibromyalgia   . Gastroesophageal reflux disease without esophagitis 11/06/2015  . Generalized anxiety disorder with panic attacks 08/01/2019  . History of palpitations 09/09/2018  . Hyperlipidemia 09/09/2018  . Major depressive disorder 09/09/2018  . Osteoarthritis   . S/P laparoscopic fundoplication 0/86/5784  . Sebaceous hyperplasia 04/17/2016  . Subarachnoid hemorrhage following injury 2010   Fall from horse; brief hospitalization    Patient's surgical history, family medical history, social history, medications and allergies were all reviewed in Epic    Current Outpatient Medications  Medication Sig Dispense Refill  . Cetirizine HCl 10 MG CAPS Take 5 mg by mouth daily.     . Cholecalciferol (VITAMIN D-3) 1000 units CAPS Take 1 capsule by mouth daily.     . clonazePAM (KLONOPIN) 0.5 MG tablet Take 0.5-1 tablets (0.25-0.5 mg total) by mouth 2 (two) times daily as needed for anxiety. (Patient taking differently: Take 0.25-0.5 mg by mouth as needed for anxiety. ) 30 tablet 0  . Cyanocobalamin (B-12 PO) Take by mouth daily.     . diclofenac Sodium (VOLTAREN) 1 % GEL Place onto the skin as needed.     Marland Kitchen  ELIQUIS 5 MG TABS tablet TAKE 1 TABLET TWICE A DAY 180 tablet 1  . flecainide (TAMBOCOR) 50 MG tablet TAKE 1 TABLET(50 MG) BY MOUTH TWICE DAILY 180 tablet 2  . FLUoxetine (PROZAC) 10 MG tablet Take 10 mg by mouth daily.    Marland Kitchen  losartan (COZAAR) 50 MG tablet Take 25 mg by mouth daily.    Marland Kitchen LYSINE PO Take 1 tablet by mouth daily as needed.     . metoprolol succinate (TOPROL-XL) 25 MG 24 hr tablet TAKE 1 TABLET(25 MG) BY MOUTH DAILY 90 tablet 2  . amoxicillin (AMOXIL) 500 MG capsule TAKE 4 CAPSULES 1 HOURS BEFORE DENTAL APPOINTMENTS. (Patient not taking: Reported on 07/17/2020)    . diltiazem (CARDIZEM) 30 MG tablet Take 1 tablet (30 mg total) by mouth every 4 (four) hours as needed. (Patient not taking: Reported on 07/17/2020) 90 tablet 3   No current facility-administered medications for this visit.    Physical Exam:     BP 110/80   Pulse 63   Ht 5\' 3"  (1.6 m)   Wt 182 lb 8 oz (82.8 kg)   BMI 32.33 kg/m   GENERAL:  Pleasant female in NAD PSYCH: : Cooperative, normal affect NEURO: Alert and oriented x 3, no focal neurologic deficits   IMPRESSION and PLAN:    1) IBS-C: -Overall doing well on current regimen.  No changes at this time -Resume MiraLAX and high-fiber diet -Can introduce fiber supplement as needed -Can look to titrate MiraLAX to lowest effective dose in future -Did discuss changing from MiraLAX to another regimen, such as Amitiza or Linzess, and she prefers to continue with MiraLAX given good clinical effect -Continue adequate hydration  2) Diverticulosis 3) History of diverticulitis -No recurrence since index episode of diverticulitis in 07/2019 -High-fiber diet as above  4) History of GERD 5) History of hiatal hernia -Reflux and hiatal hernia both corrected with laparoscopic hiatal hernia repair and Nissen fundoplication in 1761.  No longer with any reflux symptoms  RTC in 12 months or sooner as needed  I spent 25 minutes of time, including independent review of results as outlined above, communicating results with the patient directly, face-to-face time with the patient, coordinating care, ordering studies and medications as appropriate, and documentation.            Lavena Bullion ,DO, FACG 07/17/2020, 10:23 AM

## 2020-07-17 NOTE — Patient Instructions (Signed)
If you are age 70 or older, your body mass index should be between 23-30. Your Body mass index is 32.33 kg/m. If this is out of the aforementioned range listed, please consider follow up with your Primary Care Provider.  If you are age 52 or younger, your body mass index should be between 19-25. Your Body mass index is 32.33 kg/m. If this is out of the aformentioned range listed, please consider follow up with your Primary Care Provider.   Follow up in one year.  It was a pleasure to see you today!  [name], D.O.

## 2020-07-22 ENCOUNTER — Encounter: Payer: Self-pay | Admitting: Osteopathic Medicine

## 2020-07-27 DIAGNOSIS — L218 Other seborrheic dermatitis: Secondary | ICD-10-CM | POA: Diagnosis not present

## 2020-07-27 DIAGNOSIS — L821 Other seborrheic keratosis: Secondary | ICD-10-CM | POA: Diagnosis not present

## 2020-07-27 DIAGNOSIS — L578 Other skin changes due to chronic exposure to nonionizing radiation: Secondary | ICD-10-CM | POA: Diagnosis not present

## 2020-07-27 DIAGNOSIS — L304 Erythema intertrigo: Secondary | ICD-10-CM | POA: Diagnosis not present

## 2020-07-27 DIAGNOSIS — D1801 Hemangioma of skin and subcutaneous tissue: Secondary | ICD-10-CM | POA: Diagnosis not present

## 2020-07-27 DIAGNOSIS — D225 Melanocytic nevi of trunk: Secondary | ICD-10-CM | POA: Diagnosis not present

## 2020-07-27 DIAGNOSIS — L814 Other melanin hyperpigmentation: Secondary | ICD-10-CM | POA: Diagnosis not present

## 2020-08-22 ENCOUNTER — Ambulatory Visit: Payer: Medicare Other | Admitting: Cardiology

## 2020-08-23 DIAGNOSIS — Z23 Encounter for immunization: Secondary | ICD-10-CM | POA: Diagnosis not present

## 2020-08-30 DIAGNOSIS — H524 Presbyopia: Secondary | ICD-10-CM | POA: Diagnosis not present

## 2020-08-30 DIAGNOSIS — H5203 Hypermetropia, bilateral: Secondary | ICD-10-CM | POA: Diagnosis not present

## 2020-08-30 DIAGNOSIS — H43813 Vitreous degeneration, bilateral: Secondary | ICD-10-CM | POA: Diagnosis not present

## 2020-08-30 DIAGNOSIS — H25813 Combined forms of age-related cataract, bilateral: Secondary | ICD-10-CM | POA: Diagnosis not present

## 2020-09-06 DIAGNOSIS — Z23 Encounter for immunization: Secondary | ICD-10-CM | POA: Diagnosis not present

## 2020-09-11 ENCOUNTER — Other Ambulatory Visit: Payer: Self-pay

## 2020-09-11 ENCOUNTER — Encounter: Payer: Self-pay | Admitting: Cardiology

## 2020-09-11 ENCOUNTER — Ambulatory Visit (INDEPENDENT_AMBULATORY_CARE_PROVIDER_SITE_OTHER): Payer: Medicare Other | Admitting: Cardiology

## 2020-09-11 VITALS — BP 136/84 | HR 58 | Ht 63.0 in | Wt 185.1 lb

## 2020-09-11 DIAGNOSIS — Z79899 Other long term (current) drug therapy: Secondary | ICD-10-CM

## 2020-09-11 DIAGNOSIS — I10 Essential (primary) hypertension: Secondary | ICD-10-CM | POA: Diagnosis not present

## 2020-09-11 DIAGNOSIS — Z7901 Long term (current) use of anticoagulants: Secondary | ICD-10-CM | POA: Diagnosis not present

## 2020-09-11 DIAGNOSIS — I480 Paroxysmal atrial fibrillation: Secondary | ICD-10-CM | POA: Diagnosis not present

## 2020-09-11 NOTE — Patient Instructions (Signed)

## 2020-09-11 NOTE — Progress Notes (Signed)
Cardiology Office Note:    Date:  09/11/2020   ID:  Melissa Rodgers, DOB 08-Jan-1951, MRN [id number]  PCP:  Melissa Reeve, DO  Cardiologist:  Melissa More, MD    Referring MD: Melissa Reeve, DO    ASSESSMENT:    1. PAF (paroxysmal atrial fibrillation) ([place])   2. Chronic anticoagulation   3. High risk medication use   4. Essential hypertension    PLAN:    In order of problems listed above:  1. She is doing well tolerates flecainide no evidence of toxicity no recurrent atrial fibrillation anticoagulated has a plan if she has recurrence to take an extra tablet of her antiarrhythmic drug and Cardizem for rate control and avoid ED visits. 2. Continue her anticoagulant 3. Continue flecainide no evidence of toxicity on EKG and I do not think we need to check a blood level today 4. BP is at target continue current treatment ARB   Next appointment: 6 months   Medication Adjustments/Labs and Tests Ordered: Current medicines are reviewed at length with the patient today.  Concerns regarding medicines are outlined above.  Orders Placed This Encounter  Procedures  . EKG 12-Lead   No orders of the defined types were placed in this encounter.   No chief complaint on file.   History of Present Illness:    Melissa Rodgers is a 69 y.o. female with a hx of paroxysmal atrial fibrillation maintaining sinus rhythm on flecainide and chronically anticoagulated.  Flecainide level at the last visit was therapeutic.  She was last seen 06/06/2020 and advised to purchase the iPhone adapter to screen her heart rhythm at home.  She is given a prescription for short acting Cardizem for rate control if she has recurrence of rapid atrial fibrillation. Compliance with diet, lifestyle and medications: Yes  She feels better about herself Rodgers cough and has had no recurrence of atrial fibrillation I reviewed 10 recordings from [facility] and they are all sinus rhythm.  No bleeding from her  anticoagulant overall pleased with the quality of her life.  She has a history of left shoulder left upper extremity pain is gone to physical therapy in the past and is very typical of rotator cuff and nerve impingement.  Is nonanginal in nature and I gave her the name of to orthopedic surgeon specialist and surgery on the shoulder Dr. Alma Rodgers and Dr. Charna Rodgers. Past Medical History:  Diagnosis Date  . Atrial fibrillation 06/18/2019  . BMI 30.0-30.9,adult 01/27/2013  . Candidosis of skin 04/17/2016   Formatting of this note might be different from the original. lower abdomen  . Cherry angioma 04/17/2016  . Chronic bilateral low back pain without sciatica 10/07/2016  . 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  Marland Kitchen Dermal nevus of left forearm 04/17/2016  . Diverticulitis of sigmoid colon 08/01/2019  . Dry eye syndrome of both eyes 09/09/2018  . Dry skin 04/17/2016  . Essential hypertension 09/09/2018  . Fibromyalgia   . Gastroesophageal reflux disease without esophagitis 11/06/2015  . Generalized anxiety disorder with panic attacks 08/01/2019  . History of palpitations 09/09/2018  . Hyperlipidemia 09/09/2018  . 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  . Knee pain, acute 03/05/2020  . 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 Major depressive disorder 09/09/2018  . Microscopic hematuria 09/09/2018  . Multiple benign nevi 04/17/2016  . Non-seasonal allergic rhinitis 09/09/2018  . Osteoarthritis   . Palpitations 09/28/2013   Formatting of this note is different from  the original. 01/29/15:  24 Hour Holter Monitor CONCLUSIONS: 1. Overall normal 24-hour Holter monitor recording, with rare, brief,  asymptomatic nonsustained supraventricular tachycardia as described.  2. No cardiac rhythm explanation for patient's symptoms.     . Primary osteoarthritis of both knees 10/07/2016   Formatting of this note might be different from the original. 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  Marland Kitchen Primary osteoarthritis of right knee 11/21/2014  . S/P laparoscopic fundoplication 04/25/931  . Scar condition and fibrosis of skin 04/17/2016   Formatting of this note might be different from the original. lower abdomen  . Sebaceous hyperplasia 04/17/2016  . Seborrheic keratoses 04/17/2016  . Skin tags, multiple acquired 04/17/2016  . Solar lentigo 04/17/2016  . Subarachnoid hemorrhage following injury 2010   Fall from horse; brief hospitalization    Past Surgical History:  Procedure Laterality Date  . ESOPHAGOGASTRODUODENOSCOPY     possibly last one was 2017. In [place]  . HERNIA REPAIR    . MEDIAL PARTIAL KNEE REPLACEMENT Right 12/14/2014  . NISSEN FUNDOPLICATION  35/57/3220  . OOPHORECTOMY    . PARTIAL KNEE ARTHROPLASTY Left 04/05/2014    Current Medications: Current Meds  Medication Sig  . Cetirizine HCl 10 MG CAPS Take 10 mg by mouth daily.   . Cholecalciferol (VITAMIN D-3) 1000 units CAPS Take 1 capsule by mouth daily.   . clonazePAM (KLONOPIN) 0.5 MG tablet Take 0.5-1 tablets (0.25-0.5 mg total) by mouth 2 (two) times daily as needed for anxiety. (Patient taking differently: Take 0.25-0.5 mg by mouth as needed for anxiety. )  . Cyanocobalamin (B-12 PO) Take by mouth daily.   . diclofenac Sodium (VOLTAREN) 1 % GEL Place onto the skin as needed.   Marland Kitchen ELIQUIS 5 MG TABS tablet TAKE 1 TABLET TWICE A DAY  . flecainide (TAMBOCOR) 50 MG tablet TAKE 1 TABLET(50 MG) BY MOUTH TWICE DAILY  . FLUoxetine (PROZAC) 10 MG tablet Take 10 mg by mouth daily.  Marland Kitchen losartan (COZAAR) 50 MG tablet Take 25 mg by mouth daily.  Marland Kitchen LYSINE PO Take 1 tablet by mouth daily as needed.   . metoprolol succinate (TOPROL-XL) 25 MG 24 hr tablet TAKE 1 TABLET(25 MG) BY MOUTH DAILY     Allergies:   Rosuvastatin and Sulfa antibiotics   Social History   Socioeconomic History  . Marital status: Married    Spouse name: Not on file  . Number of  children: 0  . Years of education: 1  . Highest education level: Master's degree (e.g., MA, MS, MEng, MEd, MSW, MBA)  Occupational History  . Occupation: Retired  Tobacco Use  . Smoking status: Former Smoker    Types: Cigarettes    Quit date: 08/18/1983    Years since quitting: 37.0  . Smokeless tobacco: Never Used  Vaping Use  . Vaping Use: Never used  Substance and Sexual Activity  . Alcohol use: Yes    Alcohol/week: 7.0 - 14.0 standard drinks    Types: 7 - 14 Glasses of wine per week    Comment: 1-2 drinks per night, usually wine  . Drug use: Never  . Sexual activity: Yes    Partners: Male    Birth control/protection: None  Other Topics Concern  . Not on file  Social History Narrative  . Not on file   Social Determinants of Health   Financial Resource Strain:   . Difficulty of Paying Living Expenses: Not on file  Food Insecurity:   .  Worried About Charity fundraiser in the Last Year: Not on file  . Ran Out of Food in the Last Year: Not on file  Transportation Needs:   . Lack of Transportation (Medical): Not on file  . Lack of Transportation (Non-Medical): Not on file  Physical Activity:   . Days of Exercise per Week: Not on file  . Minutes of Exercise per Session: Not on file  Stress:   . Feeling of Stress : Not on file  Social Connections:   . Frequency of Communication with Friends and Family: Not on file  . Frequency of Social Gatherings with Friends and Family: Not on file  . Attends Religious Services: Not on file  . Active Member of Clubs or Organizations: Not on file  . Attends Archivist Meetings: Not on file  . Marital Status: Not on file     Family History: The patient's family history includes Alzheimer's disease in her father; Atrial fibrillation in her sister; Diabetes in her mother; Heart disease in her mother; Tremor in an other family member. There is no history of Colon cancer or Esophageal cancer. ROS:   Please see the history of  present illness.    All other systems reviewed and are negative.  EKGs/Labs/Other Studies Reviewed:    The following studies were reviewed today:  EKG:  EKG ordered today and personally reviewed.  The ekg ordered today demonstrates sinus rhythm normal EKG QS in V2 lead placement  Recent Labs: 02/22/2020: ALT 22; BUN 14; Creat 0.65; Hemoglobin 14.2; Platelets 280; Potassium 3.9; Sodium 141  Recent Lipid Panel    Component Value Date/Time   CHOL 220 (H) 02/22/2020 0920   TRIG 229 (H) 02/22/2020 0920   HDL 48 (L) 02/22/2020 0920   CHOLHDL 4.6 02/22/2020 0920   LDLCALC 135 (H) 02/22/2020 0920    Physical Exam:    VS:  BP 136/84   Pulse (!) 58   Ht 5\' 3"  (1.6 m)   Wt 185 lb 1.3 oz (84 kg)   SpO2 96%   BMI 32.79 kg/m     Wt Readings from Last 3 Encounters:  09/11/20 185 lb 1.3 oz (84 kg)  07/17/20 182 lb 8 oz (82.8 kg)  06/06/20 179 lb (81.2 kg)     GEN:  Well nourished, well developed in no acute distress HEENT: Normal NECK: No JVD; No carotid bruits LYMPHATICS: No lymphadenopathy CARDIAC: RRR, no murmurs, rubs, gallops RESPIRATORY:  Clear to auscultation without rales, wheezing or rhonchi  ABDOMEN: Soft, non-tender, non-distended MUSCULOSKELETAL:  No edema; No deformity  SKIN: Warm and dry NEUROLOGIC:  Alert and oriented x 3 PSYCHIATRIC:  Normal affect    Signed, Melissa More, MD  09/11/2020 10:07 AM    [facility]

## 2020-09-13 ENCOUNTER — Ambulatory Visit (INDEPENDENT_AMBULATORY_CARE_PROVIDER_SITE_OTHER): Payer: Medicare Other | Admitting: Osteopathic Medicine

## 2020-09-13 ENCOUNTER — Encounter: Payer: Self-pay | Admitting: Osteopathic Medicine

## 2020-09-13 ENCOUNTER — Ambulatory Visit (INDEPENDENT_AMBULATORY_CARE_PROVIDER_SITE_OTHER): Payer: Medicare Other

## 2020-09-13 ENCOUNTER — Ambulatory Visit: Payer: Medicare Other

## 2020-09-13 ENCOUNTER — Other Ambulatory Visit: Payer: Self-pay

## 2020-09-13 VITALS — BP 134/81 | HR 60 | Temp 97.9°F | Wt 184.0 lb

## 2020-09-13 DIAGNOSIS — G4700 Insomnia, unspecified: Secondary | ICD-10-CM

## 2020-09-13 DIAGNOSIS — M25512 Pain in left shoulder: Secondary | ICD-10-CM | POA: Diagnosis not present

## 2020-09-13 DIAGNOSIS — M25551 Pain in right hip: Secondary | ICD-10-CM

## 2020-09-13 DIAGNOSIS — G8929 Other chronic pain: Secondary | ICD-10-CM

## 2020-09-13 DIAGNOSIS — M19072 Primary osteoarthritis, left ankle and foot: Secondary | ICD-10-CM | POA: Diagnosis not present

## 2020-09-13 DIAGNOSIS — R5382 Chronic fatigue, unspecified: Secondary | ICD-10-CM | POA: Diagnosis not present

## 2020-09-13 DIAGNOSIS — M79672 Pain in left foot: Secondary | ICD-10-CM | POA: Diagnosis not present

## 2020-09-13 DIAGNOSIS — M25552 Pain in left hip: Secondary | ICD-10-CM | POA: Diagnosis not present

## 2020-09-13 DIAGNOSIS — M419 Scoliosis, unspecified: Secondary | ICD-10-CM

## 2020-09-13 DIAGNOSIS — M545 Low back pain, unspecified: Secondary | ICD-10-CM | POA: Diagnosis not present

## 2020-09-13 MED ORDER — CYCLOBENZAPRINE HCL 10 MG PO TABS
5.0000 mg | ORAL_TABLET | Freq: Three times a day (TID) | ORAL | 1 refills | Status: DC | PRN
Start: 2020-09-13 — End: 2020-09-14

## 2020-09-13 MED ORDER — ACETAMINOPHEN-CODEINE #3 300-30 MG PO TABS: 1.0000 | ORAL_TABLET | ORAL | 0 refills | Status: DC | PRN

## 2020-09-13 MED ORDER — FLUOXETINE HCL 10 MG PO TABS
10.0000 mg | ORAL_TABLET | Freq: Every day | ORAL | 0 refills | Status: DC
Start: 2020-09-13 — End: 2020-12-10

## 2020-09-13 NOTE — Patient Instructions (Addendum)
Plan:  Refer for formal physical therapy Xrays today foot and shoulder  Prescriptions:  Muscle relaxer (cyclobenzaprine) as needed for muscle spasm - may help to take in the evening / bedtime   Severe pain (Tylenol #3) medication to use as needed   Recheck w/ Dr Sheppard Coil in 2-4 weeks  If PT and Rx etc helping, nothing else to change May consider change Prozac to Celexa to help mental heath as well as pain  Please contact our office for sports medicine appointment with Dr T for shoulder pain

## 2020-09-13 NOTE — Progress Notes (Signed)
Melissa Rodgers is a 69 y.o. female who presents to  [facility]  today, 09/13/20, seeking care for the following:  Marland Kitchen MSK pain - multiple sites      ASSESSMENT & PLAN with other pertinent findings:  The primary encounter diagnosis was Chronic left shoulder pain. Diagnoses of Chronic bilateral low back pain without sciatica, Scoliosis, unspecified scoliosis type, unspecified spinal region, Chronic pain of left heel, Bilateral hip pain, Chronic fatigue, and Insomnia, unspecified type were also pertinent to this visit.    1. Chronic left shoulder pain Ongoing on and off for years.  Previously helped by physical therapy.  Hurts worse when she reaches behind her leg if she is rolling over to get more blankets while sleeping, reaching behind her in the car, etc.  No known previous diagnosis but was told at some point she would probably need an MRI.  On exam, positive apprehension test with abduction and internal rotation.  Suspect possible bursitis or rotator cuff problem.  Physical therapy referral placed  2. Chronic bilateral low back pain without sciatica Negative straight leg raise bilaterally, patient states overall not really bothering her but was concerned given that she took a trip to the beach several weeks ago and was not really able to walk on the sand even though it was not very deep sand  3. Scoliosis, unspecified scoliosis type, unspecified spinal region Patient is concerned that this may be contributing to widespread pain issues  4. Chronic pain of left heel Doing a lot better since changing footwear, history of plantar fasciitis and she thinks this is similar.  Will obtain x-ray today, continue foot wear and home exercises as she has been doing since condition is improving.  5. Bilateral hip pain Normal exam today, patient states not bothering her at this point, will defer further work-up for now  6. Chronic  fatigue Patient states has not been sleeping as well some days, other days will experience hypersomnia.  Requesting labs  7. Insomnia, unspecified type Advised melatonin, good sleep hygiene discussed    Patient Instructions  Plan:  Refer for formal physical therapy Xrays today foot and shoulder  Prescriptions:  Muscle relaxer (cyclobenzaprine) as needed for muscle spasm - may help to take in the evening / bedtime   Severe pain (Tylenol #3) medication to use as needed   Recheck w/ Dr Sheppard Coil in 2-4 weeks  If PT and Rx etc helping, nothing else to change May consider change Prozac to Celexa to help mental heath as well as pain  Please contact our office for sports medicine appointment with Dr T for shoulder pain       Orders Placed This Encounter  Procedures  . DG Shoulder Left  . DG Foot Complete Left  . CBC  . COMPLETE METABOLIC PANEL WITH GFR  . TSH  . Ambulatory referral to Physical Therapy    Meds ordered this encounter  Medications  . acetaminophen-codeine (TYLENOL #3) 300-30 MG tablet    Sig: Take 1-2 tablets by mouth every 4 (four) hours as needed for moderate pain.    Dispense:  30 tablet    Refill:  0  . cyclobenzaprine (FLEXERIL) 10 MG tablet    Sig: Take 0.5-1 tablets (5-10 mg total) by mouth 3 (three) times daily as needed for muscle spasms. Caution: can cause drowsiness    Dispense:  60 tablet    Refill:  1  . FLUoxetine (PROZAC) 10 MG tablet  Sig: Take 1 tablet (10 mg total) by mouth daily.    Dispense:  90 tablet    Refill:  0       Follow-up instructions: Return for recheck w/ Dr Sheppard Coil 2-4 weeks depending on response to physical therapy - call us!Marland Kitchen                                         BP 134/81 (BP Location: Left Arm, Patient Position: Sitting, Cuff Size: Normal)   Pulse 60   Temp 97.9 F (36.6 C) (Oral)   Wt 184 lb 0.6 oz (83.5 kg)   BMI 32.60 kg/m   Current Meds  Medication Sig  .  amoxicillin (AMOXIL) 500 MG capsule TAKE 4 CAPSULES 1 HOURS BEFORE DENTAL APPOINTMENTS.  Marland Kitchen Cetirizine HCl 10 MG CAPS Take 10 mg by mouth daily.   . Cholecalciferol (VITAMIN D-3) 1000 units CAPS Take 1 capsule by mouth daily.   . clonazePAM (KLONOPIN) 0.5 MG tablet Take 0.5-1 tablets (0.25-0.5 mg total) by mouth 2 (two) times daily as needed for anxiety. (Patient taking differently: Take 0.25-0.5 mg by mouth as needed for anxiety. )  . Cyanocobalamin (B-12 PO) Take by mouth daily.   . diclofenac Sodium (VOLTAREN) 1 % GEL Place onto the skin as needed.   . diltiazem (CARDIZEM) 30 MG tablet Take 1 tablet (30 mg total) by mouth every 4 (four) hours as needed.  Marland Kitchen ELIQUIS 5 MG TABS tablet TAKE 1 TABLET TWICE A DAY  . flecainide (TAMBOCOR) 50 MG tablet TAKE 1 TABLET(50 MG) BY MOUTH TWICE DAILY  . FLUoxetine (PROZAC) 10 MG tablet Take 1 tablet (10 mg total) by mouth daily.  Marland Kitchen losartan (COZAAR) 50 MG tablet Take 25 mg by mouth daily.  Marland Kitchen LYSINE PO Take 1 tablet by mouth daily as needed.   . metoprolol succinate (TOPROL-XL) 25 MG 24 hr tablet TAKE 1 TABLET(25 MG) BY MOUTH DAILY  . [DISCONTINUED] FLUoxetine (PROZAC) 10 MG tablet Take 10 mg by mouth daily.    No results found for this or any previous visit (from the past 72 hour(s)).  No results found.     All questions at time of visit were answered - patient instructed to contact office with any additional concerns or updates.  ER/RTC precautions were reviewed with the patient as applicable.   Please note: voice recognition software was used to produce this document, and typos may escape review. Please contact Dr. Sheppard Coil for any needed clarifications.   Total encounter time: 40 minutes including face-to-face time, record review

## 2020-09-14 ENCOUNTER — Telehealth: Payer: Self-pay

## 2020-09-14 LAB — COMPLETE METABOLIC PANEL WITH GFR
AG Ratio: 1.4 (calc) (ref 1.0–2.5)
ALT: 19 U/L (ref 6–29)
AST: 21 U/L (ref 10–35)
Albumin: 4.2 g/dL (ref 3.6–5.1)
Alkaline phosphatase (APISO): 66 U/L (ref 37–153)
BUN: 14 mg/dL (ref 7–25)
CO2: 26 mmol/L (ref 20–32)
Calcium: 9.3 mg/dL (ref 8.6–10.4)
Chloride: 105 mmol/L (ref 98–110)
Creat: 0.63 mg/dL (ref 0.60–0.93)
GFR, Est African American: 105 mL/min/{1.73_m2} (ref >=60)
GFR, Est Non African American: 91 mL/min/{1.73_m2} (ref >=60)
Globulin: 2.9 g/dL (calc) (ref 1.9–3.7)
Glucose, Bld: 91 mg/dL (ref 65–139)
Potassium: 4.2 mmol/L (ref 3.5–5.3)
Sodium: 139 mmol/L (ref 135–146)
Total Bilirubin: 0.4 mg/dL (ref 0.2–1.2)
Total Protein: 7.1 g/dL (ref 6.1–8.1)

## 2020-09-14 LAB — TSH: TSH: 2.78 mIU/L (ref 0.40–4.50)

## 2020-09-14 LAB — CBC
HCT: 42.1 % (ref 35.0–45.0)
Hemoglobin: 14.1 g/dL (ref 11.7–15.5)
MCH: 30.1 pg (ref 27.0–33.0)
MCHC: 33.5 g/dL (ref 32.0–36.0)
MCV: 90 fL (ref 80.0–100.0)
MPV: 10.2 fL (ref 7.5–12.5)
Platelets: 278 10*3/uL (ref 140–400)
RBC: 4.68 10*6/uL (ref 3.80–5.10)
RDW: 13.5 % (ref 11.0–15.0)
WBC: 6.9 10*3/uL (ref 3.8–10.8)

## 2020-09-14 MED ORDER — METHOCARBAMOL 500 MG PO TABS
500.0000 mg | ORAL_TABLET | Freq: Three times a day (TID) | ORAL | 0 refills | Status: DC | PRN
Start: 2020-09-14 — End: 2020-12-03

## 2020-09-14 NOTE — Telephone Encounter (Signed)
Per [facility]-   Cyclobenzaprine is not covered by insurance. Preferred formulary are tizanidine tab, methocarbamol tab or carisoprodol tab.   Pls send an updated rx to the pharmacy. Thanks.

## 2020-09-20 ENCOUNTER — Ambulatory Visit: Payer: Medicare Other | Admitting: Rehabilitative and Restorative Service Providers"

## 2020-09-21 ENCOUNTER — Telehealth: Payer: Self-pay | Admitting: *Deleted

## 2020-09-21 NOTE — Telephone Encounter (Signed)
Methocarbamol approved through ins.  Approval dates [id number]/22. pharmacy notified.

## 2020-09-30 ENCOUNTER — Other Ambulatory Visit: Payer: Self-pay | Admitting: Cardiology

## 2020-10-01 ENCOUNTER — Ambulatory Visit (INDEPENDENT_AMBULATORY_CARE_PROVIDER_SITE_OTHER): Payer: Medicare Other | Admitting: Rehabilitative and Restorative Service Providers"

## 2020-10-01 ENCOUNTER — Other Ambulatory Visit: Payer: Self-pay

## 2020-10-01 DIAGNOSIS — R29898 Other symptoms and signs involving the musculoskeletal system: Secondary | ICD-10-CM

## 2020-10-01 DIAGNOSIS — M25512 Pain in left shoulder: Secondary | ICD-10-CM

## 2020-10-01 DIAGNOSIS — R293 Abnormal posture: Secondary | ICD-10-CM | POA: Diagnosis not present

## 2020-10-01 DIAGNOSIS — G8929 Other chronic pain: Secondary | ICD-10-CM | POA: Diagnosis not present

## 2020-10-01 NOTE — Patient Instructions (Signed)
Access Code: [email or web address] URL: [email or web address] Date: 10/01/2020 Prepared by: Rudell Cobb  Exercises Supine Shoulder Press - 2 x daily - 7 x weekly - 1 sets - 10 reps Supine Single Arm Shoulder External Rotation AROM - 2 x daily - 7 x weekly - 1 sets - 10 reps Seated Shoulder Circles - 2 x daily - 7 x weekly - 1 sets - 10 reps Seated Scapular Retraction - 2 x daily - 7 x weekly - 1 sets - 10 reps Circular Shoulder Pendulum with Table Support - 2 x daily - 7 x weekly - 1 sets - 10 reps  Patient Education Trigger Point Dry Needling

## 2020-10-01 NOTE — Therapy (Signed)
[place], [place], [address] Phone: [phone]   Fax:  [phone]  Physical Therapy Evaluation  Patient Details  Name: Melissa Rodgers MRN: [id number] Date of Birth: 1951/02/22 Referring Provider (PT): [name], DO   Encounter Date: 10/01/2020   PT End of Session - 10/01/20 1622    Visit Number 1    Number of Visits 12    Date for PT Re-Evaluation 11/12/20    Authorization Type medicare    PT Start Time 1350    PT Stop Time 1430    PT Time Calculation (min) 40 min           Past Medical History:  Diagnosis Date  . Atrial fibrillation 06/18/2019  . BMI 30.0-30.9,adult 01/27/2013  . Candidosis of skin 04/17/2016   Formatting of this note might be different from the original. lower abdomen  . Cherry angioma 04/17/2016  . Chronic bilateral low back pain without sciatica 10/07/2016  . 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  Marland Kitchen Dermal nevus of left forearm 04/17/2016  . Diverticulitis of sigmoid colon 08/01/2019  . Dry eye syndrome of both eyes 09/09/2018  . Dry skin 04/17/2016  . Essential hypertension 09/09/2018  . Fibromyalgia   . Gastroesophageal reflux disease without esophagitis 11/06/2015  . Generalized anxiety disorder with panic attacks 08/01/2019  . History of palpitations 09/09/2018  . Hyperlipidemia 09/09/2018  . 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  . Knee pain, acute 03/05/2020  . 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 Major depressive disorder 09/09/2018  . Microscopic hematuria 09/09/2018  . Multiple benign nevi 04/17/2016  . Non-seasonal allergic rhinitis 09/09/2018  . Osteoarthritis   . Palpitations 09/28/2013   Formatting of this note is different from the original. 01/29/15:  24 Hour Holter Monitor CONCLUSIONS: 1. Overall normal 24-hour Holter monitor recording, with rare, brief,  asymptomatic nonsustained  supraventricular tachycardia as described.  2. No cardiac rhythm explanation for patient's symptoms.     . Primary osteoarthritis of both knees 10/07/2016   Formatting of this note might be different from the original. 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  Marland Kitchen Primary osteoarthritis of right knee 11/21/2014  . S/P laparoscopic fundoplication 5/00/9381  . Scar condition and fibrosis of skin 04/17/2016   Formatting of this note might be different from the original. lower abdomen  . Sebaceous hyperplasia 04/17/2016  . Seborrheic keratoses 04/17/2016  . Skin tags, multiple acquired 04/17/2016  . Solar lentigo 04/17/2016  . Subarachnoid hemorrhage following injury 2010   Fall from horse; brief hospitalization    Past Surgical History:  Procedure Laterality Date  . ESOPHAGOGASTRODUODENOSCOPY     possibly last one was 2017. In [place]  . HERNIA REPAIR    . MEDIAL PARTIAL KNEE REPLACEMENT Right 12/14/2014  . NISSEN FUNDOPLICATION  82/99/3716  . OOPHORECTOMY    . PARTIAL KNEE ARTHROPLASTY Left 04/05/2014    There were no vitals filed for this visit.    Subjective Assessment - 10/01/20 1352    Subjective The patient reports onset of L shoulder pain off and on for several years.  She reports anterior L shoulder burning and aching.  If she aggravates shoulder, the neck and shoulder blade begin to hurt.  She reports both arms have general weakness.  Pain can keep her from sleeping and wakes her up at night.    Patient Stated Goals Sleep comofortably and use  the arm without increasing pain    Currently in Pain? Yes    Pain Score --   "hardly hurting" today; can go up to 10/10; worse at night   Pain Location Shoulder    Pain Orientation Left;Anterior    Pain Descriptors / Indicators Burning;Aching;Sharp;Shooting;Sore;Discomfort    Pain Type Chronic pain    Pain Onset More than a month ago    Pain Frequency Intermittent    Aggravating Factors  night time between  0-3/54 and certain movements increase pain to 10/10    Pain Relieving Factors ice helps    Effect of Pain on Daily Activities sore to the touch on the left arm              [facility] PT Assessment - 10/01/20 1401      Assessment   Medical Diagnosis Chronic L shoulder pain    Referring Provider (PT) [name], DO    Onset Date/Surgical Date 09/13/20    Hand Dominance Left    Prior Therapy in the past for knees and shoulder      Precautions   Precautions None      Restrictions   Weight Bearing Restrictions No      Balance Screen   Has the patient fallen in the past 6 months No    Has the patient had a decrease in activity level because of a fear of falling?  No    Is the patient reluctant to leave their home because of a fear of falling?  No      Home Ecologist residence    Living Arrangements Spouse/significant other      Prior Function   Level of Agency Retired      Observation/Other Assessments   Focus on Therapeutic Outcomes (FOTO)  47% limitation      Sensation   Light Touch Appears Intact      Posture/Postural Control   Posture/Postural Control Postural limitations    Postural Limitations Rounded Shoulders;Forward head    Posture Comments L thoracic spine pain *due to h/o scoliosis      ROM / Strength   AROM / PROM / Strength AROM;Strength;PROM      AROM   Overall AROM  Deficits    AROM Assessment Site Shoulder;Elbow    Right/Left Shoulder Right;Left    Left Shoulder Flexion 170 Degrees    Left Shoulder ABduction 80 Degrees   with significant pain   Left Shoulder Internal Rotation --   to stomach   Left Shoulder External Rotation 60 Degrees   with towel roll under L elbow   Left Shoulder Horizontal ABduction --   Most painful motion**   Right/Left Elbow Left    Left Elbow Flexion --   WNLs   Left Elbow Extension --   WNLs     PROM   Overall PROM  Deficits    Overall PROM Comments  pain with PROM abduction       Strength   Overall Strength Deficits    Strength Assessment Site Shoulder    Right/Left Shoulder Left    Left Shoulder Flexion 3+/5   with some pain   Left Shoulder Internal Rotation 4/5    Left Shoulder External Rotation 3/5   weakness due to sharp pain     Flexibility   Soft Tissue Assessment /Muscle Length yes   pain with pectoralis stretch/palpation     Palpation   Palpation comment Tenderness to  palpation in L bicipital groove, upper trapezius, pectoralis musculature      Special Tests    Special Tests Rotator Cuff Impingement    Rotator Cuff Impingment tests Painful Arc of Motion;Lift- off test      Lift-Off test   Findings Negative    Side Left      Painful Arc of Motion   Findings Positive    Side Left    Comments when lowering arm from full flexion                      Objective measurements completed on examination: See above findings.       [facility] Adult PT Treatment/Exercise - 10/01/20 1401      Exercises   Exercises Shoulder      Shoulder Exercises: Supine   External Rotation AROM;Left;10 reps    Flexion Left;10 reps;AROM    Flexion Limitations chest press x 10 reps      Shoulder Exercises: Seated   Retraction Strengthening;Both;10 reps    External Rotation AROM;Both;10 reps      Shoulder Exercises: ROM/Strengthening   Pendulum L UE pendulum                  PT Education - 10/01/20 1432    Education Details HEP    Person(s) Educated Patient    Methods Explanation;Demonstration;Handout    Comprehension Returned demonstration;Verbalized understanding               PT Long Term Goals - 10/01/20 2031      PT LONG TERM GOAL #1   Title The patient will be indep with HEP.    Time 6    Period Weeks    Target Date 11/12/20      PT LONG TERM GOAL #2   Title The patient will reduce FOTO from 47% to 37% limitation.    Time 6    Period Weeks    Target Date 11/12/20      PT LONG TERM GOAL  #3   Title The patient will report reduced pain at night to < or equal to 3/10.    Time 6    Period Weeks    Target Date 11/12/20      PT LONG TERM GOAL #4   Title The patient will perform shoulder flexion and abduction without pain.    Time 6    Period Weeks    Target Date 11/12/20                  Plan - 10/01/20 2112    Clinical Impression Statement The patient is a 69 year old female with chronic L shoulder pain and scoliosis.  She presents with impairments in ROM, strength, muscle shortening, tenderness to palpation L bicipital groove, tenderness to palpation L cervical spine/upper trap.  Impairments lead to dec'd ability to sleep, weakness during ADLs and IADLs, and pain that limits function.    Examination-Activity Limitations Lift;Reach Overhead    Examination-Participation Restrictions Cleaning    Stability/Clinical Decision Making Stable/Uncomplicated    Clinical Decision Making Low    Rehab Potential Good    PT Frequency 2x / week    PT Duration 6 weeks    PT Treatment/Interventions ADLs/Self Care Home Management;Taping;Dry needling;Manual techniques;Therapeutic activities;Therapeutic exercise;Electrical Stimulation;Moist Heat;Cryotherapy;Patient/family education;Passive range of motion    PT Next Visit Plan DN to L biceps, deltoid and parascapular musculature (pateint unsure of DN in cervical musculature); isometrics, A/AROM, scapualr + postural strengthening  PT Home Exercise Plan Access Code: [email or web address]    Consulted and Agree with Plan of Care Patient           Patient will benefit from skilled therapeutic intervention in order to improve the following deficits and impairments:  Pain, Increased fascial restricitons, Decreased range of motion, Decreased strength, Postural dysfunction, Impaired flexibility, Hypomobility, Impaired UE functional use  Visit Diagnosis: Chronic left shoulder pain  Abnormal posture  Other symptoms and signs involving the  musculoskeletal system     Problem List Patient Active Problem List   Diagnosis Date Noted  . Fibromyalgia 03/05/2020  . Knee pain, acute 03/05/2020  . Osteoarthritis 03/05/2020  . Diverticulitis of sigmoid colon 08/01/2019  . Generalized anxiety disorder with panic attacks 08/01/2019  . Unspecified atrial fibrillation 06/18/2019  . Major depressive disorder 09/09/2018  . Dry eye syndrome of both eyes 09/09/2018  . Microscopic hematuria 09/09/2018  . Non-seasonal allergic rhinitis 09/09/2018  . Hyperlipidemia 09/09/2018  . Essential hypertension 09/09/2018  . History of palpitations 09/09/2018  . Chronic bilateral low back pain without sciatica 10/07/2016  . Chronic bilateral upper abdominal pain 10/07/2016  . Primary osteoarthritis of both knees 10/07/2016  . Candidosis of skin 04/17/2016  . Cherry angioma 04/17/2016  . Dry skin 04/17/2016  . Dermal nevus of left forearm 04/17/2016  . Multiple benign nevi 04/17/2016  . Scar condition and fibrosis of skin 04/17/2016  . Sebaceous hyperplasia 04/17/2016  . Seborrheic keratoses 04/17/2016  . Skin tags, multiple acquired 04/17/2016  . Solar lentigo 04/17/2016  . S/P laparoscopic fundoplication 50/01/7047  . Gastroesophageal reflux disease without esophagitis 11/06/2015  . 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  . Primary osteoarthritis of right knee 11/21/2014  . 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 Palpitations 09/28/2013  . BMI 30.0-30.9,adult 01/27/2013  . Subarachnoid hemorrhage following injury 2010    [place], PT 10/01/2020, 9:31 PM  [facility] [place], [place], [address] Phone: [phone]   Fax:  [phone]  Name: Melissa Rodgers MRN: [id number] Date of Birth: 02-15-1951

## 2020-10-05 ENCOUNTER — Encounter: Payer: Self-pay | Admitting: Rehabilitative and Restorative Service Providers"

## 2020-10-05 ENCOUNTER — Ambulatory Visit (INDEPENDENT_AMBULATORY_CARE_PROVIDER_SITE_OTHER): Payer: Medicare Other | Admitting: Rehabilitative and Restorative Service Providers"

## 2020-10-05 ENCOUNTER — Other Ambulatory Visit: Payer: Self-pay

## 2020-10-05 DIAGNOSIS — G8929 Other chronic pain: Secondary | ICD-10-CM | POA: Diagnosis not present

## 2020-10-05 DIAGNOSIS — R29898 Other symptoms and signs involving the musculoskeletal system: Secondary | ICD-10-CM | POA: Diagnosis not present

## 2020-10-05 DIAGNOSIS — M25512 Pain in left shoulder: Secondary | ICD-10-CM | POA: Diagnosis not present

## 2020-10-05 DIAGNOSIS — R293 Abnormal posture: Secondary | ICD-10-CM

## 2020-10-05 NOTE — Patient Instructions (Signed)
Access Code: [email or web address] URL: [email or web address] Date: 10/05/2020 Prepared by: Rudell Cobb  Exercises Supine Shoulder Press - 2 x daily - 7 x weekly - 1 sets - 10 reps Supine Single Arm Shoulder External Rotation AROM - 2 x daily - 7 x weekly - 1 sets - 10 reps Seated Shoulder Circles - 2 x daily - 7 x weekly - 1 sets - 3 reps Standing Anatomical Position with Scapular Retraction and Depression at Wall - 2 x daily - 7 x weekly - 1 sets - 10 reps Corner Pec Major Stretch - 2 x daily - 7 x weekly - 1 sets - 3 reps - 20-30 seconds hold Circular Shoulder Pendulum with Table Support - 2 x daily - 7 x weekly - 1 sets - 10 reps Standing Isometric Shoulder Internal Rotation with Towel Roll at Doorway - 2 x daily - 7 x weekly - 1 sets - 5 reps - 3-5 seconds hold Standing Isometric Shoulder Flexion with Doorway - Arm Bent - 2 x daily - 7 x weekly - 1 sets - 5 reps - 3-5 seconds hold  Patient Education [place] Patient Instructions Kinesiology tape

## 2020-10-05 NOTE — Therapy (Signed)
[place]  [place], [place], [address] Phone: [phone]   Fax:  [phone]  Physical Therapy Treatment  Patient Details  Name: Melissa Rodgers MRN: [id number] Date of Birth: 29-Oct-1951 Referring Provider (PT): [name], DO   Encounter Date: 10/05/2020   PT End of Session - 10/05/20 1642    Visit Number 2    Number of Visits 12    Date for PT Re-Evaluation 11/12/20    Authorization Type medicare    PT Start Time 0354    PT Stop Time 1620    PT Time Calculation (min) 47 min           Past Medical History:  Diagnosis Date  . Atrial fibrillation 06/18/2019  . BMI 30.0-30.9,adult 01/27/2013  . Candidosis of skin 04/17/2016   Formatting of this note might be different from the original. lower abdomen  . Cherry angioma 04/17/2016  . Chronic bilateral low back pain without sciatica 10/07/2016  . 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  Marland Kitchen Dermal nevus of left forearm 04/17/2016  . Diverticulitis of sigmoid colon 08/01/2019  . Dry eye syndrome of both eyes 09/09/2018  . Dry skin 04/17/2016  . Essential hypertension 09/09/2018  . Fibromyalgia   . Gastroesophageal reflux disease without esophagitis 11/06/2015  . Generalized anxiety disorder with panic attacks 08/01/2019  . History of palpitations 09/09/2018  . Hyperlipidemia 09/09/2018  . 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  . Knee pain, acute 03/05/2020  . 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 Major depressive disorder 09/09/2018  . Microscopic hematuria 09/09/2018  . Multiple benign nevi 04/17/2016  . Non-seasonal allergic rhinitis 09/09/2018  . Osteoarthritis   . Palpitations 09/28/2013   Formatting of this note is different from the original. 01/29/15:  24 Hour Holter Monitor CONCLUSIONS: 1. Overall normal 24-hour Holter monitor recording, with rare, brief,  asymptomatic nonsustained  supraventricular tachycardia as described.  2. No cardiac rhythm explanation for patient's symptoms.     . Primary osteoarthritis of both knees 10/07/2016   Formatting of this note might be different from the original. 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  Marland Kitchen Primary osteoarthritis of right knee 11/21/2014  . S/P laparoscopic fundoplication 6/56/8127  . Scar condition and fibrosis of skin 04/17/2016   Formatting of this note might be different from the original. lower abdomen  . Sebaceous hyperplasia 04/17/2016  . Seborrheic keratoses 04/17/2016  . Skin tags, multiple acquired 04/17/2016  . Solar lentigo 04/17/2016  . Subarachnoid hemorrhage following injury 2010   Fall from horse; brief hospitalization    Past Surgical History:  Procedure Laterality Date  . ESOPHAGOGASTRODUODENOSCOPY     possibly last one was 2017. In [place]  . HERNIA REPAIR    . MEDIAL PARTIAL KNEE REPLACEMENT Right 12/14/2014  . NISSEN FUNDOPLICATION  51/70/0174  . OOPHORECTOMY    . PARTIAL KNEE ARTHROPLASTY Left 04/05/2014    There were no vitals filed for this visit.   Subjective Assessment - 10/05/20 1536    Subjective The patient reports shoulder roll exercise aggravates her neck and shoulder.    Patient Stated Goals Sleep comofortably and use the arm without increasing pain    Currently in Pain? Yes    Pain Score 4     Pain Location Shoulder    Pain Orientation Left;Anterior    Pain Descriptors / Indicators Aching;Burning;Sharp;Shooting    Pain Type Chronic pain  Pain Onset More than a month ago    Pain Frequency Intermittent    Aggravating Factors  shoulder rolls aggravate    Pain Relieving Factors ice helps              [facility] PT Assessment - 10/05/20 1537      Assessment   Medical Diagnosis Chronic L shoulder pain    Referring Provider (PT) [name], DO    Onset Date/Surgical Date 09/13/20    Hand Dominance Left                          [facility] Adult PT Treatment/Exercise - 10/05/20 1537      Exercises   Exercises Shoulder      Shoulder Exercises: Sidelying   Other Sidelying Exercises scapular A/AROM with diagonal pattern D1/D2      Shoulder Exercises: Standing   Retraction Strengthening;Both;10 reps    Retraction Limitations near a wall working on retraction + depression    Other Standing Exercises Using physioball performing shoulder flexion x 10 reps AAROM, abduction AAROM x 10 reps      Shoulder Exercises: Isometric Strengthening   Flexion 3X5"    External Rotation Limitations    External Rotation Limitations unable to tolerate    Internal Rotation 3X5"    ABduction Limitations    ABduction Limitations unable to tolerate      Shoulder Exercises: Stretch   Corner Stretch Limitations began in doorframe and moved to corner to reduce pain in R bicipital groove      Modalities   Modalities Iontophoresis      Iontophoresis   Type of Iontophoresis Dexamethasone    Location L bicipital groove    Dose 1.0 cc    Time  4 hr patch      Manual Therapy   Manual Therapy Myofascial release;Soft tissue mobilization;Scapular mobilization;Taping    Manual therapy comments to reduce pain and improve muscle length    Soft tissue mobilization bicipital groove STM and IASTM and middle deltoid, STM upper trap, levator, and parascapular musculature    Myofascial Release L pec in sidelying    Scapular Mobilization R sidelying iwth PROM into assisted mobility    Kinesiotex Inhibit Muscle      Kinesiotix   Inhibit Muscle  to support L levator and upper trap to reduce tightness                  PT Education - 10/05/20 1642    Education Details HEP progression; iontophoresis; taping    Person(s) Educated Patient    Methods Explanation;Demonstration;Handout    Comprehension Verbalized understanding;Returned demonstration               PT Long Term Goals - 10/01/20 2031      PT  LONG TERM GOAL #1   Title The patient will be indep with HEP.    Time 6    Period Weeks    Target Date 11/12/20      PT LONG TERM GOAL #2   Title The patient will reduce FOTO from 47% to 37% limitation.    Time 6    Period Weeks    Target Date 11/12/20      PT LONG TERM GOAL #3   Title The patient will report reduced pain at night to < or equal to 3/10.    Time 6    Period Weeks    Target Date 11/12/20  PT LONG TERM GOAL #4   Title The patient will perform shoulder flexion and abduction without pain.    Time 6    Period Weeks    Target Date 11/12/20                 Plan - 10/05/20 1609    Clinical Impression Statement The patient has 1/10 pain after STM and PROM/release of pec and scapular musculature.  She has pain with resisted abduction and ER during isometrics.  PT added HEP to patient's tolerance.  She is not interested in dry needling due to a prior experience with accupuncture, therefore PT proceeded with STM.    Examination-Activity Limitations Lift;Reach Overhead;Carry    Examination-Participation Restrictions Cleaning    Stability/Clinical Decision Making Stable/Uncomplicated    Rehab Potential Good    PT Frequency 2x / week    PT Duration 6 weeks    PT Treatment/Interventions ADLs/Self Care Home Management;Taping;Dry needling;Manual techniques;Therapeutic activities;Therapeutic exercise;Electrical Stimulation;Moist Heat;Cryotherapy;Patient/family education;Passive range of motion;Iontophoresis 4mg /ml Dexamethasone    PT Next Visit Plan no DN per patient request, iontophoresis;  isometrics, A/AROM, scapualr + postural strengthening    PT Home Exercise Plan Access Code: [email or web address]    Consulted and Agree with Plan of Care Patient           Patient will benefit from skilled therapeutic intervention in order to improve the following deficits and impairments:  Pain, Increased fascial restricitons, Decreased range of motion, Decreased strength, Postural  dysfunction, Impaired flexibility, Hypomobility, Impaired UE functional use  Visit Diagnosis: Chronic left shoulder pain  Abnormal posture  Other symptoms and signs involving the musculoskeletal system     Problem List Patient Active Problem List   Diagnosis Date Noted  . Fibromyalgia 03/05/2020  . Knee pain, acute 03/05/2020  . Osteoarthritis 03/05/2020  . Diverticulitis of sigmoid colon 08/01/2019  . Generalized anxiety disorder with panic attacks 08/01/2019  . Unspecified atrial fibrillation 06/18/2019  . Major depressive disorder 09/09/2018  . Dry eye syndrome of both eyes 09/09/2018  . Microscopic hematuria 09/09/2018  . Non-seasonal allergic rhinitis 09/09/2018  . Hyperlipidemia 09/09/2018  . Essential hypertension 09/09/2018  . History of palpitations 09/09/2018  . Chronic bilateral low back pain without sciatica 10/07/2016  . Chronic bilateral upper abdominal pain 10/07/2016  . Primary osteoarthritis of both knees 10/07/2016  . Candidosis of skin 04/17/2016  . Cherry angioma 04/17/2016  . Dry skin 04/17/2016  . Dermal nevus of left forearm 04/17/2016  . Multiple benign nevi 04/17/2016  . Scar condition and fibrosis of skin 04/17/2016  . Sebaceous hyperplasia 04/17/2016  . Seborrheic keratoses 04/17/2016  . Skin tags, multiple acquired 04/17/2016  . Solar lentigo 04/17/2016  . S/P laparoscopic fundoplication 15/83/0940  . Gastroesophageal reflux disease without esophagitis 11/06/2015  . 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  . Primary osteoarthritis of right knee 11/21/2014  . 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 Palpitations 09/28/2013  . BMI 30.0-30.9,adult 01/27/2013  . Subarachnoid hemorrhage following injury 2010    [place], PT 10/05/2020, 4:48 PM  [facility] [place], [place], [address] Phone: [phone]   Fax:  [phone]  Name: Keah Lamba MRN: [id number] Date of Birth:  1951/08/11

## 2020-10-05 NOTE — Addendum Note (Signed)
Addended by: Rudell Cobb M on: 10/05/2020 04:53 PM   Modules accepted: Orders

## 2020-10-08 ENCOUNTER — Other Ambulatory Visit: Payer: Self-pay

## 2020-10-08 ENCOUNTER — Ambulatory Visit (INDEPENDENT_AMBULATORY_CARE_PROVIDER_SITE_OTHER): Payer: Medicare Other | Admitting: Rehabilitative and Restorative Service Providers"

## 2020-10-08 DIAGNOSIS — R29898 Other symptoms and signs involving the musculoskeletal system: Secondary | ICD-10-CM

## 2020-10-08 DIAGNOSIS — R293 Abnormal posture: Secondary | ICD-10-CM

## 2020-10-08 DIAGNOSIS — G8929 Other chronic pain: Secondary | ICD-10-CM

## 2020-10-08 DIAGNOSIS — M25512 Pain in left shoulder: Secondary | ICD-10-CM

## 2020-10-08 NOTE — Therapy (Signed)
[facility] [place], [place], [address] Phone: [phone]   Fax:  [phone]  Physical Therapy Treatment  Patient Details  Name: Melissa Rodgers MRN: [id number] Date of Birth: June 18, 1951 Referring Provider (PT): [name], DO   Encounter Date: 10/08/2020    Past Medical History:  Diagnosis Date  . Atrial fibrillation 06/18/2019  . BMI 30.0-30.9,adult 01/27/2013  . Candidosis of skin 04/17/2016   Formatting of this note might be different from the original. lower abdomen  . Cherry angioma 04/17/2016  . Chronic bilateral low back pain without sciatica 10/07/2016  . 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  Marland Kitchen Dermal nevus of left forearm 04/17/2016  . Diverticulitis of sigmoid colon 08/01/2019  . Dry eye syndrome of both eyes 09/09/2018  . Dry skin 04/17/2016  . Essential hypertension 09/09/2018  . Fibromyalgia   . Gastroesophageal reflux disease without esophagitis 11/06/2015  . Generalized anxiety disorder with panic attacks 08/01/2019  . History of palpitations 09/09/2018  . Hyperlipidemia 09/09/2018  . 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  . Knee pain, acute 03/05/2020  . 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 Major depressive disorder 09/09/2018  . Microscopic hematuria 09/09/2018  . Multiple benign nevi 04/17/2016  . Non-seasonal allergic rhinitis 09/09/2018  . Osteoarthritis   . Palpitations 09/28/2013   Formatting of this note is different from the original. 01/29/15:  24 Hour Holter Monitor CONCLUSIONS: 1. Overall normal 24-hour Holter monitor recording, with rare, brief,  asymptomatic nonsustained supraventricular tachycardia as described.  2. No cardiac rhythm explanation for patient's symptoms.     . Primary osteoarthritis of both knees 10/07/2016   Formatting of this note might be different from the original. 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  Marland Kitchen Primary osteoarthritis of right knee 11/21/2014  . S/P laparoscopic fundoplication 1/75/1025  . Scar condition and fibrosis of skin 04/17/2016   Formatting of this note might be different from the original. lower abdomen  . Sebaceous hyperplasia 04/17/2016  . Seborrheic keratoses 04/17/2016  . Skin tags, multiple acquired 04/17/2016  . Solar lentigo 04/17/2016  . Subarachnoid hemorrhage following injury 2010   Fall from horse; brief hospitalization    Past Surgical History:  Procedure Laterality Date  . ESOPHAGOGASTRODUODENOSCOPY     possibly last one was 2017. In [place]  . HERNIA REPAIR    . MEDIAL PARTIAL KNEE REPLACEMENT Right 12/14/2014  . NISSEN FUNDOPLICATION  85/27/7824  . OOPHORECTOMY    . PARTIAL KNEE ARTHROPLASTY Left 04/05/2014    There were no vitals filed for this visit.   Subjective Assessment - 10/08/20 1436    Subjective The patient felt that iontophoresis helped-- she slept well Friday night.  The exercises have been going well.  She is a little sore in the bicipital groove.    Patient Stated Goals Sleep comofortably and use the arm without increasing pain    Currently in Pain? Yes    Pain Score 5     Pain Location Shoulder    Pain Orientation Left;Anterior    Pain Descriptors / Indicators Aching;Burning    Pain Type Chronic pain    Pain Onset More than a month ago    Pain Frequency Intermittent    Aggravating Factors  fell this morning with some increased pain (rolled off a stool)    Pain Relieving Factors ice helps, ionto helps  [facility] PT Assessment - 10/08/20 1440      Assessment   Medical Diagnosis Chronic L shoulder pain    Referring Provider (PT) [name], DO    Onset Date/Surgical Date 09/13/20    Hand Dominance Left                         [facility] Adult PT Treatment/Exercise - 10/08/20 1440      Exercises   Exercises Shoulder;Neck      Shoulder  Exercises: Supine   Flexion Left;10 reps;AROM    Other Supine Exercises tricep press x 10 reps with shoulder flexed to 90 degrees      Shoulder Exercises: Sidelying   External Rotation Strengthening;Left;10 reps    Internal Rotation Strengthening;Left;10 reps    Other Sidelying Exercises scapular A/AROM with diagonal pattern D1/D2      Shoulder Exercises: ROM/Strengthening   UBE (Upper Arm Bike) L1 x 2.5 minutes standing      Shoulder Exercises: Isometric Strengthening   Flexion 5X5"    Internal Rotation 5X5"    ADduction 5X5"      Modalities   Modalities Iontophoresis      Iontophoresis   Type of Iontophoresis Dexamethasone    Location L bicipital groove    Dose 1.0 cc    Time  4 hr patch      Manual Therapy   Manual Therapy Joint mobilization;Soft tissue mobilization;Myofascial release;Manual Traction    Manual therapy comments to reduce pain and improve tissue mobility    Joint Mobilization CPA grade I-II low c-spine    Soft tissue mobilization bicipital groove STM and IASTM, middle deltoid, pecs and biceps    Scapular Mobilization R sidelying with scapular mobility    Manual Traction cervical manual traction    Kinesiotex Inhibit Muscle      Kinesiotix   Inhibit Muscle  to support L levator and upper trap to reduce tightness                       PT Long Term Goals - 10/01/20 2031      PT LONG TERM GOAL #1   Title The patient will be indep with HEP.    Time 6    Period Weeks    Target Date 11/12/20      PT LONG TERM GOAL #2   Title The patient will reduce FOTO from 47% to 37% limitation.    Time 6    Period Weeks    Target Date 11/12/20      PT LONG TERM GOAL #3   Title The patient will report reduced pain at night to < or equal to 3/10.    Time 6    Period Weeks    Target Date 11/12/20      PT LONG TERM GOAL #4   Title The patient will perform shoulder flexion and abduction without pain.    Time 6    Period Weeks    Target Date  11/12/20                 Plan - 10/08/20 1438    Clinical Impression Statement The patient had improved sxs over the weekend, but fell this morning off a stool and flared her L shoulder.  Pain reduces during therapy, however she continues with "catching" pain with ER and abduction in bicipital groove. Continue working to [employer].    Examination-Activity Limitations --    Examination-Participation Restrictions --  Stability/Clinical Decision Making --    Rehab Potential Good    PT Frequency 2x / week    PT Duration 6 weeks    PT Treatment/Interventions ADLs/Self Care Home Management;Taping;Dry needling;Manual techniques;Therapeutic activities;Therapeutic exercise;Electrical Stimulation;Moist Heat;Cryotherapy;Patient/family education;Passive range of motion;Iontophoresis 4mg /ml Dexamethasone    PT Next Visit Plan no DN per patient request, iontophoresis;  isometrics, A/AROM, scapualr + postural strengthening, STM as needed    PT Home Exercise Plan Access Code: [email or web address]    Consulted and Agree with Plan of Care Patient           Patient will benefit from skilled therapeutic intervention in order to improve the following deficits and impairments:     Visit Diagnosis: Chronic left shoulder pain  Abnormal posture  Other symptoms and signs involving the musculoskeletal system     Problem List Patient Active Problem List   Diagnosis Date Noted  . Fibromyalgia 03/05/2020  . Knee pain, acute 03/05/2020  . Osteoarthritis 03/05/2020  . Diverticulitis of sigmoid colon 08/01/2019  . Generalized anxiety disorder with panic attacks 08/01/2019  . Unspecified atrial fibrillation 06/18/2019  . Major depressive disorder 09/09/2018  . Dry eye syndrome of both eyes 09/09/2018  . Microscopic hematuria 09/09/2018  . Non-seasonal allergic rhinitis 09/09/2018  . Hyperlipidemia 09/09/2018  . Essential hypertension 09/09/2018  . History of palpitations 09/09/2018  . Chronic bilateral low  back pain without sciatica 10/07/2016  . Chronic bilateral upper abdominal pain 10/07/2016  . Primary osteoarthritis of both knees 10/07/2016  . Candidosis of skin 04/17/2016  . Cherry angioma 04/17/2016  . Dry skin 04/17/2016  . Dermal nevus of left forearm 04/17/2016  . Multiple benign nevi 04/17/2016  . Scar condition and fibrosis of skin 04/17/2016  . Sebaceous hyperplasia 04/17/2016  . Seborrheic keratoses 04/17/2016  . Skin tags, multiple acquired 04/17/2016  . Solar lentigo 04/17/2016  . S/P laparoscopic fundoplication 12/24/4386  . Gastroesophageal reflux disease without esophagitis 11/06/2015  . 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  . Primary osteoarthritis of right knee 11/21/2014  . 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 Palpitations 09/28/2013  . BMI 30.0-30.9,adult 01/27/2013  . Subarachnoid hemorrhage following injury 2010    Melissa Rodgers 10/08/2020, 4:32 PM  [facility] [place], [place], [address] Phone: [phone]   Fax:  [phone]  Name: Melissa Rodgers MRN: [id number] Date of Birth: [birth year]

## 2020-10-12 ENCOUNTER — Other Ambulatory Visit: Payer: Self-pay

## 2020-10-12 ENCOUNTER — Ambulatory Visit (INDEPENDENT_AMBULATORY_CARE_PROVIDER_SITE_OTHER): Payer: Medicare Other | Admitting: Rehabilitative and Restorative Service Providers"

## 2020-10-12 ENCOUNTER — Encounter: Payer: Self-pay | Admitting: Rehabilitative and Restorative Service Providers"

## 2020-10-12 DIAGNOSIS — R29898 Other symptoms and signs involving the musculoskeletal system: Secondary | ICD-10-CM

## 2020-10-12 DIAGNOSIS — R293 Abnormal posture: Secondary | ICD-10-CM | POA: Diagnosis not present

## 2020-10-12 DIAGNOSIS — M25512 Pain in left shoulder: Secondary | ICD-10-CM

## 2020-10-12 DIAGNOSIS — G8929 Other chronic pain: Secondary | ICD-10-CM | POA: Diagnosis not present

## 2020-10-12 NOTE — Therapy (Signed)
[place], [place], [address] Phone: [phone]   Fax:  [phone]  Physical Therapy Treatment  Patient Details  Name: Melissa Rodgers MRN: [id number] Date of Birth: 06/14/1951 Referring Provider (PT): [name], DO   Encounter Date: 10/12/2020   PT End of Session - 10/12/20 1654    Visit Number 4    Number of Visits 12    Date for PT Re-Evaluation 11/12/20    Authorization Type medicare    PT Start Time 1601    PT Stop Time 1407    PT Time Calculation (min) 50 min           Past Medical History:  Diagnosis Date  . Atrial fibrillation 06/18/2019  . BMI 30.0-30.9,adult 01/27/2013  . Candidosis of skin 04/17/2016   Formatting of this note might be different from the original. lower abdomen  . Cherry angioma 04/17/2016  . Chronic bilateral low back pain without sciatica 10/07/2016  . 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  Marland Kitchen Dermal nevus of left forearm 04/17/2016  . Diverticulitis of sigmoid colon 08/01/2019  . Dry eye syndrome of both eyes 09/09/2018  . Dry skin 04/17/2016  . Essential hypertension 09/09/2018  . Fibromyalgia   . Gastroesophageal reflux disease without esophagitis 11/06/2015  . Generalized anxiety disorder with panic attacks 08/01/2019  . History of palpitations 09/09/2018  . Hyperlipidemia 09/09/2018  . 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  . Knee pain, acute 03/05/2020  . 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 Major depressive disorder 09/09/2018  . Microscopic hematuria 09/09/2018  . Multiple benign nevi 04/17/2016  . Non-seasonal allergic rhinitis 09/09/2018  . Osteoarthritis   . Palpitations 09/28/2013   Formatting of this note is different from the original. 01/29/15:  24 Hour Holter Monitor CONCLUSIONS: 1. Overall normal 24-hour Holter monitor recording, with rare, brief,  asymptomatic nonsustained  supraventricular tachycardia as described.  2. No cardiac rhythm explanation for patient's symptoms.     . Primary osteoarthritis of both knees 10/07/2016   Formatting of this note might be different from the original. 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  Marland Kitchen Primary osteoarthritis of right knee 11/21/2014  . S/P laparoscopic fundoplication 0/93/2355  . Scar condition and fibrosis of skin 04/17/2016   Formatting of this note might be different from the original. lower abdomen  . Sebaceous hyperplasia 04/17/2016  . Seborrheic keratoses 04/17/2016  . Skin tags, multiple acquired 04/17/2016  . Solar lentigo 04/17/2016  . Subarachnoid hemorrhage following injury 2010   Fall from horse; brief hospitalization    Past Surgical History:  Procedure Laterality Date  . ESOPHAGOGASTRODUODENOSCOPY     possibly last one was 2017. In [place]  . HERNIA REPAIR    . MEDIAL PARTIAL KNEE REPLACEMENT Right 12/14/2014  . NISSEN FUNDOPLICATION  73/22/0254  . OOPHORECTOMY    . PARTIAL KNEE ARTHROPLASTY Left 04/05/2014    There were no vitals filed for this visit.   Subjective Assessment - 10/12/20 1323    Subjective The patient reports she is doing a lot wiht her left arm.  She was doing better and then slept on it wrong.  Last visit, she had fallen on her L shoulder and was sore then too.  She avoids reaching behind her wit the L arm and pressing up into her L shoulder in bed.    Patient Stated Goals Sleep comofortably and use the  arm without increasing pain    Currently in Pain? Yes    Pain Score 2     Pain Location Shoulder    Pain Orientation Left;Anterior    Pain Descriptors / Indicators Aching;Burning    Pain Type Chronic pain    Pain Onset More than a month ago    Pain Frequency Intermittent    Aggravating Factors  reaching behind her    Pain Relieving Factors ice helps, ionto helps              [facility] PT Assessment - 10/12/20 1320      Assessment    Medical Diagnosis Chronic L shoulder pain    Referring Provider (PT) [name], DO    Onset Date/Surgical Date 09/13/20                         [facility] Adult PT Treatment/Exercise - 10/12/20 1321      Exercises   Exercises Shoulder;Neck      Shoulder Exercises: Supine   External Rotation Both;10 reps    External Rotation Limitations arms near head moving elbows into flexion/abduction + ER       Shoulder Exercises: Standing   External Rotation AROM;AAROM;Left;10 reps    External Rotation Limitations using door frame to gain greater stretch    Flexion AAROM;Left;10 reps    Flexion Limitations pillow case slide along wall    Retraction Strengthening;Both;10 reps    Other Standing Exercises scaption to shoulder height x s bilat x 5 reps      Shoulder Exercises: ROM/Strengthening   UBE (Upper Arm Bike) L1 x 2.5 minutes standing/ 1 minute backwards      Shoulder Exercises: Isometric Strengthening   Extension 3X5"    Extension Limitations in supine    Internal Rotation 5X5"    Internal Rotation Limitations in supine      Modalities   Modalities Iontophoresis      Iontophoresis   Type of Iontophoresis Dexamethasone    Location L bicipital groove    Dose 1.0 cc    Time  4 hr patch      Manual Therapy   Manual Therapy Soft tissue mobilization;Joint mobilization;Scapular mobilization    Manual therapy comments to reduce pain and improve tissue mobility    Joint Mobilization AC joint AP mobs grade II    Soft tissue mobilization bicipital groove STM and STM middle deltoids *taught patient self mob with ball against wall    Scapular Mobilization R sidelying with scapular mobility                       PT Long Term Goals - 10/01/20 2031      PT LONG TERM GOAL #1   Title The patient will be indep with HEP.    Time 6    Period Weeks    Target Date 11/12/20      PT LONG TERM GOAL #2   Title The patient will reduce FOTO from 47% to 37%  limitation.    Time 6    Period Weeks    Target Date 11/12/20      PT LONG TERM GOAL #3   Title The patient will report reduced pain at night to < or equal to 3/10.    Time 6    Period Weeks    Target Date 11/12/20      PT LONG TERM GOAL #4   Title The patient will  perform shoulder flexion and abduction without pain.    Time 6    Period Weeks    Target Date 11/12/20                 Plan - 10/12/20 1654    Clinical Impression Statement The patient is improving with mobility but continues to have episodes of grabbing pain.  In therapy, this is provoked with passive supine shoulder abduction.  She has significant soft tissue tightness anterior Left shoulder into bicipital groove.  Discussed home STM.  Plan to continue to patient tolerance.    Rehab Potential Good    PT Frequency 2x / week    PT Duration 6 weeks    PT Treatment/Interventions ADLs/Self Care Home Management;Taping;Dry needling;Manual techniques;Therapeutic activities;Therapeutic exercise;Electrical Stimulation;Moist Heat;Cryotherapy;Patient/family education;Passive range of motion;Iontophoresis 4mg /ml Dexamethasone    PT Next Visit Plan no DN per patient request, iontophoresis anterior L shoulder;  isometrics, A/AROM, scapualr + postural strengthening, STM as needed.  *Pt to call to schedule with Dr. Darene Lamer if not improving by next 2 visits (6th visit)    PT Home Exercise Plan Access Code: [email or web address]    Consulted and Agree with Plan of Care Patient           Patient will benefit from skilled therapeutic intervention in order to improve the following deficits and impairments:     Visit Diagnosis: Chronic left shoulder pain  Abnormal posture  Other symptoms and signs involving the musculoskeletal system     Problem List Patient Active Problem List   Diagnosis Date Noted  . Fibromyalgia 03/05/2020  . Knee pain, acute 03/05/2020  . Osteoarthritis 03/05/2020  . Diverticulitis of sigmoid colon 08/01/2019  .  Generalized anxiety disorder with panic attacks 08/01/2019  . Unspecified atrial fibrillation 06/18/2019  . Major depressive disorder 09/09/2018  . Dry eye syndrome of both eyes 09/09/2018  . Microscopic hematuria 09/09/2018  . Non-seasonal allergic rhinitis 09/09/2018  . Hyperlipidemia 09/09/2018  . Essential hypertension 09/09/2018  . History of palpitations 09/09/2018  . Chronic bilateral low back pain without sciatica 10/07/2016  . Chronic bilateral upper abdominal pain 10/07/2016  . Primary osteoarthritis of both knees 10/07/2016  . Candidosis of skin 04/17/2016  . Cherry angioma 04/17/2016  . Dry skin 04/17/2016  . Dermal nevus of left forearm 04/17/2016  . Multiple benign nevi 04/17/2016  . Scar condition and fibrosis of skin 04/17/2016  . Sebaceous hyperplasia 04/17/2016  . Seborrheic keratoses 04/17/2016  . Skin tags, multiple acquired 04/17/2016  . Solar lentigo 04/17/2016  . S/P laparoscopic fundoplication 26/94/8546  . Gastroesophageal reflux disease without esophagitis 11/06/2015  . 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  . Primary osteoarthritis of right knee 11/21/2014  . 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 Palpitations 09/28/2013  . BMI 30.0-30.9,adult 01/27/2013  . Subarachnoid hemorrhage following injury 2010    Melissa Rodgers, PT 10/12/2020, 4:58 PM  [place], [place], [address] Phone: [phone]   Fax:  [phone]  Name: Ronnell Makarewicz MRN: [id number] Date of Birth: 10/20/1951

## 2020-10-15 ENCOUNTER — Encounter: Payer: Medicare Other | Admitting: Physical Therapy

## 2020-10-22 ENCOUNTER — Encounter: Payer: Self-pay | Admitting: Rehabilitative and Restorative Service Providers"

## 2020-10-22 ENCOUNTER — Ambulatory Visit (INDEPENDENT_AMBULATORY_CARE_PROVIDER_SITE_OTHER): Payer: Medicare Other | Admitting: Rehabilitative and Restorative Service Providers"

## 2020-10-22 ENCOUNTER — Other Ambulatory Visit: Payer: Self-pay

## 2020-10-22 DIAGNOSIS — R293 Abnormal posture: Secondary | ICD-10-CM | POA: Diagnosis not present

## 2020-10-22 DIAGNOSIS — R29898 Other symptoms and signs involving the musculoskeletal system: Secondary | ICD-10-CM

## 2020-10-22 DIAGNOSIS — M25512 Pain in left shoulder: Secondary | ICD-10-CM | POA: Diagnosis not present

## 2020-10-22 DIAGNOSIS — M25562 Pain in left knee: Secondary | ICD-10-CM

## 2020-10-22 DIAGNOSIS — G8929 Other chronic pain: Secondary | ICD-10-CM | POA: Diagnosis not present

## 2020-10-22 NOTE — Therapy (Signed)
[place]  [place], [place], [address] Phone: [phone]   Fax:  [phone]  Physical Therapy Treatment  Patient Details  Name: Melissa Rodgers MRN: [id number] Date of Birth: Mar 07, 1951 Referring Provider (PT): [name], DO   Encounter Date: 10/22/2020   PT End of Session - 10/22/20 1354    Visit Number 5    Number of Visits 12    Date for PT Re-Evaluation 11/12/20    Authorization Type medicare    PT Start Time 6962    PT Stop Time 1430    PT Time Calculation (min) 41 min           Past Medical History:  Diagnosis Date  . Atrial fibrillation 06/18/2019  . BMI 30.0-30.9,adult 01/27/2013  . Candidosis of skin 04/17/2016   Formatting of this note might be different from the original. lower abdomen  . Cherry angioma 04/17/2016  . Chronic bilateral low back pain without sciatica 10/07/2016  . 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  Marland Kitchen Dermal nevus of left forearm 04/17/2016  . Diverticulitis of sigmoid colon 08/01/2019  . Dry eye syndrome of both eyes 09/09/2018  . Dry skin 04/17/2016  . Essential hypertension 09/09/2018  . Fibromyalgia   . Gastroesophageal reflux disease without esophagitis 11/06/2015  . Generalized anxiety disorder with panic attacks 08/01/2019  . History of palpitations 09/09/2018  . Hyperlipidemia 09/09/2018  . 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  . Knee pain, acute 03/05/2020  . 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 Major depressive disorder 09/09/2018  . Microscopic hematuria 09/09/2018  . Multiple benign nevi 04/17/2016  . Non-seasonal allergic rhinitis 09/09/2018  . Osteoarthritis   . Palpitations 09/28/2013   Formatting of this note is different from the original. 01/29/15:  24 Hour Holter Monitor CONCLUSIONS: 1. Overall normal 24-hour Holter monitor recording, with rare, brief,  asymptomatic nonsustained  supraventricular tachycardia as described.  2. No cardiac rhythm explanation for patient's symptoms.     . Primary osteoarthritis of both knees 10/07/2016   Formatting of this note might be different from the original. 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  Marland Kitchen Primary osteoarthritis of right knee 11/21/2014  . S/P laparoscopic fundoplication 9/52/8413  . Scar condition and fibrosis of skin 04/17/2016   Formatting of this note might be different from the original. lower abdomen  . Sebaceous hyperplasia 04/17/2016  . Seborrheic keratoses 04/17/2016  . Skin tags, multiple acquired 04/17/2016  . Solar lentigo 04/17/2016  . Subarachnoid hemorrhage following injury 2010   Fall from horse; brief hospitalization    Past Surgical History:  Procedure Laterality Date  . ESOPHAGOGASTRODUODENOSCOPY     possibly last one was 2017. In [place]  . HERNIA REPAIR    . MEDIAL PARTIAL KNEE REPLACEMENT Right 12/14/2014  . NISSEN FUNDOPLICATION  24/40/1027  . OOPHORECTOMY    . PARTIAL KNEE ARTHROPLASTY Left 04/05/2014    There were no vitals filed for this visit.   Subjective Assessment - 10/22/20 1350    Subjective The patient had a flare up of neck pain.  She felt the corner stretch was aggravating her.  Her left shoulder is continuing to hurt.  She can have a couple of good days, then a couple of bad days.  The sharp pain in the back of the shoulder and down the arm ache. Pain at night is at it's worst with 8/10 pain at worst.  Patient Stated Goals Sleep comofortably and use the arm without increasing pain    Currently in Pain? Yes    Pain Score 4     Pain Location Shoulder    Pain Descriptors / Indicators Aching    Pain Type Chronic pain    Pain Onset More than a month ago    Pain Frequency Intermittent    Aggravating Factors  reaching behind her    Pain Relieving Factors ice and ionto helps              [facility] PT Assessment - 10/22/20 1355      Assessment    Medical Diagnosis Chronic L shoulder pain    Referring Provider (PT) [name], DO    Onset Date/Surgical Date 09/13/20    Hand Dominance Left                         [facility] Adult PT Treatment/Exercise - 10/22/20 1356      Exercises   Exercises Shoulder;Neck      Neck Exercises: Standing   Neck Retraction 10 reps    Neck Retraction Limitations standing with ball between shoulder blades      Shoulder Exercises: Supine   Flexion PROM;Left;10 reps    Flexion Limitations with pin and stretch       Shoulder Exercises: Prone   Retraction Strengthening;Both;10 reps    Extension Strengthening;Left;10 reps      Shoulder Exercises: Sidelying   External Rotation Strengthening;Left;10 reps      Shoulder Exercises: Standing   Retraction Strengthening;Both;10 reps    Retraction Limitations standing with ball for cues      Modalities   Modalities Iontophoresis      Iontophoresis   Type of Iontophoresis Dexamethasone    Location L bicipital groove    Dose 1.0 cc    Time  4 hr patch      Manual Therapy   Manual Therapy Soft tissue mobilization;Joint mobilization;Scapular mobilization;Manual Traction    Manual therapy comments to reduce pain and improve mobility    Joint Mobilization supine PA mid c-spine, prone upper to mid t-spine CPA grade II    Soft tissue mobilization bicipital groove STM and STM middle deltoids *taught patient self mob with ball against wall    Scapular Mobilization R sidelying with scapular mobility    Manual Traction cervical manual traction in supine                       PT Long Term Goals - 10/01/20 2031      PT LONG TERM GOAL #1   Title The patient will be indep with HEP.    Time 6    Period Weeks    Target Date 11/12/20      PT LONG TERM GOAL #2   Title The patient will reduce FOTO from 47% to 37% limitation.    Time 6    Period Weeks    Target Date 11/12/20      PT LONG TERM GOAL #3   Title The patient  will report reduced pain at night to < or equal to 3/10.    Time 6    Period Weeks    Target Date 11/12/20      PT LONG TERM GOAL #4   Title The patient will perform shoulder flexion and abduction without pain.    Time 6    Period Weeks    Target Date  11/12/20                 Plan - 10/22/20 1633    Clinical Impression Statement The patient has good and bad days and is still experiencing grabbing, catching episodes of pain worse with shoulder abduction.  She has significant soft tissue tightness anterior left shoulder in pec and bicipital groove.  She also has pain/tenderness in thoracic spine. Patient to reach out to Dr. Dianah Field for appointment.    Rehab Potential --    PT Frequency 2x / week    PT Duration 6 weeks    PT Treatment/Interventions ADLs/Self Care Home Management;Taping;Dry needling;Manual techniques;Therapeutic activities;Therapeutic exercise;Electrical Stimulation;Moist Heat;Cryotherapy;Patient/family education;Passive range of motion;Iontophoresis 4mg /ml Dexamethasone    PT Next Visit Plan iontophoresis anterior L shoulder;  isometrics, A/AROM, scapualr + postural strengthening, STM as needed.  *Pt to call to schedule with Dr. Darene Lamer if not improving by next 2 visits (6th visit)    PT Home Exercise Plan Access Code: [email or web address]    Consulted and Agree with Plan of Care Patient           Patient will benefit from skilled therapeutic intervention in order to improve the following deficits and impairments:     Visit Diagnosis: Chronic left shoulder pain  Abnormal posture  Other symptoms and signs involving the musculoskeletal system  Chronic pain of left knee     Problem List Patient Active Problem List   Diagnosis Date Noted  . Fibromyalgia 03/05/2020  . Knee pain, acute 03/05/2020  . Osteoarthritis 03/05/2020  . Diverticulitis of sigmoid colon 08/01/2019  . Generalized anxiety disorder with panic attacks 08/01/2019  . Unspecified atrial fibrillation  06/18/2019  . Major depressive disorder 09/09/2018  . Dry eye syndrome of both eyes 09/09/2018  . Microscopic hematuria 09/09/2018  . Non-seasonal allergic rhinitis 09/09/2018  . Hyperlipidemia 09/09/2018  . Essential hypertension 09/09/2018  . History of palpitations 09/09/2018  . Chronic bilateral low back pain without sciatica 10/07/2016  . Chronic bilateral upper abdominal pain 10/07/2016  . Primary osteoarthritis of both knees 10/07/2016  . Candidosis of skin 04/17/2016  . Cherry angioma 04/17/2016  . Dry skin 04/17/2016  . Dermal nevus of left forearm 04/17/2016  . Multiple benign nevi 04/17/2016  . Scar condition and fibrosis of skin 04/17/2016  . Sebaceous hyperplasia 04/17/2016  . Seborrheic keratoses 04/17/2016  . Skin tags, multiple acquired 04/17/2016  . Solar lentigo 04/17/2016  . S/P laparoscopic fundoplication 35/59/7416  . Gastroesophageal reflux disease without esophagitis 11/06/2015  . 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  . Primary osteoarthritis of right knee 11/21/2014  . 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 Palpitations 09/28/2013  . BMI 30.0-30.9,adult 01/27/2013  . Subarachnoid hemorrhage following injury 2010    Addy, PT 10/22/2020, 4:37 PM  [facility] [place], [place], [address] Phone: [phone]   Fax:  [phone]  Name: Melissa Rodgers MRN: [id number] Date of Birth: 07-04-1951

## 2020-10-30 ENCOUNTER — Encounter: Payer: Self-pay | Admitting: Rehabilitative and Restorative Service Providers"

## 2020-10-30 ENCOUNTER — Other Ambulatory Visit: Payer: Self-pay

## 2020-10-30 ENCOUNTER — Ambulatory Visit (INDEPENDENT_AMBULATORY_CARE_PROVIDER_SITE_OTHER): Payer: Medicare Other | Admitting: Rehabilitative and Restorative Service Providers"

## 2020-10-30 DIAGNOSIS — G8929 Other chronic pain: Secondary | ICD-10-CM

## 2020-10-30 DIAGNOSIS — R293 Abnormal posture: Secondary | ICD-10-CM | POA: Diagnosis not present

## 2020-10-30 DIAGNOSIS — R29898 Other symptoms and signs involving the musculoskeletal system: Secondary | ICD-10-CM | POA: Diagnosis not present

## 2020-10-30 DIAGNOSIS — M25512 Pain in left shoulder: Secondary | ICD-10-CM | POA: Diagnosis not present

## 2020-10-30 NOTE — Patient Instructions (Addendum)
Access Code: [email or web address] URL: [email or web address] Date: 10/30/2020 Prepared by: Rudell Cobb  Exercises Supine Chin Tuck - 2 x daily - 7 x weekly - 1 sets - 10 reps - 3-5 seconds hold Towel Roll Stretch - 2 x daily - 7 x weekly - 1 sets - 1 reps - 2-3 minutes hold Supine Thoracic Mobilization Towel Roll Vertical - 2 x daily - 7 x weekly - 1 sets - 1 reps - 2 minutes hold Supine Elbow Flexion Extension AROM - 2 x daily - 7 x weekly - 1 sets - 10 reps Supine Shoulder External Rotation AAROM with Dowel - 2 x daily - 7 x weekly - 1 sets - 10 reps Supine Shoulder Press AAROM in Abduction with Dowel - 2 x daily - 7 x weekly - 1 sets - 10 reps Seated Shoulder Circles - 2 x daily - 7 x weekly - 1 sets - 3 reps Standing Anatomical Position with Scapular Retraction and Depression at Wall - 2 x daily - 7 x weekly - 1 sets - 10 reps Circular Shoulder Pendulum with Table Support - 2 x daily - 7 x weekly - 1 sets - 10 reps

## 2020-10-30 NOTE — Therapy (Signed)
[place], [place], [address] Phone: [phone]   Fax:  [phone]  Physical Therapy Treatment  Patient Details  Name: Melissa Rodgers MRN: [id number] Date of Birth: 05/08/1951 Referring Provider (PT): [name], DO   Encounter Date: 10/30/2020   PT End of Session - 10/30/20 1018    Visit Number 6    Number of Visits 12    Date for PT Re-Evaluation 11/12/20    Authorization Type medicare    PT Start Time 0935    PT Stop Time 1022    PT Time Calculation (min) 47 min    Activity Tolerance Patient limited by pain    Behavior During Therapy [facility] for tasks assessed/performed           Past Medical History:  Diagnosis Date  . Atrial fibrillation 06/18/2019  . BMI 30.0-30.9,adult 01/27/2013  . Candidosis of skin 04/17/2016   Formatting of this note might be different from the original. lower abdomen  . Cherry angioma 04/17/2016  . Chronic bilateral low back pain without sciatica 10/07/2016  . 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  Marland Kitchen Dermal nevus of left forearm 04/17/2016  . Diverticulitis of sigmoid colon 08/01/2019  . Dry eye syndrome of both eyes 09/09/2018  . Dry skin 04/17/2016  . Essential hypertension 09/09/2018  . Fibromyalgia   . Gastroesophageal reflux disease without esophagitis 11/06/2015  . Generalized anxiety disorder with panic attacks 08/01/2019  . History of palpitations 09/09/2018  . Hyperlipidemia 09/09/2018  . 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  . Knee pain, acute 03/05/2020  . 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 Major depressive disorder 09/09/2018  . Microscopic hematuria 09/09/2018  . Multiple benign nevi 04/17/2016  . Non-seasonal allergic rhinitis 09/09/2018  . Osteoarthritis   . Palpitations 09/28/2013   Formatting of this note is different from the original. 01/29/15:  24 Hour Holter Monitor  CONCLUSIONS: 1. Overall normal 24-hour Holter monitor recording, with rare, brief,  asymptomatic nonsustained supraventricular tachycardia as described.  2. No cardiac rhythm explanation for patient's symptoms.     . Primary osteoarthritis of both knees 10/07/2016   Formatting of this note might be different from the original. 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  Marland Kitchen Primary osteoarthritis of right knee 11/21/2014  . S/P laparoscopic fundoplication 3/78/5885  . Scar condition and fibrosis of skin 04/17/2016   Formatting of this note might be different from the original. lower abdomen  . Sebaceous hyperplasia 04/17/2016  . Seborrheic keratoses 04/17/2016  . Skin tags, multiple acquired 04/17/2016  . Solar lentigo 04/17/2016  . Subarachnoid hemorrhage following injury 2010   Fall from horse; brief hospitalization    Past Surgical History:  Procedure Laterality Date  . ESOPHAGOGASTRODUODENOSCOPY     possibly last one was 2017. In [place]  . HERNIA REPAIR    . MEDIAL PARTIAL KNEE REPLACEMENT Right 12/14/2014  . NISSEN FUNDOPLICATION  02/77/4128  . OOPHORECTOMY    . PARTIAL KNEE ARTHROPLASTY Left 04/05/2014    There were no vitals filed for this visit.   Subjective Assessment - 10/30/20 0938    Subjective The patient is noting pain was worse in the anterior shoulder.  She is also reporting R shoulder pain.    Patient Stated Goals Sleep comofortably and use the arm without increasing pain    Currently in Pain? Yes    Pain Score 3  Pain at night and in morning is between 6-8/10   Pain Location Shoulder    Pain Orientation Left;Anterior    Pain Descriptors / Indicators Aching;Sharp;Shooting    Pain Onset More than a month ago    Pain Frequency Intermittent    Aggravating Factors  certain movements    Pain Relieving Factors ice, ionto                             [facility] Adult PT Treatment/Exercise - 10/30/20 0001      Exercises    Exercises Shoulder;Neck      Neck Exercises: Supine   Neck Retraction 5 reps      Shoulder Exercises: Supine   External Rotation AAROM;Left;10 reps    Flexion PROM;Left;5 reps    Flexion Limitations with parascapular  STM and stabiization for stretch    ABduction PROM;Left    ABduction Limitations 2 reps-- notes catching sensation in shoulder/lateral arm    Other Supine Exercises Supine elbow AROM and strengthening 1 lb x 10 reps    Other Supine Exercises Supine shoulder press x AAROM x 10 reps      Shoulder Exercises: Stretch   Other Shoulder Stretches supine pec stretch with towel roll      Modalities   Modalities Iontophoresis      Iontophoresis   Type of Iontophoresis Dexamethasone    Location L bicipital groove    Dose 1.0 cc    Time 8 hour patch      Manual Therapy   Manual Therapy Soft tissue mobilization;Manual Traction    Manual therapy comments to reduce pain    Soft tissue mobilization bicipital groove STM and IASTM, STM upper trap and paraspinal cervical musculature    Manual Traction supine cervical manual traction with passive flexion for stretch                  PT Education - 10/30/20 1018    Education Details updated HEP    Person(s) Educated Patient    Methods Explanation;Demonstration;Handout    Comprehension Verbalized understanding;Returned demonstration               PT Long Term Goals - 10/01/20 2031      PT LONG TERM GOAL #1   Title The patient will be indep with HEP.    Time 6    Period Weeks    Target Date 11/12/20      PT LONG TERM GOAL #2   Title The patient will reduce FOTO from 47% to 37% limitation.    Time 6    Period Weeks    Target Date 11/12/20      PT LONG TERM GOAL #3   Title The patient will report reduced pain at night to < or equal to 3/10.    Time 6    Period Weeks    Target Date 11/12/20      PT LONG TERM GOAL #4   Title The patient will perform shoulder flexion and abduction without pain.    Time 6     Period Weeks    Target Date 11/12/20                 Plan - 10/30/20 1037    Clinical Impression Statement The patient is continuing with a catching sensation in the L anterior shoulder and parascapular region.  She is scheduled to f/u with Dr. Dianah Field next week for further assessment.  PT added cervical stabilization and stretching + elbow resisted flexion to continue within tolerable range of motion for ther ex.    PT Frequency 2x / week    PT Duration 6 weeks    PT Treatment/Interventions ADLs/Self Care Home Management;Taping;Dry needling;Manual techniques;Therapeutic activities;Therapeutic exercise;Electrical Stimulation;Moist Heat;Cryotherapy;Patient/family education;Passive range of motion;Iontophoresis 4mg /ml Dexamethasone    PT Next Visit Plan iontophoresis anterior L shoulder;  isometrics, A/AROM, scapualr + postural strengthening, STM as needed.  *Pt to call to schedule with Dr. Darene Lamer if not improving by next 2 visits (6th visit)    PT Home Exercise Plan Access Code: [email or web address]    Consulted and Agree with Plan of Care Patient           Patient will benefit from skilled therapeutic intervention in order to improve the following deficits and impairments:     Visit Diagnosis: Chronic left shoulder pain  Abnormal posture  Other symptoms and signs involving the musculoskeletal system     Problem List Patient Active Problem List   Diagnosis Date Noted  . Fibromyalgia 03/05/2020  . Knee pain, acute 03/05/2020  . Osteoarthritis 03/05/2020  . Diverticulitis of sigmoid colon 08/01/2019  . Generalized anxiety disorder with panic attacks 08/01/2019  . Unspecified atrial fibrillation 06/18/2019  . Major depressive disorder 09/09/2018  . Dry eye syndrome of both eyes 09/09/2018  . Microscopic hematuria 09/09/2018  . Non-seasonal allergic rhinitis 09/09/2018  . Hyperlipidemia 09/09/2018  . Essential hypertension 09/09/2018  . History of palpitations 09/09/2018  .  Chronic bilateral low back pain without sciatica 10/07/2016  . Chronic bilateral upper abdominal pain 10/07/2016  . Primary osteoarthritis of both knees 10/07/2016  . Candidosis of skin 04/17/2016  . Cherry angioma 04/17/2016  . Dry skin 04/17/2016  . Dermal nevus of left forearm 04/17/2016  . Multiple benign nevi 04/17/2016  . Scar condition and fibrosis of skin 04/17/2016  . Sebaceous hyperplasia 04/17/2016  . Seborrheic keratoses 04/17/2016  . Skin tags, multiple acquired 04/17/2016  . Solar lentigo 04/17/2016  . S/P laparoscopic fundoplication 26/20/3559  . Gastroesophageal reflux disease without esophagitis 11/06/2015  . 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  . Primary osteoarthritis of right knee 11/21/2014  . 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 Palpitations 09/28/2013  . BMI 30.0-30.9,adult 01/27/2013  . Subarachnoid hemorrhage following injury 2010    Howard, PT 10/30/2020, 10:39 AM  [facility] [place], [place], [address] Phone: [phone]   Fax:  [phone]  Name: Melissa Rodgers MRN: [id number] Date of Birth: 1951/02/20

## 2020-11-05 ENCOUNTER — Ambulatory Visit (INDEPENDENT_AMBULATORY_CARE_PROVIDER_SITE_OTHER): Payer: Medicare Other | Admitting: Sports Medicine

## 2020-11-05 ENCOUNTER — Ambulatory Visit (INDEPENDENT_AMBULATORY_CARE_PROVIDER_SITE_OTHER): Payer: Medicare Other

## 2020-11-05 ENCOUNTER — Other Ambulatory Visit: Payer: Self-pay

## 2020-11-05 DIAGNOSIS — M25462 Effusion, left knee: Secondary | ICD-10-CM | POA: Diagnosis not present

## 2020-11-05 DIAGNOSIS — M25512 Pain in left shoulder: Secondary | ICD-10-CM

## 2020-11-05 DIAGNOSIS — M1711 Unilateral primary osteoarthritis, right knee: Secondary | ICD-10-CM | POA: Diagnosis not present

## 2020-11-05 DIAGNOSIS — M170 Bilateral primary osteoarthritis of knee: Secondary | ICD-10-CM

## 2020-11-05 DIAGNOSIS — M1712 Unilateral primary osteoarthritis, left knee: Secondary | ICD-10-CM | POA: Diagnosis not present

## 2020-11-05 DIAGNOSIS — M25461 Effusion, right knee: Secondary | ICD-10-CM

## 2020-11-05 MED ORDER — TRAMADOL HCL 50 MG PO TABS
50.0000 mg | ORAL_TABLET | Freq: Three times a day (TID) | ORAL | 0 refills | Status: DC | PRN
Start: 2020-11-05 — End: 2020-12-26

## 2020-11-05 NOTE — Progress Notes (Signed)
    Procedures performed today:    Procedure: Real-time Ultrasound Guided injection of the left subacromial bursa Device: Samsung HS60  Verbal informed consent obtained.  Time-out conducted.  Noted no overlying erythema, induration, or other signs of local infection.  Skin prepped in a sterile fashion.  Local anesthesia: Topical Ethyl chloride.  With sterile technique and under real time ultrasound guidance: 1 cc Kenalog 40, 1 cc lidocaine, 1 cc bupivacaine injected easily Completed without difficulty  Advised to call if fevers/chills, erythema, induration, drainage, or persistent bleeding.  Images permanently stored and available for review in PACS.  Impression: Technically successful ultrasound guided injection.  Independent interpretation of notes and tests performed by another provider:   None.  Brief History, Exam, Impression, and Recommendations:    Severe pain of left shoulder Severe pain, positive impingement signs. I do suspect she has rotator cuff disease, she has already done greater than 6 weeks of physical therapy, x-rays unrevealing. Subacromial injection as above, return to see me in approximately 4 to 6 weeks. MRI if no better.  Primary osteoarthritis of both knees History of bilateral partial knee replacements, bone scan several years ago did show potential loosening of the arthroplasty components on the left, lesser so on the right. She did have a discussion with orthopedic surgery who did plan conversion to bilateral total knee replacement. We will start conservatively, bilateral x-rays, tramadol, and hold off as long as possible from converting this to totals.    ___________________________________________ [name], M.D., ABFM., CAQSM. Primary Care and [place] Instructor of [place] [employer] [facility] of Medicine

## 2020-11-05 NOTE — Assessment & Plan Note (Signed)
Severe pain, positive impingement signs. I do suspect she has rotator cuff disease, she has already done greater than 6 weeks of physical therapy, x-rays unrevealing. Subacromial injection as above, return to see me in approximately 4 to 6 weeks. MRI if no better.

## 2020-11-05 NOTE — Progress Notes (Deleted)
Cardiology Office Note:    Date:  11/05/2020   ID:  Melissa Rodgers, DOB 03-31-1951, MRN [id number]  PCP:  [name], DO  Cardiologist:  Shirlee More, MD    Referring MD: [name], DO    ASSESSMENT:    No diagnosis found. PLAN:    In order of problems listed above:  1. ***   Next appointment: ***   Medication Adjustments/Labs and Tests Ordered: Current medicines are reviewed at length with the patient today.  Concerns regarding medicines are outlined above.  No orders of the defined types were placed in this encounter.  No orders of the defined types were placed in this encounter.   No chief complaint on file.   History of Present Illness:    [name] is a 69 y.o. female with a hx of  paroxysmal atrial fibrillation maintaining sinus rhythm on flecainide and chronically anticoagulated  last seen 09/11/2020. Compliance with diet, lifestyle and medications: *** Past Medical History:  Diagnosis Date  . Atrial fibrillation 06/18/2019  . BMI 30.0-30.9,adult 01/27/2013  . Candidosis of skin 04/17/2016   Formatting of this note might be different from the original. lower abdomen  . Cherry angioma 04/17/2016  . Chronic bilateral low back pain without sciatica 10/07/2016  . 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  Marland Kitchen Dermal nevus of left forearm 04/17/2016  . Diverticulitis of sigmoid colon 08/01/2019  . Dry eye syndrome of both eyes 09/09/2018  . Dry skin 04/17/2016  . Essential hypertension 09/09/2018  . Fibromyalgia   . Gastroesophageal reflux disease without esophagitis 11/06/2015  . Generalized anxiety disorder with panic attacks 08/01/2019  . History of palpitations 09/09/2018  . Hyperlipidemia 09/09/2018  . 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  . Knee pain, acute 03/05/2020  . 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 Major depressive disorder 09/09/2018  . Microscopic hematuria  09/09/2018  . Multiple benign nevi 04/17/2016  . Non-seasonal allergic rhinitis 09/09/2018  . Osteoarthritis   . Palpitations 09/28/2013   Formatting of this note is different from the original. 01/29/15:  24 Hour Holter Monitor CONCLUSIONS: 1. Overall normal 24-hour Holter monitor recording, with rare, brief,  asymptomatic nonsustained supraventricular tachycardia as described.  2. No cardiac rhythm explanation for patient's symptoms.     . Primary osteoarthritis of both knees 10/07/2016   Formatting of this note might be different from the original. 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  Marland Kitchen Primary osteoarthritis of right knee 11/21/2014  . S/P laparoscopic fundoplication 5/62/5638  . Scar condition and fibrosis of skin 04/17/2016   Formatting of this note might be different from the original. lower abdomen  . Sebaceous hyperplasia 04/17/2016  . Seborrheic keratoses 04/17/2016  . Skin tags, multiple acquired 04/17/2016  . Solar lentigo 04/17/2016  . Subarachnoid hemorrhage following injury 2010   Fall from horse; brief hospitalization    Past Surgical History:  Procedure Laterality Date  . ESOPHAGOGASTRODUODENOSCOPY     possibly last one was 2017. In [place]  . HERNIA REPAIR    . MEDIAL PARTIAL KNEE REPLACEMENT Right 12/14/2014  . NISSEN FUNDOPLICATION  93/73/4287  . OOPHORECTOMY    . PARTIAL KNEE ARTHROPLASTY Left 04/05/2014    Current Medications: No outpatient medications have been marked as taking for the 11/06/20 encounter (Appointment) with Richardo Priest, MD.     Allergies:   Rosuvastatin and Sulfa antibiotics   Social History   Socioeconomic History  .  Marital status: Married    Spouse name: Not on file  . Number of children: 0  . Years of education: 17  . Highest education level: Master's degree (e.g., MA, MS, MEng, MEd, MSW, MBA)  Occupational History  . Occupation: Retired  Tobacco Use  . Smoking status: Former Smoker    Types:  Cigarettes    Quit date: 08/18/1983    Years since quitting: 37.2  . Smokeless tobacco: Never Used  Vaping Use  . Vaping Use: Never used  Substance and Sexual Activity  . Alcohol use: Yes    Alcohol/week: 7.0 - 14.0 standard drinks    Types: 7 - 14 Glasses of wine per week    Comment: 1-2 drinks per night, usually wine  . Drug use: Never  . Sexual activity: Yes    Partners: Male    Birth control/protection: None  Other Topics Concern  . Not on file  Social History Narrative  . Not on file   Social Determinants of Health   Financial Resource Strain: Not on file  Food Insecurity: Not on file  Transportation Needs: Not on file  Physical Activity: Not on file  Stress: Not on file  Social Connections: Not on file     Family History: The patient's ***family history includes Alzheimer's disease in her father; Atrial fibrillation in her sister; Diabetes in her mother; Heart disease in her mother; Tremor in an other family member. There is no history of Colon cancer or Esophageal cancer. ROS:   Please see the history of present illness.    All other systems reviewed and are negative.  EKGs/Labs/Other Studies Reviewed:    The following studies were reviewed today:  EKG:  EKG ordered today and personally reviewed.  The ekg ordered today demonstrates ***  Recent Labs: 09/13/2020: ALT 19; BUN 14; Creat 0.63; Hemoglobin 14.1; Platelets 278; Potassium 4.2; Sodium 139; TSH 2.78  Recent Lipid Panel    Component Value Date/Time   CHOL 220 (H) 02/22/2020 0920   TRIG 229 (H) 02/22/2020 0920   HDL 48 (L) 02/22/2020 0920   CHOLHDL 4.6 02/22/2020 0920   LDLCALC 135 (H) 02/22/2020 0920    Physical Exam:    VS:  There were no vitals taken for this visit.    Wt Readings from Last 3 Encounters:  09/13/20 184 lb 0.6 oz (83.5 kg)  09/11/20 185 lb 1.3 oz (84 kg)  07/17/20 182 lb 8 oz (82.8 kg)     GEN: *** Well nourished, well developed in no acute distress HEENT: Normal NECK: No  JVD; No carotid bruits LYMPHATICS: No lymphadenopathy CARDIAC: ***RRR, no murmurs, rubs, gallops RESPIRATORY:  Clear to auscultation without rales, wheezing or rhonchi  ABDOMEN: Soft, non-tender, non-distended MUSCULOSKELETAL:  No edema; No deformity  SKIN: Warm and dry NEUROLOGIC:  Alert and oriented x 3 PSYCHIATRIC:  Normal affect    Signed, Shirlee More, MD  11/05/2020 4:51 PM    [facility]

## 2020-11-05 NOTE — Assessment & Plan Note (Signed)
History of bilateral partial knee replacements, bone scan several years ago did show potential loosening of the arthroplasty components on the left, lesser so on the right. She did have a discussion with orthopedic surgery who did plan conversion to bilateral total knee replacement. We will start conservatively, bilateral x-rays, tramadol, and hold off as long as possible from converting this to totals.

## 2020-11-06 ENCOUNTER — Ambulatory Visit: Payer: Medicare Other | Admitting: Cardiology

## 2020-11-07 ENCOUNTER — Encounter: Payer: Medicare Other | Admitting: Rehabilitative and Restorative Service Providers"

## 2020-11-08 IMAGING — NM NM BONE 3 PHASE
9 series · 19 of 19 positions shown · non-contrast
Comparison: None.

CLINICAL DATA: Right knee replacement in 8951 and left knee
replacement in 5188. Pain in both knees, left worse than right.

EXAM:
NUCLEAR MEDICINE 3-PHASE BONE SCAN
TECHNIQUE: Radionuclide angiographic images, immediate static blood pool
images, and 3-hour delayed static images were obtained of the knees
after intravenous injection of radiopharmaceutical.
RADIOPHARMACEUTICALS:  Twenty mCi Oc-IIm MDP IV

[Series 1: flow · 2.07mm/px · 6 of 48 frames shown (1 of 2)]
[frame 5/48  full-range]
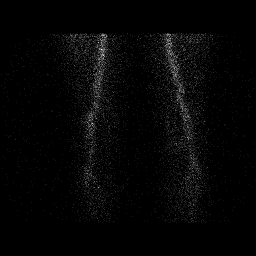
[frame 13/48  full-range]
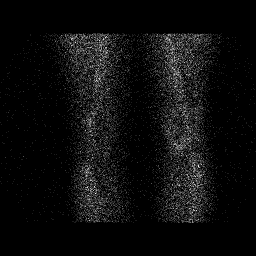
[frame 21/48  full-range]
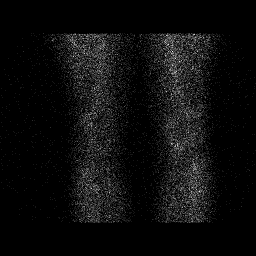
[frame 29/48  full-range]
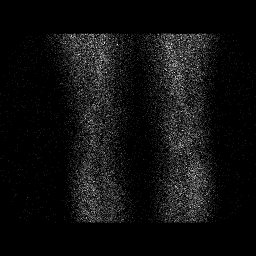
[frame 37/48  full-range]
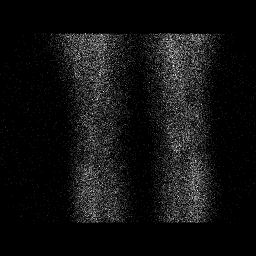
[frame 45/48  full-range]
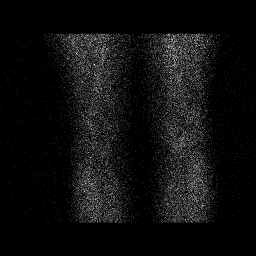

[Series 1: flow · 2.07mm/px · 6 of 48 frames shown (2 of 2)]
[frame 5/48]
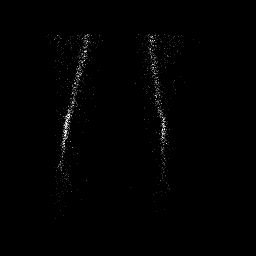
[frame 13/48  full-range]
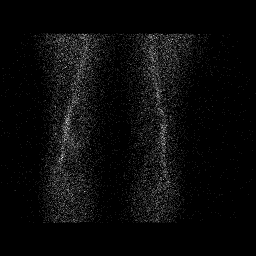
[frame 21/48  full-range]
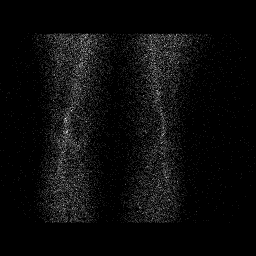
[frame 29/48  full-range]
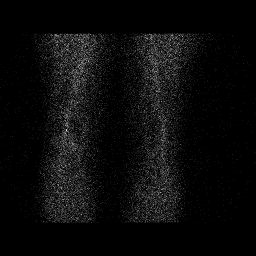
[frame 37/48  full-range]
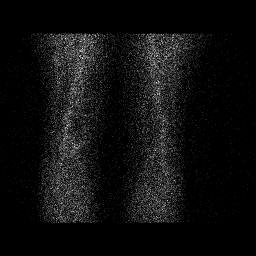
[frame 45/48  full-range]
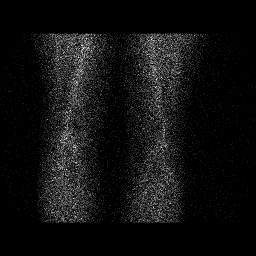

[Series 2: blood pool · 2.07mm/px · 1 of 1 slices shown (1 of 2)]
[im 1/1]
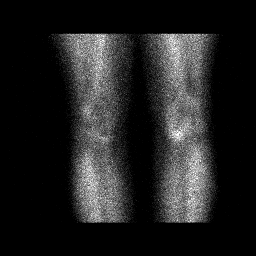

[Series 2: blood pool · 2.07mm/px · 1 of 1 slices shown (2 of 2)]
[im 1/1]
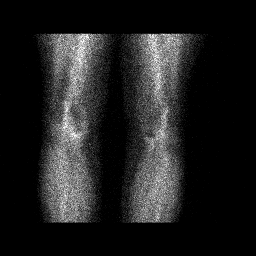

[Series 3: lat bp · 2.07mm/px · 1 of 1 slices shown (1 of 2)]
[im 1/1]
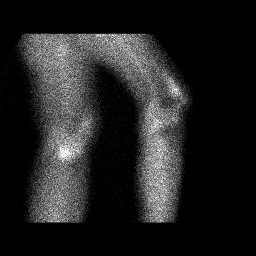

[Series 3: lat bp · 2.07mm/px · 1 of 1 slices shown (2 of 2)]
[im 1/1]
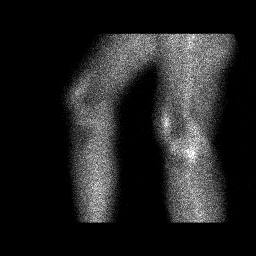

[Series 4: delay · delayed · 2.07mm/px · 1 of 1 slices shown (1 of 3)]
[im 1/1]
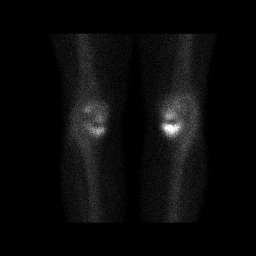

[Series 5: delay · delayed · 2.07mm/px · 1 of 1 slices shown (2 of 3)]
[im 1/1]
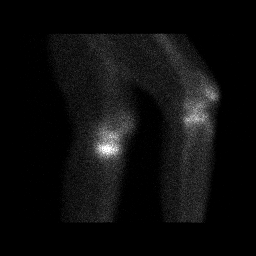

[Series 5: delay · delayed · 2.07mm/px · 1 of 1 slices shown (3 of 3)]
[im 1/1]
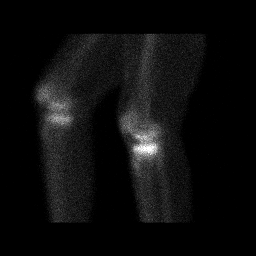

[19 of 19 positions shown; findings below may reference images not displayed]

FINDINGS: Vascular phase: No abnormalities are identified.

Blood pool phase: There is increased blood pool uptake in the medial
aspects of both knees, left greater than right.

Delayed phase: There is increased uptake in the medial left knee,
greater at the site of the tibial component than the femoral
component. There is increased uptake in the medial right knee as
well but much less than seen on the left and also greater at the
site of the medial component.
IMPRESSION: 1. There is increased blood pool and delayed uptake in the medial
compartment of the left knee, most prominent adjacent to the tibial
component of the partial left knee replacement. Given the asymmetry
of uptake in this region, the findings are concerning for loosening
of at least the tibial component.
2. Very mild blood pool uptake and moderate delayed uptake in the
medial compartment of the right knee, also greatest adjacent to the
tibial component. The amount of uptake on the right is less specific
but mild loosening is not excluded, particularly adjacent to the
tibial component.

## 2020-11-22 ENCOUNTER — Ambulatory Visit (INDEPENDENT_AMBULATORY_CARE_PROVIDER_SITE_OTHER): Payer: Medicare Other | Admitting: Rehabilitative and Restorative Service Providers"

## 2020-11-22 ENCOUNTER — Other Ambulatory Visit: Payer: Self-pay

## 2020-11-22 DIAGNOSIS — M25512 Pain in left shoulder: Secondary | ICD-10-CM | POA: Diagnosis not present

## 2020-11-22 DIAGNOSIS — M25562 Pain in left knee: Secondary | ICD-10-CM

## 2020-11-22 DIAGNOSIS — R29898 Other symptoms and signs involving the musculoskeletal system: Secondary | ICD-10-CM | POA: Diagnosis not present

## 2020-11-22 DIAGNOSIS — R293 Abnormal posture: Secondary | ICD-10-CM

## 2020-11-22 DIAGNOSIS — G8929 Other chronic pain: Secondary | ICD-10-CM

## 2020-11-22 NOTE — Patient Instructions (Signed)
Access Code: [email or web address] URL: [email or web address] Date: 11/22/2020 Prepared by: Margretta Ditty  Exercises Supine Chin Tuck - 2 x daily - 7 x weekly - 1 sets - 5 reps - 3-5 seconds hold Hooklying Shoulder T - 2 x daily - 7 x weekly - 1 sets - 5 reps Supine Shoulder Flexion Extension AAROM with Dowel - 2 x daily - 7 x weekly - 1 sets - 5 reps Seated Shoulder Circles - 2 x daily - 7 x weekly - 1 sets - 3 reps Seated Cervical Retraction and Rotation - 2 x daily - 7 x weekly - 1 sets - 3 reps Standing Anatomical Position with Scapular Retraction and Depression at Wall - 2 x daily - 7 x weekly - 1 sets - 10 reps Isometric Shoulder Abduction at Wall - 2 x daily - 7 x weekly - 1 sets - 5 reps - 3 seconds hold

## 2020-11-22 NOTE — Therapy (Signed)
[place], [place], [address] Phone: [phone]   Fax:  [phone]  Physical Therapy Treatment and Renewal Summary  Patient Details  Name: Melissa Rodgers MRN: [id number] Date of Birth: 24-Jan-1951 Referring Provider (PT): [name], DO   Encounter Date: 11/22/2020   PT End of Session - 11/22/20 0928    Visit Number 7    Number of Visits 12    Date for PT Re-Evaluation 01/06/21    Authorization Type medicare    PT Start Time 0848    PT Stop Time 0926    PT Time Calculation (min) 38 min    Activity Tolerance Patient limited by pain    Behavior During Therapy [facility] for tasks assessed/performed           Past Medical History:  Diagnosis Date  . Atrial fibrillation 06/18/2019  . BMI 30.0-30.9,adult 01/27/2013  . Candidosis of skin 04/17/2016   Formatting of this note might be different from the original. lower abdomen  . Cherry angioma 04/17/2016  . Chronic bilateral low back pain without sciatica 10/07/2016  . 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  Marland Kitchen Dermal nevus of left forearm 04/17/2016  . Diverticulitis of sigmoid colon 08/01/2019  . Dry eye syndrome of both eyes 09/09/2018  . Dry skin 04/17/2016  . Essential hypertension 09/09/2018  . Fibromyalgia   . Gastroesophageal reflux disease without esophagitis 11/06/2015  . Generalized anxiety disorder with panic attacks 08/01/2019  . History of palpitations 09/09/2018  . Hyperlipidemia 09/09/2018  . 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  . Knee pain, acute 03/05/2020  . 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 Major depressive disorder 09/09/2018  . Microscopic hematuria 09/09/2018  . Multiple benign nevi 04/17/2016  . Non-seasonal allergic rhinitis 09/09/2018  . Osteoarthritis   . Palpitations 09/28/2013   Formatting of this note is different from the original. 01/29/15:  24  Hour Holter Monitor CONCLUSIONS: 1. Overall normal 24-hour Holter monitor recording, with rare, brief,  asymptomatic nonsustained supraventricular tachycardia as described.  2. No cardiac rhythm explanation for patient's symptoms.     . Primary osteoarthritis of both knees 10/07/2016   Formatting of this note might be different from the original. 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  Marland Kitchen Primary osteoarthritis of right knee 11/21/2014  . S/P laparoscopic fundoplication 2/69/4854  . Scar condition and fibrosis of skin 04/17/2016   Formatting of this note might be different from the original. lower abdomen  . Sebaceous hyperplasia 04/17/2016  . Seborrheic keratoses 04/17/2016  . Skin tags, multiple acquired 04/17/2016  . Solar lentigo 04/17/2016  . Subarachnoid hemorrhage following injury 2010   Fall from horse; brief hospitalization    Past Surgical History:  Procedure Laterality Date  . ESOPHAGOGASTRODUODENOSCOPY     possibly last one was 2017. In [place]  . HERNIA REPAIR    . MEDIAL PARTIAL KNEE REPLACEMENT Right 12/14/2014  . NISSEN FUNDOPLICATION  62/70/3500  . OOPHORECTOMY    . PARTIAL KNEE ARTHROPLASTY Left 04/05/2014    There were no vitals filed for this visit.   Subjective Assessment - 11/22/20 0852    Subjective The patient notes that the injection was "somewhat of a miracle".  The surrounding pain in the L shoulder "melted away" after the injection.  She reports she returned to therapy to see what she should continue with ther ex.  She is continuing with neck  and R shoulder pain.    Patient Stated Goals Sleep comofortably and use the arm without increasing pain    Currently in Pain? Yes    Pain Score 1     Pain Location Shoulder    Pain Orientation Left    Multiple Pain Sites Yes    Pain Score 2    Pain Location Neck    Pain Orientation Right;Lower    Pain Descriptors / Indicators Tightness;Sore    Pain Type Chronic pain    Pain Onset  More than a month ago    Pain Frequency Intermittent    Aggravating Factors  worse after getting L side injected    Pain Relieving Factors certain movements              [facility] PT Assessment - 11/22/20 0925      Assessment   Medical Diagnosis Chronic L shoulder pain    Referring Provider (PT) [name], DO    Onset Date/Surgical Date 09/13/20    Hand Dominance Left      AROM   Left Shoulder Flexion 170 Degrees    Left Shoulder ABduction 110 Degrees   gets tightness, no pain                        [facility] Adult PT Treatment/Exercise - 11/22/20 0925      Self-Care   Self-Care Other Self-Care Comments    Other Self-Care Comments  discussed self mgmt of neck, bilat shoulder pain; patinet to work on ther ex and return if difficulties or need for progression      Exercises   Exercises Shoulder;Neck      Neck Exercises: Supine   Cervical Isometrics Right lateral flexion;Left lateral flexion;3 secs   3 reps   Neck Retraction 5 reps    Neck Retraction Limitations supine    Cervical Rotation Right;Left;5 reps   with retraction     Shoulder Exercises: Supine   Horizontal ABduction AROM;Both;5 reps    Flexion AAROM;Both;5 reps    Flexion Limitations within tolerable ROM      Shoulder Exercises: Standing   Retraction Strengthening;Both;10 reps      Shoulder Exercises: Isometric Strengthening   ABduction 3X5"    ABduction Limitations standing near wall      Shoulder Exercises: Stretch   Other Shoulder Stretches shoullder circles                  PT Education - 11/22/20 0915    Education Details modified HEP    Person(s) Educated Patient    Methods Explanation;Demonstration;Handout    Comprehension Verbalized understanding;Returned demonstration               PT Long Term Goals - 11/22/20 0916      PT LONG TERM GOAL #1   Title The patient will be indep with HEP.    Time 6    Period Weeks    Status Achieved      PT LONG TERM GOAL  #2   Title The patient will reduce FOTO from 47% to 37% limitation.    Baseline 47 to 44% improvement.    Time 6    Period Weeks    Status Partially Met      PT LONG TERM GOAL #3   Title The patient will report reduced pain at night to < or equal to 3/10.    Baseline After injection, down to 2/10.    Time 6  Period Weeks    Status Achieved      PT LONG TERM GOAL #4   Title The patient will perform shoulder flexion and abduction without pain.    Baseline Since injection, can tolerate A/ROM    Time 6    Period Weeks    Status Achieved            UPDATED LONG TERM GOALS:    PT Long Term Goals - 11/22/20 0930      PT LONG TERM GOAL #1   Title The patient will be indep with HEP.    Time 6    Period Weeks    Status Revised    Target Date 01/03/21      PT LONG TERM GOAL #2   Title The patient will reduce FOTO from 47% to 37% limitation.    Time 6    Period Weeks    Status Revised    Target Date 01/03/21              Plan - 11/22/20 6222    Clinical Impression Statement The patient has partially met LTGs.  She improved most from recent injection and returned to clinic for updated ther ex program.  We discussed focusing on neck ROM and stability, isometric strengthening shoulders within tolerance, and maintaining shoulder ROM.  Patient tolerated low reps of HEP modification and plans to work on and return to clinic if needed.    PT Frequency 1x / week    PT Duration 6 weeks    PT Treatment/Interventions ADLs/Self Care Home Management;Taping;Dry needling;Manual techniques;Therapeutic activities;Therapeutic exercise;Electrical Stimulation;Moist Heat;Cryotherapy;Patient/family education;Passive range of motion;Iontophoresis 67m/ml Dexamethasone    PT Next Visit Plan iontophoresis anterior L shoulder (AVOID-- had skin reaction);  isometrics, A/AROM, scapualr + postural strengthening, STM as needed.    PT Home Exercise Plan Access Code: [email or web address]   Consulted and Agree  with Plan of Care Patient           Patient will benefit from skilled therapeutic intervention in order to improve the following deficits and impairments:  Pain,Increased fascial restricitons,Decreased range of motion,Decreased strength,Postural dysfunction,Impaired flexibility,Hypomobility,Impaired UE functional use  Visit Diagnosis: Chronic left shoulder pain  Abnormal posture  Other symptoms and signs involving the musculoskeletal system  Chronic pain of left knee     Problem List Patient Active Problem List   Diagnosis Date Noted  . Severe pain of left shoulder 11/05/2020  . Fibromyalgia 03/05/2020  . Knee pain, acute 03/05/2020  . Osteoarthritis 03/05/2020  . Diverticulitis of sigmoid colon 08/01/2019  . Generalized anxiety disorder with panic attacks 08/01/2019  . Unspecified atrial fibrillation 06/18/2019  . Major depressive disorder 09/09/2018  . Dry eye syndrome of both eyes 09/09/2018  . Microscopic hematuria 09/09/2018  . Non-seasonal allergic rhinitis 09/09/2018  . Hyperlipidemia 09/09/2018  . Essential hypertension 09/09/2018  . History of palpitations 09/09/2018  . Chronic bilateral low back pain without sciatica 10/07/2016  . Chronic bilateral upper abdominal pain 10/07/2016  . Primary osteoarthritis of both knees 10/07/2016  . Candidosis of skin 04/17/2016  . Cherry angioma 04/17/2016  . Dry skin 04/17/2016  . Dermal nevus of left forearm 04/17/2016  . Multiple benign nevi 04/17/2016  . Scar condition and fibrosis of skin 04/17/2016  . Sebaceous hyperplasia 04/17/2016  . Seborrheic keratoses 04/17/2016  . Skin tags, multiple acquired 04/17/2016  . Solar lentigo 04/17/2016  . S/P laparoscopic fundoplication 094/17/4081 . Gastroesophageal reflux disease without esophagitis 11/06/2015  . 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  .  Primary osteoarthritis of right knee 11/21/2014  . 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 Palpitations 09/28/2013  . BMI 30.0-30.9,adult  01/27/2013  . Subarachnoid hemorrhage following injury 2010    [place] 11/22/2020, 9:30 AM  [facility] [place], [place], [address] Phone: [phone]   Fax:  [phone]  Name: Melissa Rodgers MRN: [id number] Date of Birth: March 07, 1951

## 2020-11-27 NOTE — Progress Notes (Deleted)
Cardiology Office Note:    Date:  11/27/2020   ID:  [name], DOB December 04, 1950, MRN [id number]  PCP:  [name], DO  Cardiologist:  Shirlee More, MD    Referring MD: [name], DO    ASSESSMENT:    No diagnosis found. PLAN:    In order of problems listed above:  1. ***   Next appointment: ***   Medication Adjustments/Labs and Tests Ordered: Current medicines are reviewed at length with the patient today.  Concerns regarding medicines are outlined above.  No orders of the defined types were placed in this encounter.  No orders of the defined types were placed in this encounter.   No chief complaint on file.   History of Present Illness:    Melissa Rodgers is a 70 y.o. female with a hx of paroxysmal atrial fibrillation suppressed with flecainide and anticoagulated and hypertension last seen 09/12/2019. Compliance with diet, lifestyle and medications: *** Past Medical History:  Diagnosis Date  . Atrial fibrillation 06/18/2019  . BMI 30.0-30.9,adult 01/27/2013  . Candidosis of skin 04/17/2016   Formatting of this note might be different from the original. lower abdomen  . Cherry angioma 04/17/2016  . Chronic bilateral low back pain without sciatica 10/07/2016  . 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  Marland Kitchen Dermal nevus of left forearm 04/17/2016  . Diverticulitis of sigmoid colon 08/01/2019  . Dry eye syndrome of both eyes 09/09/2018  . Dry skin 04/17/2016  . Essential hypertension 09/09/2018  . Fibromyalgia   . Gastroesophageal reflux disease without esophagitis 11/06/2015  . Generalized anxiety disorder with panic attacks 08/01/2019  . History of palpitations 09/09/2018  . Hyperlipidemia 09/09/2018  . 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  . Knee pain, acute 03/05/2020  . 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 Major depressive disorder 09/09/2018  . Microscopic hematuria 09/09/2018  .  Multiple benign nevi 04/17/2016  . Non-seasonal allergic rhinitis 09/09/2018  . Osteoarthritis   . Palpitations 09/28/2013   Formatting of this note is different from the original. 01/29/15:  24 Hour Holter Monitor CONCLUSIONS: 1. Overall normal 24-hour Holter monitor recording, with rare, brief,  asymptomatic nonsustained supraventricular tachycardia as described.  2. No cardiac rhythm explanation for patient's symptoms.     . Primary osteoarthritis of both knees 10/07/2016   Formatting of this note might be different from the original. 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  Marland Kitchen Primary osteoarthritis of right knee 11/21/2014  . S/P laparoscopic fundoplication [id number]  . Scar condition and fibrosis of skin 04/17/2016   Formatting of this note might be different from the original. lower abdomen  . Sebaceous hyperplasia 04/17/2016  . Seborrheic keratoses 04/17/2016  . Skin tags, multiple acquired 04/17/2016  . Solar lentigo 04/17/2016  . Subarachnoid hemorrhage following injury 2010   Fall from horse; brief hospitalization    Past Surgical History:  Procedure Laterality Date  . ESOPHAGOGASTRODUODENOSCOPY     possibly last one was 2017. In [place]  . HERNIA REPAIR    . MEDIAL PARTIAL KNEE REPLACEMENT Right 12/14/2014  . NISSEN FUNDOPLICATION  [id number]  . OOPHORECTOMY    . PARTIAL KNEE ARTHROPLASTY Left 04/05/2014    Current Medications: No outpatient medications have been marked as taking for the 11/28/20 encounter (Appointment) with Richardo Priest, MD.     Allergies:   Rosuvastatin and Sulfa antibiotics   Social History   Socioeconomic History  . Marital status:  Married    Spouse name: Not on file  . Number of children: 0  . Years of education: 62  . Highest education level: Master's degree (e.g., MA, MS, MEng, MEd, MSW, MBA)  Occupational History  . Occupation: Retired  Tobacco Use  . Smoking status: Former Smoker    Types: Cigarettes     Quit date: 08/18/1983    Years since quitting: 37.3  . Smokeless tobacco: Never Used  Vaping Use  . Vaping Use: Never used  Substance and Sexual Activity  . Alcohol use: Yes    Alcohol/week: 7.0 - 14.0 standard drinks    Types: 7 - 14 Glasses of wine per week    Comment: 1-2 drinks per night, usually wine  . Drug use: Never  . Sexual activity: Yes    Partners: Male    Birth control/protection: None  Other Topics Concern  . Not on file  Social History Narrative  . Not on file   Social Determinants of Health   Financial Resource Strain: Not on file  Food Insecurity: Not on file  Transportation Needs: Not on file  Physical Activity: Not on file  Stress: Not on file  Social Connections: Not on file     Family History: The patient's ***family history includes Alzheimer's disease in her father; Atrial fibrillation in her sister; Diabetes in her mother; Heart disease in her mother; Tremor in an other family member. There is no history of Colon cancer or Esophageal cancer. ROS:   Please see the history of present illness.    All other systems reviewed and are negative.  EKGs/Labs/Other Studies Reviewed:    The following studies were reviewed today:  EKG:  EKG ordered today and personally reviewed.  The ekg ordered today demonstrates ***  Recent Labs: 09/13/2020: ALT 19; BUN 14; Creat 0.63; Hemoglobin 14.1; Platelets 278; Potassium 4.2; Sodium 139; TSH 2.78  Recent Lipid Panel    Component Value Date/Time   CHOL 220 (H) 02/22/2020 0920   TRIG 229 (H) 02/22/2020 0920   HDL 48 (L) 02/22/2020 0920   CHOLHDL 4.6 02/22/2020 0920   LDLCALC 135 (H) 02/22/2020 0920    Physical Exam:    VS:  There were no vitals taken for this visit.    Wt Readings from Last 3 Encounters:  09/13/20 184 lb 0.6 oz (83.5 kg)  09/11/20 185 lb 1.3 oz (84 kg)  07/17/20 182 lb 8 oz (82.8 kg)     GEN: *** Well nourished, well developed in no acute distress HEENT: Normal NECK: No JVD; No carotid  bruits LYMPHATICS: No lymphadenopathy CARDIAC: ***RRR, no murmurs, rubs, gallops RESPIRATORY:  Clear to auscultation without rales, wheezing or rhonchi  ABDOMEN: Soft, non-tender, non-distended MUSCULOSKELETAL:  No edema; No deformity  SKIN: Warm and dry NEUROLOGIC:  Alert and oriented x 3 PSYCHIATRIC:  Normal affect    Signed, Norman Herrlich, MD  11/27/2020 4:26 PM    [facility]

## 2020-11-28 ENCOUNTER — Ambulatory Visit: Payer: Medicare Other | Admitting: Cardiology

## 2020-12-01 ENCOUNTER — Other Ambulatory Visit: Payer: Self-pay | Admitting: Cardiology

## 2020-12-03 ENCOUNTER — Other Ambulatory Visit: Payer: Self-pay

## 2020-12-03 ENCOUNTER — Ambulatory Visit (INDEPENDENT_AMBULATORY_CARE_PROVIDER_SITE_OTHER): Payer: Medicare Other

## 2020-12-03 ENCOUNTER — Ambulatory Visit (INDEPENDENT_AMBULATORY_CARE_PROVIDER_SITE_OTHER): Payer: Medicare Other | Admitting: Sports Medicine

## 2020-12-03 DIAGNOSIS — M5030 Other cervical disc degeneration, unspecified cervical region: Secondary | ICD-10-CM

## 2020-12-03 DIAGNOSIS — M25512 Pain in left shoulder: Secondary | ICD-10-CM | POA: Diagnosis not present

## 2020-12-03 HISTORY — DX: Other cervical disc degeneration, unspecified cervical region: M50.30

## 2020-12-03 MED ORDER — PREDNISONE 50 MG PO TABS: ORAL_TABLET | ORAL | 0 refills | Status: DC

## 2020-12-03 MED ORDER — GABAPENTIN 300 MG PO CAPS: ORAL_CAPSULE | ORAL | 3 refills | Status: DC

## 2020-12-03 NOTE — Assessment & Plan Note (Signed)
Neck pain referred lesion to the anterior shoulders, down to the hand but not always to the fingertips. Likely cervical DDD, adding x-rays, formal PT, 5 days of prednisone, gabapentin. Return to see me in 6 weeks, MRI for interventional planning if no better, if we do plan an epidural we would have to stop her Eliquis 2 days prior.

## 2020-12-03 NOTE — Assessment & Plan Note (Signed)
Impingement signs at the last visit got much better with a subacromial injection, we will have her do some physical therapy, and return to see me in 6 weeks for this at which point we can consider a second injection versus MRI for surgical planning.

## 2020-12-03 NOTE — Progress Notes (Signed)
    Procedures performed today:    None.  Independent interpretation of notes and tests performed by another provider:   None.  Brief History, Exam, Impression, and Recommendations:    DDD (degenerative disc disease), cervical Neck pain referred lesion to the anterior shoulders, down to the hand but not always to the fingertips. Likely cervical DDD, adding x-rays, formal PT, 5 days of prednisone, gabapentin. Return to see me in 6 weeks, MRI for interventional planning if no better, if we do plan an epidural we would have to stop her Eliquis 2 days prior.  Severe pain of left shoulder Impingement signs at the last visit got much better with a subacromial injection, we will have her do some physical therapy, and return to see me in 6 weeks for this at which point we can consider a second injection versus MRI for surgical planning.    ___________________________________________ [name], M.D., ABFM., CAQSM. Primary Care and [place] [employer] [facility]

## 2020-12-09 ENCOUNTER — Other Ambulatory Visit: Payer: Self-pay | Admitting: Osteopathic Medicine

## 2020-12-10 ENCOUNTER — Encounter: Payer: Self-pay | Admitting: Osteopathic Medicine

## 2020-12-10 ENCOUNTER — Other Ambulatory Visit: Payer: Self-pay | Admitting: Osteopathic Medicine

## 2020-12-10 NOTE — Telephone Encounter (Signed)
Fluoxetine sent, please advise on Clonazepam.

## 2020-12-11 MED ORDER — CLONAZEPAM 0.5 MG PO TABS: 0.2500 mg | ORAL_TABLET | Freq: Two times a day (BID) | ORAL | 0 refills | Status: DC | PRN

## 2020-12-11 MED ORDER — FLUOXETINE HCL 10 MG PO TABS: 10.0000 mg | ORAL_TABLET | Freq: Every day | ORAL | 3 refills | Status: DC

## 2020-12-12 ENCOUNTER — Encounter: Payer: Medicare Other | Admitting: Physical Therapy

## 2020-12-19 ENCOUNTER — Other Ambulatory Visit: Payer: Self-pay

## 2020-12-19 ENCOUNTER — Ambulatory Visit (INDEPENDENT_AMBULATORY_CARE_PROVIDER_SITE_OTHER): Payer: Medicare Other | Admitting: Rehabilitative and Restorative Service Providers"

## 2020-12-19 DIAGNOSIS — M25512 Pain in left shoulder: Secondary | ICD-10-CM

## 2020-12-19 DIAGNOSIS — R29898 Other symptoms and signs involving the musculoskeletal system: Secondary | ICD-10-CM | POA: Diagnosis not present

## 2020-12-19 DIAGNOSIS — R293 Abnormal posture: Secondary | ICD-10-CM | POA: Diagnosis not present

## 2020-12-19 DIAGNOSIS — G8929 Other chronic pain: Secondary | ICD-10-CM | POA: Diagnosis not present

## 2020-12-19 NOTE — Therapy (Signed)
[facility] [place] [place], [place], [address] Phone: [phone]   Fax:  [phone]  Physical Therapy Treatment  Patient Details  Name: Melissa Rodgers MRN: [id number] Date of Birth: Jul 23, 1951 Referring Provider (PT): Aundria Mems, MD   Encounter Date: 12/19/2020   PT End of Session - 12/19/20 0943    Visit Number 8    Number of Visits 12    Date for PT Re-Evaluation 01/06/21    Authorization Type medicare    PT Start Time 0942    PT Stop Time 1035    PT Time Calculation (min) 53 min    Activity Tolerance Patient limited by pain    Behavior During Therapy [facility] for tasks assessed/performed           Past Medical History:  Diagnosis Date  . Atrial fibrillation 06/18/2019  . BMI 30.0-30.9,adult 01/27/2013  . Candidosis of skin 04/17/2016   Formatting of this note might be different from the original. lower abdomen  . Cherry angioma 04/17/2016  . Chronic bilateral low back pain without sciatica 10/07/2016  . 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  Marland Kitchen Dermal nevus of left forearm 04/17/2016  . Diverticulitis of sigmoid colon 08/01/2019  . Dry eye syndrome of both eyes 09/09/2018  . Dry skin 04/17/2016  . Essential hypertension 09/09/2018  . Fibromyalgia   . Gastroesophageal reflux disease without esophagitis 11/06/2015  . Generalized anxiety disorder with panic attacks 08/01/2019  . History of palpitations 09/09/2018  . Hyperlipidemia 09/09/2018  . 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  . Knee pain, acute 03/05/2020  . 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 Major depressive disorder 09/09/2018  . Microscopic hematuria 09/09/2018  . Multiple benign nevi 04/17/2016  . Non-seasonal allergic rhinitis 09/09/2018  . Osteoarthritis   . Palpitations 09/28/2013   Formatting of this note is different from the original. 01/29/15:  24 Hour Holter Monitor  CONCLUSIONS: 1. Overall normal 24-hour Holter monitor recording, with rare, brief,  asymptomatic nonsustained supraventricular tachycardia as described.  2. No cardiac rhythm explanation for patient's symptoms.     . Primary osteoarthritis of both knees 10/07/2016   Formatting of this note might be different from the original. 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  Marland Kitchen Primary osteoarthritis of right knee 11/21/2014  . S/P laparoscopic fundoplication [id number]  . Scar condition and fibrosis of skin 04/17/2016   Formatting of this note might be different from the original. lower abdomen  . Sebaceous hyperplasia 04/17/2016  . Seborrheic keratoses 04/17/2016  . Skin tags, multiple acquired 04/17/2016  . Solar lentigo 04/17/2016  . Subarachnoid hemorrhage following injury 2010   Fall from horse; brief hospitalization    Past Surgical History:  Procedure Laterality Date  . ESOPHAGOGASTRODUODENOSCOPY     possibly last one was 2017. In [place]  . HERNIA REPAIR    . MEDIAL PARTIAL KNEE REPLACEMENT Right 12/14/2014  . NISSEN FUNDOPLICATION  [id number]  . OOPHORECTOMY    . PARTIAL KNEE ARTHROPLASTY Left 04/05/2014    There were no vitals filed for this visit.   Subjective Assessment - 12/19/20 0941    Subjective The patient reports every now and then she gets more serious pain (every couple of days) when moving a certain way.  She reports it is a deep, dull pain that lasts longer.  The patient decided not to take neurontin (recently prescribed).  She feels that a lot  of her symptoms are related to her neck.    Patient Stated Goals Sleep comofortably and use the arm without increasing pain    Currently in Pain? Yes    Pain Score 1     Pain Location Shoulder    Pain Orientation Left    Pain Descriptors / Indicators Discomfort    Pain Type Chronic pain    Pain Onset More than a month ago    Pain Frequency Intermittent    Aggravating Factors  movement, mild at rest     Pain Relieving Factors ice    Multiple Pain Sites Yes    Pain Location Neck    Pain Orientation Lower;Right;Left    Pain Descriptors / Indicators Sore    Pain Type Chronic pain    Pain Onset More than a month ago    Pain Frequency Intermittent    Aggravating Factors  rigorous movement    Pain Relieving Factors retraction              [facility] PT Assessment - 12/19/20 0951      Assessment   Medical Diagnosis Chronic L shoulder pain    Referring Provider (PT) Aundria Mems, MD    Onset Date/Surgical Date 09/13/20    Hand Dominance Left    Prior Therapy in the past for knees and shoulder                         [facility] Adult PT Treatment/Exercise - 12/19/20 0952      Exercises   Exercises Shoulder;Neck      Neck Exercises: Supine   Neck Retraction 10 reps    Cervical Rotation Right;Left;5 reps      Shoulder Exercises: Supine   External Rotation 5 reps;Both;Strengthening    Theraband Level (Shoulder External Rotation) Level 2 (Red)    External Rotation Limitations within tolerable ROM    Flexion AROM;Left    Flexion Limitations within tolerable ROM    Diagonals PROM;Left;5 reps    Other Supine Exercises anterior chest stretch x 2 minutes with arms abducted to 60 degrees      Shoulder Exercises: Sidelying   External Rotation AROM;Strengthening;Left;5 reps    Flexion AAROM    Other Sidelying Exercises AAROM protraction/retraction with patient starting to initiate movement      Shoulder Exercises: Stretch   Elbow Flexion AROM;Strengthening;Left;10 reps   with red band to tolerance     Modalities   Modalities Traction      Traction   Type of Traction Cervical    Min (lbs) 10    Max (lbs) 15    Hold Time 45    Rest Time 15    Time 10 minutes      Manual Therapy   Manual Therapy Soft tissue mobilization;Manual Traction;Myofascial release;Scapular mobilization    Manual therapy comments to reduce pain and muscle guarding    Soft tissue  mobilization STM to scalenes, splenius capitus, cervical multifidi, L levator scapula    Myofascial Release suboccipital release supine    Scapular Mobilization sidelying scapular mobility and parascapular STM    Manual Traction supine cervical manual traction relaxes musculature---therefore tried mechanical traction                       PT Long Term Goals - 11/22/20 0930      PT LONG TERM GOAL #1   Title The patient will be indep with HEP.    Time  6    Period Weeks    Status Revised    Target Date 01/03/21      PT LONG TERM GOAL #2   Title The patient will reduce FOTO from 47% to 37% limitation.    Time 6    Period Weeks    Status Revised    Target Date 01/03/21                 Plan - 12/19/20 1024    Clinical Impression Statement The patient has continued L upper quadrant pain that seems to originate in L c-spine and radiate into parascapular and proximal shoulder.  PT added cervical traction to patient tolerance today.  She tolerated at a shorter duration today.    PT Treatment/Interventions ADLs/Self Care Home Management;Taping;Dry needling;Manual techniques;Therapeutic activities;Therapeutic exercise;Electrical Stimulation;Moist Heat;Cryotherapy;Patient/family education;Passive range of motion;Iontophoresis 4mg /ml Dexamethasone    PT Next Visit Plan iontophoresis anterior L shoulder (AVOID-- had skin reaction);  isometrics, A/AROM, scapualr + postural strengthening, STM as needed.  *How did patient feel after cervical traction?    PT Home Exercise Plan Access Code: [email or web address]    Consulted and Agree with Plan of Care Patient           Patient will benefit from skilled therapeutic intervention in order to improve the following deficits and impairments:     Visit Diagnosis: Chronic left shoulder pain  Abnormal posture  Other symptoms and signs involving the musculoskeletal system     Problem List Patient Active Problem List   Diagnosis Date Noted   . DDD (degenerative disc disease), cervical 12/03/2020  . Severe pain of left shoulder 11/05/2020  . Fibromyalgia 03/05/2020  . Knee pain, acute 03/05/2020  . Osteoarthritis 03/05/2020  . Diverticulitis of sigmoid colon 08/01/2019  . Generalized anxiety disorder with panic attacks 08/01/2019  . Unspecified atrial fibrillation 06/18/2019  . Major depressive disorder 09/09/2018  . Dry eye syndrome of both eyes 09/09/2018  . Microscopic hematuria 09/09/2018  . Non-seasonal allergic rhinitis 09/09/2018  . Hyperlipidemia 09/09/2018  . Essential hypertension 09/09/2018  . History of palpitations 09/09/2018  . Chronic bilateral low back pain without sciatica 10/07/2016  . Chronic bilateral upper abdominal pain 10/07/2016  . Primary osteoarthritis of both knees 10/07/2016  . Candidosis of skin 04/17/2016  . Cherry angioma 04/17/2016  . Dry skin 04/17/2016  . Dermal nevus of left forearm 04/17/2016  . Multiple benign nevi 04/17/2016  . Scar condition and fibrosis of skin 04/17/2016  . Sebaceous hyperplasia 04/17/2016  . Seborrheic keratoses 04/17/2016  . Skin tags, multiple acquired 04/17/2016  . Solar lentigo 04/17/2016  . S/P laparoscopic fundoplication 59/16/3846  . Gastroesophageal reflux disease without esophagitis 11/06/2015  . 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  . Primary osteoarthritis of right knee 11/21/2014  . 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 Palpitations 09/28/2013  . BMI 30.0-30.9,adult 01/27/2013  . Subarachnoid hemorrhage following injury 2010    [place], PT 12/19/2020, 10:35 AM  [facility] [place], [place], [address] Phone: [phone]   Fax:  [phone]  Name: Melissa Rodgers MRN: [id number] Date of Birth: 1951-09-08

## 2020-12-22 NOTE — Progress Notes (Signed)
Cardiology Office Note:    Date:  12/24/2020   ID:  Melissa Rodgers, DOB 1951-05-01, MRN [id number]  PCP:  [name], DO  Cardiologist:  Shirlee More, MD    Referring MD: [name], DO    ASSESSMENT:    1. PAF (paroxysmal atrial fibrillation) ([place])   2. High risk medication use   3. Chronic anticoagulation   4. Essential hypertension    PLAN:    In order of problems listed above:  1. Overall she is done well I think the day of the shoulder steroid injection she was having atrial tachycardia in the future she will take a tablet of her Cardizem as needed for this.  She will continue her flecainide anticoagulant and given instructions how to manage her anticoagulant for epidural injections. 2. At target continue current treatment including ARB   Next appointment: 6 months   Medication Adjustments/Labs and Tests Ordered: Current medicines are reviewed at length with the patient today.  Concerns regarding medicines are outlined above.  No orders of the defined types were placed in this encounter.  No orders of the defined types were placed in this encounter.   Chief Complaint  Patient presents with  . Follow-up  . Atrial Fibrillation    He requested to be seen today    History of Present Illness:    Melissa Rodgers is a 70 y.o. female with a hx of paroxysmal atrial fibrillation maintaining sinus rhythm on flecainide chronic anticoagulation and hypertension.  Last seen 09/11/2020. Compliance with diet, lifestyle and medications: Yes  She is having left shoulder pain as well as cervical spine pain radiculopathy and has had a steroid injection with a good symptomatic response.  The day she was injected with steroids her heart was rapid 90 to 100 bpm she is very aware of it she did strips with her watch and with her adapter for the phone and there is no established episodes of atrial fibrillation.  I suspect she was having brief episodes of atrial tachycardia I  told her in the future take a tablet of Cardizem.  I reviewed in excess of 20 strips from her iPhone today.  Otherwise she is done well no bleeding complications and tolerates flecainide without side effect.  She may have epidural injections and I told her to miss 3 days of her anticoagulant before and 2 days afterwards.  No chest pain shortness of breath or syncope.  I think this is only significant episode of palpitation she has had Past Medical History:  Diagnosis Date  . Atrial fibrillation 06/18/2019  . BMI 30.0-30.9,adult 01/27/2013  . Candidosis of skin 04/17/2016   Formatting of this note might be different from the original. lower abdomen  . Cherry angioma 04/17/2016  . Chronic bilateral low back pain without sciatica 10/07/2016  . 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  Marland Kitchen Dermal nevus of left forearm 04/17/2016  . Diverticulitis of sigmoid colon 08/01/2019  . Dry eye syndrome of both eyes 09/09/2018  . Dry skin 04/17/2016  . Essential hypertension 09/09/2018  . Fibromyalgia   . Gastroesophageal reflux disease without esophagitis 11/06/2015  . Generalized anxiety disorder with panic attacks 08/01/2019  . History of palpitations 09/09/2018  . Hyperlipidemia 09/09/2018  . 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  . Knee pain, acute 03/05/2020  . 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 Major depressive disorder 09/09/2018  . Microscopic hematuria 09/09/2018  . Multiple benign nevi 04/17/2016  .  Non-seasonal allergic rhinitis 09/09/2018  . Osteoarthritis   . Palpitations 09/28/2013   Formatting of this note is different from the original. 01/29/15:  24 Hour Holter Monitor CONCLUSIONS: 1. Overall normal 24-hour Holter monitor recording, with rare, brief,  asymptomatic nonsustained supraventricular tachycardia as described.  2. No cardiac rhythm explanation for patient's symptoms.     . Primary osteoarthritis of both knees 10/07/2016    Formatting of this note might be different from the original. 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  Marland Kitchen Primary osteoarthritis of right knee 11/21/2014  . S/P laparoscopic fundoplication 8/46/9629  . Scar condition and fibrosis of skin 04/17/2016   Formatting of this note might be different from the original. lower abdomen  . Sebaceous hyperplasia 04/17/2016  . Seborrheic keratoses 04/17/2016  . Skin tags, multiple acquired 04/17/2016  . Solar lentigo 04/17/2016  . Subarachnoid hemorrhage following injury 2010   Fall from horse; brief hospitalization    Past Surgical History:  Procedure Laterality Date  . ESOPHAGOGASTRODUODENOSCOPY     possibly last one was 2017. In [place]  . HERNIA REPAIR    . MEDIAL PARTIAL KNEE REPLACEMENT Right 12/14/2014  . NISSEN FUNDOPLICATION  52/84/1324  . OOPHORECTOMY    . PARTIAL KNEE ARTHROPLASTY Left 04/05/2014    Current Medications: Current Meds  Medication Sig  . acetaminophen-codeine (TYLENOL #3) 300-30 MG tablet Take 1-2 tablets by mouth every 4 (four) hours as needed for moderate pain.  Marland Kitchen amoxicillin (AMOXIL) 500 MG capsule TAKE 4 CAPSULES 1 HOURS BEFORE DENTAL APPOINTMENTS.  Marland Kitchen Cetirizine HCl 10 MG CAPS Take 10 mg by mouth daily.   . Cholecalciferol (VITAMIN D-3) 1000 units CAPS Take 1 capsule by mouth daily.   . clonazePAM (KLONOPIN) 0.5 MG tablet Take 0.5-1 tablets (0.25-0.5 mg total) by mouth 2 (two) times daily as needed for anxiety.  . Cyanocobalamin (B-12 PO) Take by mouth daily.   . diclofenac Sodium (VOLTAREN) 1 % GEL Place onto the skin as needed.   . diltiazem (CARDIZEM) 30 MG tablet Take 1 tablet (30 mg total) by mouth every 4 (four) hours as needed.  Marland Kitchen ELIQUIS 5 MG TABS tablet TAKE 1 TABLET TWICE A DAY  . flecainide (TAMBOCOR) 50 MG tablet TAKE 1 TABLET(50 MG) BY MOUTH TWICE DAILY  . FLUoxetine (PROZAC) 10 MG tablet Take 1 tablet (10 mg total) by mouth daily.  Marland Kitchen losartan (COZAAR) 50 MG tablet Take 25 mg  by mouth daily.  Marland Kitchen LYSINE PO Take 1 tablet by mouth daily as needed.   . metoprolol succinate (TOPROL-XL) 25 MG 24 hr tablet TAKE 1 TABLET(25 MG) BY MOUTH DAILY  . traMADol (ULTRAM) 50 MG tablet Take 1-2 tablets (50-100 mg total) by mouth every 8 (eight) hours as needed for moderate pain. Maximum 6 tabs per day.     Allergies:   Rosuvastatin and Sulfa antibiotics   Social History   Socioeconomic History  . Marital status: Married    Spouse name: Not on file  . Number of children: 0  . Years of education: 21  . Highest education level: Master's degree (e.g., MA, MS, MEng, MEd, MSW, MBA)  Occupational History  . Occupation: Retired  Tobacco Use  . Smoking status: Former Smoker    Types: Cigarettes    Quit date: 08/18/1983    Years since quitting: 37.3  . Smokeless tobacco: Never Used  Vaping Use  . Vaping Use: Never used  Substance and Sexual Activity  . Alcohol use: Yes  Alcohol/week: 7.0 - 14.0 standard drinks    Types: 7 - 14 Glasses of wine per week    Comment: 1-2 drinks per night, usually wine  . Drug use: Never  . Sexual activity: Yes    Partners: Male    Birth control/protection: None  Other Topics Concern  . Not on file  Social History Narrative  . Not on file   Social Determinants of Health   Financial Resource Strain: Not on file  Food Insecurity: Not on file  Transportation Needs: Not on file  Physical Activity: Not on file  Stress: Not on file  Social Connections: Not on file     Family History: The patient's family history includes Alzheimer's disease in her father; Atrial fibrillation in her sister; Diabetes in her mother; Heart disease in her mother; Tremor in an other family member. There is no history of Colon cancer or Esophageal cancer. ROS:   Please see the history of present illness.    All other systems reviewed and are negative.  EKGs/Labs/Other Studies Reviewed:    The following studies were reviewed today:  EKG:  EKG ordered today  and personally reviewed.  The ekg ordered today demonstrates sinus rhythm normal EKG no evidence of 1C antiarrhythmic drug toxicity  Recent Labs: 09/13/2020: ALT 19; BUN 14; Creat 0.63; Hemoglobin 14.1; Platelets 278; Potassium 4.2; Sodium 139; TSH 2.78  Recent Lipid Panel    Component Value Date/Time   CHOL 220 (H) 02/22/2020 0920   TRIG 229 (H) 02/22/2020 0920   HDL 48 (L) 02/22/2020 0920   CHOLHDL 4.6 02/22/2020 0920   LDLCALC 135 (H) 02/22/2020 0920    Physical Exam:    VS:  Ht 5\' 3"  (1.6 m)   Wt 184 lb 1.9 oz (83.5 kg)   BMI 32.62 kg/m     Wt Readings from Last 3 Encounters:  12/24/20 184 lb 1.9 oz (83.5 kg)  09/13/20 184 lb 0.6 oz (83.5 kg)  09/11/20 185 lb 1.3 oz (84 kg)     GEN:  Well nourished, well developed in no acute distress HEENT: Normal NECK: No JVD; No carotid bruits LYMPHATICS: No lymphadenopathy CARDIAC: RRR, no murmurs, rubs, gallops RESPIRATORY:  Clear to auscultation without rales, wheezing or rhonchi  ABDOMEN: Soft, non-tender, non-distended MUSCULOSKELETAL:  No edema; No deformity  SKIN: Warm and dry NEUROLOGIC:  Alert and oriented x 3 PSYCHIATRIC:  Normal affect    Signed, Shirlee More, MD  12/24/2020 8:35 AM    [facility]

## 2020-12-24 ENCOUNTER — Ambulatory Visit (INDEPENDENT_AMBULATORY_CARE_PROVIDER_SITE_OTHER): Payer: Medicare Other | Admitting: Cardiology

## 2020-12-24 ENCOUNTER — Other Ambulatory Visit: Payer: Self-pay

## 2020-12-24 ENCOUNTER — Encounter: Payer: Self-pay | Admitting: Cardiology

## 2020-12-24 VITALS — BP 130/82 | HR 64 | Ht 63.0 in | Wt 184.1 lb

## 2020-12-24 DIAGNOSIS — Z7901 Long term (current) use of anticoagulants: Secondary | ICD-10-CM

## 2020-12-24 DIAGNOSIS — I10 Essential (primary) hypertension: Secondary | ICD-10-CM

## 2020-12-24 DIAGNOSIS — I480 Paroxysmal atrial fibrillation: Secondary | ICD-10-CM | POA: Diagnosis not present

## 2020-12-24 DIAGNOSIS — Z79899 Other long term (current) drug therapy: Secondary | ICD-10-CM

## 2020-12-24 NOTE — Patient Instructions (Signed)
Medication Instructions:  Your physician recommends that you continue on your current medications as directed. Please refer to the Current Medication list given to you today.  For a symptomatic episode with a heart rate greater than 90 beats per minute please take an extra tablet of your Cardizem.  *If you need a refill on your cardiac medications before your next appointment, please call your pharmacy*   Lab Work: None If you have labs (blood work) drawn today and your tests are completely normal, you will receive your results only by: Marland Kitchen MyChart Message (if you have MyChart) OR . A paper copy in the mail If you have any lab test that is abnormal or we need to change your treatment, we will call you to review the results.   Testing/Procedures: None   Follow-Up: At [facility], you and your health needs are our priority.  As part of our continuing mission to provide you with exceptional heart care, we have created designated Provider Care Teams.  These Care Teams include your primary Cardiologist (physician) and Advanced Practice Providers (APPs -  Physician Assistants and Nurse Practitioners) who all work together to provide you with the care you need, when you need it.  We recommend signing up for the patient portal called "MyChart".  Sign up information is provided on this After Visit Summary.  MyChart is used to connect with patients for Virtual Visits (Telemedicine).  Patients are able to view lab/test results, encounter notes, upcoming appointments, etc.  Non-urgent messages can be sent to your provider as well.   To learn more about what you can do with MyChart, go to [email or web address].    Your next appointment:   6 month(s)  The format for your next appointment:   In Person  Provider:   Shirlee More, MD   Other Instructions For your epidural please hold your Eliquis for 3 full days prior to your procedure and resume 2 days after.

## 2020-12-26 ENCOUNTER — Other Ambulatory Visit: Payer: Self-pay

## 2020-12-26 ENCOUNTER — Encounter: Payer: Self-pay | Admitting: Rehabilitative and Restorative Service Providers"

## 2020-12-26 ENCOUNTER — Ambulatory Visit (INDEPENDENT_AMBULATORY_CARE_PROVIDER_SITE_OTHER): Payer: Medicare Other | Admitting: Rehabilitative and Restorative Service Providers"

## 2020-12-26 DIAGNOSIS — R293 Abnormal posture: Secondary | ICD-10-CM

## 2020-12-26 DIAGNOSIS — M25512 Pain in left shoulder: Secondary | ICD-10-CM

## 2020-12-26 DIAGNOSIS — R29898 Other symptoms and signs involving the musculoskeletal system: Secondary | ICD-10-CM

## 2020-12-26 DIAGNOSIS — G8929 Other chronic pain: Secondary | ICD-10-CM

## 2020-12-26 DIAGNOSIS — M170 Bilateral primary osteoarthritis of knee: Secondary | ICD-10-CM

## 2020-12-26 MED ORDER — TRAMADOL HCL 50 MG PO TABS: 50.0000 mg | ORAL_TABLET | Freq: Three times a day (TID) | ORAL | 0 refills | Status: DC | PRN

## 2020-12-26 NOTE — Therapy (Signed)
[address] [place], [place], [address] Phone: [phone]   Fax:  [phone]  Physical Therapy Treatment  Patient Details  Name: Melissa Rodgers MRN: [id number] Date of Birth: 12/15/1950 Referring Provider (PT): Aundria Mems, MD   Encounter Date: 12/26/2020   PT End of Session - 12/26/20 1030    Visit Number 9    Number of Visits 12    Date for PT Re-Evaluation 01/06/21    Authorization Type medicare    PT Start Time 1020    PT Stop Time 1100    PT Time Calculation (min) 40 min    Activity Tolerance Patient limited by pain;Patient tolerated treatment well    Behavior During Therapy [facility] for tasks assessed/performed           Past Medical History:  Diagnosis Date  . Atrial fibrillation 06/18/2019  . BMI 30.0-30.9,adult 01/27/2013  . Candidosis of skin 04/17/2016   Formatting of this note might be different from the original. lower abdomen  . Cherry angioma 04/17/2016  . Chronic bilateral low back pain without sciatica 10/07/2016  . 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  Marland Kitchen Dermal nevus of left forearm 04/17/2016  . Diverticulitis of sigmoid colon 08/01/2019  . Dry eye syndrome of both eyes 09/09/2018  . Dry skin 04/17/2016  . Essential hypertension 09/09/2018  . Fibromyalgia   . Gastroesophageal reflux disease without esophagitis 11/06/2015  . Generalized anxiety disorder with panic attacks 08/01/2019  . History of palpitations 09/09/2018  . Hyperlipidemia 09/09/2018  . 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  . Knee pain, acute 03/05/2020  . 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 Major depressive disorder 09/09/2018  . Microscopic hematuria 09/09/2018  . Multiple benign nevi 04/17/2016  . Non-seasonal allergic rhinitis 09/09/2018  . Osteoarthritis   . Palpitations 09/28/2013   Formatting of this note is different from the original.  01/29/15:  24 Hour Holter Monitor CONCLUSIONS: 1. Overall normal 24-hour Holter monitor recording, with rare, brief,  asymptomatic nonsustained supraventricular tachycardia as described.  2. No cardiac rhythm explanation for patient's symptoms.     . Primary osteoarthritis of both knees 10/07/2016   Formatting of this note might be different from the original. 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  Marland Kitchen Primary osteoarthritis of right knee 11/21/2014  . S/P laparoscopic fundoplication 8/34/1962  . Scar condition and fibrosis of skin 04/17/2016   Formatting of this note might be different from the original. lower abdomen  . Sebaceous hyperplasia 04/17/2016  . Seborrheic keratoses 04/17/2016  . Skin tags, multiple acquired 04/17/2016  . Solar lentigo 04/17/2016  . Subarachnoid hemorrhage following injury 2010   Fall from horse; brief hospitalization    Past Surgical History:  Procedure Laterality Date  . ESOPHAGOGASTRODUODENOSCOPY     possibly last one was 2017. In [place]  . HERNIA REPAIR    . MEDIAL PARTIAL KNEE REPLACEMENT Right 12/14/2014  . NISSEN FUNDOPLICATION  22/97/9892  . OOPHORECTOMY    . PARTIAL KNEE ARTHROPLASTY Left 04/05/2014    There were no vitals filed for this visit.   Subjective Assessment - 12/26/20 1023    Subjective "I've had one of the worst weeks ever" with her shoulder.  She feels the pain has gradually krept back in.  She is alternating hot/cold to tolerance.  She is taking meds.  She stopped doing exercises since pain got worse on Saturday and increased  on Monday.    Patient Stated Goals Sleep comofortably and use the arm without increasing pain    Currently in Pain? Yes    Pain Score 6    goes up at night "just torture"   Pain Location Shoulder    Pain Orientation Left    Pain Descriptors / Indicators Sore;Sharp;Discomfort    Pain Onset More than a month ago    Pain Frequency Intermittent    Aggravating Factors  night time     Pain Relieving Factors unsure              [facility] PT Assessment - 12/26/20 1034      Assessment   Medical Diagnosis Chronic L shoulder pain    Referring Provider (PT) Aundria Mems, MD    Onset Date/Surgical Date 09/13/20    Hand Dominance Left                         [facility] Adult PT Treatment/Exercise - 12/26/20 1034      Exercises   Exercises Shoulder;Neck      Shoulder Exercises: Seated   Flexion AAROM;Left;10 reps      Shoulder Exercises: Pulleys   Flexion 2 minutes    Scaption 2 minutes    Other Pulley Exercises in tolerable ROM      Modalities   Modalities Electrical Stimulation;Moist Heat      Moist Heat Therapy   Number Minutes Moist Heat 12 Minutes    Moist Heat Location Cervical;Shoulder      Electrical Stimulation   Electrical Stimulation Location L cervical region    Electrical Stimulation Action interferential    Electrical Stimulation Parameters to tolerance    Electrical Stimulation Goals Pain      Manual Therapy   Manual Therapy Joint mobilization;Soft tissue mobilization    Manual therapy comments to reduce pain and muscle guarding    Joint Mobilization grade I-II lateral mid cervical glide in R sidelying    Soft tissue mobilization STM to scalenes, levator, upper trap and cervical paraspinals    Myofascial Release suboccipital release supine    Manual Traction supine cervical manual traction relaxes musculature                  PT Education - 12/26/20 1244    Education Details HeP    Person(s) Educated Patient    Methods Explanation;Demonstration;Handout    Comprehension Verbalized understanding;Returned demonstration               PT Long Term Goals - 11/22/20 0930      PT LONG TERM GOAL #1   Title The patient will be indep with HEP.    Time 6    Period Weeks    Status Revised    Target Date 01/03/21      PT LONG TERM GOAL #2   Title The patient will reduce FOTO from 47% to 37% limitation.     Time 6    Period Weeks    Status Revised    Target Date 01/03/21                 Plan - 12/26/20 1245    Clinical Impression Statement The patient has worsening L upper quadrant pain today.   She has point tenderness to palpation in L scalenes and paraspinal muculature.  She has pain in anterior and proximal shoulder and levator/rhomboid region.  She describes a deep achiness and notes hard to get comfortable  at night.  PT to reach back out to MD due to worsening symptoms.    PT Treatment/Interventions ADLs/Self Care Home Management;Taping;Dry needling;Manual techniques;Therapeutic activities;Therapeutic exercise;Electrical Stimulation;Moist Heat;Cryotherapy;Patient/family education;Passive range of motion;Iontophoresis 4mg /ml Dexamethasone    PT Next Visit Plan A/AROM shoulder, strengthening to tolerance, STM and cervical flexibility, postural stability    PT Home Exercise Plan Access Code: [email or web address]    Consulted and Agree with Plan of Care Patient           Patient will benefit from skilled therapeutic intervention in order to improve the following deficits and impairments:     Visit Diagnosis: Chronic left shoulder pain  Abnormal posture  Other symptoms and signs involving the musculoskeletal system     Problem List Patient Active Problem List   Diagnosis Date Noted  . DDD (degenerative disc disease), cervical 12/03/2020  . Severe pain of left shoulder 11/05/2020  . Fibromyalgia 03/05/2020  . Knee pain, acute 03/05/2020  . Osteoarthritis 03/05/2020  . Diverticulitis of sigmoid colon 08/01/2019  . Generalized anxiety disorder with panic attacks 08/01/2019  . Unspecified atrial fibrillation 06/18/2019  . Major depressive disorder 09/09/2018  . Dry eye syndrome of both eyes 09/09/2018  . Microscopic hematuria 09/09/2018  . Non-seasonal allergic rhinitis 09/09/2018  . Hyperlipidemia 09/09/2018  . Essential hypertension 09/09/2018  . History of palpitations  09/09/2018  . Chronic bilateral low back pain without sciatica 10/07/2016  . Chronic bilateral upper abdominal pain 10/07/2016  . Primary osteoarthritis of both knees 10/07/2016  . Candidosis of skin 04/17/2016  . Cherry angioma 04/17/2016  . Dry skin 04/17/2016  . Dermal nevus of left forearm 04/17/2016  . Multiple benign nevi 04/17/2016  . Scar condition and fibrosis of skin 04/17/2016  . Sebaceous hyperplasia 04/17/2016  . Seborrheic keratoses 04/17/2016  . Skin tags, multiple acquired 04/17/2016  . Solar lentigo 04/17/2016  . S/P laparoscopic fundoplication 78/46/9629  . Gastroesophageal reflux disease without esophagitis 11/06/2015  . 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  . Primary osteoarthritis of right knee 11/21/2014  . 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 Palpitations 09/28/2013  . BMI 30.0-30.9,adult 01/27/2013  . Subarachnoid hemorrhage following injury 2010    Giorgi Debruin 12/26/2020, 12:50 PM  [facility] [place] [address] [place], [place], [address] Phone: [phone]   Fax:  [phone]  Name: Melissa Rodgers MRN: [id number] Date of Birth: March 13, 1951

## 2020-12-26 NOTE — Patient Instructions (Signed)
Access Code: [email or web address] URL: [email or web address] Date: 12/26/2020 Prepared by: Rudell Cobb  Exercises Supine Shoulder Flexion Extension AAROM with Dowel - 2-3 x daily - 7 x weekly - 1-3 sets - 10 reps Supine Chest Stretch with Elbows Bent - 1 x daily - 7 x weekly - 2-3 reps - 20-30 hold Supine Shoulder External Rotation in 45 Degrees Abduction AAROM with Dowel - 1 x daily - 7 x weekly - 1 sets - 5 reps - 15 hold Sidelying Shoulder External Rotation - 2 x daily - 7 x weekly - 1 sets - 10 reps Standing Shoulder Internal Rotation Stretch with Towel - 1 x daily - 7 x weekly - 1 sets - 3 reps - 15-30 seconds hold Standing Isometric Shoulder Internal Rotation at Doorway - 2 x daily - 7 x weekly - 1 sets - 10 reps - 5 hold Standing Isometric Shoulder External Rotation with Doorway - 2 x daily - 7 x weekly - 1 sets - 10 reps - 5 hold Isometric Shoulder Flexion at Wall - 2 x daily - 7 x weekly - 1 sets - 10 reps - 5 hold Standing Isometric Shoulder Extension with Doorway - Arm Bent - 2 x daily - 7 x weekly - 3 sets - 10 reps - 5 hold Standing Isometric Shoulder Abduction with Doorway - Arm Bent - 2 x daily - 7 x weekly - 3 sets - 10 reps - 5 hold

## 2020-12-31 ENCOUNTER — Encounter: Payer: Self-pay | Admitting: Osteopathic Medicine

## 2020-12-31 ENCOUNTER — Telehealth: Payer: Medicare Other | Admitting: Osteopathic Medicine

## 2021-01-02 ENCOUNTER — Ambulatory Visit (INDEPENDENT_AMBULATORY_CARE_PROVIDER_SITE_OTHER): Payer: Medicare Other | Admitting: Rehabilitative and Restorative Service Providers"

## 2021-01-02 ENCOUNTER — Other Ambulatory Visit: Payer: Self-pay

## 2021-01-02 DIAGNOSIS — M25512 Pain in left shoulder: Secondary | ICD-10-CM | POA: Diagnosis not present

## 2021-01-02 DIAGNOSIS — R29898 Other symptoms and signs involving the musculoskeletal system: Secondary | ICD-10-CM | POA: Diagnosis not present

## 2021-01-02 DIAGNOSIS — R293 Abnormal posture: Secondary | ICD-10-CM

## 2021-01-02 DIAGNOSIS — G8929 Other chronic pain: Secondary | ICD-10-CM | POA: Diagnosis not present

## 2021-01-02 NOTE — Therapy (Addendum)
[address] [place], [place], [address] Phone: [phone]   Fax:  [phone]  Physical Therapy Treatment and 10th visit progress note  Patient Details  Name: Melissa Rodgers MRN: [id number] Date of Birth: October 02, 1951 Referring Provider (PT): Aundria Mems, MD  Physical Therapy Progress Note   Dates of Reporting Period:10/01/20 to 01/02/21   Objective Measurements: see goals below  Reason Skilled Services are Required: PT continuing to progress there ex and A/AROM to tolerance.  We are working on positioning to reduce pain and use of UE during functional tasks.  Thank you for the referral of this patient.    . Encounter Date: 01/02/2021   PT End of Session - 01/02/21 1019    Visit Number 10    Number of Visits 12    Date for PT Re-Evaluation 01/06/21    Authorization Type medicare    PT Start Time 1017    PT Stop Time 1100    PT Time Calculation (min) 43 min    Activity Tolerance Patient limited by pain;Patient tolerated treatment well    Behavior During Therapy [facility] for tasks assessed/performed           Past Medical History:  Diagnosis Date  . Atrial fibrillation 06/18/2019  . BMI 30.0-30.9,adult 01/27/2013  . Candidosis of skin 04/17/2016   Formatting of this note might be different from the original. lower abdomen  . Cherry angioma 04/17/2016  . Chronic bilateral low back pain without sciatica 10/07/2016  . 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  Marland Kitchen Dermal nevus of left forearm 04/17/2016  . Diverticulitis of sigmoid colon 08/01/2019  . Dry eye syndrome of both eyes 09/09/2018  . Dry skin 04/17/2016  . Essential hypertension 09/09/2018  . Fibromyalgia   . Gastroesophageal reflux disease without esophagitis 11/06/2015  . Generalized anxiety disorder with panic attacks 08/01/2019  . History of palpitations  09/09/2018  . Hyperlipidemia 09/09/2018  . 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  . Knee pain, acute 03/05/2020  . 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 Major depressive disorder 09/09/2018  . Microscopic hematuria 09/09/2018  . Multiple benign nevi 04/17/2016  . Non-seasonal allergic rhinitis 09/09/2018  . Osteoarthritis   . Palpitations 09/28/2013   Formatting of this note is different from the original. 01/29/15:  24 Hour Holter Monitor CONCLUSIONS: 1. Overall normal 24-hour Holter monitor recording, with rare, brief,  asymptomatic nonsustained supraventricular tachycardia as described.  2. No cardiac rhythm explanation for patient's symptoms.     . Primary osteoarthritis of both knees 10/07/2016   Formatting of this note might be different from the original. 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  Marland Kitchen Primary osteoarthritis of right knee 11/21/2014  . S/P laparoscopic fundoplication 02/06/4007  . Scar condition and fibrosis of skin 04/17/2016   Formatting of this note might be different from the original. lower abdomen  . Sebaceous hyperplasia 04/17/2016  . Seborrheic keratoses 04/17/2016  . Skin tags, multiple acquired 04/17/2016  . Solar lentigo 04/17/2016  . Subarachnoid hemorrhage following injury 2010   Fall from horse; brief hospitalization    Past Surgical History:  Procedure Laterality Date  . ESOPHAGOGASTRODUODENOSCOPY     possibly last one was 2017. In [place]  . HERNIA REPAIR    . MEDIAL PARTIAL KNEE REPLACEMENT Right 12/14/2014  . NISSEN FUNDOPLICATION  67/61/9509  . OOPHORECTOMY    . PARTIAL KNEE ARTHROPLASTY Left 04/05/2014  There were no vitals filed for this visit.   Subjective Assessment - 01/02/21 1017    Subjective The patient reports she is feeling better this week as compared to last week.  She is not doing HEP regularly out of fear of increasing pain in the neck and shoulder.    Patient Stated Goals Sleep comofortably and use the arm without  increasing pain    Currently in Pain? Yes    Pain Score 4     Pain Location Shoulder    Pain Orientation Left    Pain Descriptors / Indicators Sore;Aching;Discomfort    Pain Type Chronic pain    Pain Onset More than a month ago    Pain Frequency Intermittent    Aggravating Factors  night time    Pain Relieving Factors unsure              [facility] PT Assessment - 01/02/21 1020      Assessment   Medical Diagnosis Chronic L shoulder pain    Referring Provider (PT) Aundria Mems, MD    Onset Date/Surgical Date 09/13/20    Hand Dominance Left                         [facility] Adult PT Treatment/Exercise - 01/02/21 1020      Exercises   Exercises Shoulder;Neck      Neck Exercises: Machines for Strengthening   UBE (Upper Arm Bike) Level 1 x 2 minutes forward, 1 backward      Shoulder Exercises: Standing   Internal Rotation AAROM;Left;10 reps    Internal Rotation Limitations cane slides up hips    Flexion AAROM    Flexion Limitations on door frame and on elevated mat at incline-- goal is to increase movement without pain    ABduction AAROM;Left;10 reps    ABduction Limitations with cane to shoulder height only at this time; then scaption on ball on elevated mat table x 12 reps with L UE    Extension AAROM;Both;10 reps    Extension Limitations with cane    Retraction Strengthening;Both;10 reps    Other Standing Exercises supported abduction on physioball provokes a tingling sensation in her arm with a pulling sensation in palm of her hand (most likely a neural glide for patient)      Shoulder Exercises: ROM/Strengthening   Pendulum L UE after ther ex due to reports of heaviness and fatigue      Modalities   Modalities Traction      Traction   Type of Traction Cervical    Min (lbs) 10    Max (lbs) 15    Hold Time 45    Rest Time 15    Time 7 minutes   patient did not tolerate due to discomfort where neck rests on traction unit     Manual Therapy    Manual Therapy Manual Traction;Soft tissue mobilization    Manual therapy comments to reduce muscle guarding    Soft tissue mobilization STM L scalenes and suboccipitals    Myofascial Release suboccipital release                       PT Long Term Goals - 11/22/20 0930      PT LONG TERM GOAL #1   Title The patient will be indep with HEP.    Time 6    Period Weeks    Status Revised    Target Date 01/03/21  PT LONG TERM GOAL #2   Title The patient will reduce FOTO from 47% to 37% limitation.    Time 6    Period Weeks    Status Revised    Target Date 01/03/21                 Plan - 01/02/21 1047    Clinical Impression Statement The patient is continuing with L upper quadrant pain with improvement over last week.  PT focused on cervical traction trial (have done one time before) due to contributing cervical pathology and AAROM of the shoulders working on muscle strength and ROM.  PT progressing to patient tolerance.  Plan to check goals and renew next visit.    PT Treatment/Interventions ADLs/Self Care Home Management;Taping;Dry needling;Manual techniques;Therapeutic activities;Therapeutic exercise;Electrical Stimulation;Moist Heat;Cryotherapy;Patient/family education;Passive range of motion;Iontophoresis 4mg /ml Dexamethasone    PT Next Visit Plan A/AROM shoulder, strengthening to tolerance, STM and cervical flexibility, postural stability, cervical traction if tolerated    PT Home Exercise Plan Access Code: [email or web address]    Consulted and Agree with Plan of Care Patient           Patient will benefit from skilled therapeutic intervention in order to improve the following deficits and impairments:     Visit Diagnosis: Chronic left shoulder pain  Abnormal posture  Other symptoms and signs involving the musculoskeletal system     Problem List Patient Active Problem List   Diagnosis Date Noted  . DDD (degenerative disc disease), cervical 12/03/2020  .  Severe pain of left shoulder 11/05/2020  . Fibromyalgia 03/05/2020  . Knee pain, acute 03/05/2020  . Osteoarthritis 03/05/2020  . Diverticulitis of sigmoid colon 08/01/2019  . Generalized anxiety disorder with panic attacks 08/01/2019  . Unspecified atrial fibrillation 06/18/2019  . Major depressive disorder 09/09/2018  . Dry eye syndrome of both eyes 09/09/2018  . Microscopic hematuria 09/09/2018  . Non-seasonal allergic rhinitis 09/09/2018  . Hyperlipidemia 09/09/2018  . Essential hypertension 09/09/2018  . History of palpitations 09/09/2018  . Chronic bilateral low back pain without sciatica 10/07/2016  . Chronic bilateral upper abdominal pain 10/07/2016  . Primary osteoarthritis of both knees 10/07/2016  . Candidosis of skin 04/17/2016  . Cherry angioma 04/17/2016  . Dry skin 04/17/2016  . Dermal nevus of left forearm 04/17/2016  . Multiple benign nevi 04/17/2016  . Scar condition and fibrosis of skin 04/17/2016  . Sebaceous hyperplasia 04/17/2016  . Seborrheic keratoses 04/17/2016  . Skin tags, multiple acquired 04/17/2016  . Solar lentigo 04/17/2016  . S/P laparoscopic fundoplication 99/24/2683  . Gastroesophageal reflux disease without esophagitis 11/06/2015  . 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  . Primary osteoarthritis of right knee 11/21/2014  . 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 Palpitations 09/28/2013  . BMI 30.0-30.9,adult 01/27/2013  . Subarachnoid hemorrhage following injury 2010    [place], PT 01/02/2021, 10:38 PM  [facility] [place], [place], [address] Phone: [phone]   Fax:  [phone]  Name: Melissa Rodgers MRN: [id number] Date of Birth: Jul 29, 1951

## 2021-01-09 ENCOUNTER — Encounter: Payer: Medicare Other | Admitting: Rehabilitative and Restorative Service Providers"

## 2021-01-11 ENCOUNTER — Encounter: Payer: Self-pay | Admitting: Rehabilitative and Restorative Service Providers"

## 2021-01-11 ENCOUNTER — Ambulatory Visit (INDEPENDENT_AMBULATORY_CARE_PROVIDER_SITE_OTHER): Payer: Medicare Other | Admitting: Rehabilitative and Restorative Service Providers"

## 2021-01-11 ENCOUNTER — Other Ambulatory Visit: Payer: Self-pay

## 2021-01-11 DIAGNOSIS — R293 Abnormal posture: Secondary | ICD-10-CM

## 2021-01-11 DIAGNOSIS — G8929 Other chronic pain: Secondary | ICD-10-CM

## 2021-01-11 DIAGNOSIS — M25512 Pain in left shoulder: Secondary | ICD-10-CM | POA: Diagnosis not present

## 2021-01-11 DIAGNOSIS — R29898 Other symptoms and signs involving the musculoskeletal system: Secondary | ICD-10-CM | POA: Diagnosis not present

## 2021-01-11 NOTE — Patient Instructions (Signed)
Access Code: [email or web address] URL: [email or web address] Date: 01/11/2021 Prepared by: Rudell Cobb  Exercises Supine Chin Tuck - 2 x daily - 7 x weekly - 1 sets - 5 reps - 3-5 seconds hold Supine Shoulder Flexion Extension AAROM with Dowel - 2 x daily - 7 x weekly - 1 sets - 5 reps Seated Shoulder Flexion Slide at Table Top with Forearm in Neutral - 2 x daily - 7 x weekly - 1 sets - 10 reps Seated Shoulder Circles - 2 x daily - 7 x weekly - 1 sets - 3 reps Seated Cervical Retraction and Rotation - 2 x daily - 7 x weekly - 1 sets - 3 reps Standing Anatomical Position with Scapular Retraction and Depression at Wall - 2 x daily - 7 x weekly - 1 sets - 10 reps Isometric Shoulder Abduction at Wall - 2 x daily - 7 x weekly - 1 sets - 5 reps - 3 seconds hold Shoulder External Rotation Reactive Isometrics - 2 x daily - 7 x weekly - 1 sets - 5 reps Shoulder Internal Rotation Reactive Isometrics - 2 x daily - 7 x weekly - 1 sets - 5 reps

## 2021-01-11 NOTE — Therapy (Signed)
[place], [place], [address] Phone: [phone]   Fax:  [phone]  Physical Therapy Treatment and Renewal Summary  Patient Details  Name: Melissa Rodgers MRN: [id number] Date of Birth: 13-May-1951 Referring Provider (PT): Aundria Mems, MD   Encounter Date: 01/11/2021   PT End of Session - 01/11/21 1406    Visit Number 11    Number of Visits 18    Date for PT Re-Evaluation 02/22/21    Authorization Type medicare    PT Start Time 5277    PT Stop Time 1500    PT Time Calculation (min) 55 min    Activity Tolerance Patient limited by pain;Patient tolerated treatment well    Behavior During Therapy [facility] for tasks assessed/performed           Past Medical History:  Diagnosis Date  . Atrial fibrillation 06/18/2019  . BMI 30.0-30.9,adult 01/27/2013  . Candidosis of skin 04/17/2016   Formatting of this note might be different from the original. lower abdomen  . Cherry angioma 04/17/2016  . Chronic bilateral low back pain without sciatica 10/07/2016  . 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  Marland Kitchen Dermal nevus of left forearm 04/17/2016  . Diverticulitis of sigmoid colon 08/01/2019  . Dry eye syndrome of both eyes 09/09/2018  . Dry skin 04/17/2016  . Essential hypertension 09/09/2018  . Fibromyalgia   . Gastroesophageal reflux disease without esophagitis 11/06/2015  . Generalized anxiety disorder with panic attacks 08/01/2019  . History of palpitations 09/09/2018  . Hyperlipidemia 09/09/2018  . 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  . Knee pain, acute 03/05/2020  . 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 Major depressive disorder 09/09/2018  . Microscopic hematuria 09/09/2018  . Multiple benign nevi 04/17/2016  . Non-seasonal allergic rhinitis 09/09/2018  . Osteoarthritis   . Palpitations 09/28/2013   Formatting of this note is different  from the original. 01/29/15:  24 Hour Holter Monitor CONCLUSIONS: 1. Overall normal 24-hour Holter monitor recording, with rare, brief,  asymptomatic nonsustained supraventricular tachycardia as described.  2. No cardiac rhythm explanation for patient's symptoms.     . Primary osteoarthritis of both knees 10/07/2016   Formatting of this note might be different from the original. 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  Marland Kitchen Primary osteoarthritis of right knee 11/21/2014  . S/P laparoscopic fundoplication 07/17/2352  . Scar condition and fibrosis of skin 04/17/2016   Formatting of this note might be different from the original. lower abdomen  . Sebaceous hyperplasia 04/17/2016  . Seborrheic keratoses 04/17/2016  . Skin tags, multiple acquired 04/17/2016  . Solar lentigo 04/17/2016  . Subarachnoid hemorrhage following injury 2010   Fall from horse; brief hospitalization    Past Surgical History:  Procedure Laterality Date  . ESOPHAGOGASTRODUODENOSCOPY     possibly last one was 2017. In [place]  . HERNIA REPAIR    . MEDIAL PARTIAL KNEE REPLACEMENT Right 12/14/2014  . NISSEN FUNDOPLICATION  61/44/3154  . OOPHORECTOMY    . PARTIAL KNEE ARTHROPLASTY Left 04/05/2014    There were no vitals filed for this visit.   Subjective Assessment - 01/11/21 1402    Subjective The patient cleaned and did a lot of activity around her home Thursday and Friday and hurt Saturday and Sunday.  She began to feel better Monday and is having a better week.  She notes she did start the neurontin and is  sleeping better since beginning this med.    Patient Stated Goals Sleep comofortably and use the arm without increasing pain    Currently in Pain? Yes    Pain Score 3     Pain Location Shoulder    Pain Orientation Left    Pain Descriptors / Indicators Dull;Discomfort;Aching    Pain Type Chronic pain    Pain Onset More than a month ago    Pain Frequency Intermittent    Aggravating  Factors  specific movements    Pain Relieving Factors neurontin              [facility] PT Assessment - 01/11/21 1409      Assessment   Medical Diagnosis Chronic L shoulder pain    Referring Provider (PT) Aundria Mems, MD    Onset Date/Surgical Date 09/13/20                         [facility] Adult PT Treatment/Exercise - 01/11/21 1410      Exercises   Exercises Shoulder;Neck      Neck Exercises: Machines for Strengthening   UBE (Upper Arm Bike) Level 1 x 2 minutes forward, 1 minute backweards      Shoulder Exercises: Supine   Protraction AROM;Strengthening;Right;Left;5 reps    Flexion AROM;AAROM;Strengthening;Right;Left;10 reps      Shoulder Exercises: Prone   Retraction Strengthening;Both;10 reps    Other Prone Exercises scap retraction + shoulder extension x 10 reps      Shoulder Exercises: Standing   External Rotation Strengthening;Right;Left;5 reps    Theraband Level (Shoulder External Rotation) Level 1 (Yellow)    External Rotation Limitations reactive isometrics with ER and IR    Internal Rotation Strengthening;Right;Left;5 reps    Theraband Level (Shoulder Internal Rotation) Level 1 (Yellow)    Internal Rotation Limitations reactive isometrics with ER and IR    ABduction AROM;Left;Right;10 reps    ABduction Limitations scaption    Other Standing Exercises wall lean with moving R and L UE into flexion    Other Standing Exercises Wall lean with shoulder protraction/retraction x 5 reps      Shoulder Exercises: Isometric Strengthening   ABduction 3X5"    ABduction Limitations dec'ing effort to <50% of max      Modalities   Modalities Electrical Stimulation;Moist Heat      Moist Heat Therapy   Number Minutes Moist Heat 12 Minutes    Moist Heat Location Cervical      Electrical Stimulation   Electrical Stimulation Location thoracic and cervical region    Electrical Stimulation Action interferential    Electrical Stimulation Parameters to  tolerance    Electrical Stimulation Goals Pain      Manual Therapy   Manual Therapy Manual Traction;Soft tissue mobilization    Manual therapy comments to reduce muscle guarding    Soft tissue mobilization STM to bilat scalenes, subscapularis and upper trap    Myofascial Release suboccipital release    Manual Traction supine manual cervical traction                  PT Education - 01/11/21 1501    Education Details HEP    Person(s) Educated Patient    Methods Explanation;Demonstration;Handout    Comprehension Verbalized understanding;Returned demonstration               PT Long Term Goals - 01/11/21 1502      PT LONG TERM GOAL #1   Title The patient will  be indep with HEP.    Time 6    Period Weeks    Status On-going    Target Date 02/22/21      PT LONG TERM GOAL #2   Title The patient will reduce FOTO from 47% to 37% limitation.    Time 6    Period Weeks    Status On-going    Target Date 02/22/21      PT LONG TERM GOAL #3   Title The patient will demonstrate reaching to grap a cup off of low shelf of cabinet    Time 6    Period Weeks    Status New    Target Date 02/22/21      PT LONG TERM GOAL #4   Title The patient will report > 1 week without episode of severe pain    Baseline had 2 days this week with bilateral shoulder pain that was significant and limited activity    Time 6    Period Weeks    Target Date 02/22/21      PT LONG TERM GOAL #5   Title The patient will verbalize return to walking program at home for general conditioning.    Time 6    Period Weeks    Target Date 02/22/21                 Plan - 01/11/21 1504    Clinical Impression Statement The patient is continuing to demonstrate some improvement in symptoms with tolerance to bilateral scaption to 90 without pain (fatigues with repetition), and tolerance of parascapular strengthening. She has WNLs neck ROM and functional shoulder ROM.  She has pain with donning a jacket  and pain after doing household chores.  PT to continue working towards progressing strengthening for functional use and also encouraging a return to general physical conditioning (walking program) for overall health.    PT Treatment/Interventions ADLs/Self Care Home Management;Taping;Dry needling;Manual techniques;Therapeutic activities;Therapeutic exercise;Electrical Stimulation;Moist Heat;Cryotherapy;Patient/family education;Passive range of motion;Iontophoresis 4mg /ml Dexamethasone    PT Next Visit Plan A/AROM shoulder, strengthening to tolerance, STM and cervical flexibility, postural stability, cervical traction if tolerated    PT Home Exercise Plan Access Code: [email or web address]    Consulted and Agree with Plan of Care Patient           Patient will benefit from skilled therapeutic intervention in order to improve the following deficits and impairments:     Visit Diagnosis: Chronic left shoulder pain  Abnormal posture  Other symptoms and signs involving the musculoskeletal system     Problem List Patient Active Problem List   Diagnosis Date Noted  . DDD (degenerative disc disease), cervical 12/03/2020  . Severe pain of left shoulder 11/05/2020  . Fibromyalgia 03/05/2020  . Knee pain, acute 03/05/2020  . Osteoarthritis 03/05/2020  . Diverticulitis of sigmoid colon 08/01/2019  . Generalized anxiety disorder with panic attacks 08/01/2019  . Unspecified atrial fibrillation 06/18/2019  . Major depressive disorder 09/09/2018  . Dry eye syndrome of both eyes 09/09/2018  . Microscopic hematuria 09/09/2018  . Non-seasonal allergic rhinitis 09/09/2018  . Hyperlipidemia 09/09/2018  . Essential hypertension 09/09/2018  . History of palpitations 09/09/2018  . Chronic bilateral low back pain without sciatica 10/07/2016  . Chronic bilateral upper abdominal pain 10/07/2016  . Primary osteoarthritis of both knees 10/07/2016  . Candidosis of skin 04/17/2016  . Cherry angioma 04/17/2016  . Dry  skin 04/17/2016  . Dermal nevus of left forearm 04/17/2016  . Multiple benign nevi 04/17/2016  .  Scar condition and fibrosis of skin 04/17/2016  . Sebaceous hyperplasia 04/17/2016  . Seborrheic keratoses 04/17/2016  . Skin tags, multiple acquired 04/17/2016  . Solar lentigo 04/17/2016  . S/P laparoscopic fundoplication 52/17/4715  . Gastroesophageal reflux disease without esophagitis 11/06/2015  . 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  . Primary osteoarthritis of right knee 11/21/2014  . 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 Palpitations 09/28/2013  . BMI 30.0-30.9,adult 01/27/2013  . Subarachnoid hemorrhage following injury 2010    [place], PT 01/11/2021, 4:46 PM  [facility] [place], [place], [address] Phone: [phone]   Fax:  [phone]  Name: Melissa Rodgers MRN: [id number] Date of Birth: [birth year]

## 2021-01-15 ENCOUNTER — Ambulatory Visit (INDEPENDENT_AMBULATORY_CARE_PROVIDER_SITE_OTHER): Payer: Medicare Other

## 2021-01-15 ENCOUNTER — Telehealth: Payer: Self-pay

## 2021-01-15 ENCOUNTER — Ambulatory Visit (INDEPENDENT_AMBULATORY_CARE_PROVIDER_SITE_OTHER): Payer: Medicare Other | Admitting: Sports Medicine

## 2021-01-15 ENCOUNTER — Other Ambulatory Visit: Payer: Self-pay

## 2021-01-15 DIAGNOSIS — M5030 Other cervical disc degeneration, unspecified cervical region: Secondary | ICD-10-CM | POA: Diagnosis not present

## 2021-01-15 DIAGNOSIS — M170 Bilateral primary osteoarthritis of knee: Secondary | ICD-10-CM | POA: Diagnosis not present

## 2021-01-15 DIAGNOSIS — M25512 Pain in left shoulder: Secondary | ICD-10-CM | POA: Diagnosis not present

## 2021-01-15 DIAGNOSIS — M1712 Unilateral primary osteoarthritis, left knee: Secondary | ICD-10-CM | POA: Diagnosis not present

## 2021-01-15 NOTE — Assessment & Plan Note (Signed)
Neck pain referring to the anterior shoulders, down to the hand but not always to the fingertips, cervical DDD on x-rays, she has done really well with prednisone, formal PT and gabapentin, return as needed for this.

## 2021-01-15 NOTE — Progress Notes (Signed)
    Procedures performed today:    Procedure: Real-time Ultrasound Guided injection of the left knee  device: Samsung HS60  Verbal informed consent obtained.  Time-out conducted.  Noted no overlying erythema, induration, or other signs of local infection.  Skin prepped in a sterile fashion.  Local anesthesia: Topical Ethyl chloride.  With sterile technique and under real time ultrasound guidance:  Noted mild effusion, 1 cc Kenalog 40, 2 cc lidocaine, 2 cc bupivacaine injected easily Completed without difficulty  Advised to call if fevers/chills, erythema, induration, drainage, or persistent bleeding.  Images permanently stored and available for review in PACS.  Impression: Technically successful ultrasound guided injection.  Independent interpretation of notes and tests performed by another provider:   None.  Brief History, Exam, Impression, and Recommendations:    DDD (degenerative disc disease), cervical Neck pain referring to the anterior shoulders, down to the hand but not always to the fingertips, cervical DDD on x-rays, she has done really well with prednisone, formal PT and gabapentin, return as needed for this.  Primary osteoarthritis of both knees post bilateral hemiarthroplasty Tameyah returns, she is post bilateral hemiarthroplasty, still has significant pain in the left knee, we injected her left knee. A bone scan several years ago did show potential loosening of the arthroplasty components on the left and lesser so on the right, if she does not respond to an injection I would certainly think orthopedic surgery would need to consider conversion to total arthroplasty. Return in 1 month.  Severe pain of left shoulder Continues to do well with physical therapy, and after her injection about 2 months ago, she did have some popping and other mechanical symptoms, took it easy for a few days and the symptoms resolved, we will simply watch this for now. MRI for surgical planning if  recurrence of discomfort.    ___________________________________________ [name], M.D., ABFM., CAQSM. Primary Care and [employer] Instructor of [employer]

## 2021-01-15 NOTE — Assessment & Plan Note (Signed)
Melissa Rodgers returns, she is post bilateral hemiarthroplasty, still has significant pain in the left knee, we injected her left knee. A bone scan several years ago did show potential loosening of the arthroplasty components on the left and lesser so on the right, if she does not respond to an injection I would certainly think orthopedic surgery would need to consider conversion to total arthroplasty. Return in 1 month.

## 2021-01-15 NOTE — Telephone Encounter (Signed)
Updated medication list

## 2021-01-15 NOTE — Assessment & Plan Note (Signed)
Continues to do well with physical therapy, and after her injection about 2 months ago, she did have some popping and other mechanical symptoms, took it easy for a few days and the symptoms resolved, we will simply watch this for now. MRI for surgical planning if recurrence of discomfort.

## 2021-01-17 ENCOUNTER — Ambulatory Visit (INDEPENDENT_AMBULATORY_CARE_PROVIDER_SITE_OTHER): Payer: Medicare Other | Admitting: Rehabilitative and Restorative Service Providers"

## 2021-01-17 ENCOUNTER — Other Ambulatory Visit: Payer: Self-pay

## 2021-01-17 DIAGNOSIS — M25512 Pain in left shoulder: Secondary | ICD-10-CM | POA: Diagnosis not present

## 2021-01-17 DIAGNOSIS — G8929 Other chronic pain: Secondary | ICD-10-CM | POA: Diagnosis not present

## 2021-01-17 DIAGNOSIS — R29898 Other symptoms and signs involving the musculoskeletal system: Secondary | ICD-10-CM | POA: Diagnosis not present

## 2021-01-17 DIAGNOSIS — R293 Abnormal posture: Secondary | ICD-10-CM

## 2021-01-17 NOTE — Therapy (Signed)
[place], [place], [address] Phone: [phone]   Fax:  [phone]  Physical Therapy Treatment  Patient Details  Name: Melissa Rodgers MRN: [id number] Date of Birth: Apr 14, 1951 Referring Provider (PT): Aundria Mems, MD   Encounter Date: 01/17/2021   PT End of Session - 01/17/21 1237    Visit Number 12    Number of Visits 18    Date for PT Re-Evaluation 02/22/21    Authorization Type medicare    PT Start Time 1147    PT Stop Time 1227    PT Time Calculation (min) 40 min    Activity Tolerance Patient limited by pain;Patient tolerated treatment well    Behavior During Therapy [facility] for tasks assessed/performed           Past Medical History:  Diagnosis Date  . Atrial fibrillation 06/18/2019  . BMI 30.0-30.9,adult 01/27/2013  . Candidosis of skin 04/17/2016   Formatting of this note might be different from the original. lower abdomen  . Cherry angioma 04/17/2016  . Chronic bilateral low back pain without sciatica 10/07/2016  . 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  Marland Kitchen Dermal nevus of left forearm 04/17/2016  . Diverticulitis of sigmoid colon 08/01/2019  . Dry eye syndrome of both eyes 09/09/2018  . Dry skin 04/17/2016  . Essential hypertension 09/09/2018  . Fibromyalgia   . Gastroesophageal reflux disease without esophagitis 11/06/2015  . Generalized anxiety disorder with panic attacks 08/01/2019  . History of palpitations 09/09/2018  . Hyperlipidemia 09/09/2018  . 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  . Knee pain, acute 03/05/2020  . 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 Major depressive disorder 09/09/2018  . Microscopic hematuria 09/09/2018  . Multiple benign nevi 04/17/2016  . Non-seasonal allergic rhinitis 09/09/2018  . Osteoarthritis   . Palpitations 09/28/2013   Formatting of this note is different from the original.  01/29/15:  24 Hour Holter Monitor CONCLUSIONS: 1. Overall normal 24-hour Holter monitor recording, with rare, brief,  asymptomatic nonsustained supraventricular tachycardia as described.  2. No cardiac rhythm explanation for patient's symptoms.     . Primary osteoarthritis of both knees 10/07/2016   Formatting of this note might be different from the original. 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  Marland Kitchen Primary osteoarthritis of right knee 11/21/2014  . S/P laparoscopic fundoplication 5/99/3570  . Scar condition and fibrosis of skin 04/17/2016   Formatting of this note might be different from the original. lower abdomen  . Sebaceous hyperplasia 04/17/2016  . Seborrheic keratoses 04/17/2016  . Skin tags, multiple acquired 04/17/2016  . Solar lentigo 04/17/2016  . Subarachnoid hemorrhage following injury 2010   Fall from horse; brief hospitalization    Past Surgical History:  Procedure Laterality Date  . ESOPHAGOGASTRODUODENOSCOPY     possibly last one was 2017. In [place]  . HERNIA REPAIR    . MEDIAL PARTIAL KNEE REPLACEMENT Right 12/14/2014  . NISSEN FUNDOPLICATION  17/79/3903  . OOPHORECTOMY    . PARTIAL KNEE ARTHROPLASTY Left 04/05/2014    There were no vitals filed for this visit.   Subjective Assessment - 01/17/21 1148    Subjective The neck and arm pain is improving.  She felt somewhat achy after last PT session.    Patient Stated Goals Sleep comofortably and use the arm without increasing pain    Currently in Pain? Yes    Pain Score 2  Pain Location Shoulder    Pain Orientation Left    Pain Descriptors / Indicators Discomfort    Pain Type Chronic pain    Pain Onset More than a month ago    Pain Frequency Intermittent    Aggravating Factors  movement    Pain Relieving Factors neurontin              [facility] PT Assessment - 01/17/21 1159      Assessment   Medical Diagnosis Chronic L shoulder pain    Referring Provider (PT) Aundria Mems, MD    Onset Date/Surgical Date 09/13/20    Hand Dominance Left                         [facility] Adult PT Treatment/Exercise - 01/17/21 1203      Exercises   Exercises Shoulder;Neck      Neck Exercises: Supine   Neck Retraction 5 reps      Neck Exercises: Sidelying   Lateral Flexion Right;Left;5 reps      Shoulder Exercises: Supine   Other Supine Exercises chest press x 8 reps R and L with 1 lb weight      Shoulder Exercises: Prone   Retraction Strengthening;Both;10 reps    Extension AROM;Both;5 reps      Shoulder Exercises: Sidelying   External Rotation AROM;Right;Left;10 reps    Other Sidelying Exercises shoulder elevation/depression x 5 reps each side      Shoulder Exercises: Standing   External Rotation AROM;Strengthening    External Rotation Limitations standing with ER within tolerable ROM    Flexion AAROM;Right;Left;10 reps    Flexion Limitations door frame sliding for warm up    ABduction Strengthening;AROM;5 reps    Shoulder ABduction Weight (lbs) 1    ABduction Limitations scaption initially for AROM, then with 1 lb x 5 reps each    Retraction Strengthening;Both;10 reps    Diagonals AROM;Right;Left;5 reps    Diagonals Limitations D1 flexion and extension    Other Standing Exercises tricep extension with yellow band at doorway x 5 reps R and L sides    Other Standing Exercises Wall lean with shoulder protraction/retraction x 5 reps      Shoulder Exercises: ROM/Strengthening   Rhythmic Stabilization, Supine R and L shoulders @ 90 degrees with multi-directional rhythmic stabilization      Manual Therapy   Manual Therapy Manual Traction;Soft tissue mobilization    Manual therapy comments to reduce muscle guarding    Soft tissue mobilization STM to bilat scalenes    Manual Traction supine manual traction                       PT Long Term Goals - 01/11/21 1502      PT LONG TERM GOAL #1   Title The patient will be indep  with HEP.    Time 6    Period Weeks    Status On-going    Target Date 02/22/21      PT LONG TERM GOAL #2   Title The patient will reduce FOTO from 47% to 37% limitation.    Time 6    Period Weeks    Status On-going    Target Date 02/22/21      PT LONG TERM GOAL #3   Title The patient will demonstrate reaching to grap a cup off of low shelf of cabinet    Time 6    Period Weeks  Status New    Target Date 02/22/21      PT LONG TERM GOAL #4   Title The patient will report > 1 week without episode of severe pain    Baseline had 2 days this week with bilateral shoulder pain that was significant and limited activity    Time 6    Period Weeks    Target Date 02/22/21      PT LONG TERM GOAL #5   Title The patient will verbalize return to walking program at home for general conditioning.    Time 6    Period Weeks    Target Date 02/22/21                 Plan - 01/17/21 1237    Clinical Impression Statement The patient is tolerating increased activity and strengthening today.  PT began adding 1 lb weights and yellow t-band.  Started with low repetitions in order to assess tolerance to these activities.  Plan to have patient continue current HEP, will re-assess, then will progress HEP adding low rep resistance and building up from there.    PT Treatment/Interventions ADLs/Self Care Home Management;Taping;Dry needling;Manual techniques;Therapeutic activities;Therapeutic exercise;Electrical Stimulation;Moist Heat;Cryotherapy;Patient/family education;Passive range of motion;Iontophoresis 4mg /ml Dexamethasone    PT Next Visit Plan A/AROM shoulder, strengthening to tolerance, STM and cervical flexibility, postural stability, cervical traction if tolerated    PT Home Exercise Plan Access Code: [email or web address]    Consulted and Agree with Plan of Care Patient           Patient will benefit from skilled therapeutic intervention in order to improve the following deficits and impairments:      Visit Diagnosis: Chronic left shoulder pain  Abnormal posture  Other symptoms and signs involving the musculoskeletal system     Problem List Patient Active Problem List   Diagnosis Date Noted  . DDD (degenerative disc disease), cervical 12/03/2020  . Severe pain of left shoulder 11/05/2020  . Fibromyalgia 03/05/2020  . Knee pain, acute 03/05/2020  . Osteoarthritis 03/05/2020  . Diverticulitis of sigmoid colon 08/01/2019  . Generalized anxiety disorder with panic attacks 08/01/2019  . Unspecified atrial fibrillation 06/18/2019  . Major depressive disorder 09/09/2018  . Dry eye syndrome of both eyes 09/09/2018  . Microscopic hematuria 09/09/2018  . Non-seasonal allergic rhinitis 09/09/2018  . Hyperlipidemia 09/09/2018  . Essential hypertension 09/09/2018  . History of palpitations 09/09/2018  . Chronic bilateral low back pain without sciatica 10/07/2016  . Chronic bilateral upper abdominal pain 10/07/2016  . Primary osteoarthritis of both knees post bilateral hemiarthroplasty 10/07/2016  . Candidosis of skin 04/17/2016  . Cherry angioma 04/17/2016  . Dry skin 04/17/2016  . Dermal nevus of left forearm 04/17/2016  . Multiple benign nevi 04/17/2016  . Scar condition and fibrosis of skin 04/17/2016  . Sebaceous hyperplasia 04/17/2016  . Seborrheic keratoses 04/17/2016  . Skin tags, multiple acquired 04/17/2016  . Solar lentigo 04/17/2016  . S/P laparoscopic fundoplication 35/00/9381  . Gastroesophageal reflux disease without esophagitis 11/06/2015  . 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  . 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 Palpitations 09/28/2013  . BMI 30.0-30.9,adult 01/27/2013  . Subarachnoid hemorrhage following injury 2010   [place], PT  Geordie Nooney 01/17/2021, 12:39 PM  [facility] [place], [place], [address] Phone: [phone]   Fax:  [phone]  Name: Melissa Rodgers MRN:  [id number] Date of Birth: November 30, 1950

## 2021-01-21 ENCOUNTER — Ambulatory Visit (INDEPENDENT_AMBULATORY_CARE_PROVIDER_SITE_OTHER): Payer: Medicare Other | Admitting: Rehabilitative and Restorative Service Providers"

## 2021-01-21 ENCOUNTER — Other Ambulatory Visit: Payer: Self-pay

## 2021-01-21 DIAGNOSIS — R293 Abnormal posture: Secondary | ICD-10-CM | POA: Diagnosis not present

## 2021-01-21 DIAGNOSIS — R29898 Other symptoms and signs involving the musculoskeletal system: Secondary | ICD-10-CM | POA: Diagnosis not present

## 2021-01-21 DIAGNOSIS — G8929 Other chronic pain: Secondary | ICD-10-CM | POA: Diagnosis not present

## 2021-01-21 DIAGNOSIS — M25512 Pain in left shoulder: Secondary | ICD-10-CM

## 2021-01-21 NOTE — Therapy (Signed)
[place], [place], [address] Phone: [phone]   Fax:  [phone]  Physical Therapy Treatment  Patient Details  Name: Melissa Rodgers MRN: [id number] Date of Birth: 07/21/1951 Referring Provider (PT): Aundria Mems, MD   Encounter Date: 01/21/2021   PT End of Session - 01/21/21 1439    Visit Number 13    Number of Visits 18    Date for PT Re-Evaluation 02/22/21    Authorization Type medicare    PT Start Time 1350    PT Stop Time 1435    PT Time Calculation (min) 45 min    Activity Tolerance Patient limited by pain;Patient tolerated treatment well    Behavior During Therapy [facility] for tasks assessed/performed           Past Medical History:  Diagnosis Date  . Atrial fibrillation 06/18/2019  . BMI 30.0-30.9,adult 01/27/2013  . Candidosis of skin 04/17/2016   Formatting of this note might be different from the original. lower abdomen  . Cherry angioma 04/17/2016  . Chronic bilateral low back pain without sciatica 10/07/2016  . 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  Marland Kitchen Dermal nevus of left forearm 04/17/2016  . Diverticulitis of sigmoid colon 08/01/2019  . Dry eye syndrome of both eyes 09/09/2018  . Dry skin 04/17/2016  . Essential hypertension 09/09/2018  . Fibromyalgia   . Gastroesophageal reflux disease without esophagitis 11/06/2015  . Generalized anxiety disorder with panic attacks 08/01/2019  . History of palpitations 09/09/2018  . Hyperlipidemia 09/09/2018  . 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  . Knee pain, acute 03/05/2020  . 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 Major depressive disorder 09/09/2018  . Microscopic hematuria 09/09/2018  . Multiple benign nevi 04/17/2016  . Non-seasonal allergic rhinitis 09/09/2018  . Osteoarthritis   . Palpitations 09/28/2013   Formatting of this note is different from the original.  01/29/15:  24 Hour Holter Monitor CONCLUSIONS: 1. Overall normal 24-hour Holter monitor recording, with rare, brief,  asymptomatic nonsustained supraventricular tachycardia as described.  2. No cardiac rhythm explanation for patient's symptoms.     . Primary osteoarthritis of both knees 10/07/2016   Formatting of this note might be different from the original. 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  Marland Kitchen Primary osteoarthritis of right knee 11/21/2014  . S/P laparoscopic fundoplication 04/02/2584  . Scar condition and fibrosis of skin 04/17/2016   Formatting of this note might be different from the original. lower abdomen  . Sebaceous hyperplasia 04/17/2016  . Seborrheic keratoses 04/17/2016  . Skin tags, multiple acquired 04/17/2016  . Solar lentigo 04/17/2016  . Subarachnoid hemorrhage following injury 2010   Fall from horse; brief hospitalization    Past Surgical History:  Procedure Laterality Date  . ESOPHAGOGASTRODUODENOSCOPY     possibly last one was 2017. In [place]  . HERNIA REPAIR    . MEDIAL PARTIAL KNEE REPLACEMENT Right 12/14/2014  . NISSEN FUNDOPLICATION  27/78/2423  . OOPHORECTOMY    . PARTIAL KNEE ARTHROPLASTY Left 04/05/2014    There were no vitals filed for this visit.   Subjective Assessment - 01/21/21 1351    Subjective The patient reports that her neck was achy the end of last week.  She has been using e-stim and icy hot intermittently due to pain.  She is sore in shoulders, but not bad pain. She has had one episode of "catching" sensation in the  shoulder since last session.    Patient Stated Goals Sleep comofortably and use the arm without increasing pain    Currently in Pain? Yes    Pain Score 4     Pain Location Shoulder    Pain Orientation Left    Pain Descriptors / Indicators Discomfort;Aching    Pain Type Chronic pain    Pain Onset More than a month ago    Pain Frequency Intermittent    Aggravating Factors  movement    Pain  Relieving Factors neurontin    Multiple Pain Sites Yes    Pain Score 3    Pain Location Neck    Pain Orientation Lower    Pain Descriptors / Indicators Sore    Pain Type Chronic pain    Pain Onset More than a month ago    Pain Frequency Intermittent    Aggravating Factors  unsure    Pain Relieving Factors unsure              [facility] PT Assessment - 01/21/21 1355      Assessment   Medical Diagnosis Chronic L shoulder pain    Referring Provider (PT) Aundria Mems, MD    Onset Date/Surgical Date 09/13/20    Hand Dominance Left                         [facility] Adult PT Treatment/Exercise - 01/21/21 1355      Exercises   Exercises Shoulder;Neck      Neck Exercises: Standing   Upper Extremity Flexion with Stabilization 5 reps      Neck Exercises: Supine   Cervical Isometrics Right lateral flexion;Left lateral flexion;Right rotation;Left rotation;3 secs    Cervical Isometrics Limitations 3 reps    Neck Retraction 5 reps      Neck Exercises: Sidelying   Lateral Flexion Right;Left;5 reps      Shoulder Exercises: Supine   Other Supine Exercises shouler circles R and L x 10 reps CW and CCW      Shoulder Exercises: Sidelying   Flexion AAROM;Left;10 reps    Flexion Limitations with arm resting on bolster    Other Sidelying Exercises sidelying row motion reaching into flexion and then elbow flexion bringing arm to side      Shoulder Exercises: Standing   External Rotation Strengthening;Both;5 reps    Theraband Level (Shoulder External Rotation) Level 2 (Red)    Internal Rotation AAROM;Both;10 reps    Flexion Strengthening;Both;10 reps    Theraband Level (Shoulder Flexion) Level 2 (Red)    Flexion Limitations pushing into a red t-band loop to engage muscles    ABduction Strengthening;Both;12 reps    Shoulder ABduction Weight (lbs) 1    ABduction Limitations scaption to 90 degrees    Other Standing Exercises elbow flexion x 1 lb x 12 reps    Other Standing  Exercises Reactive isometrics R and L sides *patient has modified from stepping laterally to leaning.  We discussed transitioning to stepping as she can tolerate      Shoulder Exercises: ROM/Strengthening   UBE (Upper Arm Bike) L1 x 2.5 minutes forward/1 minute backward      Shoulder Exercises: Stretch   Corner Stretch Limitations doorframe stretch "w" position    Other Shoulder Stretches standing bicep stretch in doorframe x 2 reps x 30 seconds      Modalities   Modalities Moist Heat      Moist Heat Therapy   Number Minutes Moist Heat  10 Minutes    Moist Heat Location Cervical;Shoulder   at end of session while HEP discussed     Manual Therapy   Manual Therapy Manual Traction;Soft tissue mobilization    Manual therapy comments to reduce pain and improve muscle length    Soft tissue mobilization STM to bilateral scalenes and suboccipitals    Myofascial Release suboccipital release    Manual Traction supine manual traction to tolerance                       PT Long Term Goals - 01/11/21 1502      PT LONG TERM GOAL #1   Title The patient will be indep with HEP.    Time 6    Period Weeks    Status On-going    Target Date 02/22/21      PT LONG TERM GOAL #2   Title The patient will reduce FOTO from 47% to 37% limitation.    Time 6    Period Weeks    Status On-going    Target Date 02/22/21      PT LONG TERM GOAL #3   Title The patient will demonstrate reaching to grap a cup off of low shelf of cabinet    Time 6    Period Weeks    Status New    Target Date 02/22/21      PT LONG TERM GOAL #4   Title The patient will report > 1 week without episode of severe pain    Baseline had 2 days this week with bilateral shoulder pain that was significant and limited activity    Time 6    Period Weeks    Target Date 02/22/21      PT LONG TERM GOAL #5   Title The patient will verbalize return to walking program at home for general conditioning.    Time 6    Period  Weeks    Target Date 02/22/21                 Plan - 01/21/21 1439    Clinical Impression Statement The patient is tolerating increased strengthening.  PT is gradually adding at low reps due to irritability of symptoms.  PT will continue to work on progressing to patient tolerance.    PT Treatment/Interventions ADLs/Self Care Home Management;Taping;Dry needling;Manual techniques;Therapeutic activities;Therapeutic exercise;Electrical Stimulation;Moist Heat;Cryotherapy;Patient/family education;Passive range of motion;Iontophoresis 4mg /ml Dexamethasone    PT Next Visit Plan A/AROM shoulder, strengthening to tolerance, STM and cervical flexibility, postural stability, cervical traction if tolerated    PT Home Exercise Plan Access Code: [email or web address]    Consulted and Agree with Plan of Care Patient           Patient will benefit from skilled therapeutic intervention in order to improve the following deficits and impairments:     Visit Diagnosis: Chronic left shoulder pain  Abnormal posture  Other symptoms and signs involving the musculoskeletal system     Problem List Patient Active Problem List   Diagnosis Date Noted  . DDD (degenerative disc disease), cervical 12/03/2020  . Severe pain of left shoulder 11/05/2020  . Fibromyalgia 03/05/2020  . Knee pain, acute 03/05/2020  . Osteoarthritis 03/05/2020  . Diverticulitis of sigmoid colon 08/01/2019  . Generalized anxiety disorder with panic attacks 08/01/2019  . Unspecified atrial fibrillation 06/18/2019  . Major depressive disorder 09/09/2018  . Dry eye syndrome of both eyes 09/09/2018  . Microscopic hematuria 09/09/2018  . Non-seasonal allergic rhinitis  09/09/2018  . Hyperlipidemia 09/09/2018  . Essential hypertension 09/09/2018  . History of palpitations 09/09/2018  . Chronic bilateral low back pain without sciatica 10/07/2016  . Chronic bilateral upper abdominal pain 10/07/2016  . Primary osteoarthritis of both knees  post bilateral hemiarthroplasty 10/07/2016  . Candidosis of skin 04/17/2016  . Cherry angioma 04/17/2016  . Dry skin 04/17/2016  . Dermal nevus of left forearm 04/17/2016  . Multiple benign nevi 04/17/2016  . Scar condition and fibrosis of skin 04/17/2016  . Sebaceous hyperplasia 04/17/2016  . Seborrheic keratoses 04/17/2016  . Skin tags, multiple acquired 04/17/2016  . Solar lentigo 04/17/2016  . S/P laparoscopic fundoplication 52/06/222  . Gastroesophageal reflux disease without esophagitis 11/06/2015  . 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  . 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 Palpitations 09/28/2013  . BMI 30.0-30.9,adult 01/27/2013  . Subarachnoid hemorrhage following injury 2010    [place], PT 01/21/2021, 2:40 PM  [facility] [place], [place], [address] Phone: [phone]   Fax:  [phone]  Name: Melissa Rodgers MRN: [id number] Date of Birth: 05-27-1951

## 2021-01-28 ENCOUNTER — Other Ambulatory Visit: Payer: Self-pay

## 2021-01-28 ENCOUNTER — Ambulatory Visit (INDEPENDENT_AMBULATORY_CARE_PROVIDER_SITE_OTHER): Payer: Medicare Other | Admitting: Rehabilitative and Restorative Service Providers"

## 2021-01-28 DIAGNOSIS — G8929 Other chronic pain: Secondary | ICD-10-CM

## 2021-01-28 DIAGNOSIS — M25512 Pain in left shoulder: Secondary | ICD-10-CM | POA: Diagnosis not present

## 2021-01-28 DIAGNOSIS — R29898 Other symptoms and signs involving the musculoskeletal system: Secondary | ICD-10-CM

## 2021-01-28 DIAGNOSIS — R293 Abnormal posture: Secondary | ICD-10-CM

## 2021-01-28 NOTE — Therapy (Signed)
[facility] [place], [place], [address] Phone: [phone]   Fax:  [phone]  Physical Therapy Treatment and Discharge Summary  Patient Details  Name: Melissa Rodgers MRN: [id number] Date of Birth: 1951-07-21 Referring Provider (PT): Aundria Mems, MD   Encounter Date: 01/28/2021   PT End of Session - 01/28/21 1352    Visit Number 14    Number of Visits 18    Date for PT Re-Evaluation 02/22/21    Authorization Type medicare    PT Start Time 1346    PT Stop Time 1430    PT Time Calculation (min) 44 min    Activity Tolerance Patient limited by pain;Patient tolerated treatment well    Behavior During Therapy [facility] for tasks assessed/performed           Past Medical History:  Diagnosis Date   Atrial fibrillation 06/18/2019   BMI 30.0-30.9,adult 01/27/2013   Candidosis of skin 04/17/2016   Formatting of this note might be different from the original. 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 01/29/15:  24 Hour Holter Monitor CONCLUSIONS: 1. Overall normal 24-hour Holter monitor recording, with rare, brief,  asymptomatic nonsustained supraventricular tachycardia as described.  2. No cardiac rhythm explanation for patient's symptoms.      Primary osteoarthritis of both knees 10/07/2016   Formatting of this note might be different from the original. 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   Primary osteoarthritis of right knee 11/21/2014   S/P laparoscopic fundoplication 6/76/1950   Scar condition and fibrosis of skin 04/17/2016   Formatting of this note might be different from the original. lower abdomen   Sebaceous hyperplasia 04/17/2016   Seborrheic keratoses 04/17/2016   Skin tags, multiple acquired 04/17/2016   Solar lentigo 04/17/2016   Subarachnoid hemorrhage following injury 2010   Fall from horse; brief hospitalization    Past Surgical History:  Procedure Laterality Date   ESOPHAGOGASTRODUODENOSCOPY     possibly last one was 2017. In [place] Right 93/26/7124   NISSEN FUNDOPLICATION  58/07/9832   OOPHORECTOMY     PARTIAL KNEE ARTHROPLASTY Left 04/05/2014    There were no vitals filed for this visit.   Subjective Assessment - 01/28/21 1350    Subjective The patient feels she is beginning to get stronger.  She had one episode of shoulder catching that radiates all over (she describes in the supraspinatus region) and this leads to soreness x days.  She feels like her exercises are going well.  She weeded on Saturday and has some soreness from use.    Patient Stated Goals Sleep comofortably and use the arm without increasing pain    Currently in Pain? Yes    Pain Score 4     Pain Location Shoulder    Pain Orientation Left;Right    Pain Descriptors / Indicators Discomfort;Sore    Pain Type Chronic pain    Pain Onset More than a month ago    Pain Frequency  Intermittent    Aggravating Factors  movement    Pain Relieving Factors neurontin              [facility] PT Assessment - 01/28/21 1355      Assessment   Medical Diagnosis Chronic L shoulder pain    Referring Provider (PT) Aundria Mems, MD    Onset Date/Surgical Date 09/13/20    Hand Dominance Left                         [facility] Adult PT Treatment/Exercise - 01/28/21 1355      Self-Care   Self-Care Other Self-Care Comments    Other Self-Care Comments  discussed post d/c plan and overall activity including walking, and general conditioning;  the patient has been walking iwth her sister.  Her low back and knee are painful at times and she has to modify activities.      Therapeutic Activites    Therapeutic Activities Other Therapeutic Activities    Other Therapeutic Activities reaching into cabinet for cup and then for 1 lb items without pain.      Exercises   Exercises Shoulder;Neck      Neck Exercises: Seated   Neck Retraction 5 reps    Neck Retraction Limitations with rotation    Cervical Rotation Right;Left;5 reps      Shoulder Exercises: Supine   Diagonals AROM;Left;5 reps    Diagonals Limitations with some catching type pain at end range (abduction)    Other Supine Exercises retraction increases bilateral pain      Shoulder Exercises: Standing   Flexion AROM;10 reps    Flexion Limitations reaching to cabinet    Other Standing Exercises elbow flexion x 2 lbs x 14 reps    Other Standing Exercises shoulder flexion with 1 lb reaching anteriorly x 10 reps R and L sides; overhead press with 1 lb weights x 10 reps      Shoulder Exercises: ROM/Strengthening   UBE (Upper Arm Bike) L2 x 2.5 minutes forward, 1 minute backward      Shoulder Exercises: Stretch   Corner Stretch Limitations door frame stretch for pec "W:                  PT Education - 01/28/21 1554    Education Details continue HEP, added bicep curls    Person(s) Educated Patient     Methods Explanation;Demonstration;Handout    Comprehension Verbalized understanding;Returned demonstration               PT Long Term Goals - 01/28/21 1416      PT LONG TERM GOAL #1   Title The patient will be indep with HEP.    Time 6    Period Weeks    Status Achieved      PT LONG TERM GOAL #2   Title The patient will reduce FOTO from 47% to 37% limitation.    Baseline 37%    Time 6    Period Weeks  Status Achieved      PT LONG TERM GOAL #3   Title The patient will demonstrate reaching to grap a cup off of low shelf of cabinet    Time 6    Period Weeks    Status Achieved      PT LONG TERM GOAL #4   Title The patient will report > 1 week without episode of severe pain    Baseline had 2 days this week with bilateral shoulder pain that was significant and limited activity    Time 6    Period Weeks    Status Partially Met      PT LONG TERM GOAL #5   Title The patient will verbalize return to walking program at home for general conditioning.    Time 6    Period Weeks    Status Achieved                 Plan - 01/28/21 1554    Clinical Impression Statement The patient has met LTGs.  She is continuing to tolerate increased strengthening.  She feels comfortable with progressing her HEP slowly, based on her tolerance to activity.  She has functional ROM bilaterally.  Overall, pain is decreased, however, she gets occasional sharp pain in parascapular region and some radiating numbness/tingling in C5-C6 dermatome.  She did not tolerate mechanical traction due to the pressure of the machine on her suboccipital region.  She does get relief of symptoms with manual traction.  Patient to f/u with MD for further assessment on 02/12/21.    PT Treatment/Interventions ADLs/Self Care Home Management;Taping;Dry needling;Manual techniques;Therapeutic activities;Therapeutic exercise;Electrical Stimulation;Moist Heat;Cryotherapy;Patient/family education;Passive range of  motion;Iontophoresis 86m/ml Dexamethasone    PT Next Visit Plan discharge    PT Home Exercise Plan Access Code: [email or web address]   Consulted and Agree with Plan of Care Patient           Patient will benefit from skilled therapeutic intervention in order to improve the following deficits and impairments:     Visit Diagnosis: Chronic left shoulder pain  Abnormal posture  Other symptoms and signs involving the musculoskeletal system     Problem List Patient Active Problem List   Diagnosis Date Noted   DDD (degenerative disc disease), cervical 12/03/2020   Severe pain of left shoulder 11/05/2020   Fibromyalgia 03/05/2020   Knee pain, acute 03/05/2020   Osteoarthritis 03/05/2020   Diverticulitis of sigmoid colon 08/01/2019   Generalized anxiety disorder with panic attacks 08/01/2019   Unspecified atrial fibrillation 06/18/2019   Major depressive disorder 09/09/2018   Dry eye syndrome of both eyes 09/09/2018   Microscopic hematuria 09/09/2018   Non-seasonal allergic rhinitis 09/09/2018   Hyperlipidemia 09/09/2018   Essential hypertension 09/09/2018   History of palpitations 09/09/2018   Chronic bilateral low back pain without sciatica 10/07/2016   Chronic bilateral upper abdominal pain 10/07/2016   Primary osteoarthritis of both knees post bilateral hemiarthroplasty 10/07/2016   Candidosis of skin 04/17/2016   Cherry angioma 04/17/2016   Dry skin 04/17/2016   Dermal nevus of left forearm 04/17/2016   Multiple benign nevi 04/17/2016   Scar condition and fibrosis of skin 04/17/2016   Sebaceous hyperplasia 04/17/2016   Seborrheic keratoses 04/17/2016   Skin tags, multiple acquired 04/17/2016   Solar lentigo 04/17/2016   S/P laparoscopic fundoplication 041/74/0814  Gastroesophageal reflux disease without esophagitis 11/06/2015   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 She gets occasional "catching" sensation in L shoulder N/T in L forearm (radial side) UE weakness   Education / Equipment: HEP  Plan: Patient agrees to discharge.  Patient goals were met. Patient is being discharged due to meeting the stated rehab goals.  ?????        Thank you for the referral of this patient. Rudell Cobb, MPT  [place], [place] 01/28/2021, 4:06 PM  [facility] [place], [place], [address] Phone: [phone]   Fax:  [phone]  Name: Paulene Tayag MRN: [id number] Date of Birth: September 05, 1951

## 2021-02-04 ENCOUNTER — Encounter: Payer: Medicare Other | Admitting: Rehabilitative and Restorative Service Providers"

## 2021-02-12 ENCOUNTER — Telehealth: Payer: Self-pay | Admitting: Sports Medicine

## 2021-02-12 ENCOUNTER — Ambulatory Visit (INDEPENDENT_AMBULATORY_CARE_PROVIDER_SITE_OTHER): Payer: Medicare Other | Admitting: Sports Medicine

## 2021-02-12 ENCOUNTER — Other Ambulatory Visit: Payer: Self-pay

## 2021-02-12 ENCOUNTER — Ambulatory Visit (INDEPENDENT_AMBULATORY_CARE_PROVIDER_SITE_OTHER): Payer: Medicare Other

## 2021-02-12 DIAGNOSIS — M5030 Other cervical disc degeneration, unspecified cervical region: Secondary | ICD-10-CM

## 2021-02-12 DIAGNOSIS — M170 Bilateral primary osteoarthritis of knee: Secondary | ICD-10-CM

## 2021-02-12 DIAGNOSIS — M25512 Pain in left shoulder: Secondary | ICD-10-CM

## 2021-02-12 NOTE — Assessment & Plan Note (Signed)
Melissa Rodgers is also post hemiarthroplasty approximately 8 years ago. This was done by a Psychologist, sport and exercise in [place]. She continues to have chronic pain in her left knee, this was injected sometime ago without much improvement. She also had a bone scan a few years ago that showed potential loosening of the arthroplasty components, she saw a Psychologist, sport and exercise with emerge who suggested revision arthroplasty. I would like a second opinion from Dr. Berenice Primas, we will also go ahead and work on getting her approved for viscosupplementation in the hopes that this could provide some relief before taking the leap of revision arthroplasty.

## 2021-02-12 NOTE — Assessment & Plan Note (Signed)
Did really well with physical therapy, she did have an injection back in December. She is having a bit of recurrence of discomfort, signs are impingement today. Repeat subacromial injection. She does have atrial fibrillation, occasionally with steroids and stimulants she will go into atrial tachycardia and atrial fibrillation. She will take her diltiazem tonight.

## 2021-02-12 NOTE — Assessment & Plan Note (Signed)
Melissa Rodgers is overall doing pretty well with regards to her neck, gabapentin at 2-3 tabs daily has provided excellent relief of her burning paresthesias, she has done some physical therapy, prednisone.  Tramadol also efficacious. I think we can stay the course and not proceed to interventional treatment.

## 2021-02-12 NOTE — Progress Notes (Signed)
    Procedures performed today:    Procedure: Real-time Ultrasound Guided injection of the left subacromial bursa Device: Samsung HS60  Verbal informed consent obtained.  Time-out conducted.  Noted no overlying erythema, induration, or other signs of local infection.  Skin prepped in a sterile fashion.  Local anesthesia: Topical Ethyl chloride.  With sterile technique and under real time ultrasound guidance:  Noted intact rotator cuff, 1 cc Kenalog 40, 1 cc lidocaine, 1 cc bupivacaine injected easily Completed without difficulty  Advised to call if fevers/chills, erythema, induration, drainage, or persistent bleeding.  Images permanently stored and available for review in PACS.  Impression: Technically successful ultrasound guided injection.  Independent interpretation of notes and tests performed by another provider:   None.  Brief History, Exam, Impression, and Recommendations:    DDD (degenerative disc disease), cervical Faten is overall doing pretty well with regards to her neck, gabapentin at 2-3 tabs daily has provided excellent relief of her burning paresthesias, she has done some physical therapy, prednisone.  Tramadol also efficacious. I think we can stay the course and not proceed to interventional treatment.  Severe pain of left shoulder Did really well with physical therapy, she did have an injection back in December. She is having a bit of recurrence of discomfort, signs are impingement today. Repeat subacromial injection. She does have atrial fibrillation, occasionally with steroids and stimulants she will go into atrial tachycardia and atrial fibrillation. She will take her diltiazem tonight.  Primary osteoarthritis of both knees post bilateral hemiarthroplasty Santiago Glad is also post hemiarthroplasty approximately 8 years ago. This was done by a Psychologist, sport and exercise in [place]. She continues to have chronic pain in her left knee, this was injected sometime ago without much  improvement. She also had a bone scan a few years ago that showed potential loosening of the arthroplasty components, she saw a Psychologist, sport and exercise with emerge who suggested revision arthroplasty. I would like a second opinion from Dr. Berenice Primas, we will also go ahead and work on getting her approved for viscosupplementation in the hopes that this could provide some relief before taking the leap of revision arthroplasty.    ___________________________________________ [name], M.D., ABFM., CAQSM. Primary Care and [place] Instructor of [place] of [facility] of Medicine

## 2021-02-12 NOTE — Telephone Encounter (Signed)
X-ray confirmed bilateral osteoarthritis, please work on bilateral viscosupplementation approval.

## 2021-02-17 ENCOUNTER — Encounter: Payer: Self-pay | Admitting: Osteopathic Medicine

## 2021-02-18 NOTE — Telephone Encounter (Signed)
Started Orthovisc process waiting on BID and to see if PA is required. - CF 

## 2021-03-18 DIAGNOSIS — Z23 Encounter for immunization: Secondary | ICD-10-CM | POA: Diagnosis not present

## 2021-03-19 DIAGNOSIS — M25562 Pain in left knee: Secondary | ICD-10-CM | POA: Diagnosis not present

## 2021-03-19 DIAGNOSIS — M25561 Pain in right knee: Secondary | ICD-10-CM | POA: Diagnosis not present

## 2021-03-26 ENCOUNTER — Other Ambulatory Visit: Payer: Self-pay

## 2021-03-26 ENCOUNTER — Encounter: Payer: Self-pay | Admitting: Sports Medicine

## 2021-03-26 ENCOUNTER — Ambulatory Visit (INDEPENDENT_AMBULATORY_CARE_PROVIDER_SITE_OTHER): Payer: Medicare Other | Admitting: Sports Medicine

## 2021-03-26 DIAGNOSIS — M25512 Pain in left shoulder: Secondary | ICD-10-CM | POA: Diagnosis not present

## 2021-03-26 DIAGNOSIS — M170 Bilateral primary osteoarthritis of knee: Secondary | ICD-10-CM | POA: Diagnosis not present

## 2021-03-26 NOTE — Telephone Encounter (Signed)
This encounter was created in error - please disregard.

## 2021-03-26 NOTE — Telephone Encounter (Signed)
Patient is going to proceed with knee replacement, no further discussion of Orthovisc needed.

## 2021-03-26 NOTE — Progress Notes (Signed)
    Procedures performed today:    None.  Independent interpretation of notes and tests performed by another provider:   None.  Brief History, Exam, Impression, and Recommendations:    Severe pain of left shoulder Melissa Rodgers returns, she is a pleasant 70 year old female, we did an injection into her subacromial bursa back and late March, she did extremely well and is pain-free. She does have a history of atrial fibrillation, and historically steroids and stimulants have put her into atrial tachycardia and A. fib, she is currently on flecainide, beta-blocker and she does have diltiazem to use as needed. She was able to use an extra few doses of diltiazem which seem to keep her heart rate under control and it sounds like she had some more anxiety than anything else. If we have to do another injection she will do the extra doses of diltiazem and I will probably add some Klonopin as well for a night or 2. Return as needed.  Primary osteoarthritis of both knees post bilateral hemiarthroplasty Melissa Rodgers also has bilateral hemiarthroplasties of her knees done approximately 8 years ago. This was done by a Psychologist, sport and exercise in [place]. With persistent pain we ultimately obtained a bone scan that showed potential loosening of the arthroplasty components. Dr. Berenice Primas has seen her as well and has recommended revision arthroplasty with conversion to total. I think this is a good plan and we can hold off on any further discussion of Orthovisc. Return to see me as needed for this.    ___________________________________________ [name], M.D., ABFM., CAQSM. Primary Care and [place] Instructor of [place] of [facility] of Medicine

## 2021-03-26 NOTE — Assessment & Plan Note (Signed)
Modesty also has bilateral hemiarthroplasties of her knees done approximately 8 years ago. This was done by a Psychologist, sport and exercise in [place]. With persistent pain we ultimately obtained a bone scan that showed potential loosening of the arthroplasty components. Dr. Berenice Primas has seen her as well and has recommended revision arthroplasty with conversion to total. I think this is a good plan and we can hold off on any further discussion of Orthovisc. Return to see me as needed for this.

## 2021-03-26 NOTE — Assessment & Plan Note (Signed)
Melissa Rodgers returns, she is a pleasant 70 year old female, we did an injection into her subacromial bursa back and late March, she did extremely well and is pain-free. She does have a history of atrial fibrillation, and historically steroids and stimulants have put her into atrial tachycardia and A. fib, she is currently on flecainide, beta-blocker and she does have diltiazem to use as needed. She was able to use an extra few doses of diltiazem which seem to keep her heart rate under control and it sounds like she had some more anxiety than anything else. If we have to do another injection she will do the extra doses of diltiazem and I will probably add some Klonopin as well for a night or 2. Return as needed.

## 2021-03-29 DIAGNOSIS — L821 Other seborrheic keratosis: Secondary | ICD-10-CM | POA: Diagnosis not present

## 2021-03-31 ENCOUNTER — Other Ambulatory Visit: Payer: Self-pay | Admitting: Cardiology

## 2021-04-01 NOTE — Telephone Encounter (Signed)
73f, 83.5kg, scr 0.63 09/13/20, lovw/munley 12/24/20

## 2021-04-09 ENCOUNTER — Other Ambulatory Visit: Payer: Self-pay | Admitting: Orthopedic Surgery

## 2021-04-09 DIAGNOSIS — Z01811 Encounter for preprocedural respiratory examination: Secondary | ICD-10-CM

## 2021-04-16 ENCOUNTER — Other Ambulatory Visit: Payer: Self-pay

## 2021-04-16 ENCOUNTER — Encounter: Payer: Self-pay | Admitting: Osteopathic Medicine

## 2021-04-16 ENCOUNTER — Ambulatory Visit (INDEPENDENT_AMBULATORY_CARE_PROVIDER_SITE_OTHER): Payer: Medicare Other | Admitting: Osteopathic Medicine

## 2021-04-16 DIAGNOSIS — Z1231 Encounter for screening mammogram for malignant neoplasm of breast: Secondary | ICD-10-CM

## 2021-04-16 DIAGNOSIS — Z0000 Encounter for general adult medical examination without abnormal findings: Secondary | ICD-10-CM

## 2021-04-16 DIAGNOSIS — M170 Bilateral primary osteoarthritis of knee: Secondary | ICD-10-CM

## 2021-04-16 DIAGNOSIS — Z780 Asymptomatic menopausal state: Secondary | ICD-10-CM | POA: Diagnosis not present

## 2021-04-16 NOTE — Progress Notes (Signed)
MEDICARE ANNUAL WELLNESS VISIT  04/16/2021  Telephone Visit Disclaimer This Medicare AWV was conducted by telephone due to national recommendations for restrictions regarding the COVID-19 Pandemic (e.g. social distancing).  I verified, using two identifiers, that I am speaking with Melissa Rodgers or their authorized healthcare agent. I discussed the limitations, risks, security, and privacy concerns of performing an evaluation and management service by telephone and the potential availability of an in-person appointment in the future. The patient expressed understanding and agreed to proceed.  Location of Patient: Home Location of Provider (nurse):  In the office.  Subjective:    Melissa Rodgers is a 70 y.o. female patient of Melissa Reeve, DO who had a Medicare Annual Wellness Visit today via telephone. Melissa Rodgers is Retired and lives with their spouse. she has zero children. she reports that she is socially active and does interact with friends/family regularly. she is minimally physically active and enjoys gardening, sewing, [place], crafts and cooking. .  Patient Care Team: Melissa Reeve, DO as PCP - General (Osteopathic Medicine) Richardo Priest, MD as PCP - Cardiology (Cardiology) Silverio Decamp, MD as Consulting Physician (Family Medicine)  Advanced Directives 04/16/2021 07/07/2019  Does Patient Have a Medical Advance Directive? No Yes  Type of Advance Directive - [place]  Would patient like information on creating a medical advance directive? No - Patient declined [facility] Utilization Over the Past 12 Months: # of hospitalizations or ER visits: 0 # of surgeries: 0  Review of Systems    Patient reports that her overall health is better compared to last year.  History obtained from chart review and the patient  Patient Reported Readings (BP, Pulse, CBG, Weight, etc) none  Pain Assessment Pain : No/denies pain     Current  Medications & Allergies (verified) Allergies as of 04/16/2021      Reactions   Rosuvastatin Other (See Comments)   Other reaction(s): Musculoskeletal Aches Muscle spasms   Sulfa Antibiotics Rash, Other (See Comments)      Medication List       Accurate as of Apr 16, 2021 11:34 AM. If you have any questions, ask your nurse or doctor.        B-12 PO Take by mouth daily.   Cetirizine HCl 10 MG Caps Take 10 mg by mouth daily.   diclofenac Sodium 1 % Gel Commonly known as: VOLTAREN Place onto the skin as needed.   diltiazem 30 MG tablet Commonly known as: Cardizem Take 1 tablet (30 mg total) by mouth every 4 (four) hours as needed.   Eliquis 5 MG Tabs tablet Generic drug: apixaban TAKE 1 TABLET TWICE A DAY   flecainide 50 MG tablet Commonly known as: TAMBOCOR TAKE 1 TABLET(50 MG) BY MOUTH TWICE DAILY   FLUoxetine 10 MG tablet Commonly known as: PROZAC   gabapentin 300 MG capsule Commonly known as: NEURONTIN Take 300 mg by mouth 2 (two) times daily.   losartan 50 MG tablet Commonly known as: COZAAR Take 25 mg by mouth daily.   LYSINE PO Take 1 tablet by mouth daily as needed.   metoprolol succinate 25 MG 24 hr tablet Commonly known as: TOPROL-XL TAKE 1 TABLET(25 MG) BY MOUTH DAILY   traMADol 50 MG tablet Commonly known as: ULTRAM Take 1-2 tablets (50-100 mg total) by mouth every 8 (eight) hours as needed for moderate pain. Maximum 6 tabs per day.   Vitamin D-3 25 MCG (1000 UT) Caps Take 1 capsule by  mouth daily.       History (reviewed): Past Medical History:  Diagnosis Date  . Atrial fibrillation 06/18/2019  . BMI 30.0-30.9,adult 01/27/2013  . Candidosis of skin 04/17/2016   Formatting of this note might be different from the original. lower abdomen  . Cherry angioma 04/17/2016  . Chronic bilateral low back pain without sciatica 10/07/2016  . 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  Marland Kitchen Dermal nevus of left forearm 04/17/2016  . Diverticulitis of sigmoid colon 08/01/2019  . Dry eye syndrome of both eyes 09/09/2018  . Dry skin 04/17/2016  . Essential hypertension 09/09/2018  . Fibromyalgia   . Gastroesophageal reflux disease without esophagitis 11/06/2015  . Generalized anxiety disorder with panic attacks 08/01/2019  . History of palpitations 09/09/2018  . Hyperlipidemia 09/09/2018  . 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  . Knee pain, acute 03/05/2020  . 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 Major depressive disorder 09/09/2018  . Microscopic hematuria 09/09/2018  . Multiple benign nevi 04/17/2016  . Non-seasonal allergic rhinitis 09/09/2018  . Osteoarthritis   . Palpitations 09/28/2013   Formatting of this note is different from the original. 01/29/15:  24 Hour Holter Monitor CONCLUSIONS: 1. Overall normal 24-hour Holter monitor recording, with rare, brief,  asymptomatic nonsustained supraventricular tachycardia as described.  2. No cardiac rhythm explanation for patient's symptoms.     . Primary osteoarthritis of both knees 10/07/2016   Formatting of this note might be different from the original. 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  Marland Kitchen Primary osteoarthritis of right knee 11/21/2014  . S/P laparoscopic fundoplication 03/25/7740  . Scar condition and fibrosis of skin 04/17/2016   Formatting of this note might be different from the original. lower abdomen  . Sebaceous hyperplasia 04/17/2016  . Seborrheic keratoses 04/17/2016  . Skin tags, multiple acquired 04/17/2016  . Solar lentigo 04/17/2016  . Subarachnoid hemorrhage following injury 2010   Fall from horse; brief hospitalization   Past Surgical History:  Procedure Laterality Date  . ESOPHAGOGASTRODUODENOSCOPY     possibly last one was 2017. In [place]  . HERNIA REPAIR    . MEDIAL PARTIAL KNEE REPLACEMENT Right 12/14/2014  . NISSEN FUNDOPLICATION  28/78/6767  . OOPHORECTOMY    . PARTIAL  KNEE ARTHROPLASTY Left 04/05/2014   Family History  Problem Relation Age of Onset  . Diabetes Mother   . Heart disease Mother   . Alzheimer's disease Father        Symptom onset in late 37s  . Atrial fibrillation Sister   . Tremor Other        Significant maternal history of tremor; unknown regarding Parkinson's disease diagnoses  . Colon cancer Neg Hx   . Esophageal cancer Neg Hx    Social History   Socioeconomic History  . Marital status: Married    Spouse name: Kirtland Bouchard  . Number of children: 0  . Years of education: 60  . Highest education level: Master's degree (e.g., MA, MS, MEng, MEd, MSW, MBA)  Occupational History  . Occupation: Retired  Tobacco Use  . Smoking status: Former Smoker    Types: Cigarettes    Quit date: 08/18/1983    Years since quitting: 37.6  . Smokeless tobacco: Never Used  Vaping Use  . Vaping Use: Never used  Substance and Sexual Activity  . Alcohol use: Yes    Alcohol/week: 7.0 - 14.0 standard drinks    Types: 7 -  14 Glasses of wine per week    Comment: 1-2 drinks per night, usually wine  . Drug use: Never  . Sexual activity: Yes    Partners: Male    Birth control/protection: None  Other Topics Concern  . Not on file  Social History Narrative   Lives with her husband. Stays active and has a lot of hobbies such as gardening, cooking, reading and doing crafts.   Social Determinants of Health   Financial Resource Strain: Low Risk   . Difficulty of Paying Living Expenses: Not hard at all  Food Insecurity: No Food Insecurity  . Worried About Charity fundraiser in the Last Year: Never true  . Ran Out of Food in the Last Year: Never true  Transportation Needs: No Transportation Needs  . Lack of Transportation (Medical): No  . Lack of Transportation (Non-Medical): No  Physical Activity: Inactive  . Days of Exercise per Week: 0 days  . Minutes of Exercise per Session: 0 min  Stress: No Stress Concern Present  . Feeling of Stress :  Not at all  Social Connections: Moderately Isolated  . Frequency of Communication with Friends and Family: More than three times a week  . Frequency of Social Gatherings with Friends and Family: Once a week  . Attends Religious Services: Never  . Active Member of Clubs or Organizations: No  . Attends Archivist Meetings: Never  . Marital Status: Married    Activities of Daily Living In your present state of health, do you have any difficulty performing the following activities: 04/16/2021  Hearing? N  Vision? N  Difficulty concentrating or making decisions? N  Walking or climbing stairs? Y  Comment Patient has a lot of pain and swelling but is still able to walk and climb stairs.  Dressing or bathing? N  Doing errands, shopping? N  Preparing Food and eating ? N  Using the Toilet? N  In the past six months, have you accidently leaked urine? Y  Comment has had a couple of episodes.  Do you have problems with loss of bowel control? N  Managing your Medications? N  Managing your Finances? N  Housekeeping or managing your Housekeeping? N  Some recent data might be hidden    Patient Education/ Literacy How often do you need to have someone help you when you read instructions, pamphlets, or other written materials from your doctor or pharmacy?: 1 - Never What is the last grade level you completed in school?: two masters degrees  Exercise Current Exercise Habits: Home exercise routine, Type of exercise: walking, Time (Minutes): 20, Frequency (Times/Week): 3, Weekly Exercise (Minutes/Week): 60, Intensity: Mild, Exercise limited by: orthopedic condition(s)  Diet Patient reports consuming 3 meals a day and 3 snack(s) a day Patient reports that her primary diet is: Regular Patient reports that she does have regular access to food.   Depression Screen PHQ 2/9 Scores 04/16/2021 02/15/2020 08/29/2019 03/11/2019 09/09/2018  PHQ - 2 Score 0 1 0 1 2  PHQ- 9 Score 2 6 5  - 9     Fall  Risk Fall Risk  04/16/2021 02/15/2020  Falls in the past year? 0 1  Number falls in past yr: 0 0  Injury with Fall? 0 1  Risk for fall due to : No Fall Risks -  Follow up Falls evaluation completed -     Objective:  Khadeeja Elden seemed alert and oriented and she participated appropriately during our telephone visit.  Blood Pressure Weight  BMI  BP Readings from Last 3 Encounters:  12/24/20 130/82  09/13/20 134/81  09/11/20 136/84   Wt Readings from Last 3 Encounters:  12/24/20 184 lb 1.9 oz (83.5 kg)  09/13/20 184 lb 0.6 oz (83.5 kg)  09/11/20 185 lb 1.3 oz (84 kg)   BMI Readings from Last 1 Encounters:  12/24/20 32.62 kg/m    *Unable to obtain current vital signs, weight, and BMI due to telephone visit type  Hearing/Vision  . Holly did not seem to have difficulty with hearing/understanding during the telephone conversation . Reports that she has had a formal eye exam by an eye care professional within the past year . Reports that she has not had a formal hearing evaluation within the past year *Unable to fully assess hearing and vision during telephone visit type  Cognitive Function: 6CIT Screen 04/16/2021  What Year? 0 points  What month? 0 points  What time? 0 points  Count back from 20 0 points  Months in reverse 0 points  Repeat phrase 0 points  Total Score 0   (Normal:0-7, Significant for Dysfunction: >8)  Normal Cognitive Function Screening: Yes   Immunization & Health Maintenance Record Immunization History  Administered Date(s) Administered  . Fluad Quad(high Dose 65+) 08/29/2019  . Influenza Whole 09/13/2014  . Influenza, High Dose Seasonal PF 10/07/2016, 09/15/2017, 09/09/2018  . Influenza,inj,Quad PF,6+ Mos 09/26/2015  . Influenza,inj,quad, With Preservative 09/09/2013  . Influenza-Unspecified 09/06/2020  . PFIZER(Purple Top)SARS-COV-2 Vaccination 12/16/2019, 01/05/2020, 08/23/2020, 03/18/2021  . Pneumococcal Conjugate-13 08/10/2015  . Pneumococcal  Polysaccharide-23 10/14/2013  . Tdap 03/20/2015  . Zoster 01/27/2013    Health Maintenance  Topic Date Due  . DEXA SCAN  04/16/2022 (Originally 06/22/2015)  . Hepatitis C Screening  04/16/2022 (Originally 06/21/1969)  . PNA vac Low Risk Adult (2 of 2 - PPSV23) 04/16/2022 (Originally 10/14/2018)  . INFLUENZA VACCINE  06/24/2021  . MAMMOGRAM  08/24/2021  . COLONOSCOPY (Pts 45-85yrs Insurance coverage will need to be confirmed)  10/03/2024  . TETANUS/TDAP  03/19/2025  . COVID-19 Vaccine  Completed  . HPV VACCINES  Aged Out       Assessment  This is a routine wellness examination for [place].  Health Maintenance: Due or Overdue There are no preventive care reminders to display for this patient.  Melissa Rodgers does not need a referral for Community Assistance: Care Management:   no Social Work:    no Prescription Assistance:  no Nutrition/Diabetes Education:  no   Plan:  Personalized Goals Goals Addressed              This Visit's Progress   .  Patient Stated (pt-stated)        04/16/2021 AWV Goal: Exercise for General Health   Patient will verbalize understanding of the benefits of increased physical activity:  Exercising regularly is important. It will improve your overall fitness, flexibility, and endurance.  Regular exercise also will improve your overall health. It can help you control your weight, reduce stress, and improve your bone density.  Over the next year, patient will increase physical activity as tolerated with a goal of at least 150 minutes of moderate physical activity per week.   You can tell that you are exercising at a moderate intensity if your heart starts beating faster and you start breathing faster but can still hold a conversation.  Moderate-intensity exercise ideas include:  Walking 1 mile (1.6 km) in about 15 minutes  Paramedic aerobics  Patient will verbalize understanding of everyday  activities that increase physical activity by providing examples like the following: ? Yard work, such as: ? Pushing a Conservation officer, nature ? Raking and bagging leaves ? Washing your car ? Pushing a stroller ? Shoveling snow ? Gardening ? Washing windows or floors  Patient will be able to explain general safety guidelines for exercising:   Before you start a new exercise program, talk with your health care provider.  Do not exercise so much that you hurt yourself, feel dizzy, or get very short of breath.  Wear comfortable clothes and wear shoes with good support.  Drink plenty of water while you exercise to prevent dehydration or heat stroke.  Work out until your breathing and your heartbeat get faster.       Personalized Health Maintenance & Screening Recommendations  Pneumococcal vaccine  Screening mammography Bone densitometry screening  Shingrix   Lung Cancer Screening Recommended: no (Low Dose CT Chest recommended if Age 40-80 years, 30 pack-year currently smoking OR have quit w/in past 15 years) Hepatitis C Screening recommended: yes HIV Screening recommended: yes  Advanced Directives: Written information was not prepared per patient's request.  Referrals & Orders Orders Placed This Encounter  Procedures  . DEXAScan  . Mammogram 3D SCREEN BREAST BILATERAL    Follow-up Plan . Follow-up with Melissa Reeve, DO as planned . Schedule your shingrix vaccine at your pharmacy.  . Mammogram and Dexa scan referral has been sent. . Medicare wellness in one year.   I have personally reviewed and noted the following in the patient's chart:   . Medical and social history . Use of alcohol, tobacco or illicit drugs  . Current medications and supplements . Functional ability and status . Nutritional status . Physical activity . Advanced directives . List of other physicians . Hospitalizations, surgeries, and ER visits in previous 12 months . Vitals . Screenings to include  cognitive, depression, and falls . Referrals and appointments  In addition, I have reviewed and discussed with Melissa Rodgers certain preventive protocols, quality metrics, and best practice recommendations. A written personalized care plan for preventive services as well as general preventive health recommendations is available and can be mailed to the patient at her request.      Tinnie Gens, RN  04/16/2021

## 2021-04-16 NOTE — Patient Instructions (Addendum)
[place] Maintenance Summary and Written Plan of Care  Melissa Rodgers ,  Thank you for allowing me to perform your Medicare Annual Wellness Visit and for your ongoing commitment to your health.   Health Maintenance & Immunization History Health Maintenance  Topic Date Due  . DEXA SCAN  04/16/2022 (Originally 06/22/2015)  . Hepatitis C Screening  04/16/2022 (Originally 06/21/1969)  . PNA vac Low Risk Adult (2 of 2 - PPSV23) 04/16/2022 (Originally 10/14/2018)  . INFLUENZA VACCINE  06/24/2021  . MAMMOGRAM  08/24/2021  . COLONOSCOPY (Pts 45-52yrs Insurance coverage will need to be confirmed)  10/03/2024  . TETANUS/TDAP  03/19/2025  . COVID-19 Vaccine  Completed  . HPV VACCINES  Aged Out   Immunization History  Administered Date(s) Administered  . Fluad Quad(high Dose 65+) 08/29/2019  . Influenza Whole 09/13/2014  . Influenza, High Dose Seasonal PF 10/07/2016, 09/15/2017, 09/09/2018  . Influenza,inj,Quad PF,6+ Mos 09/26/2015  . Influenza,inj,quad, With Preservative 09/09/2013  . Influenza-Unspecified 09/06/2020  . PFIZER(Purple Top)SARS-COV-2 Vaccination 12/16/2019, 01/05/2020, 08/23/2020, 03/18/2021  . Pneumococcal Conjugate-13 08/10/2015  . Pneumococcal Polysaccharide-23 10/14/2013  . Tdap 03/20/2015  . Zoster 01/27/2013    These are the patient goals that we discussed: Goals Addressed              This Visit's Progress   .  Patient Stated (pt-stated)        04/16/2021 AWV Goal: Exercise for General Health   Patient will verbalize understanding of the benefits of increased physical activity:  Exercising regularly is important. It will improve your overall fitness, flexibility, and endurance.  Regular exercise also will improve your overall health. It can help you control your weight, reduce stress, and improve your bone density.  Over the next year, patient will increase physical activity as tolerated with a goal of at least 150 minutes of  moderate physical activity per week.   You can tell that you are exercising at a moderate intensity if your heart starts beating faster and you start breathing faster but can still hold a conversation.  Moderate-intensity exercise ideas include:  Walking 1 mile (1.6 km) in about 15 minutes  Biking  Hiking  Golfing  Dancing  Water aerobics  Patient will verbalize understanding of everyday activities that increase physical activity by providing examples like the following: ? Yard work, such as: ? Pushing a Conservation officer, nature ? Raking and bagging leaves ? Washing your car ? Pushing a stroller ? Shoveling snow ? Gardening ? Washing windows or floors  Patient will be able to explain general safety guidelines for exercising:   Before you start a new exercise program, talk with your health care provider.  Do not exercise so much that you hurt yourself, feel dizzy, or get very short of breath.  Wear comfortable clothes and wear shoes with good support.  Drink plenty of water while you exercise to prevent dehydration or heat stroke.  Work out until your breathing and your heartbeat get faster.         This is a list of Health Maintenance Items that are overdue or due now: Pneumococcal vaccine  Screening mammography Bone densitometry screening  Shingrix   Orders/Referrals Placed Today: Orders Placed This Encounter  Procedures  . DEXAScan    Standing Status:   Future    Standing Expiration Date:   04/16/2022    Scheduling Instructions:     Please call patient to schedule    Order Specific Question:   Reason for exam:  Answer:   Post Menopausal    Order Specific Question:   Preferred imaging location?    Answer:   [facility]  . Mammogram 3D SCREEN BREAST BILATERAL    Standing Status:   Future    Standing Expiration Date:   04/16/2022    Scheduling Instructions:     Please call patient to schedule.    Order Specific Question:   Reason for Exam (SYMPTOM  OR  DIAGNOSIS REQUIRED)    Answer:   Breast cancer screening    Order Specific Question:   Preferred imaging location?    Answer:   [facility]   (Contact our referral department at [phone] if you have not spoken with someone about your referral appointment within the next 5 days)    Follow-up Plan . Follow-up with [name], DO as planned . Schedule your shingrix vaccine at your pharmacy.  . Mammogram and Dexa scan referral has been sent. . Medicare wellness in one year.   Bone Density Test A bone density test uses a type of X-ray to measure the amount of calcium and other minerals in a person's bones. It can measure bone density in the hip and the spine. The test is similar to having a regular X-ray. This test may also be called:  Bone densitometry.  Bone mineral density test.  Dual-energy X-ray absorptiometry (DEXA). You may have this test to:  Diagnose a condition that causes weak or thin bones (osteoporosis).  Screen you for osteoporosis.  Predict your risk for a broken bone (fracture).  Determine how well your osteoporosis treatment is working. Tell a health care provider about:  Any allergies you have.  All medicines you are taking, including vitamins, herbs, eye drops, creams, and over-the-counter medicines.  Any problems you or family members have had with anesthetic medicines.  Any blood disorders you have.  Any surgeries you have had.  Any medical conditions you have.  Whether you are pregnant or may be pregnant.  Any medical tests you have had within the past 14 days that used contrast material. What are the risks? Generally, this is a safe test. However, it does expose you to a small amount of radiation, which can slightly increase your cancer risk. What happens before the test?  Do not take any calcium supplements within the 24 hours before your test.  You will need to remove all metal jewelry, eyeglasses, removable dental  appliances, and any other metal objects on your body. What happens during the test?  You will lie down on an exam table. There will be an X-ray generator below you and an imaging device above you.  Other devices, such as boxes or braces, may be used to position your body properly for the scan.  The machine will slowly scan your body. You will need to keep very still while the machine does the scan.  The images will show up on a screen in the room. Images will be examined by a specialist after your test is finished. The procedure may vary among health care providers and hospitals.   What can I expect after the test? It is up to you to get the results of your test. Ask your health care provider, or the department that is doing the test, when your results will be ready. Summary  A bone density test is an imaging test that uses a type of X-ray to measure the amount of calcium and other minerals in your bones.  The test may be used  to diagnose or screen you for a condition that causes weak or thin bones (osteoporosis), predict your risk for a broken bone (fracture), or determine how well your osteoporosis treatment is working.  Do not take any calcium supplements within 24 hours before your test.  Ask your health care provider, or the department that is doing the test, when your results will be ready. This information is not intended to replace advice given to you by your health care provider. Make sure you discuss any questions you have with your health care provider. Document Revised: 04/26/2020 Document Reviewed: 04/26/2020 Elsevier Patient Education  [place] Maintenance, Female Adopting a healthy lifestyle and getting preventive care are important in promoting health and wellness. Ask your health care provider about:  The right schedule for you to have regular tests and exams.  Things you can do on your own to prevent diseases and keep yourself healthy. What  should I know about diet, weight, and exercise? Eat a healthy diet  Eat a diet that includes plenty of vegetables, fruits, low-fat dairy products, and lean protein.  Do not eat a lot of foods that are high in solid fats, added sugars, or sodium.   Maintain a healthy weight Body mass index (BMI) is used to identify weight problems. It estimates body fat based on height and weight. Your health care provider can help determine your BMI and help you achieve or maintain a healthy weight. Get regular exercise Get regular exercise. This is one of the most important things you can do for your health. Most adults should:  Exercise for at least 150 minutes each week. The exercise should increase your heart rate and make you sweat (moderate-intensity exercise).  Do strengthening exercises at least twice a week. This is in addition to the moderate-intensity exercise.  Spend less time sitting. Even light physical activity can be beneficial. Watch cholesterol and blood lipids Have your blood tested for lipids and cholesterol at 70 years of age, then have this test every 5 years. Have your cholesterol levels checked more often if:  Your lipid or cholesterol levels are high.  You are older than 71 years of age.  You are at high risk for heart disease. What should I know about cancer screening? Depending on your health history and family history, you may need to have cancer screening at various ages. This may include screening for:  Breast cancer.  Cervical cancer.  Colorectal cancer.  Skin cancer.  Lung cancer. What should I know about heart disease, diabetes, and high blood pressure? Blood pressure and heart disease  High blood pressure causes heart disease and increases the risk of stroke. This is more likely to develop in people who have high blood pressure readings, are of African descent, or are overweight.  Have your blood pressure checked: ? Every 3-5 years if you are 18-39 years of  age. ? Every year if you are 14 years old or older. Diabetes Have regular diabetes screenings. This checks your fasting blood sugar level. Have the screening done:  Once every three years after age 73 if you are at a normal weight and have a low risk for diabetes.  More often and at a younger age if you are overweight or have a high risk for diabetes. What should I know about preventing infection? Hepatitis B If you have a higher risk for hepatitis B, you should be screened for this virus. Talk with your health care provider to find out  if you are at risk for hepatitis B infection. Hepatitis C Testing is recommended for:  Everyone born from [birth year] through 1966.  Anyone with known risk factors for hepatitis C. Sexually transmitted infections (STIs)  Get screened for STIs, including gonorrhea and chlamydia, if: ? You are sexually active and are younger than 71 years of age. ? You are older than 71 years of age and your health care provider tells you that you are at risk for this type of infection. ? Your sexual activity has changed since you were last screened, and you are at increased risk for chlamydia or gonorrhea. Ask your health care provider if you are at risk.  Ask your health care provider about whether you are at high risk for HIV. Your health care provider may recommend a prescription medicine to help prevent HIV infection. If you choose to take medicine to prevent HIV, you should first get tested for HIV. You should then be tested every 3 months for as long as you are taking the medicine. Pregnancy  If you are about to stop having your period (premenopausal) and you may become pregnant, seek counseling before you get pregnant.  Take 400 to 800 micrograms (mcg) of folic acid every day if you become pregnant.  Ask for birth control (contraception) if you want to prevent pregnancy. Osteoporosis and menopause Osteoporosis is a disease in which the bones lose minerals and strength  with aging. This can result in bone fractures. If you are 68 years old or older, or if you are at risk for osteoporosis and fractures, ask your health care provider if you should:  Be screened for bone loss.  Take a calcium or vitamin D supplement to lower your risk of fractures.  Be given hormone replacement therapy (HRT) to treat symptoms of menopause. Follow these instructions at home: Lifestyle  Do not use any products that contain nicotine or tobacco, such as cigarettes, e-cigarettes, and chewing tobacco. If you need help quitting, ask your health care provider.  Do not use street drugs.  Do not share needles.  Ask your health care provider for help if you need support or information about quitting drugs. Alcohol use  Do not drink alcohol if: ? Your health care provider tells you not to drink. ? You are pregnant, may be pregnant, or are planning to become pregnant.  If you drink alcohol: ? Limit how much you use to 0-1 drink a day. ? Limit intake if you are breastfeeding.  Be aware of how much alcohol is in your drink. In the [place] one drink equals one 12 oz bottle of beer (355 mL), one 5 oz glass of wine (148 mL), or one 1 oz glass of hard liquor (44 mL). General instructions  Schedule regular health, dental, and eye exams.  Stay current with your vaccines.  Tell your health care provider if: ? You often feel depressed. ? You have ever been abused or do not feel safe at home. Summary  Adopting a healthy lifestyle and getting preventive care are important in promoting health and wellness.  Follow your health care provider's instructions about healthy diet, exercising, and getting tested or screened for diseases.  Follow your health care provider's instructions on monitoring your cholesterol and blood pressure. This information is not intended to replace advice given to you by your health care provider. Make sure you discuss any questions you have with your health  care provider. Document Revised: 11/03/2018 Document Reviewed: 11/03/2018 Elsevier Patient Education  2021  [employer].

## 2021-04-17 MED ORDER — TRAMADOL HCL 50 MG PO TABS: 50.0000 mg | ORAL_TABLET | Freq: Three times a day (TID) | ORAL | 0 refills | Status: DC | PRN

## 2021-04-24 ENCOUNTER — Telehealth: Payer: Self-pay | Admitting: *Deleted

## 2021-04-24 NOTE — Telephone Encounter (Signed)
    [facility] Pre-operative Risk Assessment    Request for surgical clearance:  1. What type of surgery is being performed? Left Knee Total Revision   2. When is this surgery scheduled? 05/10/2021   3. What type of clearance is required (medical clearance vs. Pharmacy clearance to hold med vs. Both)? Both  4. Are there any medications that need to be held prior to surgery and how long? Eliquis   5. Practice name and name of physician performing surgery? [facility], Lowella Petties, MD   6. What is your office phone number [phone]    7.   What is your office fax number [phone]  8.   Anesthesia type (None, local, MAC, general) ? Spinal   Melissa Rodgers 04/24/2021, 2:44 PM  _________________________________________________________________   (provider comments below)

## 2021-04-24 NOTE — Telephone Encounter (Signed)
Patient with diagnosis of afib on Eliquis for anticoagulation.    Procedure: Left Knee Total Revision  Date of procedure: 05/10/21  CHA2DS2-VASc Score = 3  This indicates a 3.2% annual risk of stroke. The patient's score is based upon: CHF History: No HTN History: Yes Diabetes History: No Stroke History: No Vascular Disease History: No Age Score: 1 Gender Score: 1     CrCl 85 ml/min Platelet count 278  Per office protocol, patient can hold Eliquis for 3 days prior to procedure.

## 2021-04-24 NOTE — Telephone Encounter (Signed)
   Name: Melissa Rodgers  DOB: 1951-09-20  MRN: [id number]  Primary Cardiologist: Shirlee More, MD  Chart reviewed as part of pre-operative protocol coverage. Because of Melissa Rodgers's past medical history and time since last visit, she will require a follow-up visit in order to better assess preoperative cardiovascular risk.  This is scheduled for 05/02/21 with Dr.Munley. Recommendations will be provided at that time.   Per pharmacy review and office protocols she may hold Eliquis 3 days prior to planned procedure.   Will route this note to requesting surgeon's office via [place] fax function to inform them of need for appointment prior to surgery. Will remove from preop pool.  Loel Dubonnet, NP  04/24/2021, 4:34 PM

## 2021-04-25 ENCOUNTER — Other Ambulatory Visit (HOSPITAL_COMMUNITY): Payer: Self-pay

## 2021-04-26 NOTE — Patient Instructions (Addendum)
DUE TO COVID-19 ONLY ONE VISITOR IS ALLOWED TO COME WITH YOU AND STAY IN THE WAITING ROOM ONLY DURING PRE OP AND PROCEDURE DAY OF SURGERY. THE 1 VISITOR  MAY VISIT WITH YOU AFTER SURGERY IN YOUR PRIVATE ROOM DURING VISITING HOURS ONLY!  YOU NEED TO HAVE A COVID 19 TEST ON: 05/07/21 @ 10:00 AM , THIS TEST MUST BE DONE BEFORE SURGERY,  COVID TESTING SITE [place] [id number], IT IS ON THE RIGHT GOING OUT [address] APPROXIMATELY  2 MINUTES PAST [employer] ON THE RIGHT. ONCE YOUR COVID TEST IS COMPLETED,  PLEASE BEGIN THE QUARANTINE INSTRUCTIONS AS OUTLINED IN YOUR HANDOUT.                Melissa Rodgers   Your procedure is scheduled on: 05/10/21   Report to [facility] Main  Entrance   Report to admitting at: 12:30 PM     Call this number if you have problems the morning of surgery [phone]    Remember: NO SOLID FOOD AFTER MIDNIGHT THE NIGHT PRIOR TO SURGERY. NOTHING BY MOUTH EXCEPT CLEAR LIQUIDS UNTIL: 12:00 PM . PLEASE FINISH ENSURE DRINK PER SURGEON ORDER  WHICH NEEDS TO BE COMPLETED AT: 12:00 PM .  CLEAR LIQUID DIET  Foods Allowed                                                                     Foods Excluded  Coffee and tea, regular and decaf                             liquids that you cannot  Plain Jell-O any favor except red or purple                                           see through such as: Fruit ices (not with fruit pulp)                                     milk, soups, orange juice  Iced Popsicles                                    All solid food Carbonated beverages, regular and diet                                    Cranberry, grape and apple juices Sports drinks like Gatorade Lightly seasoned clear broth or consume(fat free) Sugar, honey syrup  Sample Menu Breakfast                                Lunch  Supper Cranberry juice                    Beef broth                             Chicken broth Jell-O                                     Grape juice                           Apple juice Coffee or tea                        Jell-O                                      Popsicle                                                Coffee or tea                        Coffee or tea  _____________________________________________________________________  BRUSH YOUR TEETH MORNING OF SURGERY AND RINSE YOUR MOUTH OUT, NO CHEWING GUM CANDY OR MINTS.    Take these medicines the morning of surgery with A SIP OF WATER: Tambocor(flecainide),gabapentin,fluoxetine,cetirizine>Diltiazem and clonazepam as needed.                               You may not have any metal on your body including hair pins and              piercings  Do not wear jewelry, make-up, lotions, powders or perfumes, deodorant             Do not wear nail polish on your fingernails.  Do not shave  48 hours prior to surgery.    Do not bring valuables to the hospital. [place].  Contacts, dentures or bridgework may not be worn into surgery.  Leave suitcase in the car. After surgery it may be brought to your room.     Patients discharged the day of surgery will not be allowed to drive home. IF YOU ARE HAVING SURGERY AND GOING HOME THE SAME DAY, YOU MUST HAVE AN ADULT TO DRIVE YOU HOME AND BE WITH YOU FOR 24 HOURS. YOU MAY GO HOME BY TAXI OR UBER OR ORTHERWISE, BUT AN ADULT MUST ACCOMPANY YOU HOME AND STAY WITH YOU FOR 24 HOURS.  Name and phone number of your driver:  Special Instructions: N/A              Please read over the following fact sheets you were given: _____________________________________________________________________        [facility] - Preparing for Surgery Before surgery, you can play an important role.  Because skin is not sterile, your skin needs to be as free of germs as possible.  You can reduce the number of germs on your skin by washing with CHG  (chlorahexidine gluconate) soap before surgery.  CHG is an antiseptic cleaner which kills germs and bonds with the skin to continue killing germs even after washing. Please DO NOT use if you have an allergy to CHG or antibacterial soaps.  If your skin becomes reddened/irritated stop using the CHG and inform your nurse when you arrive at Short Stay. Do not shave (including legs and underarms) for at least 48 hours prior to the first CHG shower.  You may shave your face/neck. Please follow these instructions carefully:  1.  Shower with CHG Soap the night before surgery and the  morning of Surgery.  2.  If you choose to wash your hair, wash your hair first as usual with your  normal  shampoo.  3.  After you shampoo, rinse your hair and body thoroughly to remove the  shampoo.                           4.  Use CHG as you would any other liquid soap.  You can apply chg directly  to the skin and wash                       Gently with a scrungie or clean washcloth.  5.  Apply the CHG Soap to your body ONLY FROM THE NECK DOWN.   Do not use on face/ open                           Wound or open sores. Avoid contact with eyes, ears mouth and genitals (private parts).                       Wash face,  Genitals (private parts) with your normal soap.             6.  Wash thoroughly, paying special attention to the area where your surgery  will be performed.  7.  Thoroughly rinse your body with warm water from the neck down.  8.  DO NOT shower/wash with your normal soap after using and rinsing off  the CHG Soap.                9.  Pat yourself dry with a clean towel.            10.  Wear clean pajamas.            11.  Place clean sheets on your bed the night of your first shower and do not  sleep with pets. Day of Surgery : Do not apply any lotions/deodorants the morning of surgery.  Please wear clean clothes to the hospital/surgery center.  FAILURE TO FOLLOW THESE INSTRUCTIONS MAY RESULT IN THE CANCELLATION OF  YOUR SURGERY PATIENT SIGNATURE_________________________________  NURSE SIGNATURE__________________________________  ________________________________________________________________________   Melissa Rodgers  An incentive spirometer is a tool that can help keep your lungs clear and active. This tool measures how well you are filling your lungs with each breath. Taking long deep breaths may help reverse or decrease the chance of developing breathing (pulmonary) problems (especially infection) following:  A long period of time when you are unable to move or be active. BEFORE THE PROCEDURE   If the spirometer includes an indicator to show your best effort, your nurse or respiratory therapist will  set it to a desired goal.  If possible, sit up straight or lean slightly forward. Try not to slouch.  Hold the incentive spirometer in an upright position. INSTRUCTIONS FOR USE  1. Sit on the edge of your bed if possible, or sit up as far as you can in bed or on a chair. 2. Hold the incentive spirometer in an upright position. 3. Breathe out normally. 4. Place the mouthpiece in your mouth and seal your lips tightly around it. 5. Breathe in slowly and as deeply as possible, raising the piston or the ball toward the top of the column. 6. Hold your breath for 3-5 seconds or for as long as possible. Allow the piston or ball to fall to the bottom of the column. 7. Remove the mouthpiece from your mouth and breathe out normally. 8. Rest for a few seconds and repeat Steps 1 through 7 at least 10 times every 1-2 hours when you are awake. Take your time and take a few normal breaths between deep breaths. 9. The spirometer may include an indicator to show your best effort. Use the indicator as a goal to work toward during each repetition. 10. After each set of 10 deep breaths, practice coughing to be sure your lungs are clear. If you have an incision (the cut made at the time of surgery), support your  incision when coughing by placing a pillow or rolled up towels firmly against it. Once you are able to get out of bed, walk around indoors and cough well. You may stop using the incentive spirometer when instructed by your caregiver.  RISKS AND COMPLICATIONS  Take your time so you do not get dizzy or light-headed.  If you are in pain, you may need to take or ask for pain medication before doing incentive spirometry. It is harder to take a deep breath if you are having pain. AFTER USE  Rest and breathe slowly and easily.  It can be helpful to keep track of a log of your progress. Your caregiver can provide you with a simple table to help with this. If you are using the spirometer at home, follow these instructions: [place] IF:   You are having difficultly using the spirometer.  You have trouble using the spirometer as often as instructed.  Your pain medication is not giving enough relief while using the spirometer.  You develop fever of 100.5 F (38.1 C) or higher. SEEK IMMEDIATE MEDICAL CARE IF:   You cough up bloody sputum that had not been present before.  You develop fever of 102 F (38.9 C) or greater.  You develop worsening pain at or near the incision site. MAKE SURE YOU:   Understand these instructions.  Will watch your condition.  Will get help right away if you are not doing well or get worse. Document Released: 03/23/2007 Document Revised: 02/02/2012 Document Reviewed: 05/24/2007 [facility] Patient Information 2014 [place], [place].   ________________________________________________________________________

## 2021-04-29 ENCOUNTER — Encounter (HOSPITAL_COMMUNITY)
Admission: RE | Admit: 2021-04-29 | Discharge: 2021-04-29 | Disposition: A | Discharge status: Home / Self Care | Payer: Medicare Other | Source: Ambulatory Visit | Attending: Orthopedic Surgery | Admitting: Orthopedic Surgery

## 2021-04-29 ENCOUNTER — Encounter (HOSPITAL_COMMUNITY): Payer: Self-pay

## 2021-04-29 ENCOUNTER — Other Ambulatory Visit: Payer: Self-pay

## 2021-04-29 ENCOUNTER — Ambulatory Visit (HOSPITAL_COMMUNITY)
Admission: RE | Admit: 2021-04-29 | Discharge: 2021-04-29 | Disposition: A | Discharge status: Home / Self Care | Payer: Medicare Other | Source: Ambulatory Visit | Attending: Orthopedic Surgery | Admitting: Orthopedic Surgery

## 2021-04-29 DIAGNOSIS — I4891 Unspecified atrial fibrillation: Secondary | ICD-10-CM | POA: Insufficient documentation

## 2021-04-29 DIAGNOSIS — Z7901 Long term (current) use of anticoagulants: Secondary | ICD-10-CM | POA: Insufficient documentation

## 2021-04-29 DIAGNOSIS — Z01811 Encounter for preprocedural respiratory examination: Secondary | ICD-10-CM

## 2021-04-29 DIAGNOSIS — Z01818 Encounter for other preprocedural examination: Secondary | ICD-10-CM | POA: Diagnosis not present

## 2021-04-29 HISTORY — DX: Anemia, unspecified: D64.9

## 2021-04-29 HISTORY — DX: Cardiac arrhythmia, unspecified: I49.9

## 2021-04-29 LAB — COMPREHENSIVE METABOLIC PANEL
ALT: 26 U/L (ref 0–44)
AST: 25 U/L (ref 15–41)
Albumin: 4.3 g/dL (ref 3.5–5.0)
Alkaline Phosphatase: 63 U/L (ref 38–126)
Anion gap: 7 (ref 5–15)
BUN: 14 mg/dL (ref 8–23)
CO2: 28 mmol/L (ref 22–32)
Calcium: 9.1 mg/dL (ref 8.9–10.3)
Chloride: 102 mmol/L (ref 98–111)
Creatinine, Ser: 0.6 mg/dL (ref 0.44–1.00)
GFR, Estimated: >60 mL/min (ref >60)
Glucose, Bld: 95 mg/dL (ref 70–99)
Potassium: 3.9 mmol/L (ref 3.5–5.1)
Sodium: 137 mmol/L (ref 135–145)
Total Bilirubin: 0.7 mg/dL (ref 0.3–1.2)
Total Protein: 7.9 g/dL (ref 6.5–8.1)

## 2021-04-29 LAB — PROTIME-INR
INR: 1.2 (ref 0.8–1.2)
Prothrombin Time: 15.1 seconds (ref 11.4–15.2)

## 2021-04-29 LAB — CBC WITH DIFFERENTIAL/PLATELET
Abs Immature Granulocytes: 0.04 10*3/uL (ref 0.00–0.07)
Basophils Absolute: 0.1 10*3/uL (ref 0.0–0.1)
Basophils Relative: 1 %
Eosinophils Absolute: 0.3 10*3/uL (ref 0.0–0.5)
Eosinophils Relative: 3 %
HCT: 46.2 % — ABNORMAL HIGH (ref 36.0–46.0)
Hemoglobin: 14.7 g/dL (ref 12.0–15.0)
Immature Granulocytes: 1 %
Lymphocytes Relative: 34 %
Lymphs Abs: 2.6 10*3/uL (ref 0.7–4.0)
MCH: 29.6 pg (ref 26.0–34.0)
MCHC: 31.8 g/dL (ref 30.0–36.0)
MCV: 93 fL (ref 80.0–100.0)
Monocytes Absolute: 0.6 10*3/uL (ref 0.1–1.0)
Monocytes Relative: 8 %
Neutro Abs: 4.2 10*3/uL (ref 1.7–7.7)
Neutrophils Relative %: 53 %
Platelets: 302 10*3/uL (ref 150–400)
RBC: 4.97 MIL/uL (ref 3.87–5.11)
RDW: 13.2 % (ref 11.5–15.5)
WBC: 7.8 10*3/uL (ref 4.0–10.5)
nRBC: 0 % (ref 0.0–0.2)

## 2021-04-29 LAB — SURGICAL PCR SCREEN
MRSA, PCR: NEGATIVE
Staphylococcus aureus: POSITIVE — AB

## 2021-04-29 LAB — URINALYSIS, ROUTINE W REFLEX MICROSCOPIC
Bacteria, UA: NONE SEEN
Bilirubin Urine: NEGATIVE
Glucose, UA: NEGATIVE mg/dL
Ketones, ur: NEGATIVE mg/dL
Leukocytes,Ua: NEGATIVE
Nitrite: NEGATIVE
Protein, ur: NEGATIVE mg/dL
Specific Gravity, Urine: 1.005 (ref 1.005–1.030)
pH: 7 (ref 5.0–8.0)

## 2021-04-29 LAB — TYPE AND SCREEN
ABO/RH(D): O POS
Antibody Screen: NEGATIVE

## 2021-04-29 LAB — APTT: aPTT: 46 seconds — ABNORMAL HIGH (ref 24–36)

## 2021-04-29 NOTE — Progress Notes (Signed)
Lab. Results: PTT: 46

## 2021-04-29 NOTE — Progress Notes (Signed)
COVID Vaccine Completed: Yes Date COVID Vaccine completed: 03/18/21 [place] COVID vaccine manufacturer: Pfizer     PCP - Dr. Ricki Miller Cardiologist - Dr. Aundria Mems. Clearance: Laurann [place]: 04/24/21: EPIC  Chest x-ray -  EKG - 12/24/20. Epic. Stress Test -  ECHO -  Cardiac Cath -  Pacemaker/ICD device last checked:  Sleep Study -  CPAP -   Fasting Blood Sugar -  Checks Blood Sugar _____ times a day  Blood Thinner Instructions: Eliquis: will be on hold 3 days prior surgery as per cardiologist. Aspirin Instructions: Last Dose:  Anesthesia review: hx: HTN,Afib.  Patient denies shortness of breath, fever, cough and chest pain at PAT appointment   Patient verbalized understanding of instructions that were given to them at the PAT appointment. Patient was also instructed that they will need to review over the PAT instructions again at home before surgery.

## 2021-05-01 ENCOUNTER — Other Ambulatory Visit: Payer: Medicare Other

## 2021-05-01 ENCOUNTER — Ambulatory Visit: Payer: Medicare Other

## 2021-05-01 NOTE — Progress Notes (Addendum)
Cardiology Office Note:    Date:  05/02/2021   ID:  Melissa Rodgers, DOB 1951/01/15, MRN [id number]  PCP:  Melissa Reeve, DO  Cardiologist:  Melissa More, MD    Referring MD: Melissa Reeve, DO    ASSESSMENT:    1. Preop cardiovascular exam   2. Essential hypertension   3. Shortness of breath   4. PAF (paroxysmal atrial fibrillation) ([place])   5. High risk medication use    PLAN:    In order of problems listed above:  Stable from a cardiology perspective she will withdraw her anticoagulant 3 days prior to surgery.  If kept overnight put in a monitored bed EKG postoperative day 1 continue her usual cardiac medications and generally resume anticoagulation 48 hours after surgery.  In my opinion she is optimized for planned surgery Well-controlled BP at target continue ARB beta-blocker Subtle symptoms certainly be at risk for heart failure with atrial fibrillation we will check a proBNP level and she will have an echocardiogram next done in the next 1 to 2 days.  Unless she had severe left ventricular dysfunction I would not interfere with her surgery or alter antiarrhythmic drug flecainide. Continue flecainide no evidence of toxicity by EKG criteria  05/03/2024 her echocardiogram shows normal cardiac function no findings of heart failure.  She should proceed with her planned orthopedic surgery  1. Left ventricular ejection fraction, by estimation, is 60 to 65%. The left ventricle has normal function. The left ventricle has no regional wall motion abnormalities. Left ventricular diastolic parameters are consistent with Grade I diastolic dysfunction (impaired relaxation).  2. Right ventricular systolic function is normal. The right ventricular size is normal. There is normal pulmonary artery systolic pressure. The estimated right ventricular systolic pressure is 13.2 mmHg.  3. The mitral valve is normal in structure. Mild mitral valve regurgitation. No evidence of mitral stenosis.   4. The aortic valve is normal in structure. Aortic valve regurgitation is mild. Mild aortic valve sclerosis is present, with no evidence of aortic valve stenosis. Aortic regurgitation PHT measures 748 msec.  5. The inferior vena cava is normal in size with greater than 50% respiratory variability, suggesting right atrial pressure of 3 mmHg.  Next appointment: 6 months   Medication Adjustments/Labs and Tests Ordered: Current medicines are reviewed at length with the patient today.  Concerns regarding medicines are outlined above.  Orders Placed This Encounter  Procedures   Pro b natriuretic peptide (BNP)   EKG 12-Lead   ECHOCARDIOGRAM COMPLETE   No orders of the defined types were placed in this encounter.   Chief Complaint  Patient presents with   Edema   Irregular Heart Beat   Pre-op Exam    History of Present Illness:    Melissa Rodgers is a 70 y.o. female with a hx of paroxysmal atrial fibrillation maintaining sinus rhythm with flecainide anticoagulated and hypertension last seen 12/24/2020.  She is pending right total knee revision Dr. Dorna Rodgers 05/10/2021. Compliance with diet, lifestyle and medications: Yes  This has become a bit of a complicated visit She is scheduled for revision total knee arthroplasty next Friday She is instructed to hold her anticoagulant 3 full days prior to surgery I agree She has been taking gabapentin for chronic pain and has had a little bit of edema. She has had no recurrent atrial fibrillation but she had an episode of weakness and shortness of breath Presently no shortness of breath not having edema no chest pain palpitation or syncope. After steroid  injection left shoulder she had a rapid heart rate and took a single dose of diltiazem with good heart rate control I reviewed her iPhone based telemetry all the strips are sinus rhythm. Tolerates her anticoagulant without bleeding  Cardiac CT 01/21/2018 with a calcium score of 0 and normal  coronary arteries. Echocardiogram 2017 showed normal left and right ventricular function normal atrial size and no significant valvular regurgitation Past Medical History:  Diagnosis Date   Anemia    Atrial fibrillation 06/18/2019   BMI 30.0-30.9,adult 01/27/2013   Candidosis of skin 04/17/2016   Formatting of this note might be different from the original. 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 01/29/15:  24 Hour Holter Monitor CONCLUSIONS: 1. Overall normal 24-hour Holter monitor recording, with rare, brief,  asymptomatic nonsustained supraventricular tachycardia as described.  2. No cardiac rhythm explanation for patient's symptoms.       Primary osteoarthritis of both knees 10/07/2016   Formatting of this note might be different from the original. 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   Primary osteoarthritis of right knee 11/21/2014   S/P laparoscopic fundoplication 0/96/2836   Scar condition and fibrosis of skin  04/17/2016   Formatting of this note might be different from the original. lower abdomen   Sebaceous hyperplasia 04/17/2016   Seborrheic keratoses 04/17/2016   Skin tags, multiple acquired 04/17/2016   Solar lentigo 04/17/2016   Subarachnoid hemorrhage following injury 2010   Fall from horse; brief hospitalization    Past Surgical History:  Procedure Laterality Date   ESOPHAGOGASTRODUODENOSCOPY     possibly last one was 2017. In [place]   HERNIA REPAIR     MEDIAL PARTIAL KNEE REPLACEMENT Right 62/94/7654   NISSEN FUNDOPLICATION  65/01/5464   OOPHORECTOMY     PARTIAL KNEE ARTHROPLASTY Left 04/05/2014    Current Medications: Current Meds  Medication Sig   apixaban (ELIQUIS) 5 MG TABS tablet Take 5 mg by mouth 2 (two) times daily.   cetirizine (ZYRTEC) 10 MG tablet Take 10 mg by mouth daily.   Cholecalciferol (VITAMIN D-3) 1000 units CAPS Take 1,000 Units by mouth daily.   clonazePAM (KLONOPIN) 0.5 MG tablet Take 0.5 mg by mouth daily as needed for anxiety.   Cyanocobalamin (B-12 PO) Take 1 tablet by mouth daily.   diclofenac Sodium (VOLTAREN) 1 % GEL Apply 2 g topically daily as needed (knee pain).   diltiazem (CARDIZEM) 30 MG tablet Take 1 tablet (30 mg total) by mouth every 4 (four) hours as needed.   flecainide (TAMBOCOR) 50 MG  tablet TAKE 1 TABLET(50 MG) BY MOUTH TWICE DAILY   FLUoxetine (PROZAC) 10 MG tablet Take 10 mg by mouth daily.   gabapentin (NEURONTIN) 300 MG capsule Take 300 mg by mouth 2 (two) times daily.   losartan (COZAAR) 50 MG tablet Take 25 mg by mouth in the morning.   metoprolol succinate (TOPROL-XL) 25 MG 24 hr tablet TAKE 1 TABLET(25 MG) BY MOUTH DAILY   traMADol (ULTRAM) 50 MG tablet Take 1-2 tablets (50-100 mg total) by mouth every 8 (eight) hours as needed for moderate pain. Maximum 6 tabs per day.     Allergies:   Rosuvastatin and Sulfa antibiotics   Social History   Socioeconomic History   Marital status: Married    Spouse name: Kirtland Bouchard   Number of  children: 0   Years of education: 18   Highest education level: Master's degree (e.g., MA, MS, MEng, MEd, MSW, MBA)  Occupational History   Occupation: Retired  Tobacco Use   Smoking status: Former    Pack years: 0.00    Types: Cigarettes    Quit date: 08/18/1983    Years since quitting: 37.7   Smokeless tobacco: Never  Vaping Use   Vaping Use: Never used  Substance and Sexual Activity   Alcohol use: Yes    Alcohol/week: 7.0 - 14.0 standard drinks    Types: 7 - 14 Glasses of wine per week    Comment: 1-2 drinks per night, usually wine   Drug use: Never   Sexual activity: Yes    Partners: Male    Birth control/protection: None  Other Topics Concern   Not on file  Social History Narrative   Lives with her husband. Stays active and has a lot of hobbies such as gardening, cooking, reading and doing crafts.   Social Determinants of Health   Financial Resource Strain: Low Risk    Difficulty of Paying Living Expenses: Not hard at all  Food Insecurity: No Food Insecurity   Worried About Charity fundraiser in the Last Year: Never true   [place] in the Last Year: Never true  Transportation Needs: No Transportation Needs   Lack of Transportation (Medical): No   Lack of Transportation (Non-Medical): No  Physical Activity: Inactive   Days of Exercise per Week: 0 days   Minutes of Exercise per Session: 0 min  Stress: No Stress Concern Present   Feeling of Stress : Not at all  Social Connections: Moderately Isolated   Frequency of Communication with Friends and Family: Rodgers than three times a week   Frequency of Social Gatherings with Friends and Family: Once a week   Attends Religious Services: Never   Marine scientist or Organizations: No   Attends Music therapist: Never   Marital Status: Married     Family History: The patient's family history includes Alzheimer's disease in her father; Atrial fibrillation in her sister; Diabetes in her mother;  Heart disease in her mother; Tremor in an other family member. There is no history of Colon cancer or Esophageal cancer. ROS:   Please see the history of present illness.    All other systems reviewed and are negative.  EKGs/Labs/Other Studies Reviewed:    The following studies were reviewed today:  EKG:  EKG ordered today and personally reviewed.  The ekg ordered today demonstrates sinus rhythm normal EKG  Recent Labs: 09/13/2020: TSH 2.78 04/29/2021: ALT 26; BUN 14; Creatinine, Ser 0.60; Hemoglobin 14.7; Platelets 302; Potassium  3.9; Sodium 137  Recent Lipid Panel    Component Value Date/Time   CHOL 220 (H) 02/22/2020 0920   TRIG 229 (H) 02/22/2020 0920   HDL 48 (L) 02/22/2020 0920   CHOLHDL 4.6 02/22/2020 0920   LDLCALC 135 (H) 02/22/2020 0920    Physical Exam:    VS:  BP 120/70 (BP Location: Right Arm, Patient Position: Sitting, Cuff Size: Normal)   Pulse 64   Ht 5\' 3"  (1.6 m)   Wt 184 lb (83.5 kg)   SpO2 97%   BMI 32.59 kg/m     Wt Readings from Last 3 Encounters:  05/02/21 184 lb (83.5 kg)  04/29/21 183 lb (83 kg)  12/24/20 184 lb 1.9 oz (83.5 kg)     GEN:  Well nourished, well developed in no acute distress HEENT: Normal NECK: No JVD; No carotid bruits LYMPHATICS: No lymphadenopathy CARDIAC: RRR, no murmurs, rubs, gallops RESPIRATORY:  Clear to auscultation without rales, wheezing or rhonchi  ABDOMEN: Soft, non-tender, non-distended MUSCULOSKELETAL:  No edema; No deformity  SKIN: Warm and dry NEUROLOGIC:  Alert and oriented x 3 PSYCHIATRIC:  Normal affect    Signed, Melissa More, MD  05/02/2021 11:26 AM    [place]

## 2021-05-02 ENCOUNTER — Encounter: Payer: Self-pay | Admitting: Cardiology

## 2021-05-02 ENCOUNTER — Other Ambulatory Visit: Payer: Self-pay

## 2021-05-02 ENCOUNTER — Ambulatory Visit: Payer: Medicare Other | Admitting: Cardiology

## 2021-05-02 ENCOUNTER — Ambulatory Visit (INDEPENDENT_AMBULATORY_CARE_PROVIDER_SITE_OTHER): Payer: Medicare Other | Admitting: Cardiology

## 2021-05-02 VITALS — BP 120/70 | HR 64 | Ht 63.0 in | Wt 184.0 lb

## 2021-05-02 DIAGNOSIS — Z79899 Other long term (current) drug therapy: Secondary | ICD-10-CM

## 2021-05-02 DIAGNOSIS — I10 Essential (primary) hypertension: Secondary | ICD-10-CM | POA: Diagnosis not present

## 2021-05-02 DIAGNOSIS — Z01810 Encounter for preprocedural cardiovascular examination: Secondary | ICD-10-CM | POA: Diagnosis not present

## 2021-05-02 DIAGNOSIS — R0602 Shortness of breath: Secondary | ICD-10-CM

## 2021-05-02 DIAGNOSIS — I480 Paroxysmal atrial fibrillation: Secondary | ICD-10-CM | POA: Diagnosis not present

## 2021-05-02 NOTE — Patient Instructions (Signed)
Medication Instructions:  Your physician has recommended you make the following change in your medication: DECREASE: Gabapentin to twice daily. *If you need a refill on your cardiac medications before your next appointment, please call your pharmacy*   Lab Work: Your physician recommends that you return for lab work in: [place] If you have labs (blood work) drawn today and your tests are completely normal, you will receive your results only by: [place] (if you have MyChart) OR A paper copy in the mail If you have any lab test that is abnormal or we need to change your treatment, we will call you to review the results.   Testing/Procedures: Your physician has requested that you have an echocardiogram. Echocardiography is a painless test that uses sound waves to create images of your heart. It provides your doctor with information about the size and shape of your heart and how well your heart's chambers and valves are working. This procedure takes approximately one hour. There are no restrictions for this procedure.    Follow-Up: At [facility], you and your health needs are our priority.  As part of our continuing mission to provide you with exceptional heart care, we have created designated Provider Care Teams.  These Care Teams include your primary Cardiologist (physician) and Advanced Practice Providers (APPs -  Physician Assistants and Nurse Practitioners) who all work together to provide you with the care you need, when you need it.  We recommend signing up for the patient portal called "MyChart".  Sign up information is provided on this After Visit Summary.  MyChart is used to connect with patients for Virtual Visits (Telemedicine).  Patients are able to view lab/test results, encounter notes, upcoming appointments, etc.  Non-urgent messages can be sent to your provider as well.   To learn more about what you can do with MyChart, go to [email or web address].    Your  next appointment:   3 month(s)  The format for your next appointment:   In Person  Provider:   Shirlee More, MD   Other Instructions

## 2021-05-03 ENCOUNTER — Telehealth: Payer: Self-pay

## 2021-05-03 ENCOUNTER — Ambulatory Visit (HOSPITAL_COMMUNITY)
Admission: RE | Admit: 2021-05-03 | Discharge: 2021-05-03 | Disposition: A | Discharge status: Home / Self Care | Payer: Medicare Other | Source: Ambulatory Visit | Attending: Cardiology | Admitting: Cardiology

## 2021-05-03 DIAGNOSIS — I10 Essential (primary) hypertension: Secondary | ICD-10-CM | POA: Diagnosis not present

## 2021-05-03 DIAGNOSIS — I352 Nonrheumatic aortic (valve) stenosis with insufficiency: Secondary | ICD-10-CM | POA: Insufficient documentation

## 2021-05-03 DIAGNOSIS — R0602 Shortness of breath: Secondary | ICD-10-CM | POA: Insufficient documentation

## 2021-05-03 DIAGNOSIS — I4891 Unspecified atrial fibrillation: Secondary | ICD-10-CM | POA: Diagnosis not present

## 2021-05-03 DIAGNOSIS — Z01810 Encounter for preprocedural cardiovascular examination: Secondary | ICD-10-CM

## 2021-05-03 LAB — ECHOCARDIOGRAM COMPLETE
Area-P 1/2: 2.76 cm2
P 1/2 time: 748 msec
S' Lateral: 2.5 cm
Single Plane A4C EF: 53 %

## 2021-05-03 LAB — PRO B NATRIURETIC PEPTIDE: NT-Pro BNP: 48 pg/mL (ref 0–301)

## 2021-05-03 NOTE — Telephone Encounter (Signed)
Spoke with patient regarding results and recommendation.  Patient verbalizes understanding and is agreeable to plan of care. Advised patient to call back with any issues or concerns.  

## 2021-05-03 NOTE — Telephone Encounter (Signed)
Melissa Rodgers is returning [employer]. States you can reach her via My Chart or callback. Please advise.

## 2021-05-03 NOTE — Progress Notes (Signed)
  Echocardiogram 2D Echocardiogram has been performed.  Melissa Rodgers 05/03/2021, 11:49 AM

## 2021-05-03 NOTE — Progress Notes (Signed)
Anesthesia Chart Review:   Case: [id number] Date/Time: 05/10/21 1448   Procedure: TOTAL KNEE REVISION (Left: Knee)   Anesthesia type: Spinal   Pre-op diagnosis: LEFT KNEE PAIN AFTER UNICOMPARTMENTAL KNEE REPLACEMENT   Location: [facility] ROOM 08 / [facility]   Surgeons: Dorna Leitz, MD       DISCUSSION: Pt is 70 years old with hx atrial fibrillation, HTN, anemia  - Pt to stop eliquis 3 days before surgery   VS: BP 134/83   Pulse (!) 58   Temp 36.9 C (Oral)   Ht 5\' 3"  (1.6 m)   Wt 83 kg   SpO2 97%   BMI 32.42 kg/m   PROVIDERS: - PCP is [name], DO - Cardiologist is Shirlee More, MD. Last office visit 05/02/21. Cleared pt for surgery in comment on echo 05/03/21  LABS:  - PTT 46. PT 15.1.  Will recheck PTT day of surgery   (all labs ordered are listed, but only abnormal results are displayed)  Labs Reviewed  SURGICAL PCR SCREEN - Abnormal; Notable for the following components:      Result Value   Staphylococcus aureus POSITIVE (*)    All other components within normal limits  CBC WITH DIFFERENTIAL/PLATELET - Abnormal; Notable for the following components:   HCT 46.2 (*)    All other components within normal limits  APTT - Abnormal; Notable for the following components:   aPTT 46 (*)    All other components within normal limits  URINALYSIS, ROUTINE W REFLEX MICROSCOPIC - Abnormal; Notable for the following components:   Color, Urine STRAW (*)    Hgb urine dipstick SMALL (*)    All other components within normal limits  COMPREHENSIVE METABOLIC PANEL  PROTIME-INR  TYPE AND SCREEN     IMAGES: CXR 04/29/21:  FINDINGS: - Upper normal heart size. - Mediastinal contours and pulmonary vascularity normal. - Lungs clear. - No pulmonary infiltrate, pleural effusion, or pneumothorax. - Biconvex thoracolumbar scoliosis. IMPRESSION:  No acute abnormalities.   EKG 12/24/20: NSR. Poor R wave progression   CV: Echo 05/03/21:  1. Left ventricular ejection fraction, by  estimation, is 60 to 65%. The left ventricle has normal function. The left ventricle has no regional wall motion abnormalities. Left ventricular diastolic parameters are consistent with Grade I diastolic dysfunction (impaired relaxation).   2. Right ventricular systolic function is normal. The right ventricular size is normal. There is normal pulmonary artery systolic pressure. The estimated right ventricular systolic pressure is 89.3 mmHg.   3. The mitral valve is normal in structure. Mild mitral valve regurgitation. No evidence of mitral stenosis.   4. The aortic valve is normal in structure. Aortic valve regurgitation is mild. Mild aortic valve sclerosis is present, with no evidence of aortic valve stenosis. Aortic regurgitation PHT measures 748 msec.   5. The inferior vena cava is normal in size with greater than 50% respiratory variability, suggesting right atrial pressure of 3 mmHg.    Past Medical History:  Diagnosis Date   Anemia    Atrial fibrillation 06/18/2019   BMI 30.0-30.9,adult 01/27/2013   Candidosis of skin 04/17/2016   Formatting of this note might be different from the original. 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 01/29/15:  24 Hour Holter Monitor CONCLUSIONS: 1. Overall normal 24-hour Holter monitor recording, with rare, brief,  asymptomatic nonsustained supraventricular tachycardia as described.  2. No cardiac rhythm explanation for patient's symptoms.       Primary osteoarthritis of both knees 10/07/2016   Formatting of this note might be different from the original. 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   Primary osteoarthritis of right knee 11/21/2014   S/P laparoscopic fundoplication 9/72/8206   Scar condition and fibrosis of skin 04/17/2016   Formatting of this note might be different from the original. lower abdomen   Sebaceous hyperplasia 04/17/2016   Seborrheic keratoses 04/17/2016   Skin tags, multiple acquired 04/17/2016   Solar lentigo 04/17/2016   Subarachnoid hemorrhage following injury 2010   Fall from horse; brief hospitalization    Past Surgical History:  Procedure Laterality Date   ESOPHAGOGASTRODUODENOSCOPY     possibly last one was 2017. In [place]   HERNIA REPAIR     MEDIAL PARTIAL KNEE REPLACEMENT Right 01/56/1537   NISSEN FUNDOPLICATION  94/32/7614   OOPHORECTOMY     PARTIAL KNEE ARTHROPLASTY Left 04/05/2014    MEDICATIONS:  apixaban (ELIQUIS) 5 MG TABS tablet   cetirizine (ZYRTEC) 10 MG tablet   Cholecalciferol (VITAMIN D-3) 1000 units CAPS   clonazePAM (KLONOPIN) 0.5 MG tablet   Cyanocobalamin (B-12 PO)   diclofenac Sodium (VOLTAREN) 1 % GEL   diltiazem (CARDIZEM) 30 MG tablet   flecainide (TAMBOCOR) 50 MG tablet   FLUoxetine (PROZAC) 10 MG tablet   gabapentin (NEURONTIN) 300 MG capsule   losartan (COZAAR) 50 MG tablet   metoprolol succinate (TOPROL-XL) 25 MG 24 hr tablet   traMADol (ULTRAM) 50 MG tablet   No current facility-administered medications for  this encounter.   - Pt to stop eliquis 3 days before surgery   If no changes, I anticipate pt can proceed with surgery as scheduled.   Willeen Cass, PhD, FNP-BC [facility]/Anesthesiology Phone: [phone] 05/03/2021 2:36 PM

## 2021-05-03 NOTE — Telephone Encounter (Signed)
-----   Message from Richardo Priest, MD sent at 05/03/2021  7:40 AM EDT ----- Normal or stable result  Good result proBNP level is quite low does not indicate problems like heart failure

## 2021-05-03 NOTE — Anesthesia Preprocedure Evaluation (Addendum)
Anesthesia Evaluation  Patient identified by MRN, date of birth, ID band Patient awake    Reviewed: Allergy & Precautions, NPO status , Patient's Chart, lab work & pertinent test results, reviewed documented beta blocker date and time   Airway Mallampati: I       Dental no notable dental hx.    Pulmonary former smoker,    Pulmonary exam normal        Cardiovascular hypertension, Pt. on medications and Pt. on home beta blockers Normal cardiovascular exam+ dysrhythmias      Neuro/Psych PSYCHIATRIC DISORDERS Anxiety Depression    GI/Hepatic   Endo/Other    Renal/GU      Musculoskeletal  (+) Arthritis , Osteoarthritis,    Abdominal Normal abdominal exam  (+)   Peds  Hematology   Anesthesia Other Findings   Reproductive/Obstetrics                            Anesthesia Physical Anesthesia Plan  ASA: 2  Anesthesia Plan: Spinal   Post-op Pain Management:  Regional for Post-op pain   Induction:   PONV Risk Score and Plan: 2 and Ondansetron, Dexamethasone and Midazolam  Airway Management Planned: Natural Airway, Simple Face Mask and Nasal Cannula  Additional Equipment: None  Intra-op Plan:   Post-operative Plan:   Informed Consent: I have reviewed the patients History and Physical, chart, labs and discussed the procedure including the risks, benefits and alternatives for the proposed anesthesia with the patient or authorized representative who has indicated his/her understanding and acceptance.       Plan Discussed with: CRNA  Anesthesia Plan Comments: (See APP note by Durel Salts, FNP )       Anesthesia Quick Evaluation

## 2021-05-03 NOTE — Telephone Encounter (Signed)
Left message on patients voicemail to please return our call.   

## 2021-05-03 NOTE — Telephone Encounter (Signed)
-----   Message from Richardo Priest, MD sent at 05/03/2021 12:20 PM EDT ----- Normal or stable result  Her echo is normal proceed with her planned orthopedic surgery

## 2021-05-06 ENCOUNTER — Other Ambulatory Visit: Payer: Self-pay | Admitting: Sports Medicine

## 2021-05-06 MED ORDER — GABAPENTIN 300 MG PO CAPS: 300.0000 mg | ORAL_CAPSULE | Freq: Two times a day (BID) | ORAL | 3 refills | Status: DC

## 2021-05-07 ENCOUNTER — Other Ambulatory Visit (HOSPITAL_COMMUNITY)
Admission: RE | Admit: 2021-05-07 | Discharge: 2021-05-07 | Disposition: A | Discharge status: Home / Self Care | Payer: Medicare Other | Source: Ambulatory Visit | Attending: Orthopedic Surgery | Admitting: Orthopedic Surgery

## 2021-05-07 DIAGNOSIS — Z01812 Encounter for preprocedural laboratory examination: Secondary | ICD-10-CM | POA: Insufficient documentation

## 2021-05-07 DIAGNOSIS — Z20822 Contact with and (suspected) exposure to covid-19: Secondary | ICD-10-CM | POA: Insufficient documentation

## 2021-05-07 LAB — SARS CORONAVIRUS 2 (TAT 6-24 HRS): SARS Coronavirus 2: NEGATIVE

## 2021-05-09 MED ORDER — BUPIVACAINE LIPOSOME 1.3 % IJ SUSP
20.0000 mL | Freq: Once | INTRAMUSCULAR | Status: DC
  Filled 2021-05-09: qty 20

## 2021-05-09 NOTE — H&P (Signed)
TOTAL KNEE REVISION ADMISSION H&P  Patient is being admitted for left revision total knee arthroplasty.  Subjective:  Chief Complaint:left knee pain.  HPI: Melissa Rodgers, 70 y.o. female, has a history of pain and functional disability in the left knee(s) due to failed previous arthroplasty and patient has failed non-surgical conservative treatments for greater than 12 weeks to include NSAID's and/or analgesics, flexibility and strengthening excercises, use of assistive devices, and activity modification. The indications for the revision of the total knee arthroplasty are loosening of one or more components. Onset of symptoms was gradual starting 1 years ago with gradually worsening course since that time.  Prior procedures on the left knee(s) include unicompartmental arthroplasty.  Patient currently rates pain in the left knee(s) at 9 out of 10 with activity. There is night pain, worsening of pain with activity and weight bearing, pain that interferes with activities of daily living, and joint swelling.  Patient has evidence of prosthetic loosening by imaging studies. This condition presents safety issues increasing the risk of falls. This patient has had failure of unicompartmental arthroplasty.  There is no current active infection.  Patient Active Problem List   Diagnosis Date Noted   DDD (degenerative disc disease), cervical 12/03/2020   Severe pain of left shoulder 11/05/2020   Fibromyalgia 03/05/2020   Knee pain, acute 03/05/2020   Osteoarthritis 03/05/2020   Diverticulitis of sigmoid colon 08/01/2019   Generalized anxiety disorder with panic attacks 08/01/2019   Unspecified atrial fibrillation 06/18/2019   Major depressive disorder 09/09/2018   Dry eye syndrome of both eyes 09/09/2018   Microscopic hematuria 09/09/2018   Non-seasonal allergic rhinitis 09/09/2018   Hyperlipidemia 09/09/2018   Essential hypertension 09/09/2018   History of palpitations 09/09/2018   Chronic bilateral  low back pain without sciatica 10/07/2016   Chronic bilateral upper abdominal pain 10/07/2016   Primary osteoarthritis of both knees post bilateral hemiarthroplasty 10/07/2016   Candidosis of skin 04/17/2016   Cherry angioma 04/17/2016   Dry skin 04/17/2016   Dermal nevus of left forearm 04/17/2016   Multiple benign nevi 04/17/2016   Scar condition and fibrosis of skin 04/17/2016   Sebaceous hyperplasia 04/17/2016   Seborrheic keratoses 04/17/2016   Skin tags, multiple acquired 04/17/2016   Solar lentigo 04/17/2016   S/P laparoscopic fundoplication 48/18/5631   Gastroesophageal reflux disease without esophagitis 11/06/2015   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   Primary osteoarthritis of right knee 11/21/2014   Left knee DJD 03/27/2014   Palpitations 09/28/2013   BMI 30.0-30.9,adult 01/27/2013   Subarachnoid hemorrhage following injury 2010   Past Medical History:  Diagnosis Date   Anemia    Atrial fibrillation 06/18/2019   BMI 30.0-30.9,adult 01/27/2013   Candidosis of skin 04/17/2016   Formatting of this note might be different from the original. 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   Dermal nevus of left forearm 04/17/2016   Diverticulitis of sigmoid colon 08/01/2019   Dry eye syndrome of both eyes 09/09/2018   Dry skin 04/17/2016   Dysrhythmia    Afib   Essential hypertension 09/09/2018   Fibromyalgia    Gastroesophageal reflux disease without esophagitis 11/06/2015   Generalized anxiety disorder with panic attacks 08/01/2019   History of palpitations 09/09/2018   Hyperlipidemia 09/09/2018   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   Knee pain, acute 03/05/2020   Left knee DJD 03/27/2014  Major depressive disorder 09/09/2018   Microscopic hematuria 09/09/2018   Multiple benign nevi 04/17/2016    Non-seasonal allergic rhinitis 09/09/2018   Osteoarthritis    Palpitations 09/28/2013   Formatting of this note is different from the original. 01/29/15:  24 Hour Holter Monitor CONCLUSIONS: 1. Overall normal 24-hour Holter monitor recording, with rare, brief,  asymptomatic nonsustained supraventricular tachycardia as described.  2. No cardiac rhythm explanation for patient's symptoms.       Primary osteoarthritis of both knees 10/07/2016   Formatting of this note might be different from the original. 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   Primary osteoarthritis of right knee 11/21/2014   S/P laparoscopic fundoplication 0/08/2724   Scar condition and fibrosis of skin 04/17/2016   Formatting of this note might be different from the original. lower abdomen   Sebaceous hyperplasia 04/17/2016   Seborrheic keratoses 04/17/2016   Skin tags, multiple acquired 04/17/2016   Solar lentigo 04/17/2016   Subarachnoid hemorrhage following injury 2010   Fall from horse; brief hospitalization    Past Surgical History:  Procedure Laterality Date   ESOPHAGOGASTRODUODENOSCOPY     possibly last one was 2017. In [place]   HERNIA REPAIR     MEDIAL PARTIAL KNEE REPLACEMENT Right 36/64/4034   NISSEN FUNDOPLICATION  74/25/9563   OOPHORECTOMY     PARTIAL KNEE ARTHROPLASTY Left 04/05/2014    Current Facility-Administered Medications  Medication Dose Route Frequency Provider Last Rate Last Admin   [START ON 05/10/2021] bupivacaine liposome (EXPAREL) 1.3 % injection 266 mg  20 mL Other Once Dorna Leitz, MD       Current Outpatient Medications  Medication Sig Dispense Refill Last Dose   cetirizine (ZYRTEC) 10 MG tablet Take 10 mg by mouth daily.      Cholecalciferol (VITAMIN D-3) 1000 units CAPS Take 1,000 Units by mouth daily.      clonazePAM (KLONOPIN) 0.5 MG tablet Take 0.5 mg by mouth daily as needed for anxiety.      Cyanocobalamin (B-12 PO) Take 1 tablet by mouth daily.       diclofenac Sodium (VOLTAREN) 1 % GEL Apply 2 g topically daily as needed (knee pain).      diltiazem (CARDIZEM) 30 MG tablet Take 1 tablet (30 mg total) by mouth every 4 (four) hours as needed. 90 tablet 3    flecainide (TAMBOCOR) 50 MG tablet TAKE 1 TABLET(50 MG) BY MOUTH TWICE DAILY 180 tablet 2    FLUoxetine (PROZAC) 10 MG tablet Take 10 mg by mouth daily.      losartan (COZAAR) 50 MG tablet Take 25 mg by mouth in the morning.      metoprolol succinate (TOPROL-XL) 25 MG 24 hr tablet TAKE 1 TABLET(25 MG) BY MOUTH DAILY 90 tablet 2    traMADol (ULTRAM) 50 MG tablet Take 1-2 tablets (50-100 mg total) by mouth every 8 (eight) hours as needed for moderate pain. Maximum 6 tabs per day. 30 tablet 0    apixaban (ELIQUIS) 5 MG TABS tablet Take 5 mg by mouth 2 (two) times daily.      gabapentin (NEURONTIN) 300 MG capsule Take 1 capsule (300 mg total) by mouth 2 (two) times daily. 180 capsule 3    Allergies  Allergen Reactions   Rosuvastatin Other (See Comments)    Musculoskeletal Aches Muscle spasms   Sulfa Antibiotics Rash    Social History   Tobacco Use   Smoking status: Former    Pack years: 0.00  Types: Cigarettes    Quit date: 08/18/1983    Years since quitting: 37.7   Smokeless tobacco: Never  Substance Use Topics   Alcohol use: Yes    Alcohol/week: 7.0 - 14.0 standard drinks    Types: 7 - 14 Glasses of wine per week    Comment: 1-2 drinks per night, usually wine    Family History  Problem Relation Age of Onset   Diabetes Mother    Heart disease Mother    Alzheimer's disease Father        Symptom onset in late 49s   Atrial fibrillation Sister    Tremor Other        Significant maternal history of tremor; unknown regarding Parkinson's disease diagnoses   Colon cancer Neg Hx    Esophageal cancer Neg Hx       Review of Systems  ROS: I have reviewed the patient's review of systems thoroughly and there are no positive responses as relates to the HPI.  Objective:  Physical  Exam  Vital signs in last 24 hours:   Well-developed well-nourished patient in no acute distress. Alert and oriented x3 HEENT:within normal limits Cardiac: Regular rate and rhythm Pulmonary: Lungs clear to auscultation Abdomen: Soft and nontender.  Normal active bowel sounds  Musculoskeletal: (left knee: Pain to range of motion.  Limited range of motion.  A plus effusion.  No erythema or warmth. Labs: Recent Results (from the past 2160 hour(s))  Surgical pcr screen     Status: Abnormal   Collection Time: 04/29/21 11:23 AM   Specimen: Nasal Mucosa; Nasal Swab  Result Value Ref Range   MRSA, PCR NEGATIVE NEGATIVE   Staphylococcus aureus POSITIVE (A) NEGATIVE    Comment: (NOTE) The Xpert SA Assay (FDA approved for NASAL specimens in patients 18 years of age and older), is one component of a comprehensive surveillance program. It is not intended to diagnose infection nor to guide or monitor treatment. Performed at [facility], [place] [address], [place], [place] [address]   CBC WITH DIFFERENTIAL     Status: Abnormal   Collection Time: 04/29/21 11:23 AM  Result Value Ref Range   WBC 7.8 4.0 - 10.5 K/uL   RBC 4.97 3.87 - 5.11 MIL/uL   Hemoglobin 14.7 12.0 - 15.0 g/dL   HCT 46.2 (H) 36.0 - 46.0 %   MCV 93.0 80.0 - 100.0 fL   MCH 29.6 26.0 - 34.0 pg   MCHC 31.8 30.0 - 36.0 g/dL   RDW 13.2 11.5 - 15.5 %   Platelets 302 150 - 400 K/uL   nRBC 0.0 0.0 - 0.2 %   Neutrophils Relative % 53 %   Neutro Abs 4.2 1.7 - 7.7 K/uL   Lymphocytes Relative 34 %   Lymphs Abs 2.6 0.7 - 4.0 K/uL   Monocytes Relative 8 %   Monocytes Absolute 0.6 0.1 - 1.0 K/uL   Eosinophils Relative 3 %   Eosinophils Absolute 0.3 0.0 - 0.5 K/uL   Basophils Relative 1 %   Basophils Absolute 0.1 0.0 - 0.1 K/uL   Immature Granulocytes 1 %   Abs Immature Granulocytes 0.04 0.00 - 0.07 K/uL    Comment: Performed at [facility], [place] [address], [place],  [address]   Comprehensive metabolic panel     Status: None   Collection Time: 04/29/21 11:23 AM  Result Value Ref Range   Sodium 137 135 - 145 mmol/L   Potassium 3.9 3.5 - 5.1 mmol/L  Chloride 102 98 - 111 mmol/L   CO2 28 22 - 32 mmol/L   Glucose, Bld 95 70 - 99 mg/dL    Comment: Glucose reference range applies only to samples taken after fasting for at least 8 hours.   BUN 14 8 - 23 mg/dL   Creatinine, Ser 0.60 0.44 - 1.00 mg/dL   Calcium 9.1 8.9 - 10.3 mg/dL   Total Protein 7.9 6.5 - 8.1 g/dL   Albumin 4.3 3.5 - 5.0 g/dL   AST 25 15 - 41 U/L   ALT 26 0 - 44 U/L   Alkaline Phosphatase 63 38 - 126 U/L   Total Bilirubin 0.7 0.3 - 1.2 mg/dL   GFR, Estimated >60 >60 mL/min    Comment: (NOTE) Calculated using the CKD-EPI Creatinine Equation (2021)    Anion gap 7 5 - 15    Comment: Performed at [facility], [place] [address], [place], [place] [address]  Protime-INR     Status: None   Collection Time: 04/29/21 11:23 AM  Result Value Ref Range   Prothrombin Time 15.1 11.4 - 15.2 seconds   INR 1.2 0.8 - 1.2    Comment: (NOTE) INR goal varies based on device and disease states. Performed at [facility], [place] [address], [place], [place] [address]   APTT     Status: Abnormal   Collection Time: 04/29/21 11:23 AM  Result Value Ref Range   aPTT 46 (H) 24 - 36 seconds    Comment:        IF BASELINE aPTT IS ELEVATED, SUGGEST PATIENT RISK ASSESSMENT BE USED TO DETERMINE APPROPRIATE ANTICOAGULANT THERAPY. Performed at [facility], [place] [address], [place], [place] [address]   Urinalysis, Routine w reflex microscopic Urine, Clean Catch     Status: Abnormal   Collection Time: 04/29/21 11:23 AM  Result Value Ref Range   Color, Urine STRAW (A) YELLOW   APPearance CLEAR CLEAR   Specific Gravity, Urine 1.005 1.005 - 1.030   pH 7.0 5.0 - 8.0   Glucose, UA NEGATIVE NEGATIVE mg/dL   Hgb urine dipstick SMALL (A) NEGATIVE   Bilirubin Urine  NEGATIVE NEGATIVE   Ketones, ur NEGATIVE NEGATIVE mg/dL   Protein, ur NEGATIVE NEGATIVE mg/dL   Nitrite NEGATIVE NEGATIVE   Leukocytes,Ua NEGATIVE NEGATIVE   RBC / HPF 0-5 0 - 5 RBC/hpf   WBC, UA 0-5 0 - 5 WBC/hpf   Bacteria, UA NONE SEEN NONE SEEN    Comment: Performed at [facility], [place] [address] [place], [place] [address]  Type and screen Order type and screen if day of surgery is less than 15 days from draw of preadmission visit or order morning of surgery if day of surgery is greater than 6 days from preadmission visit.     Status: None   Collection Time: 04/29/21 11:23 AM  Result Value Ref Range   ABO/RH(D) O POS    Antibody Screen NEG    Sample Expiration 05/13/2021,2359    Extend sample reason      NO TRANSFUSIONS OR PREGNANCY IN THE PAST 3 MONTHS Performed at [facility], [place] [address] [place], [place] [address]   Pro b natriuretic peptide (BNP)     Status: None   Collection Time: 05/02/21 11:14 AM  Result Value Ref Range   NT-Pro BNP 48 0 - 301 pg/mL    Comment: The following cut-points have been suggested for the use of proBNP for the  diagnostic evaluation of heart failure (HF) in patients with acute dyspnea: Modality                     Age           Optimal Cut                            (years)            Point ------------------------------------------------------ Diagnosis (rule in HF)        <50            450 pg/mL                           50 - 75            900 pg/mL                               >75           1800 pg/mL Exclusion (rule out HF)  Age independent     300 pg/mL   ECHOCARDIOGRAM COMPLETE     Status: None   Collection Time: 05/03/21 11:48 AM  Result Value Ref Range   S' Lateral 2.50 cm   Single Plane A4C EF 53.0 %   Area-P 1/2 2.76 cm2   P 1/2 time 748 msec  SARS CORONAVIRUS 2 (TAT 6-24 HRS) Nasopharyngeal Nasopharyngeal Swab     Status: None   Collection Time: 05/07/21 10:45 AM   Specimen:  Nasopharyngeal Swab  Result Value Ref Range   SARS Coronavirus 2 NEGATIVE NEGATIVE    Comment: (NOTE) SARS-CoV-2 target nucleic acids are NOT DETECTED.  The SARS-CoV-2 RNA is generally detectable in upper and lower respiratory specimens during the acute phase of infection. Negative results do not preclude SARS-CoV-2 infection, do not rule out co-infections with other pathogens, and should not be used as the sole basis for treatment or other patient management decisions. Negative results must be combined with clinical observations, patient history, and epidemiological information. The expected result is Negative.  Fact Sheet for Patients: SugarRoll.be  Fact Sheet for Healthcare Providers: https://www.woods-mathews.com/  This test is not yet approved or cleared by the [place] FDA and  has been authorized for detection and/or diagnosis of SARS-CoV-2 by FDA under an Emergency Use Authorization (EUA). This EUA will remain  in effect (meaning this test can be used) for the duration of the COVID-19 declaration under Se ction 564(b)(1) of the Act, 21 U.S.C. section 360bbb-3(b)(1), unless the authorization is terminated or revoked sooner.  Performed at [facility], [place] [address], [place], [place] [address]     Estimated body mass index is 32.59 kg/m as calculated from the following:   Height as of 05/02/21: 5\' 3"  (1.6 m).   Weight as of 05/02/21: 83.5 kg.  Imaging Review Plain radiographs demonstrate severe degenerative joint disease of the left knee(s). The overall alignment is neutral.There is evidence of loosening of the tibial components. The bone quality appears to be fair for age and reported activity level. There is therapy unicompartmental knee replacement by way of loosening of the tibial component by bone scan.    Assessment/Plan:  End stage arthritis, left knee(s) with failed previous arthroplasty.   The patient  history, physical examination, clinical judgment of the provider and imaging studies are  consistent with end stage degenerative joint disease of the left knee(s), previous total knee arthroplasty. Revision total knee arthroplasty is deemed medically necessary. The treatment options including medical management, injection therapy, arthroscopy and revision arthroplasty were discussed at length. The risks and benefits of revision total knee arthroplasty were presented and reviewed. The risks due to aseptic loosening, infection, stiffness, patella tracking problems, thromboembolic complications and other imponderables were discussed. The patient acknowledged the explanation, agreed to proceed with the plan and consent was signed. Patient is being admitted for inpatient treatment for surgery, pain control, PT, OT, prophylactic antibiotics, VTE prophylaxis, progressive ambulation and ADL's and discharge planning.The patient is planning to be discharged home with home health services

## 2021-05-10 ENCOUNTER — Inpatient Hospital Stay (HOSPITAL_COMMUNITY): Payer: Medicare Other | Admitting: Emergency Medicine

## 2021-05-10 ENCOUNTER — Encounter (HOSPITAL_COMMUNITY)
Admission: RE | Disposition: A | Discharge status: Home / Self Care | Payer: Self-pay | Source: Home / Self Care | Attending: Orthopedic Surgery

## 2021-05-10 ENCOUNTER — Inpatient Hospital Stay (HOSPITAL_COMMUNITY)
Admission: RE | Admit: 2021-05-10 | Discharge: 2021-05-11 | DRG: 468 | Disposition: A | Discharge status: Home Health | Payer: Medicare Other | Attending: Orthopedic Surgery | Admitting: Orthopedic Surgery

## 2021-05-10 ENCOUNTER — Inpatient Hospital Stay (HOSPITAL_COMMUNITY): Payer: Medicare Other | Admitting: Physician Assistant

## 2021-05-10 ENCOUNTER — Other Ambulatory Visit: Payer: Self-pay

## 2021-05-10 ENCOUNTER — Encounter (HOSPITAL_COMMUNITY): Payer: Self-pay | Admitting: Orthopedic Surgery

## 2021-05-10 DIAGNOSIS — Z20822 Contact with and (suspected) exposure to covid-19: Secondary | ICD-10-CM | POA: Diagnosis not present

## 2021-05-10 DIAGNOSIS — T84033A 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, initial encounter: Secondary | ICD-10-CM | POA: Diagnosis not present

## 2021-05-10 DIAGNOSIS — Z79899 Other long term (current) drug therapy: Secondary | ICD-10-CM

## 2021-05-10 DIAGNOSIS — K219 Gastro-esophageal reflux disease without esophagitis: Secondary | ICD-10-CM | POA: Diagnosis present

## 2021-05-10 DIAGNOSIS — G8929 Other chronic pain: Secondary | ICD-10-CM | POA: Diagnosis not present

## 2021-05-10 DIAGNOSIS — F329 Major depressive disorder, single episode, unspecified: Secondary | ICD-10-CM | POA: Diagnosis present

## 2021-05-10 DIAGNOSIS — T84038A Mechanical loosening of other internal prosthetic joint, initial encounter: Secondary | ICD-10-CM | POA: Diagnosis present

## 2021-05-10 DIAGNOSIS — Z96651 Presence of right artificial knee joint: Secondary | ICD-10-CM | POA: Diagnosis present

## 2021-05-10 DIAGNOSIS — I4891 Unspecified atrial fibrillation: Secondary | ICD-10-CM | POA: Diagnosis not present

## 2021-05-10 DIAGNOSIS — Y838 Other surgical procedures as the cause of abnormal reaction of the patient, or of later complication, without mention of misadventure at the time of the procedure: Secondary | ICD-10-CM | POA: Diagnosis present

## 2021-05-10 DIAGNOSIS — Z87891 Personal history of nicotine dependence: Secondary | ICD-10-CM | POA: Diagnosis not present

## 2021-05-10 DIAGNOSIS — I10 Essential (primary) hypertension: Secondary | ICD-10-CM | POA: Diagnosis not present

## 2021-05-10 DIAGNOSIS — Z96652 Presence of left artificial knee joint: Secondary | ICD-10-CM | POA: Diagnosis not present

## 2021-05-10 DIAGNOSIS — Z7901 Long term (current) use of anticoagulants: Secondary | ICD-10-CM

## 2021-05-10 DIAGNOSIS — Z9181 History of falling: Secondary | ICD-10-CM | POA: Diagnosis not present

## 2021-05-10 DIAGNOSIS — Z8249 Family history of ischemic heart disease and other diseases of the circulatory system: Secondary | ICD-10-CM | POA: Diagnosis not present

## 2021-05-10 DIAGNOSIS — M797 Fibromyalgia: Secondary | ICD-10-CM | POA: Diagnosis present

## 2021-05-10 DIAGNOSIS — F411 Generalized anxiety disorder: Secondary | ICD-10-CM | POA: Diagnosis present

## 2021-05-10 DIAGNOSIS — M25562 Pain in left knee: Secondary | ICD-10-CM | POA: Diagnosis not present

## 2021-05-10 DIAGNOSIS — M170 Bilateral primary osteoarthritis of knee: Secondary | ICD-10-CM | POA: Diagnosis present

## 2021-05-10 DIAGNOSIS — G8918 Other acute postprocedural pain: Secondary | ICD-10-CM | POA: Diagnosis not present

## 2021-05-10 DIAGNOSIS — M545 Low back pain, unspecified: Secondary | ICD-10-CM | POA: Diagnosis not present

## 2021-05-10 DIAGNOSIS — Y792 Prosthetic and other implants, materials and accessory orthopedic devices associated with adverse incidents: Secondary | ICD-10-CM | POA: Diagnosis present

## 2021-05-10 DIAGNOSIS — E785 Hyperlipidemia, unspecified: Secondary | ICD-10-CM | POA: Diagnosis not present

## 2021-05-10 DIAGNOSIS — Z882 Allergy status to sulfonamides status: Secondary | ICD-10-CM

## 2021-05-10 DIAGNOSIS — Z96659 Presence of unspecified artificial knee joint: Secondary | ICD-10-CM | POA: Diagnosis present

## 2021-05-10 DIAGNOSIS — Z888 Allergy status to other drugs, medicaments and biological substances status: Secondary | ICD-10-CM | POA: Diagnosis not present

## 2021-05-10 DIAGNOSIS — F410 Panic disorder [episodic paroxysmal anxiety] without agoraphobia: Secondary | ICD-10-CM | POA: Diagnosis not present

## 2021-05-10 DIAGNOSIS — T8484XA Pain due to internal orthopedic prosthetic devices, implants and grafts, initial encounter: Secondary | ICD-10-CM | POA: Diagnosis not present

## 2021-05-10 HISTORY — PX: TOTAL KNEE REVISION: SHX996

## 2021-05-10 HISTORY — DX: 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, initial encounter: T84.033A

## 2021-05-10 LAB — APTT: aPTT: 36 seconds (ref 24–36)

## 2021-05-10 LAB — ABO/RH: ABO/RH(D): O POS

## 2021-05-10 SURGERY — TOTAL KNEE REVISION
Anesthesia: Spinal | Site: Knee | Laterality: Left

## 2021-05-10 MED ORDER — DOCUSATE SODIUM 100 MG PO CAPS
100.0000 mg | ORAL_CAPSULE | Freq: Two times a day (BID) | ORAL | Status: DC
  Administered 2021-05-10 – 2021-05-11 (×2): 100 mg via ORAL
  Filled 2021-05-10 (×2): qty 1

## 2021-05-10 MED ORDER — TRAMADOL HCL 50 MG PO TABS
50.0000 mg | ORAL_TABLET | Freq: Four times a day (QID) | ORAL | Status: DC
Start: 2021-05-10 — End: 2021-05-11
  Administered 2021-05-10 – 2021-05-11 (×4): 50 mg via ORAL
  Filled 2021-05-10 (×4): qty 1

## 2021-05-10 MED ORDER — LACTATED RINGERS IV SOLN: INTRAVENOUS | Status: DC

## 2021-05-10 MED ORDER — ORAL CARE MOUTH RINSE: 15.0000 mL | Freq: Once | OROMUCOSAL | Status: AC

## 2021-05-10 MED ORDER — BISACODYL 5 MG PO TBEC: 5.0000 mg | DELAYED_RELEASE_TABLET | Freq: Every day | ORAL | Status: DC | PRN

## 2021-05-10 MED ORDER — BUPIVACAINE LIPOSOME 1.3 % IJ SUSP
INTRAMUSCULAR | Status: DC | PRN
  Administered 2021-05-10: 20 mL

## 2021-05-10 MED ORDER — SODIUM CHLORIDE 0.9% FLUSH
INTRAVENOUS | Status: DC | PRN
  Administered 2021-05-10: 50 mL

## 2021-05-10 MED ORDER — PROPOFOL 10 MG/ML IV BOLUS
INTRAVENOUS | Status: AC
  Filled 2021-05-10: qty 20

## 2021-05-10 MED ORDER — DIPHENHYDRAMINE HCL 12.5 MG/5ML PO ELIX: 12.5000 mg | ORAL_SOLUTION | ORAL | Status: DC | PRN

## 2021-05-10 MED ORDER — SODIUM CHLORIDE 0.9 % IR SOLN
Status: DC | PRN
  Administered 2021-05-10 (×2): 1000 mL

## 2021-05-10 MED ORDER — TRANEXAMIC ACID-NACL 1000-0.7 MG/100ML-% IV SOLN
1000.0000 mg | INTRAVENOUS | Status: AC
  Administered 2021-05-10: 1000 mg via INTRAVENOUS
  Filled 2021-05-10: qty 100

## 2021-05-10 MED ORDER — OXYCODONE-ACETAMINOPHEN 5-325 MG PO TABS: 1.0000 | ORAL_TABLET | Freq: Four times a day (QID) | ORAL | 0 refills | Status: DC | PRN

## 2021-05-10 MED ORDER — ACETAMINOPHEN 10 MG/ML IV SOLN: 1000.0000 mg | Freq: Once | INTRAVENOUS | Status: DC | PRN

## 2021-05-10 MED ORDER — HYDROMORPHONE HCL 1 MG/ML IJ SOLN: 0.5000 mg | INTRAMUSCULAR | Status: DC | PRN

## 2021-05-10 MED ORDER — OXYCODONE HCL 5 MG PO TABS
ORAL_TABLET | ORAL | Status: AC
  Filled 2021-05-10: qty 1

## 2021-05-10 MED ORDER — PHENOL 1.4 % MT LIQD: 1.0000 | OROMUCOSAL | Status: DC | PRN

## 2021-05-10 MED ORDER — ACETAMINOPHEN 160 MG/5ML PO SOLN: 325.0000 mg | ORAL | Status: DC | PRN

## 2021-05-10 MED ORDER — APIXABAN 5 MG PO TABS
5.0000 mg | ORAL_TABLET | Freq: Two times a day (BID) | ORAL | Status: DC
  Administered 2021-05-11: 5 mg via ORAL
  Filled 2021-05-10: qty 1

## 2021-05-10 MED ORDER — CEFAZOLIN SODIUM-DEXTROSE 2-4 GM/100ML-% IV SOLN
2.0000 g | Freq: Four times a day (QID) | INTRAVENOUS | Status: AC
  Administered 2021-05-10 (×2): 2 g via INTRAVENOUS
  Filled 2021-05-10 (×2): qty 100

## 2021-05-10 MED ORDER — FLUOXETINE HCL 10 MG PO CAPS
10.0000 mg | ORAL_CAPSULE | Freq: Every day | ORAL | Status: DC
  Administered 2021-05-11: 10 mg via ORAL
  Filled 2021-05-10: qty 1

## 2021-05-10 MED ORDER — ALBUMIN HUMAN 5 % IV SOLN: INTRAVENOUS | Status: DC | PRN

## 2021-05-10 MED ORDER — MENTHOL 3 MG MT LOZG: 1.0000 | LOZENGE | OROMUCOSAL | Status: DC | PRN

## 2021-05-10 MED ORDER — OXYCODONE HCL 5 MG PO TABS
5.0000 mg | ORAL_TABLET | ORAL | Status: DC | PRN
  Administered 2021-05-10 – 2021-05-11 (×2): 5 mg via ORAL
  Filled 2021-05-10 (×2): qty 1

## 2021-05-10 MED ORDER — POLYETHYLENE GLYCOL 3350 17 G PO PACK: 17.0000 g | PACK | Freq: Every day | ORAL | Status: DC | PRN

## 2021-05-10 MED ORDER — EPHEDRINE SULFATE-NACL 50-0.9 MG/10ML-% IV SOSY
PREFILLED_SYRINGE | INTRAVENOUS | Status: DC | PRN
  Administered 2021-05-10: 5 mg via INTRAVENOUS
  Administered 2021-05-10: 10 mg via INTRAVENOUS

## 2021-05-10 MED ORDER — ONDANSETRON HCL 4 MG/2ML IJ SOLN: 4.0000 mg | Freq: Once | INTRAMUSCULAR | Status: DC | PRN

## 2021-05-10 MED ORDER — ONDANSETRON HCL 4 MG PO TABS: 4.0000 mg | ORAL_TABLET | Freq: Four times a day (QID) | ORAL | Status: DC | PRN

## 2021-05-10 MED ORDER — FLECAINIDE ACETATE 50 MG PO TABS
50.0000 mg | ORAL_TABLET | Freq: Two times a day (BID) | ORAL | Status: DC
  Administered 2021-05-10 – 2021-05-11 (×2): 50 mg via ORAL
  Filled 2021-05-10 (×2): qty 1

## 2021-05-10 MED ORDER — GABAPENTIN 300 MG PO CAPS
300.0000 mg | ORAL_CAPSULE | Freq: Two times a day (BID) | ORAL | Status: DC
  Administered 2021-05-10 – 2021-05-11 (×2): 300 mg via ORAL
  Filled 2021-05-10 (×2): qty 1

## 2021-05-10 MED ORDER — MAGNESIUM CITRATE PO SOLN: 1.0000 | Freq: Once | ORAL | Status: DC | PRN

## 2021-05-10 MED ORDER — LOSARTAN POTASSIUM 25 MG PO TABS
25.0000 mg | ORAL_TABLET | Freq: Every day | ORAL | Status: DC
  Administered 2021-05-11: 25 mg via ORAL
  Filled 2021-05-10: qty 1

## 2021-05-10 MED ORDER — PROPOFOL 10 MG/ML IV BOLUS
INTRAVENOUS | Status: DC | PRN
  Administered 2021-05-10 (×2): 30 mg via INTRAVENOUS

## 2021-05-10 MED ORDER — METHOCARBAMOL 500 MG PO TABS: 500.0000 mg | ORAL_TABLET | Freq: Four times a day (QID) | ORAL | Status: DC | PRN

## 2021-05-10 MED ORDER — CLONAZEPAM 0.5 MG PO TABS: 0.5000 mg | ORAL_TABLET | Freq: Two times a day (BID) | ORAL | Status: DC | PRN

## 2021-05-10 MED ORDER — CHLORHEXIDINE GLUCONATE 0.12 % MT SOLN
15.0000 mL | Freq: Once | OROMUCOSAL | Status: AC
  Administered 2021-05-10: 15 mL via OROMUCOSAL

## 2021-05-10 MED ORDER — ONDANSETRON HCL 4 MG/2ML IJ SOLN
INTRAMUSCULAR | Status: DC | PRN
  Administered 2021-05-10: 4 mg via INTRAVENOUS

## 2021-05-10 MED ORDER — TIZANIDINE HCL 2 MG PO TABS: 2.0000 mg | ORAL_TABLET | Freq: Three times a day (TID) | ORAL | 0 refills | Status: DC | PRN

## 2021-05-10 MED ORDER — PROPOFOL 500 MG/50ML IV EMUL
INTRAVENOUS | Status: DC | PRN
  Administered 2021-05-10: 75 ug/kg/min via INTRAVENOUS

## 2021-05-10 MED ORDER — ACETAMINOPHEN 325 MG PO TABS: 325.0000 mg | ORAL_TABLET | ORAL | Status: DC | PRN

## 2021-05-10 MED ORDER — MEPERIDINE HCL 50 MG/ML IJ SOLN: 6.2500 mg | INTRAMUSCULAR | Status: DC | PRN

## 2021-05-10 MED ORDER — POVIDONE-IODINE 10 % EX SWAB: 2.0000 "application " | Freq: Once | CUTANEOUS | Status: DC

## 2021-05-10 MED ORDER — DEXAMETHASONE SODIUM PHOSPHATE 10 MG/ML IJ SOLN
10.0000 mg | Freq: Two times a day (BID) | INTRAMUSCULAR | Status: AC
  Administered 2021-05-10 – 2021-05-11 (×2): 10 mg via INTRAVENOUS
  Filled 2021-05-10 (×2): qty 1

## 2021-05-10 MED ORDER — SODIUM CHLORIDE 0.9 % IV SOLN: INTRAVENOUS | Status: DC

## 2021-05-10 MED ORDER — CLONIDINE HCL (ANALGESIA) 100 MCG/ML EP SOLN
EPIDURAL | Status: DC | PRN
  Administered 2021-05-10: 100 ug

## 2021-05-10 MED ORDER — ACETAMINOPHEN 325 MG PO TABS: 325.0000 mg | ORAL_TABLET | Freq: Four times a day (QID) | ORAL | Status: DC | PRN

## 2021-05-10 MED ORDER — CEFAZOLIN SODIUM-DEXTROSE 2-4 GM/100ML-% IV SOLN
2.0000 g | INTRAVENOUS | Status: AC
  Administered 2021-05-10: 2 g via INTRAVENOUS
  Filled 2021-05-10: qty 100

## 2021-05-10 MED ORDER — DOCUSATE SODIUM 100 MG PO CAPS: 100.0000 mg | ORAL_CAPSULE | Freq: Two times a day (BID) | ORAL | 0 refills | Status: DC

## 2021-05-10 MED ORDER — PHENYLEPHRINE HCL-NACL 10-0.9 MG/250ML-% IV SOLN
INTRAVENOUS | Status: DC | PRN
  Administered 2021-05-10: 50 ug/min via INTRAVENOUS

## 2021-05-10 MED ORDER — ALUM & MAG HYDROXIDE-SIMETH 200-200-20 MG/5ML PO SUSP: 30.0000 mL | ORAL | Status: DC | PRN

## 2021-05-10 MED ORDER — BUPIVACAINE-EPINEPHRINE 0.25% -1:200000 IJ SOLN
INTRAMUSCULAR | Status: DC | PRN
  Administered 2021-05-10: 30 mL

## 2021-05-10 MED ORDER — FENTANYL CITRATE (PF) 100 MCG/2ML IJ SOLN
25.0000 ug | Freq: Once | INTRAMUSCULAR | Status: AC
  Administered 2021-05-10 (×2): 50 ug via INTRAVENOUS
  Filled 2021-05-10: qty 2

## 2021-05-10 MED ORDER — ROPIVACAINE HCL 7.5 MG/ML IJ SOLN
INTRAMUSCULAR | Status: DC | PRN
  Administered 2021-05-10 (×4): 5 mL via PERINEURAL

## 2021-05-10 MED ORDER — BUPIVACAINE-EPINEPHRINE (PF) 0.25% -1:200000 IJ SOLN
INTRAMUSCULAR | Status: AC
  Filled 2021-05-10: qty 30

## 2021-05-10 MED ORDER — OXYCODONE HCL 5 MG/5ML PO SOLN: 5.0000 mg | Freq: Once | ORAL | Status: AC | PRN

## 2021-05-10 MED ORDER — MIDAZOLAM HCL 2 MG/2ML IJ SOLN
0.5000 mg | Freq: Once | INTRAMUSCULAR | Status: AC
  Administered 2021-05-10 (×2): 1 mg via INTRAVENOUS
  Filled 2021-05-10: qty 2

## 2021-05-10 MED ORDER — ROPIVACAINE HCL 5 MG/ML IJ SOLN
INTRAMUSCULAR | Status: DC | PRN
  Administered 2021-05-10 (×2): 5 mL via EPIDURAL

## 2021-05-10 MED ORDER — BUPIVACAINE IN DEXTROSE 0.75-8.25 % IT SOLN
INTRATHECAL | Status: DC | PRN
  Administered 2021-05-10: 1.6 mL via INTRATHECAL

## 2021-05-10 MED ORDER — OXYCODONE HCL 5 MG PO TABS
5.0000 mg | ORAL_TABLET | Freq: Once | ORAL | Status: AC | PRN
  Administered 2021-05-10: 5 mg via ORAL

## 2021-05-10 MED ORDER — METOPROLOL SUCCINATE ER 25 MG PO TB24
12.5000 mg | ORAL_TABLET | Freq: Every day | ORAL | Status: DC
  Administered 2021-05-11: 12.5 mg via ORAL
  Filled 2021-05-10: qty 1

## 2021-05-10 MED ORDER — METHOCARBAMOL 500 MG IVPB - SIMPLE MED
500.0000 mg | Freq: Four times a day (QID) | INTRAVENOUS | Status: DC | PRN
  Filled 2021-05-10: qty 50

## 2021-05-10 MED ORDER — ONDANSETRON HCL 4 MG/2ML IJ SOLN: 4.0000 mg | Freq: Four times a day (QID) | INTRAMUSCULAR | Status: DC | PRN

## 2021-05-10 MED ORDER — FENTANYL CITRATE (PF) 100 MCG/2ML IJ SOLN: 25.0000 ug | INTRAMUSCULAR | Status: DC | PRN

## 2021-05-10 MED ORDER — TRANEXAMIC ACID-NACL 1000-0.7 MG/100ML-% IV SOLN
1000.0000 mg | Freq: Once | INTRAVENOUS | Status: AC
  Administered 2021-05-10: 1000 mg via INTRAVENOUS
  Filled 2021-05-10: qty 100

## 2021-05-10 MED ORDER — DEXAMETHASONE SODIUM PHOSPHATE 10 MG/ML IJ SOLN
INTRAMUSCULAR | Status: DC | PRN
  Administered 2021-05-10: 10 mg

## 2021-05-10 SURGICAL SUPPLY — 63 items
ATTUNE MED DOME PAT 38 KNEE (Knees) ×2 IMPLANT
ATTUNE PS FEM LT SZ 4 CEM KNEE (Femur) ×2 IMPLANT
ATTUNE PSRP INSR SZ4 10 KNEE (Insert) ×2 IMPLANT
BAG DECANTER FOR FLEXI CONT (MISCELLANEOUS) ×2 IMPLANT
BAG ZIPLOCK 12X15 (MISCELLANEOUS) ×2 IMPLANT
BASEPLATE TIBIAL ROTATING SZ 4 (Knees) ×2 IMPLANT
BENZOIN TINCTURE PRP APPL 2/3 (GAUZE/BANDAGES/DRESSINGS) ×2 IMPLANT
BLADE SAGITTAL 25.0X1.19X90 (BLADE) ×2 IMPLANT
BLADE SAW SGTL 13.0X1.19X90.0M (BLADE) ×2 IMPLANT
BLADE SAW SGTL 81X20 HD (BLADE) ×2 IMPLANT
BLADE SURG SZ10 CARB STEEL (BLADE) ×4 IMPLANT
BNDG ELASTIC 4X5.8 VLCR STR LF (GAUZE/BANDAGES/DRESSINGS) ×2 IMPLANT
BNDG ELASTIC 6X5.8 VLCR STR LF (GAUZE/BANDAGES/DRESSINGS) ×2 IMPLANT
BOOTIES KNEE HIGH SLOAN (MISCELLANEOUS) ×2 IMPLANT
BOWL SMART MIX CTS (DISPOSABLE) ×2 IMPLANT
CEMENT HV SMART SET (Cement) ×4 IMPLANT
COVER WAND RF STERILE (DRAPES) IMPLANT
DECANTER SPIKE VIAL GLASS SM (MISCELLANEOUS) IMPLANT
DRAPE U-SHAPE 47X51 STRL (DRAPES) ×2 IMPLANT
DRSG AQUACEL AG ADV 3.5X10 (GAUZE/BANDAGES/DRESSINGS) ×2 IMPLANT
DRSG PAD ABDOMINAL 8X10 ST (GAUZE/BANDAGES/DRESSINGS) ×2 IMPLANT
ELECT REM PT RETURN 15FT ADLT (MISCELLANEOUS) ×2 IMPLANT
GAUZE SPONGE 4X4 12PLY STRL (GAUZE/BANDAGES/DRESSINGS) ×2 IMPLANT
GLOVE SRG 8 PF TXTR STRL LF DI (GLOVE) ×2 IMPLANT
GLOVE SURG LTX SZ7.5 (GLOVE) ×4 IMPLANT
GLOVE SURG UNDER POLY LF SZ8 (GLOVE) ×2
GOWN STRL REUS W/TWL XL LVL3 (GOWN DISPOSABLE) ×4 IMPLANT
HANDPIECE INTERPULSE COAX TIP (DISPOSABLE) ×1
HOLDER FOLEY CATH W/STRAP (MISCELLANEOUS) ×2 IMPLANT
HOOD PEEL AWAY FLYTE STAYCOOL (MISCELLANEOUS) ×6 IMPLANT
IMMOBILIZER KNEE 20 (SOFTGOODS) ×2
IMMOBILIZER KNEE 20 THIGH 36 (SOFTGOODS) ×1 IMPLANT
KIT TURNOVER KIT A (KITS) ×2 IMPLANT
MANIFOLD NEPTUNE II (INSTRUMENTS) ×2 IMPLANT
NDL SAFETY ECLIPSE 18X1.5 (NEEDLE) ×1 IMPLANT
NEEDLE HYPO 18GX1.5 SHARP (NEEDLE) ×1
NS IRRIG 1000ML POUR BTL (IV SOLUTION) ×2 IMPLANT
PACK TOTAL KNEE CUSTOM (KITS) ×2 IMPLANT
PADDING CAST COTTON 6X4 STRL (CAST SUPPLIES) ×2 IMPLANT
PENCIL SMOKE EVACUATOR (MISCELLANEOUS) ×2 IMPLANT
PIN DRILL FIX HALF THREAD (BIT) ×2 IMPLANT
PIN STEINMAN FIXATION KNEE (PIN) ×2 IMPLANT
PROTECTOR NERVE ULNAR (MISCELLANEOUS) ×2 IMPLANT
SET HNDPC FAN SPRY TIP SCT (DISPOSABLE) ×1 IMPLANT
STAPLER VISISTAT 35W (STAPLE) IMPLANT
STRIP CLOSURE SKIN 1/2X4 (GAUZE/BANDAGES/DRESSINGS) ×2 IMPLANT
SUT VIC AB 0 CT1 27 (SUTURE) ×2
SUT VIC AB 0 CT1 27XBRD ANTBC (SUTURE) ×2 IMPLANT
SUT VIC AB 0 CT1 36 (SUTURE) ×2 IMPLANT
SUT VIC AB 1 CT1 27 (SUTURE) ×2
SUT VIC AB 1 CT1 27XBRD ANTBC (SUTURE) ×2 IMPLANT
SUT VIC AB 2-0 CT1 27 (SUTURE) ×1
SUT VIC AB 2-0 CT1 TAPERPNT 27 (SUTURE) ×1 IMPLANT
SWAB COLLECTION DEVICE MRSA (MISCELLANEOUS) ×2 IMPLANT
SWAB CULTURE ESWAB REG 1ML (MISCELLANEOUS) ×2 IMPLANT
SYR 3ML LL SCALE MARK (SYRINGE) IMPLANT
TOWER CARTRIDGE SMART MIX (DISPOSABLE) IMPLANT
TRAY FOLEY MTR SLVR 16FR STAT (SET/KITS/TRAYS/PACK) ×2 IMPLANT
TUBE SUCTION HIGH CAP CLEAR NV (SUCTIONS) ×2 IMPLANT
TUBING CONNECTING 10 (TUBING) ×2 IMPLANT
WATER STERILE IRR 1000ML POUR (IV SOLUTION) ×4 IMPLANT
WRAP KNEE MAXI GEL POST OP (GAUZE/BANDAGES/DRESSINGS) ×2 IMPLANT
YANKAUER SUCT BULB TIP NO VENT (SUCTIONS) ×2 IMPLANT

## 2021-05-10 NOTE — Discharge Instructions (Signed)

## 2021-05-10 NOTE — Progress Notes (Signed)
Assisted Dr. Lyn Hollingshead with Left Knee Adductor Canal block. Side rails up, monitors on throughout procedure. See vital signs in flow sheet. Tolerated Procedure well.

## 2021-05-10 NOTE — Progress Notes (Signed)
Orthopedic Tech Progress Note Patient Details:  Melissa Rodgers 07/31/1951 [id number]  Patient ID: Melissa Rodgers, female   DOB: 07-17-1951, 70 y.o.   MRN: [id number]  Kennis Carina 05/10/2021, 3:22 PM Cpm applied in room @1520 

## 2021-05-10 NOTE — Interval H&P Note (Signed)
History and Physical Interval Note:  05/10/2021 10:47 AM  Melissa Rodgers  has presented today for surgery, with the diagnosis of LEFT KNEE PAIN AFTER UNICOMPARTMENTAL KNEE REPLACEMENT.  The various methods of treatment have been discussed with the patient and family. After consideration of risks, benefits and other options for treatment, the patient has consented to  Procedure(s): TOTAL KNEE REVISION (Left) as a surgical intervention.  The patient's history has been reviewed, patient examined, no change in status, stable for surgery.  I have reviewed the patient's chart and labs.  Questions were answered to the patient's satisfaction.     [name]

## 2021-05-10 NOTE — Anesthesia Procedure Notes (Addendum)
Spinal  Patient location during procedure: OR Start time: 05/10/2021 11:34 AM End time: 05/10/2021 11:37 AM Reason for block: surgical anesthesia Staffing Performed: anesthesiologist  Anesthesiologist: Freddrick March, MD Preanesthetic Checklist Completed: patient identified, IV checked, risks and benefits discussed, surgical consent, monitors and equipment checked, pre-op evaluation and timeout performed Spinal Block Patient position: sitting Prep: DuraPrep and site prepped and draped Patient monitoring: cardiac monitor, continuous pulse ox and blood pressure Approach: midline Location: L3-4 Injection technique: single-shot Needle Needle type: Pencan  Needle gauge: 24 G Needle length: 9 cm Assessment Sensory level: T6 Events: CSF return and second provider Additional Notes Functioning IV was confirmed and monitors were applied. Sterile prep and drape, including hand hygiene and sterile gloves were used. The patient was positioned and the spine was prepped. The skin was anesthetized with lidocaine.  Free flow of clear CSF was obtained prior to injecting local anesthetic into the CSF.  The spinal needle aspirated freely following injection.  The needle was carefully withdrawn.  The patient tolerated the procedure well.

## 2021-05-10 NOTE — Anesthesia Postprocedure Evaluation (Signed)
Anesthesia Post Note  Patient: Melissa Rodgers  Procedure(s) Performed: TOTAL KNEE REVISION (Left: Knee)     Patient location during evaluation: PACU Anesthesia Type: Spinal Level of consciousness: awake Pain management: pain level controlled Vital Signs Assessment: post-procedure vital signs reviewed and stable Respiratory status: spontaneous breathing Cardiovascular status: stable Postop Assessment: no apparent nausea or vomiting Anesthetic complications: no   No notable events documented.  Last Vitals:  Vitals:   05/10/21 1345 05/10/21 1400  BP: 128/74 99/66  Pulse: 75 76  Resp: 20 (!) 8  Temp:    SpO2: 93% 92%    Last Pain:  Vitals:   05/10/21 1339  TempSrc:   PainSc: 0-No pain                 Avant Printy

## 2021-05-10 NOTE — Evaluation (Signed)
Physical Therapy Evaluation Patient Details Name: Melissa Rodgers MRN: [id number] DOB: 11/19/1951 Today's Date: 05/10/2021   History of Present Illness  Patient is 70 y.o. 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    Clinical Impression  Melissa Rodgers is a 70 y.o. female POD 0 s/p Lt TKR. Patient reports independence with mobility at baseline. Patient is now limited by functional impairments (see PT problem list below) and requires min-mod assist for transfers with RW. Patient was limited by slight dizziness in standing that resolved with seated rest. Pt able to complete stand step transfer with RW and min assist. Patient instructed in exercise to facilitate circulation to manage edema and reduce risk of DVT. Patient will benefit from continued skilled PT interventions to address impairments and progress towards PLOF. Acute PT will follow to progress mobility and stair training in preparation for safe discharge home.     Follow Up Recommendations Follow surgeon's recommendation for DC plan and follow-up therapies;Home health PT    Equipment Recommendations  None recommended by PT    Recommendations for Other Services       Precautions / Restrictions Precautions Precautions: Fall Restrictions Weight Bearing Restrictions: No LLE Weight Bearing: Weight bearing as tolerated      Mobility  Bed Mobility Overal bed mobility: Needs Assistance Bed Mobility: Supine to Sit     Supine to sit: Min assist;HOB elevated     General bed mobility comments: cues for use of bed rail, assist for Lt LE to move to EOB.    Transfers Overall transfer level: Needs assistance Equipment used: Rolling walker (2 wheeled) Transfers: Sit to/from [facility] Sit to Stand: Min assist Stand pivot transfers: Mod assist       General transfer comment: Cues for hand placement for power up. Pt c/o  lightheadedness after first rise, resolved in sitting. pt attempted to stand again and no dizziness on 2nd. Mod assist to facilitate knee extension on Lt in stance phase for stane step/pivot bed>chair.  Ambulation/Gait                Stairs            Wheelchair Mobility    Modified Rankin (Stroke Patients Only)       Balance Overall balance assessment: Needs assistance Sitting-balance support: Feet supported Sitting balance-Leahy Scale: Good     Standing balance support: During functional activity;Bilateral upper extremity supported Standing balance-Leahy Scale: Poor                               Pertinent Vitals/Pain Pain Assessment: 0-10 Pain Score: 6  Pain Location: Lt knee Pain Descriptors / Indicators: Aching Pain Intervention(s): Limited activity within patient's tolerance;Monitored during session;Repositioned;Ice applied    Home Living Family/patient expects to be discharged to:: Private residence Living Arrangements: Spouse/significant other Available Help at Discharge: Family Type of Home: House Home Access: Stairs to enter Entrance Stairs-Rails: Right;Left;Can reach both Entrance Stairs-Number of Steps: 6 Home Layout: One level Home Equipment: Environmental consultant - 2 wheels;Cane - single point;Toilet riser;Shower seat - built in;Grab bars - tub/shower      Prior Function Level of Independence: Independent         Comments: SPC for uneven surfaces     Hand Dominance   Dominant Hand: Right    Extremity/Trunk Assessment   Upper Extremity Assessment Upper Extremity  Assessment: Overall WFL for tasks assessed    Lower Extremity Assessment Lower Extremity Assessment: LLE deficits/detail LLE Deficits / Details: good quad activation, no extensor lag with SLR LLE Sensation: WNL LLE Coordination: WNL    Cervical / Trunk Assessment Cervical / Trunk Assessment: Normal  Communication   Communication: No difficulties  Cognition  Arousal/Alertness: Awake/alert Behavior During Therapy: WFL for tasks assessed/performed Overall Cognitive Status: Within Functional Limits for tasks assessed                                        General Comments      Exercises Total Joint Exercises Ankle Circles/Pumps: AROM;Both;20 reps;Seated   Assessment/Plan    PT Assessment Patient needs continued PT services  PT Problem List Decreased strength;Decreased range of motion;Decreased activity tolerance;Decreased balance;Decreased mobility;Decreased knowledge of use of DME;Decreased knowledge of precautions       PT Treatment Interventions DME instruction;Gait training;Stair training;Functional mobility training;Therapeutic activities;Therapeutic exercise;Balance training;Patient/family education    PT Goals (Current goals can be found in the Care Plan section)  Acute Rehab PT Goals Patient Stated Goal: get back in garden PT Goal Formulation: With patient Time For Goal Achievement: 05/17/21 Potential to Achieve Goals: Good    Frequency 7X/week   Barriers to discharge        Co-evaluation               AM-PAC PT "6 Clicks" Mobility  Outcome Measure Help needed turning from your back to your side while in a flat bed without using bedrails?: None Help needed moving from lying on your back to sitting on the side of a flat bed without using bedrails?: A Little Help needed moving to and from a bed to a chair (including a wheelchair)?: A Little Help needed standing up from a chair using your arms (e.g., wheelchair or bedside chair)?: A Little Help needed to walk in hospital room?: A Lot Help needed climbing 3-5 steps with a railing? : A Lot 6 Click Score: 17    End of Session Equipment Utilized During Treatment: Gait belt Activity Tolerance: Patient tolerated treatment well Patient left: in chair;with call bell/phone within reach;with chair alarm set Nurse Communication: Mobility status PT Visit  Diagnosis: Muscle weakness (generalized) (M62.81);Difficulty in walking, not elsewhere classified (R26.2)    Time: 1610-9604 PT Time Calculation (min) (ACUTE ONLY): 24 min   Charges:   PT Evaluation $PT Eval Low Complexity: 1 Low PT Treatments $Therapeutic Activity: 8-22 mins        Verner Mould, DPT Acute Rehabilitation Services Office [phone] Pager [phone]   Jacques Navy 05/10/2021, 6:55 PM

## 2021-05-10 NOTE — Anesthesia Procedure Notes (Addendum)
  Anesthesia Regional Block: Adductor canal block   Pre-Anesthetic Checklist: , timeout performed,  Correct Patient, Correct Site, Correct Laterality,  Correct Procedure, Correct Position, site marked,  Risks and benefits discussed,  Surgical consent,  Pre-op evaluation,  At surgeon's request and post-op pain management  Laterality: Lower and Right  Prep: chloraprep       Needles:  Injection technique: Single-shot  Needle Type: Echogenic Stimulator Needle     Needle Length: 9cm  Needle Gauge: 20   Needle insertion depth: 3 cm   Additional Needles:   Procedures:,,,, ultrasound used (permanent image in chart),,    Narrative:  Start time: 05/10/2021 10:45 AM End time: 05/10/2021 10:55 AM Injection made incrementally with aspirations every 5 mL.  Performed by: Personally  Anesthesiologist: Lyn Hollingshead, MD

## 2021-05-10 NOTE — Transfer of Care (Signed)
Immediate Anesthesia Transfer of Care Note  Patient: Melissa Rodgers  Procedure(s) Performed: TOTAL KNEE REVISION (Left: Knee)  Patient Location: PACU  Anesthesia Type:Spinal  Level of Consciousness: awake, alert  and oriented  Airway & Oxygen Therapy: Patient Spontanous Breathing and Patient connected to face mask  Post-op Assessment: Report given to RN and Post -op Vital signs reviewed and stable  Post vital signs: Reviewed and stable  Last Vitals:  Vitals Value Taken Time  BP 91/55 05/10/21 1339  Temp    Pulse 83 05/10/21 1341  Resp 17 05/10/21 1341  SpO2 92 % 05/10/21 1341  Vitals shown include unvalidated device data.  Last Pain:  Vitals:   05/10/21 0929  TempSrc: Oral  PainSc: 2       Patients Stated Pain Goal: 3 (76/72/09 4709)  Complications: No notable events documented.

## 2021-05-11 LAB — CBC
HCT: 37.1 % (ref 36.0–46.0)
Hemoglobin: 11.7 g/dL — ABNORMAL LOW (ref 12.0–15.0)
MCH: 29 pg (ref 26.0–34.0)
MCHC: 31.5 g/dL (ref 30.0–36.0)
MCV: 91.8 fL (ref 80.0–100.0)
Platelets: 239 10*3/uL (ref 150–400)
RBC: 4.04 MIL/uL (ref 3.87–5.11)
RDW: 13.4 % (ref 11.5–15.5)
WBC: 15.1 10*3/uL — ABNORMAL HIGH (ref 4.0–10.5)
nRBC: 0 % (ref 0.0–0.2)

## 2021-05-11 NOTE — Discharge Summary (Signed)
Patient ID: Melissa Rodgers MRN: [id number] DOB/AGE: 1951/01/08 70 y.o.  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 01/29/15:  24 Hour Holter Monitor CONCLUSIONS: 1. Overall normal 24-hour Holter monitor recording, with rare, brief,  asymptomatic nonsustained supraventricular tachycardia as described.  2. No cardiac rhythm explanation for patient's symptoms.       Primary osteoarthritis of both knees 10/07/2016   Formatting of this note might be different from the original. 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   Primary osteoarthritis of right knee 11/21/2014   S/P laparoscopic fundoplication 05/23/5283   Scar condition and fibrosis of skin 04/17/2016   Formatting of this note might be different from the original. lower abdomen   Sebaceous hyperplasia 04/17/2016   Seborrheic keratoses 04/17/2016   Skin tags, multiple acquired 04/17/2016   Solar lentigo 04/17/2016   Subarachnoid hemorrhage following injury 2010   Fall from horse; brief hospitalization    Surgeries: Procedure(s): TOTAL KNEE REVISION on 05/10/2021   Consultants:   Discharged Condition: Improved  Hospital Course: Melissa Rodgers is an 70 y.o. female who was admitted 05/10/2021 for operative treatment ofLoosening of unicondylar knee replacement ([place]). Patient has severe unremitting pain that affects sleep, daily activities, and work/hobbies. After pre-op clearance the patient was taken to the operating room on 05/10/2021 and underwent  Procedure(s): TOTAL KNEE REVISION.    Patient was given perioperative antibiotics:  Anti-infectives (From admission, onward)    Start     Dose/Rate Route Frequency Ordered Stop   05/10/21 1800  ceFAZolin (ANCEF) IVPB 2g/100 mL premix        2 g 200 mL/hr over 30 Minutes Intravenous Every 6 hours 05/10/21 1458 05/11/21 0026   05/10/21 0930  ceFAZolin (ANCEF) IVPB 2g/100 mL premix        2 g 200 mL/hr over 30 Minutes Intravenous On call to O.R. 05/10/21 0920 05/10/21 1155        Patient was given sequential compression devices, early ambulation, and chemoprophylaxis to prevent DVT.  Patient benefited maximally from hospital stay and there were no complications.  Recent vital signs: Patient Vitals for the past 24 hrs:  BP Temp Temp src Pulse Resp SpO2 Height Weight  05/11/21 0554 105/65 97.6 F (36.4 C) -- 60 18 96 % -- --  05/11/21 0157 102/79 98 F (36.7 C) Oral 68 18 94 % -- --  05/10/21 2137 106/67 97.8 F (36.6 C) Oral 80 18 95 %  -- --  05/10/21 1811 118/60 97.8 F (36.6 C) Oral 77 16 97 % -- --  05/10/21 1800 -- -- -- -- -- -- 5\' 3"  (1.6 m) 83 kg  05/10/21 1510 (!) 98/55 98.7 F (37.1 C) Axillary 78 16 96 % -- --  05/10/21 1445 102/60 (!) 97.5 F (36.4 C) -- 79 (!) 8 94 % -- --  05/10/21 1430 103/63 -- -- 80 (!) 8 94 % -- --  05/10/21 1415 104/63 -- -- 81 10 94 % -- --  05/10/21 1400 99/66 -- -- 76 (!) 8 92 % -- --  05/10/21 1345 128/74 -- -- 75 20 93 % -- --  05/10/21 1339 (!) 91/55 97.8 F (36.6 C) -- 81 18 95 % -- --  05/10/21 1114 (!) 111/56 -- -- 68 12 98 % -- --  05/10/21 1109 (!) 114/58 -- -- 66 13 98 % -- --  05/10/21 1104 109/61 -- -- 69 13 96 % -- --  05/10/21 1059 106/64 -- -- 70 14 99 % -- --  05/10/21 1054 105/66 -- -- 62 12 98 % -- --  05/10/21 1049 101/63 -- -- (!) 57 10 97 % -- --  05/10/21 1044 (!) 145/79 -- -- 62 14 100 % -- --  05/10/21 1039 (!) 141/76 -- -- 60 20 97 % -- --     Recent laboratory studies:  Recent Labs    05/11/21 0342  WBC 15.1*  HGB 11.7*  HCT 37.1  PLT 239     Discharge Medications:   Allergies as of 05/11/2021       Reactions   Rosuvastatin Other (See Comments)   Musculoskeletal Aches Muscle spasms   Sulfa Antibiotics Rash        Medication List     STOP taking these medications    diclofenac Sodium 1 % Gel Commonly known as: VOLTAREN       TAKE these medications    B-12 PO Take 1 tablet by mouth daily.   cetirizine 10 MG tablet Commonly known as: ZYRTEC Take 10 mg by mouth daily.   clonazePAM 0.5 MG tablet Commonly known as: KLONOPIN Take 0.5 mg by mouth daily as needed for anxiety.   diltiazem 30 MG tablet Commonly known as: Cardizem Take 1 tablet (30 mg total) by mouth every 4 (four) hours as needed.   docusate sodium 100 MG capsule Commonly known as: Colace Take 1 capsule (100 mg total) by mouth 2 (two) times daily.   Eliquis 5 MG Tabs tablet Generic drug: apixaban Take 5 mg by mouth 2 (two) times daily.   flecainide  50 MG tablet Commonly known as: TAMBOCOR TAKE 1 TABLET(50 MG) BY MOUTH TWICE DAILY   FLUoxetine 10 MG tablet Commonly known as: PROZAC Take 10 mg by mouth daily.   gabapentin 300 MG capsule Commonly known as: NEURONTIN Take 1 capsule (300 mg total) by mouth 2 (two) times daily.   losartan 50 MG tablet Commonly known as: COZAAR Take 25 mg by mouth in the morning.   metoprolol succinate 25 MG 24 hr tablet Commonly known as: TOPROL-XL TAKE 1  TABLET(25 MG) BY MOUTH DAILY   oxyCODONE-acetaminophen 5-325 MG tablet Commonly known as: PERCOCET/ROXICET Take 1-2 tablets by mouth every 6 (six) hours as needed for severe pain.   tiZANidine 2 MG tablet Commonly known as: ZANAFLEX Take 1 tablet (2 mg total) by mouth every 8 (eight) hours as needed for muscle spasms.   traMADol 50 MG tablet Commonly known as: ULTRAM Take 1-2 tablets (50-100 mg total) by mouth every 8 (eight) hours as needed for moderate pain. Maximum 6 tabs per day.   Vitamin D-3 25 MCG (1000 UT) Caps Take 1,000 Units by mouth daily.               Durable Medical Equipment  (From admission, onward)           Start     Ordered   05/10/21 1458  DME Walker rolling  Once       Question:  Patient needs a walker to treat with the following condition  Answer:  Status post revision of total replacement of left knee   05/10/21 1458   05/10/21 1458  DME 3 n 1  Once        05/10/21 1458            Diagnostic Studies: DG Chest 2 View  Result Date: 04/29/2021 CLINICAL DATA:  Preoperative evaluation for knee replacement surgery EXAM: CHEST - 2 VIEW COMPARISON:  None FINDINGS: Upper normal heart size. Mediastinal contours and pulmonary vascularity normal. Lungs clear. No pulmonary infiltrate, pleural effusion, or pneumothorax. Biconvex thoracolumbar scoliosis. IMPRESSION: No acute abnormalities. Electronically Signed   By: Lavonia Dana M.D.   On: 04/29/2021 17:05   ECHOCARDIOGRAM COMPLETE  Result Date:  05/03/2021    ECHOCARDIOGRAM REPORT   Patient Name:   Melissa Rodgers Date of Exam: 05/03/2021 Medical Rec #:  [id number]      Height:       63.0 in Accession #:    [id number]     Weight:       184.0 lb Date of Birth:  03/02/1951      BSA:          1.866 m Patient Age:    3 years       BP:           120/70 mmHg Patient Gender: F              HR:           64 bpm. Exam Location:  Outpatient Procedure: 2D Echo, Cardiac Doppler and Color Doppler Indications:    Pre-op evaluation  History:        Patient has no prior history of Echocardiogram examinations.                 Arrythmias:Atrial Fibrillation, Signs/Symptoms:Shortness of                 Breath; Risk Factors:Hypertension. Pre-op knee replacement.  Sonographer:    Dustin Flock Referring Phys: [phone] [place]  1. Left ventricular ejection fraction, by estimation, is 60 to 65%. The left ventricle has normal function. The left ventricle has no regional wall motion abnormalities. Left ventricular diastolic parameters are consistent with Grade I diastolic dysfunction (impaired relaxation).  2. Right ventricular systolic function is normal. The right ventricular size is normal. There is normal pulmonary artery systolic pressure. The estimated right ventricular systolic pressure is 40.3 mmHg.  3. The mitral valve is normal in structure. Mild mitral valve regurgitation. No evidence  of mitral stenosis.  4. The aortic valve is normal in structure. Aortic valve regurgitation is mild. Mild aortic valve sclerosis is present, with no evidence of aortic valve stenosis. Aortic regurgitation PHT measures 748 msec.  5. The inferior vena cava is normal in size with greater than 50% respiratory variability, suggesting right atrial pressure of 3 mmHg. FINDINGS  Left Ventricle: Left ventricular ejection fraction, by estimation, is 60 to 65%. The left ventricle has normal function. The left ventricle has no regional wall motion abnormalities. The left ventricular  internal cavity size was normal in size. There is  no left ventricular hypertrophy. Left ventricular diastolic parameters are consistent with Grade I diastolic dysfunction (impaired relaxation). Right Ventricle: The right ventricular size is normal. No increase in right ventricular wall thickness. Right ventricular systolic function is normal. There is normal pulmonary artery systolic pressure. The tricuspid regurgitant velocity is 2.35 m/s, and  with an assumed right atrial pressure of 3 mmHg, the estimated right ventricular systolic pressure is 66.0 mmHg. Left Atrium: Left atrial size was normal in size. Right Atrium: Right atrial size was normal in size. Pericardium: There is no evidence of pericardial effusion. Mitral Valve: The mitral valve is normal in structure. Mild mitral annular calcification. Mild mitral valve regurgitation. No evidence of mitral valve stenosis. Tricuspid Valve: The tricuspid valve is normal in structure. Tricuspid valve regurgitation is mild . No evidence of tricuspid stenosis. Aortic Valve: The aortic valve is normal in structure. Aortic valve regurgitation is mild. Aortic regurgitation PHT measures 748 msec. Mild aortic valve sclerosis is present, with no evidence of aortic valve stenosis. Pulmonic Valve: The pulmonic valve was normal in structure. Pulmonic valve regurgitation is mild. No evidence of pulmonic stenosis. Aorta: The aortic root is normal in size and structure. Venous: The inferior vena cava is normal in size with greater than 50% respiratory variability, suggesting right atrial pressure of 3 mmHg. IAS/Shunts: No atrial level shunt detected by color flow Doppler.  LEFT VENTRICLE PLAX 2D LVIDd:         4.40 cm     Diastology LVIDs:         2.50 cm     LV e' medial:    4.79 cm/s LV PW:         1.00 cm     LV E/e' medial:  14.2 LV IVS:        1.00 cm     LV e' lateral:   5.00 cm/s LVOT diam:     2.00 cm     LV E/e' lateral: 13.6 LV SV:         56 LV SV Index:   30 LVOT Area:      3.14 cm  LV Volumes (MOD) LV vol d, MOD A4C: 98.5 ml LV vol s, MOD A4C: 46.3 ml LV SV MOD A4C:     98.5 ml RIGHT VENTRICLE RV Basal diam:  2.60 cm RV S prime:     8.27 cm/s TAPSE (M-mode): 2.5 cm LEFT ATRIUM             Index       RIGHT ATRIUM           Index LA diam:        3.50 cm 1.88 cm/m  RA Area:     11.80 cm LA Vol (A2C):   28.3 ml 15.16 ml/m RA Volume:   26.80 ml  14.36 ml/m LA Vol (A4C):   44.1 ml 23.63 ml/m LA Biplane  Vol: 37.2 ml 19.93 ml/m  AORTIC VALVE LVOT Vmax:   81.60 cm/s LVOT Vmean:  56.700 cm/s LVOT VTI:    0.178 m AI PHT:      748 msec  AORTA Ao Root diam: 2.80 cm MITRAL VALVE               TRICUSPID VALVE MV Area (PHT): 2.76 cm    TR Peak grad:   22.1 mmHg MV Decel Time: 275 msec    TR Vmax:        235.00 cm/s MV E velocity: 68.10 cm/s MV A velocity: 93.00 cm/s  SHUNTS MV E/A ratio:  0.73        Systemic VTI:  0.18 m                            Systemic Diam: 2.00 cm Candee Furbish MD Electronically signed by Candee Furbish MD Signature Date/Time: 05/03/2021/12:03:58 PM    Final     Disposition: Discharge disposition: 01-Home or Self Care       Discharge Instructions     Call MD / Call 911   Complete by: As directed    If you experience chest pain or shortness of breath, CALL 911 and be transported to the hospital emergency room.  If you develope a fever above 101 F, pus (white drainage) or increased drainage or redness at the wound, or calf pain, call your surgeon's office.   Constipation Prevention   Complete by: As directed    Drink plenty of fluids.  Prune juice may be helpful.  You may use a stool softener, such as Colace (over the counter) 100 mg twice a day.  Use MiraLax (over the counter) for constipation as needed.   Diet - low sodium heart healthy   Complete by: As directed    Discharge instructions   Complete by: As directed    INSTRUCTIONS AFTER JOINT REPLACEMENT   Remove items at home which could result in a fall. This includes throw rugs or furniture in  walking pathways ICE to the affected joint every three hours while awake for 30 minutes at a time, for at least the first 3-5 days, and then as needed for pain and swelling.  Continue to use ice for pain and swelling. You may notice swelling that will progress down to the foot and ankle.  This is normal after surgery.  Elevate your leg when you are not up walking on it.   Continue to use the breathing machine you got in the hospital (incentive spirometer) which will help keep your temperature down.  It is common for your temperature to cycle up and down following surgery, especially at night when you are not up moving around and exerting yourself.  The breathing machine keeps your lungs expanded and your temperature down.   DIET:  As you were doing prior to hospitalization, we recommend a well-balanced diet.  DRESSING / WOUND CARE / SHOWERING  You may shower 3 days after surgery, but keep the wounds dry during showering.  You may use an occlusive plastic wrap (Press'n Seal for example), NO SOAKING/SUBMERGING IN THE BATHTUB.  If the bandage gets wet, change with a clean dry gauze.  If the incision gets wet, pat the wound dry with a clean towel.  ACTIVITY  Increase activity slowly as tolerated, but follow the weight bearing instructions below.   No driving for 6 weeks or until further direction given by  your physician.  You cannot drive while taking narcotics.  No lifting or carrying greater than 10 lbs. until further directed by your surgeon. Avoid periods of inactivity such as sitting longer than an hour when not asleep. This helps prevent blood clots.  You may return to work once you are authorized by your doctor.     WEIGHT BEARING   Weight bearing as tolerated with assist device (walker, cane, etc) as directed, use it as long as suggested by your surgeon or therapist, typically at least 4-6 weeks.   EXERCISES  Results after joint replacement surgery are often greatly improved when you  follow the exercise, range of motion and muscle strengthening exercises prescribed by your doctor. Safety measures are also important to protect the joint from further injury. Any time any of these exercises cause you to have increased pain or swelling, decrease what you are doing until you are comfortable again and then slowly increase them. If you have problems or questions, call your caregiver or physical therapist for advice.   Rehabilitation is important following a joint replacement. After just a few days of immobilization, the muscles of the leg can become weakened and shrink (atrophy).  These exercises are designed to build up the tone and strength of the thigh and leg muscles and to improve motion. Often times heat used for twenty to thirty minutes before working out will loosen up your tissues and help with improving the range of motion but do not use heat for the first two weeks following surgery (sometimes heat can increase post-operative swelling).   These exercises can be done on a training (exercise) mat, on the floor, on a table or on a bed. Use whatever works the best and is most comfortable for you.    Use music or television while you are exercising so that the exercises are a pleasant break in your day. This will make your life better with the exercises acting as a break in your routine that you can look forward to.   Perform all exercises about fifteen times, three times per day or as directed.  You should exercise both the operative leg and the other leg as well.  Exercises include:   Quad Sets - Tighten up the muscle on the front of the thigh (Quad) and hold for 5-10 seconds.   Straight Leg Raises - With your knee straight (if you were given a brace, keep it on), lift the leg to 60 degrees, hold for 3 seconds, and slowly lower the leg.  Perform this exercise against resistance later as your leg gets stronger.  Leg Slides: Lying on your back, slowly slide your foot toward your  buttocks, bending your knee up off the floor (only go as far as is comfortable). Then slowly slide your foot back down until your leg is flat on the floor again.  Angel Wings: Lying on your back spread your legs to the side as far apart as you can without causing discomfort.  Hamstring Strength:  Lying on your back, push your heel against the floor with your leg straight by tightening up the muscles of your buttocks.  Repeat, but this time bend your knee to a comfortable angle, and push your heel against the floor.  You may put a pillow under the heel to make it more comfortable if necessary.   A rehabilitation program following joint replacement surgery can speed recovery and prevent re-injury in the future due to weakened muscles. Contact your doctor or a  physical therapist for more information on knee rehabilitation.    CONSTIPATION  Constipation is defined medically as fewer than three stools per week and severe constipation as less than one stool per week.  Even if you have a regular bowel pattern at home, your normal regimen is likely to be disrupted due to multiple reasons following surgery.  Combination of anesthesia, postoperative narcotics, change in appetite and fluid intake all can affect your bowels.   YOU MUST use at least one of the following options; they are listed in order of increasing strength to get the job done.  They are all available over the counter, and you may need to use some, POSSIBLY even all of these options:    Drink plenty of fluids (prune juice may be helpful) and high fiber foods Colace 100 mg by mouth twice a day  Senokot for constipation as directed and as needed Dulcolax (bisacodyl), take with full glass of water  Miralax (polyethylene glycol) once or twice a day as needed.  If you have tried all these things and are unable to have a bowel movement in the first 3-4 days after surgery call either your surgeon or your primary doctor.    If you experience loose  stools or diarrhea, hold the medications until you stool forms back up.  If your symptoms do not get better within 1 week or if they get worse, check with your doctor.  If you experience "the worst abdominal pain ever" or develop nausea or vomiting, please contact the office immediately for further recommendations for treatment.   ITCHING:  If you experience itching with your medications, try taking only a single pain pill, or even half a pain pill at a time.  You can also use Benadryl over the counter for itching or also to help with sleep.   TED HOSE STOCKINGS:  Use stockings on both legs until for at least 2 weeks or as directed by physician office. They may be removed at night for sleeping.  MEDICATIONS:  See your medication summary on the "After Visit Summary" that nursing will review with you.  You may have some home medications which will be placed on hold until you complete the course of blood thinner medication.  It is important for you to complete the blood thinner medication as prescribed.  PRECAUTIONS:  If you experience chest pain or shortness of breath - call 911 immediately for transfer to the hospital emergency department.   If you develop a fever greater that 101 F, purulent drainage from wound, increased redness or drainage from wound, foul odor from the wound/dressing, or calf pain - CONTACT YOUR SURGEON.                                                   FOLLOW-UP APPOINTMENTS:  If you do not already have a post-op appointment, please call the office for an appointment to be seen by your surgeon.  Guidelines for how soon to be seen are listed in your "After Visit Summary", but are typically between 1-4 weeks after surgery.  OTHER INSTRUCTIONS:   Knee Replacement:  Do not place pillow under knee, focus on keeping the knee straight while resting. CPM instructions: 0-90 degrees, 2 hours in the morning, 2 hours in the afternoon, and 2 hours in the evening. Place foam block, curve side  up under heel at all times except when in CPM or when walking.  DO NOT modify, tear, cut, or change the foam block in any way.  POST-OPERATIVE OPIOID TAPER INSTRUCTIONS: It is important to wean off of your opioid medication as soon as possible. If you do not need pain medication after your surgery it is ok to stop day one. Opioids include: Codeine, Hydrocodone(Norco, Vicodin), Oxycodone(Percocet, oxycontin) and hydromorphone amongst others.  Long term and even short term use of opiods can cause: Increased pain response Dependence Constipation Depression Respiratory depression And more.  Withdrawal symptoms can include Flu like symptoms Nausea, vomiting And more Techniques to manage these symptoms Hydrate well Eat regular healthy meals Stay active Use relaxation techniques(deep breathing, meditating, yoga) Do Not substitute Alcohol to help with tapering If you have been on opioids for less than two weeks and do not have pain than it is ok to stop all together.  Plan to wean off of opioids This plan should start within one week post op of your joint replacement. Maintain the same interval or time between taking each dose and first decrease the dose.  Cut the total daily intake of opioids by one tablet each day Next start to increase the time between doses. The last dose that should be eliminated is the evening dose.     MAKE SURE YOU:  Understand these instructions.  Get help right away if you are not doing well or get worse.    Thank you for letting us be a part of your medical care team.  It is a privilege we respect greatly.  We hope these instructions will help you stay on track for a fast and full recovery!   Increase activity slowly as tolerated   Complete by: As directed    Post-operative opioid taper instructions:   Complete by: As directed    POST-OPERATIVE OPIOID TAPER INSTRUCTIONS: It is important to wean off of your opioid medication as soon as possible. If you  do not need pain medication after your surgery it is ok to stop day one. Opioids include: Codeine, Hydrocodone(Norco, Vicodin), Oxycodone(Percocet, oxycontin) and hydromorphone amongst others.  Long term and even short term use of opiods can cause: Increased pain response Dependence Constipation Depression Respiratory depression And more.  Withdrawal symptoms can include Flu like symptoms Nausea, vomiting And more Techniques to manage these symptoms Hydrate well Eat regular healthy meals Stay active Use relaxation techniques(deep breathing, meditating, yoga) Do Not substitute Alcohol to help with tapering If you have been on opioids for less than two weeks and do not have pain than it is ok to stop all together.  Plan to wean off of opioids This plan should start within one week post op of your joint replacement. Maintain the same interval or time between taking each dose and first decrease the dose.  Cut the total daily intake of opioids by one tablet each day Next start to increase the time between doses. The last dose that should be eliminated is the evening dose.           Follow-up Information     Dorna Leitz, MD. Schedule an appointment as soon as possible for a visit in 2 week(s).   Specialty: Orthopedic Surgery Contact information: [place] [address] [phone]                  Signed: Larwance Sachs Morgin Halls 05/11/2021, 10:31 AM

## 2021-05-11 NOTE — Plan of Care (Signed)
  Problem: Pain Management: Goal: Pain level will decrease with appropriate interventions Outcome: Progressing   Problem: Activity: Goal: Ability to avoid complications of mobility impairment will improve Outcome: Progressing Goal: Range of joint motion will improve Outcome: Progressing

## 2021-05-11 NOTE — TOC Transition Note (Signed)
Transition of Care [facility]) - CM/SW Discharge Note  Patient Details  Name: Melissa Rodgers MRN: [id number] Date of Birth: [birth year]  [facility] CM/SW Contact:  Sherie Don, LCSW Phone Number: 05/11/2021, 10:15 AM  Clinical Narrative: Patient is expected to discharge after working with PT. CSW met with patient to confirm discharge plan and needs. Patient is prearranged with [facility] for [place]. Patient has a CPM, cane, rolling walker, and high toilets with handles so there are no DME needs at this time. TOC signing off.  Final next level of care: [place] Barriers to Discharge: No Barriers Identified  Patient Goals and CMS Choice Patient states their goals for this hospitalization and ongoing recovery are:: Discharge home with HHPT through [place] CMS Medicare.gov Compare Post Acute Care list provided to:: Patient Choice offered to / list presented to : Patient  Discharge Plan and Services         DME Arranged: N/A DME Agency: NA HH Arranged: PT HH Agency: [facility] Representative spoke with at [place] in orthopedist's office  Readmission Risk Interventions No flowsheet data found.

## 2021-05-11 NOTE — Progress Notes (Signed)
Discharge package printed and instructions given to pt and spouse.  Verbalizes understanding.

## 2021-05-11 NOTE — Progress Notes (Signed)
Subjective: 1 Day Post-Op Procedure(s) (LRB): TOTAL KNEE REVISION (Left)  Patient feeling better this morning and is hoping to go home today.  Activity level:  wbat Diet tolerance:  ok Voiding:  ok Patient reports pain as mild.    Objective: Vital signs in last 24 hours: Temp:  [97.5 F (36.4 C)-98.7 F (37.1 C)] 97.6 F (36.4 C) (06/18 0554) Pulse Rate:  [57-81] 60 (06/18 0554) Resp:  [8-20] 18 (06/18 0554) BP: (91-145)/(55-87) 105/65 (06/18 0554) SpO2:  [92 %-100 %] 96 % (06/18 0554) Weight:  [83 kg] 83 kg (06/17 1800)  Labs: Recent Labs    05/11/21 0342  HGB 11.7*   Recent Labs    05/11/21 0342  WBC 15.1*  RBC 4.04  HCT 37.1  PLT 239   No results for input(s): NA, K, CL, CO2, BUN, CREATININE, GLUCOSE, CALCIUM in the last 72 hours. No results for input(s): LABPT, INR in the last 72 hours.  Physical Exam:  Neurologically intact ABD soft Neurovascular intact Sensation intact distally Intact pulses distally Dorsiflexion/Plantar flexion intact Incision: dressing C/D/I and no drainage No cellulitis present Compartment soft  Assessment/Plan:  1 Day Post-Op Procedure(s) (LRB): TOTAL KNEE REVISION (Left) Advance diet Up with therapy D/C IV fluids Discharge home with home health today after PT if cleared and doing well. Follow up in office 2 weeks post op. Continue home eliquis    Melissa Rodgers 05/11/2021, 8:52 AM

## 2021-05-11 NOTE — Progress Notes (Signed)
Physical Therapy Treatment Patient Details Name: Melissa Rodgers MRN: [id number] DOB: [birth year] Today's Date: 05/11/2021    History of Present Illness Patient is 70 y.o. 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    PT Comments    Progressing well. Pt denied dizziness on today. O2 96% on RA during session. Reviewed/practiced exercises, gait training, and stair training. Issued exercises handout for pt to perform if there is a delay in HHPT. No further questions/concerns from pt or husband. Okay to d/c from PT standpoint.     Follow Up Recommendations  Follow surgeon's recommendation for DC plan and follow-up therapies     Equipment Recommendations  None recommended by PT    Recommendations for Other Services       Precautions / Restrictions Precautions Precautions: Fall;Knee Restrictions Weight Bearing Restrictions: No LLE Weight Bearing: Weight bearing as tolerated    Mobility  Bed Mobility               General bed mobility comments: oob in recliner    Transfers Overall transfer level: Needs assistance Equipment used: Rolling walker (2 wheeled) Transfers: Sit to/from Stand Sit to Stand: Min guard         General transfer comment: Min guard for safety. Cues for safety, technique, hand/LE placement.  Ambulation/Gait Ambulation/Gait assistance: Min guard Gait Distance (Feet): 50 Feet Assistive device: Rolling walker (2 wheeled) Gait Pattern/deviations: Step-to pattern     General Gait Details: Min guard for safety. Cues for safety, sequence. Slow but steady gait. Pt denied lightheadedness.   Stairs Stairs: Yes Stairs assistance: Min guard Stair Management: Step to pattern;Forwards;Two rails Number of Stairs: 2 General stair comments: Up and over portable stairs x 1. Husband present to observe/assist with managing RW. Cues for safety, technique, sequence.   Wheelchair  Mobility    Modified Rankin (Stroke Patients Only)       Balance                                            Cognition Arousal/Alertness: Awake/alert Behavior During Therapy: WFL for tasks assessed/performed Overall Cognitive Status: Within Functional Limits for tasks assessed                                        Exercises Total Joint Exercises Ankle Circles/Pumps: AROM;Both;5 reps Quad Sets: AROM;Both;5 reps Hip ABduction/ADduction: AAROM;AROM;Left;5 reps Straight Leg Raises: AAROM;Left;5 reps;AROM Knee Flexion: AAROM;Left;5 reps;Seated Goniometric ROM: ~5-60 degrees(limited by pain)    General Comments        Pertinent Vitals/Pain Pain Assessment: 0-10 Pain Score: 6  Pain Location: Lt knee Pain Descriptors / Indicators: Discomfort;Sore;Aching Pain Intervention(s): Monitored during session;Limited activity within patient's tolerance;Premedicated before session;Ice applied    Home Living                      Prior Function            PT Goals (current goals can now be found in the care plan section) Progress towards PT goals: Progressing toward goals    Frequency    7X/week      PT Plan Current plan remains appropriate    Co-evaluation  AM-PAC PT "6 Clicks" Mobility   Outcome Measure  Help needed turning from your back to your side while in a flat bed without using bedrails?: A Little Help needed moving from lying on your back to sitting on the side of a flat bed without using bedrails?: A Little Help needed moving to and from a bed to a chair (including a wheelchair)?: A Little Help needed standing up from a chair using your arms (e.g., wheelchair or bedside chair)?: A Little Help needed to walk in hospital room?: A Little Help needed climbing 3-5 steps with a railing? : A Little 6 Click Score: 18    End of Session Equipment Utilized During Treatment: Gait belt Activity Tolerance:  Patient tolerated treatment well Patient left: in chair;with call bell/phone within reach;with family/visitor present   PT Visit Diagnosis: Other abnormalities of gait and mobility (R26.89);Pain Pain - Right/Left: Left Pain - part of body: Knee     Time: 5597-4163 PT Time Calculation (min) (ACUTE ONLY): 25 min  Charges:  $Gait Training: 8-22 mins $Therapeutic Exercise: 8-22 mins                        Doreatha Massed, PT Acute Rehabilitation  Office: [phone] Pager: [phone]

## 2021-05-12 DIAGNOSIS — R32 Unspecified urinary incontinence: Secondary | ICD-10-CM | POA: Diagnosis not present

## 2021-05-12 DIAGNOSIS — T84033D 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, subsequent encounter: Secondary | ICD-10-CM | POA: Diagnosis not present

## 2021-05-12 DIAGNOSIS — F411 Generalized anxiety disorder: Secondary | ICD-10-CM | POA: Diagnosis not present

## 2021-05-12 DIAGNOSIS — E785 Hyperlipidemia, unspecified: Secondary | ICD-10-CM | POA: Diagnosis not present

## 2021-05-12 DIAGNOSIS — I4891 Unspecified atrial fibrillation: Secondary | ICD-10-CM | POA: Diagnosis not present

## 2021-05-12 DIAGNOSIS — M545 Low back pain, unspecified: Secondary | ICD-10-CM | POA: Diagnosis not present

## 2021-05-12 DIAGNOSIS — G8929 Other chronic pain: Secondary | ICD-10-CM | POA: Diagnosis not present

## 2021-05-12 DIAGNOSIS — K219 Gastro-esophageal reflux disease without esophagitis: Secondary | ICD-10-CM | POA: Diagnosis not present

## 2021-05-12 DIAGNOSIS — M797 Fibromyalgia: Secondary | ICD-10-CM | POA: Diagnosis not present

## 2021-05-12 DIAGNOSIS — Z7901 Long term (current) use of anticoagulants: Secondary | ICD-10-CM | POA: Diagnosis not present

## 2021-05-12 DIAGNOSIS — I10 Essential (primary) hypertension: Secondary | ICD-10-CM | POA: Diagnosis not present

## 2021-05-12 DIAGNOSIS — F410 Panic disorder [episodic paroxysmal anxiety] without agoraphobia: Secondary | ICD-10-CM | POA: Diagnosis not present

## 2021-05-12 DIAGNOSIS — F329 Major depressive disorder, single episode, unspecified: Secondary | ICD-10-CM | POA: Diagnosis not present

## 2021-05-12 DIAGNOSIS — Z9181 History of falling: Secondary | ICD-10-CM | POA: Diagnosis not present

## 2021-05-12 DIAGNOSIS — D649 Anemia, unspecified: Secondary | ICD-10-CM | POA: Diagnosis not present

## 2021-05-13 ENCOUNTER — Encounter (HOSPITAL_COMMUNITY): Payer: Self-pay | Admitting: Orthopedic Surgery

## 2021-05-13 ENCOUNTER — Other Ambulatory Visit (HOSPITAL_COMMUNITY): Payer: Self-pay

## 2021-05-14 DIAGNOSIS — I10 Essential (primary) hypertension: Secondary | ICD-10-CM | POA: Diagnosis not present

## 2021-05-14 DIAGNOSIS — T84033D 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, subsequent encounter: Secondary | ICD-10-CM | POA: Diagnosis not present

## 2021-05-14 DIAGNOSIS — D649 Anemia, unspecified: Secondary | ICD-10-CM | POA: Diagnosis not present

## 2021-05-14 DIAGNOSIS — F329 Major depressive disorder, single episode, unspecified: Secondary | ICD-10-CM | POA: Diagnosis not present

## 2021-05-14 DIAGNOSIS — K219 Gastro-esophageal reflux disease without esophagitis: Secondary | ICD-10-CM | POA: Diagnosis not present

## 2021-05-14 DIAGNOSIS — I4891 Unspecified atrial fibrillation: Secondary | ICD-10-CM | POA: Diagnosis not present

## 2021-05-14 NOTE — Op Note (Signed)
PATIENT ID:      Melissa Rodgers  MRN:     [id number] DOB/AGE:    December 09, 1950 / 70 y.o.       OPERATIVE REPORT   DATE OF PROCEDURE:  05/14/2021      PREOPERATIVE DIAGNOSIS:   LEFT KNEE PAIN AFTER UNICOMPARTMENTAL KNEE REPLACEMENT      Estimated body mass index is 32.42 kg/m as calculated from the following:   Height as of this encounter: 5\' 3"  (1.6 m).   Weight as of this encounter: 83 kg.                                                       POSTOPERATIVE DIAGNOSIS:   Same                                                                  PROCEDURE:  Procedure(s): TOTAL KNEE REVISION Using DepuyAttune RP implants #4 Femur, #4Tibia, 10 mm Attune RP bearing, 38 Patella    SURGEON: [name]  ASSISTANT:   Gary Fleet PA-C   (Present and scrubbed throughout the case, critical for assistance with exposure, retraction, instrumentation, and closure.)        ANESTHESIA: spinal, 20cc Exparel, 50cc 0.25% Marcaine EBL: min cc FLUID REPLACEMENT: unk cc crystaloid TOURNIQUET: DRAINS: None TRANEXAMIC ACID: 1gm IV, 2gm topical COMPLICATIONS:  None         INDICATIONS FOR PROCEDURE: The patient has  LEFT KNEE PAIN AFTER UNICOMPARTMENTAL KNEE REPLACEMENT, mild varus deformities, XR shows Unicompartmental knee replacement in place.  Bone scan shows loose tibial component.Patient has failed all conservative measures including anti-inflammatory medicines, narcotics, attempts at exercise and weight loss, cortisone injections and viscosupplementation.  Risks and benefits of surgery have been discussed, questions answered.   DESCRIPTION OF PROCEDURE: The patient identified by armband, received  IV antibiotics, in the holding area at [facility]. Patient taken to the operating room, appropriate anesthetic monitors were attached, and spinal anesthesia was  induced. IV Tranexamic acid was given.Tourniquet applied high to the operative thigh. Lateral post and foot positioner applied to the table, the  lower extremity was then prepped and draped in usual sterile fashion from the toes to the tourniquet. Time-out procedure was performed. Gary Fleet [facility], was present and scrubbed throughout the case, critical for assistance with, positioning, exposure, retraction, instrumentation, and closure.The skin and subcutaneous tissue along the incision was injected with 20 cc of a mixture of Exparel and Marcaine solution, using a 20-gauge by 1-1/2 inch needle. We began the operation, with the knee flexed 130 degrees, by making the anterior midline incision starting at handbreadth above the patella going over the patella 1 cm medial to and 4 cm distal to the tibial tubercle. Small bleeders in the skin and the subcutaneous tissue identified and cauterized. Transverse retinaculum was incised and reflected medially and a medial parapatellar arthrotomy was accomplished. the patella was everted and theprepatellar fat pad resected. The superficial medial collateral ligament was then elevated from anterior to posterior along the proximal flare of the tibia and anterior half of the menisci resected. The knee  was hyperflexed exposing The old unicompartmental knee replacement. Peripheral and notch osteophytes as well as the cruciate ligaments were then resected. We continued to work our way around posteriorly along the proximal tibia, and externally rotated the tibia subluxing it out from underneath the femur. A McHale PCL retractor was placed through the notch and a lateral Hohmann retractor placed, and we then Use the extra medullary alignment guide to make an incision just below the tibial component.  The tibial component was removed en bloc with minimal resistance and was obviously loose.The tibia was cut using this guide with 3 of posterior slope. The tibial cutting guide, 3 degree posterior sloped, was pinned into place allowing resection of 0 mm of bone medially and 10 mm of bone laterally. Satisfied with the tibial resection,  we then entered the distal femur 2 mm anterior to the PCL origin with the intramedullary guide rod and applied the distal femoral cutting guide set at 9 mm, with 5 degrees of valgus. This was pinned along the epicondylar axis. At this point, the distal femoral cut was Started so that we can get a look at where the distal femoral cut would be.  We then used an ACL saw to remove the femoral component of the unicompartmental knee replacement.  Once this was removed we completed the distal femoral cut.We then sized for a #4 femoral component and pinned the guide Parallel to the epicondylar axis and perpendicular to [employer]. The chamfer cutting guide was pinned into place. The anterior, posterior, and chamfer cuts were accomplished without difficulty followed by the Attune RP box cutting guide and the box cut. We also removed posterior osteophytes from the posterior femoral condyles. The posterior capsule was injected with Exparel solution. The knee was brought into full extension. We checked our extension gap and fit a 10 mm bearing. Distracting in extension with a lamina spreader,  bleeders in the posterior capsule, Posterior medial and posterior lateral gutter were cauterized.  The transexamic acid-soaked sponge was then placed in the gap of the knee in extension. The knee was flexed 30. The posterior patella cut was accomplished with the 9.5 mm Attune cutting guide, sized for a 68mm dome, and the fixation pegs drilled.The knee was then once again hyperflexed exposing the proximal tibia. We sized for a # 4 tibial base plate, applied the smokestack and the conical reamer followed by the the Delta fin keel punch. We then hammered into place the Attune RP trial femoral component, drilled the lugs, inserted a  10 mm trial bearing, trial patellar button, and took the knee through range of motion from 0-130 degrees. Medial and lateral ligamentous stability was checked. No thumb pressure was required for patellar  Tracking. The tourniquet was Released at approximately 70 minutes of tourniquet time.. All trial components were removed, mating surfaces irrigated with pulse lavage, and dried with suction and sponges. 10 cc of the Exparel solution was applied to the cancellus bone of the patella distal femur and proximal tibia.  After waiting 30 seconds, the bony surfaces were again, dried with sponges. A double batch of DePuy HV cement was mixed and applied to all bony metallic mating surfaces except for the posterior condyles of the femur itself. In order, we hammered into place the tibial tray and removed excess cement, the femoral component and removed excess cement. The final Attune RP bearing was inserted, and the knee brought to full extension with compression. The patellar button was clamped into place, and excess cement removed.  The knee was held at 30 flexion with compression, while the cement cured. The wound was irrigated out with normal saline solution pulse lavage. The rest of the Exparel was injected into the parapatellar arthrotomy, subcutaneous tissues, and periosteal tissues. The parapatellar arthrotomy was closed with running #1 Vicryl suture. The subcutaneous tissue with 3-0 undyed Vicryl suture, and the skin with running 3-0 SQ vicryl. An Aquacil and Ace wrap were applied. The patient was taken to recovery room without difficulty.   [name] 05/14/2021, 12:28 PM

## 2021-05-15 LAB — AEROBIC/ANAEROBIC CULTURE W GRAM STAIN (SURGICAL/DEEP WOUND): Culture: NO GROWTH

## 2021-05-16 DIAGNOSIS — K219 Gastro-esophageal reflux disease without esophagitis: Secondary | ICD-10-CM | POA: Diagnosis not present

## 2021-05-16 DIAGNOSIS — I10 Essential (primary) hypertension: Secondary | ICD-10-CM | POA: Diagnosis not present

## 2021-05-16 DIAGNOSIS — D649 Anemia, unspecified: Secondary | ICD-10-CM | POA: Diagnosis not present

## 2021-05-16 DIAGNOSIS — T84033D 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, subsequent encounter: Secondary | ICD-10-CM | POA: Diagnosis not present

## 2021-05-16 DIAGNOSIS — I4891 Unspecified atrial fibrillation: Secondary | ICD-10-CM | POA: Diagnosis not present

## 2021-05-16 DIAGNOSIS — F329 Major depressive disorder, single episode, unspecified: Secondary | ICD-10-CM | POA: Diagnosis not present

## 2021-05-20 DIAGNOSIS — I10 Essential (primary) hypertension: Secondary | ICD-10-CM | POA: Diagnosis not present

## 2021-05-20 DIAGNOSIS — T84033D 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, subsequent encounter: Secondary | ICD-10-CM | POA: Diagnosis not present

## 2021-05-20 DIAGNOSIS — K219 Gastro-esophageal reflux disease without esophagitis: Secondary | ICD-10-CM | POA: Diagnosis not present

## 2021-05-20 DIAGNOSIS — I4891 Unspecified atrial fibrillation: Secondary | ICD-10-CM | POA: Diagnosis not present

## 2021-05-20 DIAGNOSIS — F329 Major depressive disorder, single episode, unspecified: Secondary | ICD-10-CM | POA: Diagnosis not present

## 2021-05-20 DIAGNOSIS — D649 Anemia, unspecified: Secondary | ICD-10-CM | POA: Diagnosis not present

## 2021-05-23 DIAGNOSIS — Z96652 Presence of left artificial knee joint: Secondary | ICD-10-CM | POA: Diagnosis not present

## 2021-05-23 DIAGNOSIS — Z471 Aftercare following joint replacement surgery: Secondary | ICD-10-CM | POA: Diagnosis not present

## 2021-05-24 DIAGNOSIS — T84033D 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, subsequent encounter: Secondary | ICD-10-CM | POA: Diagnosis not present

## 2021-05-24 DIAGNOSIS — F329 Major depressive disorder, single episode, unspecified: Secondary | ICD-10-CM | POA: Diagnosis not present

## 2021-05-24 DIAGNOSIS — I10 Essential (primary) hypertension: Secondary | ICD-10-CM | POA: Diagnosis not present

## 2021-05-24 DIAGNOSIS — K219 Gastro-esophageal reflux disease without esophagitis: Secondary | ICD-10-CM | POA: Diagnosis not present

## 2021-05-24 DIAGNOSIS — D649 Anemia, unspecified: Secondary | ICD-10-CM | POA: Diagnosis not present

## 2021-05-24 DIAGNOSIS — I4891 Unspecified atrial fibrillation: Secondary | ICD-10-CM | POA: Diagnosis not present

## 2021-05-29 ENCOUNTER — Other Ambulatory Visit: Payer: Self-pay

## 2021-05-29 ENCOUNTER — Ambulatory Visit (INDEPENDENT_AMBULATORY_CARE_PROVIDER_SITE_OTHER): Payer: Medicare Other | Admitting: Rehabilitative and Restorative Service Providers"

## 2021-05-29 ENCOUNTER — Encounter: Payer: Self-pay | Admitting: Rehabilitative and Restorative Service Providers"

## 2021-05-29 DIAGNOSIS — R29898 Other symptoms and signs involving the musculoskeletal system: Secondary | ICD-10-CM | POA: Diagnosis not present

## 2021-05-29 DIAGNOSIS — M25562 Pain in left knee: Secondary | ICD-10-CM

## 2021-05-29 DIAGNOSIS — M25662 Stiffness of left knee, not elsewhere classified: Secondary | ICD-10-CM

## 2021-05-29 DIAGNOSIS — R531 Weakness: Secondary | ICD-10-CM

## 2021-05-29 DIAGNOSIS — M25551 Pain in right hip: Secondary | ICD-10-CM | POA: Diagnosis not present

## 2021-05-29 DIAGNOSIS — G8929 Other chronic pain: Secondary | ICD-10-CM | POA: Diagnosis not present

## 2021-05-29 DIAGNOSIS — R269 Unspecified abnormalities of gait and mobility: Secondary | ICD-10-CM | POA: Diagnosis not present

## 2021-05-29 NOTE — Therapy (Signed)
[place], [place], [address] Phone: [phone]   Fax:  [phone]  Physical Therapy Evaluation  Patient Details  Name: Melissa Rodgers MRN: [id number] Date of Birth: 05-Dec-1950 Referring Provider (PT): Dr Dorna Leitz   Encounter Date: 05/29/2021   PT End of Session - 05/29/21 1520     Visit Number 1    Number of Visits 24    Date for PT Re-Evaluation 08/21/21    PT Start Time 1430    PT Stop Time 1515    PT Time Calculation (min) 45 min    Activity Tolerance Patient tolerated treatment well             Past Medical History:  Diagnosis Date   Anemia    Atrial fibrillation 06/18/2019   BMI 30.0-30.9,adult 01/27/2013   Candidosis of skin 04/17/2016   Formatting of this note might be different from the original. 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 01/29/15:  24 Hour Holter Monitor CONCLUSIONS: 1. Overall normal 24-hour Holter monitor recording, with rare,  brief,  asymptomatic nonsustained supraventricular tachycardia as described.  2. No cardiac rhythm explanation for patient's symptoms.       Primary osteoarthritis of both knees 10/07/2016   Formatting of this note might be different from the original. 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   Primary osteoarthritis of right knee 11/21/2014   S/P laparoscopic fundoplication 8/92/1194   Scar condition and fibrosis of skin 04/17/2016   Formatting of this note might be different from the original. lower abdomen   Sebaceous hyperplasia 04/17/2016   Seborrheic keratoses 04/17/2016   Skin tags, multiple acquired 04/17/2016   Solar lentigo 04/17/2016   Subarachnoid hemorrhage following injury 2010   Fall from horse; brief hospitalization    Past Surgical History:  Procedure Laterality Date   ESOPHAGOGASTRODUODENOSCOPY     possibly last one was 2017. In [place]   HERNIA REPAIR     MEDIAL PARTIAL KNEE REPLACEMENT Right 17/40/8144   NISSEN FUNDOPLICATION  81/85/6314   OOPHORECTOMY     PARTIAL KNEE ARTHROPLASTY Left 04/05/2014   TOTAL KNEE REVISION Left 05/10/2021   Procedure: TOTAL KNEE REVISION;  Surgeon: Dorna Leitz, MD;  Location: [facility];  Service: Orthopedics;  Laterality: Left;    There were no vitals filed for this visit.    Subjective Assessment - 05/29/21 1441     Subjective Patient reports that she had Lt partial knee replacement 2015. She underwent TKA 9/70/26 with no complications.  Pertinent History Rt partial knee replacement 2016; a-fib; HTN; mild anxiety/depression; arthritis; fundoplication 8315; shoulder pain; neck pain    Patient Stated Goals improve flexibility and recovery from knee surgery    Currently in Pain? Yes    Pain Score 3     Pain Location Knee    Pain Orientation Left    Pain Descriptors / Indicators Tightness;Sore   swollen   Pain Type Surgical pain;Chronic pain    Pain Radiating Towards anterior shin    Pain Onset More than  a month ago    Pain Frequency Intermittent    Aggravating Factors  movement - worst is twisting movements    Pain Relieving Factors ice; meds; rest                [facility] PT Assessment - 05/29/21 0001       Assessment   Medical Diagnosis Lt TKA   revision of partial knee replacement 2015   Referring Provider (PT) Dr Dorna Leitz    Onset Date/Surgical Date 05/10/21    Hand Dominance Left    Next MD Visit 06/13/21    Prior Therapy here for knee and shoulder      Precautions   Precautions None      Restrictions   Weight Bearing Restrictions No      Balance Screen   Has the patient fallen in the past 6 months No    Has the patient had a decrease in activity level because of a fear of falling?  No    Is the patient reluctant to leave their home because of a fear of falling?  No      Home Ecologist residence    Living Arrangements Spouse/significant other    Home Access Stairs to enter    Entrance Stairs-Number of Steps 7    Entrance Stairs-Rails Can reach both    [place] One level    [place] - single point;Walker - 2 wheels;Shower seat;Grab bars - toilet;Grab bars - tub/shower;Hand held shower head      Prior Function   Level of Independence Independent with basic ADLs    Vocation Retired    [employer] worked for [employer] center - at [employer] retired 2019    Leisure household chores; gardening; sewing; [place]; painting; walking 1-2 x/wk 20 min      Observation/Other Assessments   Skin Integrity skin clean and dry - no drainage; discoloration noted around knee and into the Lt shin    Focus on Therapeutic Outcomes (FOTO)  40      Observation/Other Assessments-Edema    Edema --   moderate edema Lt knee     Sensation   Additional Comments WFL's per pt report      Posture/Postural Control   Posture Comments forward posture; wt shifted to the Rt in standing      AROM   Right Knee Extension 3    Right Knee  Flexion 133    Left Knee Extension -8    Left Knee Flexion 92      Strength   Right Hip Flexion 4+/5    Right Hip Extension 3/5    Right Hip ABduction 3+/5    Left Hip Flexion 3+/5    Left Hip Extension 3-/5    Left Hip ABduction 2+/5    Right Knee Flexion 4/5    Right Knee Extension 4/5      Flexibility   Hamstrings Lt 70; Rt 80  Quadriceps Lt 27; Rt 92    ITB tight Lt      Palpation   Palpation comment tender peripatellar area and anterior shin Lt LE      Ambulation/Gait   Ambulation/Gait Yes    Ambulation/Gait Assistance 7: Independent    Ambulation Distance (Feet) 40 Feet    Assistive device Small based quad cane    Gait Pattern Step-to pattern    Ambulation Surface Level    Gait velocity slowed    Gait Comments antalgic gait Lt LE                        Objective measurements completed on examination: See above findings.       [facility] Adult PT Treatment/Exercise - 05/29/21 0001       Therapeutic Activites    Therapeutic Activities Other Therapeutic Activities    Other Therapeutic Activities adjusted cane to proper height; verbally reviewed step through gait pattern and weight shift      Knee/Hip Exercises: Stretches   Passive Hamstring Stretch Left;2 reps;30 seconds   supine with strap   Quad Stretch Left;2 reps;30 seconds   prone with strap     Knee/Hip Exercises: Supine   Quad Sets AROM;Strengthening;Left   HEP instruction   Short Arc Quad Sets AROM;Strengthening;Left   HEP instruction   Heel Slides AAROM;Left;5 reps   with strap   Straight Leg Raises AROM;Strengthening;Left   HEP instruction   Knee Extension AROM;Strengthening;Left   seated HEP instruction     Knee/Hip Exercises: Prone   Other Prone Exercises quad set 5 sec x 5 reps      Modalities   Modalities --   declined ice/vaso - to ice at home     Manual Therapy   Manual therapy comments pt seated    Edema Management instructed in retrograde massage Lt anterior leg; ice and  elevation Lt LE    Soft tissue mobilization instructed in desentization and massage anterior Lt shin area of bruising and pain                    PT Education - 05/29/21 1504     Education Details POC, HEP    Person(s) Educated Patient    Methods Explanation;Handout;Verbal cues    Comprehension Returned demonstration;Verbalized understanding              PT Short Term Goals - 05/29/21 1712       PT SHORT TERM GOAL #1   Title Independent in inital HEP    Time 6    Period Weeks    Status New    Target Date 07/10/21      PT SHORT TERM GOAL #2   Title normal gait pattern with cane to no assistive device    Time 6    Period Weeks    Status New    Target Date 07/10/21      PT SHORT TERM GOAL #3   Title AROM Lt knee -3 deg extension to 100 deg flexion    Time 6    Period Weeks    Status New    Target Date 07/10/21               PT Long Term Goals - 05/29/21 1714       PT LONG TERM GOAL #1   Title The patient will be indep with HEP including community aquatic pool program as appropriate    Time 12  Period Weeks    Status New    Target Date 08/21/21      PT LONG TERM GOAL #2   Title 5/5 strength Lt LE    Baseline -    Time 12    Period Weeks    Status New    Target Date 08/21/21      PT LONG TERM GOAL #3   Title Patient reports return to normal functional activities including walking for 20 min without increased symptoms    Baseline -    Time 12    Period Weeks    Status New    Target Date 08/21/21      PT LONG TERM GOAL #4   Title AROM Lt knee 0 deg extension to 110 deg flexion    Baseline -    Time 12    Period Weeks    Status New    Target Date 08/21/21      PT LONG TERM GOAL #5   Title Improve functional limitation score to 53    Time 12    Period Weeks    Status New    Target Date 08/21/21                    Plan - 05/29/21 1517     Clinical Impression Statement Patient presents s/p Lt TKA 05/10/21  (revision). She has abnormal gait pattern; limited ROM/strength/function/ADL's following surgery. Patient will benefit from PT to address problems identified.    Stability/Clinical Decision Making Stable/Uncomplicated    Clinical Decision Making Low    Rehab Potential Good    PT Frequency 2x / week    PT Duration 6 weeks    PT Treatment/Interventions ADLs/Self Care Home Management;Aquatic Therapy;Cryotherapy;Electrical Stimulation;Iontophoresis 4mg /ml Dexamethasone;Moist Heat;Ultrasound;Gait training;Stair training;Functional mobility training;Therapeutic activities;Therapeutic exercise;Balance training;Neuromuscular re-education;Patient/family education;Manual techniques;Passive range of motion;Dry needling;Taping;Vasopneumatic Device    PT Next Visit Plan review HEP; progress with ROM and strength Lt LE; gait training; modalities as indicated    PT Home Exercise Plan [place] and Agree with Plan of Care Patient             Patient will benefit from skilled therapeutic intervention in order to improve the following deficits and impairments:  Abnormal gait, Decreased range of motion, Difficulty walking, Pain, Decreased activity tolerance, Decreased balance, Impaired flexibility, Decreased mobility, Decreased strength, Increased edema, Postural dysfunction  Visit Diagnosis: Stiffness of left knee  Weakness generalized  Other symptoms and signs involving the musculoskeletal system  Chronic pain of left knee  Abnormal gait  Pain in right hip     Problem List Patient Active Problem List   Diagnosis Date Noted   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[place]) 05/10/2021   DDD (degenerative disc disease), cervical 12/03/2020   Severe pain of left shoulder 11/05/2020   Fibromyalgia 03/05/2020   Knee pain, acute 03/05/2020   Osteoarthritis 03/05/2020   Diverticulitis of sigmoid colon  08/01/2019   Generalized anxiety disorder with panic attacks 08/01/2019   Unspecified atrial fibrillation 06/18/2019   Major depressive disorder 09/09/2018   Dry eye syndrome of both eyes 09/09/2018   Microscopic hematuria 09/09/2018   Non-seasonal allergic rhinitis 09/09/2018   Hyperlipidemia 09/09/2018   Essential hypertension 09/09/2018   History of palpitations 09/09/2018   Chronic bilateral low back pain without sciatica 10/07/2016   Chronic bilateral upper abdominal pain 10/07/2016   Primary osteoarthritis of both knees  post bilateral hemiarthroplasty 10/07/2016   Candidosis of skin 04/17/2016   Cherry angioma 04/17/2016   Dry skin 04/17/2016   Dermal nevus of left forearm 04/17/2016   Multiple benign nevi 04/17/2016   Scar condition and fibrosis of skin 04/17/2016   Sebaceous hyperplasia 04/17/2016   Seborrheic keratoses 04/17/2016   Skin tags, multiple acquired 04/17/2016   Solar lentigo 04/17/2016   S/P laparoscopic fundoplication 37/62/8315   Gastroesophageal reflux disease without esophagitis 11/06/2015   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   Primary osteoarthritis of right knee 11/21/2014   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

## 2021-05-29 NOTE — Patient Instructions (Signed)
Access Code: [place] URL: [email or web address] Date: 05/29/2021 Prepared by: [place]  Exercises Supine Quad Set - 3 x daily - 7 x weekly - 10 reps - 1 sets - 10 hold Supine Heel Slide with Strap - 3 x daily - 7 x weekly - 10 reps - 1 sets - 10 hold Supine Short Arc Quad - 3 x daily - 7 x weekly - 10 reps - 1 sets Supine Active Straight Leg Raise - 3 x daily - 7 x weekly - 10 reps - 1 sets Seated Knee Flexion Extension AROM - 3 x daily - 7 x weekly - 10 reps - 1 sets Seated Long Arc Quad - 3 x daily - 7 x weekly - 10 reps - 1 sets

## 2021-06-03 ENCOUNTER — Encounter: Payer: Self-pay | Admitting: Rehabilitative and Restorative Service Providers"

## 2021-06-03 ENCOUNTER — Other Ambulatory Visit: Payer: Self-pay

## 2021-06-03 ENCOUNTER — Ambulatory Visit (INDEPENDENT_AMBULATORY_CARE_PROVIDER_SITE_OTHER): Payer: Medicare Other | Admitting: Rehabilitative and Restorative Service Providers"

## 2021-06-03 DIAGNOSIS — R269 Unspecified abnormalities of gait and mobility: Secondary | ICD-10-CM

## 2021-06-03 DIAGNOSIS — G8929 Other chronic pain: Secondary | ICD-10-CM

## 2021-06-03 DIAGNOSIS — R29898 Other symptoms and signs involving the musculoskeletal system: Secondary | ICD-10-CM

## 2021-06-03 DIAGNOSIS — M25562 Pain in left knee: Secondary | ICD-10-CM

## 2021-06-03 DIAGNOSIS — M25662 Stiffness of left knee, not elsewhere classified: Secondary | ICD-10-CM | POA: Diagnosis not present

## 2021-06-03 DIAGNOSIS — R531 Weakness: Secondary | ICD-10-CM

## 2021-06-03 NOTE — Therapy (Signed)
[place], [place], [address] Phone: [phone]   Fax:  [phone]  Physical Therapy Treatment  Patient Details  Name: Melissa Rodgers MRN: [id number] Date of Birth: Apr 25, 1951 Referring Provider (PT): Dr Dorna Leitz   Encounter Date: 06/03/2021   PT End of Session - 06/03/21 1434     Visit Number 2    Number of Visits 24    Date for PT Re-Evaluation 08/21/21    PT Start Time 1430    PT Stop Time 1520    PT Time Calculation (min) 50 min    Activity Tolerance Patient tolerated treatment well             Past Medical History:  Diagnosis Date   Anemia    Atrial fibrillation 06/18/2019   BMI 30.0-30.9,adult 01/27/2013   Candidosis of skin 04/17/2016   Formatting of this note might be different from the original. 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 01/29/15:  24 Hour Holter Monitor CONCLUSIONS: 1. Overall normal 24-hour Holter monitor recording, with rare,  brief,  asymptomatic nonsustained supraventricular tachycardia as described.  2. No cardiac rhythm explanation for patient's symptoms.       Primary osteoarthritis of both knees 10/07/2016   Formatting of this note might be different from the original. 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   Primary osteoarthritis of right knee 11/21/2014   S/P laparoscopic fundoplication 9/50/9326   Scar condition and fibrosis of skin 04/17/2016   Formatting of this note might be different from the original. lower abdomen   Sebaceous hyperplasia 04/17/2016   Seborrheic keratoses 04/17/2016   Skin tags, multiple acquired 04/17/2016   Solar lentigo 04/17/2016   Subarachnoid hemorrhage following injury 2010   Fall from horse; brief hospitalization    Past Surgical History:  Procedure Laterality Date   ESOPHAGOGASTRODUODENOSCOPY     possibly last one was 2017. In [place]   HERNIA REPAIR     MEDIAL PARTIAL KNEE REPLACEMENT Right 71/24/5809   NISSEN FUNDOPLICATION  98/33/8250   OOPHORECTOMY     PARTIAL KNEE ARTHROPLASTY Left 04/05/2014   TOTAL KNEE REVISION Left 05/10/2021   Procedure: TOTAL KNEE REVISION;  Surgeon: Dorna Leitz, MD;  Location: [facility];  Service: Orthopedics;  Laterality: Left;    There were no vitals filed for this visit.   Subjective Assessment - 06/03/21 1434     Subjective Patient reports that she continues to have some pain, swelling, increased tempature and tightness in the Lt knee. She is working  on her exercises daily ad using ice. he shin remains the most painful.    Pain Location Knee    Pain Orientation Left    Pain Descriptors / Indicators Tightness;Sore                [facility] PT Assessment - 06/03/21 0001       Assessment   Medical Diagnosis Lt TKA   revision of partial knee replacement 2015   Referring Provider (PT) Dr Dorna Leitz    Onset Date/Surgical Date 05/10/21    Hand Dominance Left    Next MD Visit 06/13/21    Prior Therapy  here for knee and shoulder      AROM   Right Knee Extension 3    Right Knee Flexion 133    Left Knee Extension -5    Left Knee Flexion 97                           [facility] Adult PT Treatment/Exercise - 06/03/21 0001       Knee/Hip Exercises: Stretches   Active Hamstring Stretch Left;2 reps;20 seconds    Active Hamstring Stretch Limitations seated hip hinge    Passive Hamstring Stretch Left;2 reps;30 seconds   supine with strap   Knee: Self-Stretch to increase Flexion Left;5 reps;10 seconds   pt assisting with strap   Other Knee/Hip Stretches ankle DF stretch pointing toes down 10 sec hold x 3 reps; ankle circles; ankle pumps      Knee/Hip Exercises: Aerobic   Nustep L4 for ROM only x 6 min UE assist L10      Knee/Hip Exercises: Supine   Quad Sets AROM;Strengthening;Left   HEP instruction   Short Arc Quad Sets AROM;Strengthening;Left   HEP instruction   Heel Slides AAROM;Left;5 reps   with strap   Straight Leg Raises AROM;Strengthening;Left   HEP instruction   Knee Extension AROM;Strengthening;Left   seated HEP instruction     Knee/Hip Exercises: Sidelying   Hip ABduction AROM;Strengthening;Left;10 reps   ~ 3 sec hold     Vasopneumatic   Number Minutes Vasopneumatic  10 minutes    Vasopnuematic Location  Knee    Vasopneumatic Pressure Low    Vasopneumatic Temperature  34      Manual Therapy   Manual therapy comments pt supine    Soft tissue mobilization STM and myofacial relese Lt shin and lateral calf to pt's tolerance    Kinesiotex --   1 strip along lateral shin/calf to improve tissue extensibility                   PT Education - 06/03/21 1516     Education Details kinesotape(has handout); HEP    Person(s) Educated Patient    Methods Explanation;Demonstration;Tactile cues;Verbal cues;Handout    Comprehension Verbalized understanding;Returned demonstration;Verbal cues required;Tactile cues required              PT Short Term Goals -  05/29/21 1712       PT SHORT TERM GOAL #1   Title Independent in inital HEP    Time 6    Period Weeks    Status New    Target Date 07/10/21      PT SHORT TERM GOAL #2   Title normal gait pattern with cane to no assistive device    Time 6    Period Weeks    Status New    Target Date 07/10/21  PT SHORT TERM GOAL #3   Title AROM Lt knee -3 deg extension to 100 deg flexion    Time 6    Period Weeks    Status New    Target Date 07/10/21               PT Long Term Goals - 05/29/21 1714       PT LONG TERM GOAL #1   Title The patient will be indep with HEP including community aquatic pool program as appropriate    Time 12    Period Weeks    Status New    Target Date 08/21/21      PT LONG TERM GOAL #2   Title 5/5 strength Lt LE    Baseline -    Time 12    Period Weeks    Status New    Target Date 08/21/21      PT LONG TERM GOAL #3   Title Patient reports return to normal functional activities including walking for 20 min without increased symptoms    Baseline -    Time 12    Period Weeks    Status New    Target Date 08/21/21      PT LONG TERM GOAL #4   Title AROM Lt knee 0 deg extension to 110 deg flexion    Baseline -    Time 12    Period Weeks    Status New    Target Date 08/21/21      PT LONG TERM GOAL #5   Title Improve functional limitation score to 53    Time 12    Period Weeks    Status New    Target Date 08/21/21                   Plan - 06/03/21 1436     Clinical Impression Statement Patient reports continues that she continues to have pain. She is working on the exercises daily. Pain is primarily along the shin. Trial of STM to Lt shin. Continued with ROM and strengthening.    Rehab Potential Good    PT Frequency 2x / week    PT Duration 6 weeks    PT Treatment/Interventions ADLs/Self Care Home Management;Aquatic Therapy;Cryotherapy;Electrical Stimulation;Iontophoresis 4mg /ml Dexamethasone;Moist Heat;Ultrasound;Gait  training;Stair training;Functional mobility training;Therapeutic activities;Therapeutic exercise;Balance training;Neuromuscular re-education;Patient/family education;Manual techniques;Passive range of motion;Dry needling;Taping;Vasopneumatic Device    PT Next Visit Plan review HEP; progress with ROM and strength Lt LE; gait training; modalities as indicated; assess response to STM Lt shin.    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[place]) 05/10/2021   DDD (degenerative disc disease), cervical 12/03/2020   Severe pain of left shoulder 11/05/2020   Fibromyalgia 03/05/2020   Knee pain, acute 03/05/2020   Osteoarthritis 03/05/2020   Diverticulitis of sigmoid colon 08/01/2019   Generalized anxiety disorder with panic attacks 08/01/2019   Unspecified atrial fibrillation  06/18/2019   Major depressive disorder 09/09/2018   Dry eye syndrome of both eyes 09/09/2018   Microscopic hematuria 09/09/2018   Non-seasonal allergic rhinitis 09/09/2018   Hyperlipidemia 09/09/2018   Essential hypertension 09/09/2018   History of palpitations 09/09/2018   Chronic bilateral low back pain without sciatica 10/07/2016   Chronic bilateral upper abdominal pain 10/07/2016   Primary osteoarthritis of both knees post bilateral hemiarthroplasty 10/07/2016   Candidosis of skin 04/17/2016   Cherry angioma 04/17/2016   Dry skin 04/17/2016   Dermal nevus of left forearm  04/17/2016   Multiple benign nevi 04/17/2016   Scar condition and fibrosis of skin 04/17/2016   Sebaceous hyperplasia 04/17/2016   Seborrheic keratoses 04/17/2016   Skin tags, multiple acquired 04/17/2016   Solar lentigo 04/17/2016   S/P laparoscopic fundoplication 00/34/9179   Gastroesophageal reflux disease without esophagitis 11/06/2015   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   Primary osteoarthritis of right knee 11/21/2014   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

## 2021-06-03 NOTE — Patient Instructions (Signed)
Kinesiology tape What is kinesiology tape?  There are many brands of kinesiology tape.  KTape, [employer], [employer], Dynamic tape, to name a few. It is an elasticized tape designed to support the body's natural healing process. This tape provides stability and support to muscles and joints without restricting motion. It can also help decrease swelling in the area of application. How does it work? The tape microscopically lifts and decompresses the skin to allow for drainage of lymph (swelling) to flow away from area, reducing inflammation.  The tape has the ability to help re-educate the neuromuscular system by targeting specific receptors in the skin.  The presence of the tape increases the body's awareness of posture and body mechanics.  Do not use with: Open wounds Skin lesions Adhesive allergies Safe removal of the tape: In some rare cases, mild/moderate skin irritation can occur.  This can include redness, itchiness, or hives. If this occurs, immediately remove tape and consult your primary care physician if symptoms are severe or do not resolve within 2 days.  To remove tape safely, hold nearby skin with one hand and gentle roll tape down with other hand.  You can apply oil or conditioner to tape while in shower prior to removal to loosen adhesive.  DO NOT swiftly rip tape off like a band-aid, as this could cause skin tears and additional skin irritation.   Access Code: [email or web address]: [email or web address]: 07/11/2022Prepared by: Avianna Moynahan HoltExercises  Supine Quad Set - 3 x daily - 7 x weekly - 10 reps - 1 sets - 10 hold  Supine Heel Slide with Strap - 3 x daily - 7 x weekly - 10 reps - 1 sets - 10 hold  Supine Short Arc Quad - 3 x daily - 7 x weekly - 10 reps - 1 sets  Supine Active Straight Leg Raise - 3 x daily - 7 x weekly - 10 reps - 1 sets  Seated Knee Flexion Extension AROM - 3 x daily - 7 x weekly - 10 reps - 1 sets  Seated Long Arc Quad - 3 x daily - 7 x weekly - 10  reps - 1 sets  Hooklying Hamstring Stretch with Strap - 2 x daily - 7 x weekly - 1 sets - 3 reps - 30 sec hold  Sidelying Hip Abduction - 2 x daily - 7 x weekly - 2-3 sets - 10 reps - 3-5 sec hold

## 2021-06-04 ENCOUNTER — Ambulatory Visit: Payer: Medicare Other | Admitting: Rehabilitative and Restorative Service Providers"

## 2021-06-05 ENCOUNTER — Ambulatory Visit (INDEPENDENT_AMBULATORY_CARE_PROVIDER_SITE_OTHER): Payer: Medicare Other | Admitting: Physical Therapy

## 2021-06-05 ENCOUNTER — Other Ambulatory Visit: Payer: Self-pay

## 2021-06-05 DIAGNOSIS — M25562 Pain in left knee: Secondary | ICD-10-CM

## 2021-06-05 DIAGNOSIS — R29898 Other symptoms and signs involving the musculoskeletal system: Secondary | ICD-10-CM | POA: Diagnosis not present

## 2021-06-05 DIAGNOSIS — M25662 Stiffness of left knee, not elsewhere classified: Secondary | ICD-10-CM

## 2021-06-05 DIAGNOSIS — G8929 Other chronic pain: Secondary | ICD-10-CM | POA: Diagnosis not present

## 2021-06-05 DIAGNOSIS — R531 Weakness: Secondary | ICD-10-CM | POA: Diagnosis not present

## 2021-06-05 NOTE — Therapy (Signed)
[facility] [place], [place], [address] Phone: [phone]   Fax:  [phone]  Physical Therapy Treatment  Patient Details  Name: Melissa Rodgers MRN: [id number] Date of Birth: 1951/05/29 Referring Provider (PT): Dr Dorna Leitz   Encounter Date: 06/05/2021   PT End of Session - 06/05/21 1511     Visit Number 3    Number of Visits 24    Date for PT Re-Evaluation 08/21/21    PT Start Time 0263    PT Stop Time 1512   only 5 min of Vaso - not tolerated.   PT Time Calculation (min) 38 min    Activity Tolerance Patient tolerated treatment well    Behavior During Therapy WFL for tasks assessed/performed             Past Medical History:  Diagnosis Date   Anemia    Atrial fibrillation 06/18/2019   BMI 30.0-30.9,adult 01/27/2013   Candidosis of skin 04/17/2016   Formatting of this note might be different from the original. 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 01/29/15:  24  Hour Holter Monitor CONCLUSIONS: 1. Overall normal 24-hour Holter monitor recording, with rare, brief,  asymptomatic nonsustained supraventricular tachycardia as described.  2. No cardiac rhythm explanation for patient's symptoms.       Primary osteoarthritis of both knees 10/07/2016   Formatting of this note might be different from the original. 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   Primary osteoarthritis of right knee 11/21/2014   S/P laparoscopic fundoplication 7/85/8850   Scar condition and fibrosis of skin 04/17/2016   Formatting of this note might be different from the original. lower abdomen   Sebaceous hyperplasia 04/17/2016   Seborrheic keratoses 04/17/2016   Skin tags, multiple acquired 04/17/2016   Solar lentigo 04/17/2016   Subarachnoid hemorrhage following injury 2010   Fall from horse; brief hospitalization    Past Surgical History:  Procedure Laterality Date   ESOPHAGOGASTRODUODENOSCOPY     possibly last one was 2017. In [place]   HERNIA REPAIR     MEDIAL PARTIAL KNEE REPLACEMENT Right 27/74/1287   NISSEN FUNDOPLICATION  86/76/7209   OOPHORECTOMY     PARTIAL KNEE ARTHROPLASTY Left 04/05/2014   TOTAL KNEE REVISION Left 05/10/2021   Procedure: TOTAL KNEE REVISION;  Surgeon: Dorna Leitz, MD;  Location: [facility];  Service: Orthopedics;  Laterality: Left;    There were no vitals filed for this visit.   Subjective Assessment - 06/05/21 1440     Subjective Pt reports  she didn't do much yesterday because Lt knee was so sore, so she did gentle things to loosen it.    Patient Stated Goals improve flexibility and recovery from knee surgery    Currently in Pain? Yes    Pain Score 4     Pain Location Knee    Pain Orientation Left    Pain Descriptors / Indicators Aching;Tightness;Sore    Aggravating Factors  twisting motions    Pain Relieving Factors ice, meds, rest                [facility] PT Assessment - 06/05/21 0001       Assessment    Medical Diagnosis Lt TKA   revision of partial knee replacement 2015   Referring Provider (PT) Dr Dorna Leitz    Onset Date/Surgical Date 05/10/21    Hand Dominance Left    Next MD Visit 06/13/21    Prior Therapy here for knee and shoulder               [facility] Adult PT Treatment/Exercise - 06/05/21 0001       Knee/Hip Exercises: Stretches   Passive Hamstring Stretch Left;2 reps;30 seconds   seated with hip hinge   Quad Stretch Left;2 reps;20 seconds   prone with strap   Quad Stretch Limitations limited tolerance and range    Gastroc Stretch Left;2 reps;20 seconds   runners stretch     Knee/Hip Exercises: Aerobic   Recumbent Bike partial revolutions for ROM x 5 min      Knee/Hip Exercises: Standing   Heel Raises Both;1 set;5 reps   and toe raises   Heel Raises Limitations some discomfort in Lt shin.    Forward Step Up Left;1 set;10 reps;Hand Hold: 2;Step Height: 6"    Forward Step Up Limitations trial of 4" x 3 reps, too easy - progressed to 6"    Wall Squat 1 set;10 reps   quarter squat     Knee/Hip Exercises: Seated   Long Arc Quad Left;1 set;10 reps    Other Seated Knee/Hip Exercises seated scoot x 15 sec x 2 reps    Other Seated Knee/Hip Exercises bilat toe/heel raises x 10      Knee/Hip Exercises: Prone   Other Prone Exercises Lt prone quad set 10 sec x 5 reps      Vasopneumatic   Number Minutes Vasopneumatic  5 minutes - stopped early, not tolerated due to pain in Lt calf/thigh   Vasopnuematic Location  Knee   Lt   Vasopneumatic Pressure Low    Vasopneumatic Temperature  34                      PT Short Term Goals - 05/29/21 1712       PT SHORT TERM GOAL #1   Title Independent in inital HEP    Time 6    Period Weeks    Status New    Target Date 07/10/21      PT SHORT TERM GOAL #2   Title normal gait pattern with cane to no assistive device    Time 6    Period Weeks    Status New    Target Date 07/10/21      PT SHORT TERM GOAL #3    Title AROM Lt knee -3 deg extension to 100 deg flexion    Time 6    Period Weeks    Status New    Target Date 07/10/21  PT Long Term Goals - 05/29/21 1714       PT LONG TERM GOAL #1   Title The patient will be indep with HEP including community aquatic pool program as appropriate    Time 12    Period Weeks    Status New    Target Date 08/21/21      PT LONG TERM GOAL #2   Title 5/5 strength Lt LE    Baseline -    Time 12    Period Weeks    Status New    Target Date 08/21/21      PT LONG TERM GOAL #3   Title Patient reports return to normal functional activities including walking for 20 min without increased symptoms    Baseline -    Time 12    Period Weeks    Status New    Target Date 08/21/21      PT LONG TERM GOAL #4   Title AROM Lt knee 0 deg extension to 110 deg flexion    Baseline -    Time 12    Period Weeks    Status New    Target Date 08/21/21      PT LONG TERM GOAL #5   Title Improve functional limitation score to 53    Time 12    Period Weeks    Status New    Target Date 08/21/21                   Plan - 06/05/21 1512     Clinical Impression Statement Pt able to ascend 6" steps with little difficulty.  Limited tolerance for prone quad stretch due to discomfort in shoulders and ant knee, and vaso due to pain in calf.  Encouraged pt to relax ankle and knee during swing through phase to assist with decreasing shin discomfort.  Progressing well towards goals.    Rehab Potential Good    PT Frequency 2x / week    PT Duration 6 weeks    PT Treatment/Interventions ADLs/Self Care Home Management;Aquatic Therapy;Cryotherapy;Electrical Stimulation;Iontophoresis 4mg /ml Dexamethasone;Moist Heat;Ultrasound;Gait training;Stair training;Functional mobility training;Therapeutic activities;Therapeutic exercise;Balance training;Neuromuscular re-education;Patient/family education;Manual techniques;Passive range of motion;Dry  needling;Taping;Vasopneumatic Device    PT Next Visit Plan progress with ROM and strength Lt LE; gait training; modalities as indicated.    PT Home Exercise Plan [place] and Agree with Plan of Care Patient             Patient will benefit from skilled therapeutic intervention in order to improve the following deficits and impairments:  Abnormal gait, Decreased range of motion, Difficulty walking, Pain, Decreased activity tolerance, Decreased balance, Impaired flexibility, Decreased mobility, Decreased strength, Increased edema, Postural dysfunction  Visit Diagnosis: Stiffness of left knee  Weakness generalized  Other symptoms and signs involving the musculoskeletal system  Chronic pain of left knee     Problem List Patient Active Problem List   Diagnosis Date Noted   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[place]) 05/10/2021   DDD (degenerative disc disease), cervical 12/03/2020   Severe pain of left shoulder 11/05/2020   Fibromyalgia 03/05/2020   Knee pain, acute 03/05/2020   Osteoarthritis 03/05/2020   Diverticulitis of sigmoid colon 08/01/2019   Generalized anxiety disorder with panic attacks 08/01/2019   Unspecified atrial fibrillation 06/18/2019   Major depressive disorder 09/09/2018   Dry eye syndrome of both eyes 09/09/2018   Microscopic hematuria 09/09/2018  Non-seasonal allergic rhinitis 09/09/2018   Hyperlipidemia 09/09/2018   Essential hypertension 09/09/2018   History of palpitations 09/09/2018   Chronic bilateral low back pain without sciatica 10/07/2016   Chronic bilateral upper abdominal pain 10/07/2016   Primary osteoarthritis of both knees post bilateral hemiarthroplasty 10/07/2016   Candidosis of skin 04/17/2016   Cherry angioma 04/17/2016   Dry skin 04/17/2016   Dermal nevus of left forearm 04/17/2016   Multiple benign nevi 04/17/2016   Scar condition  and fibrosis of skin 04/17/2016   Sebaceous hyperplasia 04/17/2016   Seborrheic keratoses 04/17/2016   Skin tags, multiple acquired 04/17/2016   Solar lentigo 04/17/2016   S/P laparoscopic fundoplication 12/04/347   Gastroesophageal reflux disease without esophagitis 11/06/2015   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   Primary osteoarthritis of right knee 11/21/2014   Left knee DJD 03/27/2014   Palpitations 09/28/2013   BMI 30.0-30.9,adult 01/27/2013   Subarachnoid hemorrhage following injury [address], PTA 06/05/21 5:11 PM  [place], [place], [address] Phone: [phone]   Fax:  [phone]  Name: Chevonne Bostrom MRN: [id number] Date of Birth: Aug 23, 1951

## 2021-06-10 ENCOUNTER — Other Ambulatory Visit: Payer: Self-pay

## 2021-06-10 ENCOUNTER — Encounter: Payer: Self-pay | Admitting: Rehabilitative and Restorative Service Providers"

## 2021-06-10 ENCOUNTER — Ambulatory Visit (INDEPENDENT_AMBULATORY_CARE_PROVIDER_SITE_OTHER): Payer: Medicare Other | Admitting: Rehabilitative and Restorative Service Providers"

## 2021-06-10 DIAGNOSIS — R269 Unspecified abnormalities of gait and mobility: Secondary | ICD-10-CM

## 2021-06-10 DIAGNOSIS — G8929 Other chronic pain: Secondary | ICD-10-CM | POA: Diagnosis not present

## 2021-06-10 DIAGNOSIS — M25662 Stiffness of left knee, not elsewhere classified: Secondary | ICD-10-CM

## 2021-06-10 DIAGNOSIS — R531 Weakness: Secondary | ICD-10-CM

## 2021-06-10 DIAGNOSIS — R29898 Other symptoms and signs involving the musculoskeletal system: Secondary | ICD-10-CM

## 2021-06-10 DIAGNOSIS — M25562 Pain in left knee: Secondary | ICD-10-CM | POA: Diagnosis not present

## 2021-06-10 NOTE — Patient Instructions (Addendum)
Standing   Weight shift side to side   Weight shift with one foot forward then the other  Slow walking step and stop - walking forward and backward   Access Code: [email or web address]: [email or web address]: 07/18/2022Prepared by: Arlette Schaad HoltExercises  Supine Quad Set - 3 x daily - 7 x weekly - 10 reps - 1 sets - 10 hold  Supine Heel Slide with Strap - 3 x daily - 7 x weekly - 10 reps - 1 sets - 10 hold  Supine Short Arc Quad - 3 x daily - 7 x weekly - 10 reps - 1 sets  Supine Active Straight Leg Raise - 3 x daily - 7 x weekly - 10 reps - 1 sets  Seated Knee Flexion Extension AROM - 3 x daily - 7 x weekly - 10 reps - 1 sets  Seated Long Arc Quad - 3 x daily - 7 x weekly - 10 reps - 1 sets  Hooklying Hamstring Stretch with Strap - 2 x daily - 7 x weekly - 1 sets - 3 reps - 30 sec hold  Sidelying Hip Abduction - 2 x daily - 7 x weekly - 2-3 sets - 10 reps - 3-5 sec hold  Supine ITB Stretch with Strap - 2 x daily - 7 x weekly - 1 sets - 3 reps - 30 sec hold  Hip Adductors and Hamstring Stretch with Strap - 2 x daily - 7 x weekly - 1 sets - 3 reps - 30 sec hold

## 2021-06-10 NOTE — Therapy (Signed)
[place], [place], [address] Phone: [phone]   Fax:  [phone]  Physical Therapy Treatment  Patient Details  Name: Melissa Rodgers MRN: [id number] Date of Birth: February 24, 1951 Referring Provider (PT): Dr Dorna Leitz   Encounter Date: 06/10/2021   PT End of Session - 06/10/21 1433     Visit Number 4    Number of Visits 24    Date for PT Re-Evaluation 08/21/21    PT Start Time 1430    PT Stop Time 1516    PT Time Calculation (min) 46 min    Activity Tolerance Patient tolerated treatment well             Past Medical History:  Diagnosis Date   Anemia    Atrial fibrillation 06/18/2019   BMI 30.0-30.9,adult 01/27/2013   Candidosis of skin 04/17/2016   Formatting of this note might be different from the original. 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 01/29/15:  24 Hour Holter Monitor CONCLUSIONS: 1. Overall normal 24-hour Holter monitor recording, with rare,  brief,  asymptomatic nonsustained supraventricular tachycardia as described.  2. No cardiac rhythm explanation for patient's symptoms.       Primary osteoarthritis of both knees 10/07/2016   Formatting of this note might be different from the original. 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   Primary osteoarthritis of right knee 11/21/2014   S/P laparoscopic fundoplication 8/93/8101   Scar condition and fibrosis of skin 04/17/2016   Formatting of this note might be different from the original. lower abdomen   Sebaceous hyperplasia 04/17/2016   Seborrheic keratoses 04/17/2016   Skin tags, multiple acquired 04/17/2016   Solar lentigo 04/17/2016   Subarachnoid hemorrhage following injury 2010   Fall from horse; brief hospitalization    Past Surgical History:  Procedure Laterality Date   ESOPHAGOGASTRODUODENOSCOPY     possibly last one was 2017. In [place]   HERNIA REPAIR     MEDIAL PARTIAL KNEE REPLACEMENT Right 75/08/2584   NISSEN FUNDOPLICATION  27/78/2423   OOPHORECTOMY     PARTIAL KNEE ARTHROPLASTY Left 04/05/2014   TOTAL KNEE REVISION Left 05/10/2021   Procedure: TOTAL KNEE REVISION;  Surgeon: Dorna Leitz, MD;  Location: [facility];  Service: Orthopedics;  Laterality: Left;    There were no vitals filed for this visit.   Subjective Assessment - 06/10/21 1433     Subjective Patient reports continued soreness and sensitivity in the Lt knee and shin. The manual work and taping seem to help. Working  on massage at home which helps. Has a stationary bike at home she has used to rock knee back and forth without going around full revolutions.    Currently in Pain? Yes    Pain Score 3     Pain Location Knee    Pain Orientation Left    Pain Descriptors / Indicators Aching;Tightness;Sore    Pain Type Surgical pain;Chronic pain    Pain Radiating Towards anterior shin    Pain Onset More than a month ago    Pain Frequency Intermittent    Aggravating Factors   twisting motions    Pain Relieving Factors ice, meds, rest, massage                [facility] PT Assessment - 06/10/21 0001       Assessment   Medical Diagnosis Lt TKA   revision of partial knee replacement 2015   Referring Provider (PT) Dr Dorna Leitz    Onset Date/Surgical Date 05/10/21    Hand Dominance Left    Next MD Visit 06/13/21    Prior Therapy here for knee and shoulder      AROM   Right Knee Extension 3    Right Knee Flexion 133    Left Knee Extension -2    Left Knee Flexion 109      Palpation   Palpation comment tender anterior shin and medial/lateral knee Lt LE                           [facility] Adult PT Treatment/Exercise - 06/10/21 0001       Ambulation/Gait   Gait Comments working on gait walking forward/backward ~ 15 ft x 8 reps; forward gait x 40 ft x 2      Knee/Hip Exercises: Stretches   Passive Hamstring Stretch Left;2 reps;30 seconds   seated with hip hinge   Quad Stretch Left;3 reps;20 seconds    Quad Stretch Limitations seated pt pulling knee into flexion with strap/pillow under distal thigh    ITB Stretch Left;2 reps;30 seconds   supine with strap   Other Knee/Hip Stretches ankle DF stretch pointing toes down 10 sec hold x 3 reps; ankle circles; ankle pumps    Other Knee/Hip Stretches hip adductor stretch with knee in extension - supine with strap 2 reps x 30 sec      Knee/Hip Exercises: Aerobic   Recumbent Bike partial revolutions to backward full revolutions for ROM x 7 min      Knee/Hip Exercises: Standing   Other Standing Knee Exercises wt shift side to side and midstance with each foot forward x 10 each      Knee/Hip Exercises: Seated   Sit to Sand 10 reps;without UE support      Knee/Hip Exercises: Supine   Quad Sets AROM;Strengthening;Left    Heel Slides AAROM;Left;5 reps   with strap assist   Straight Leg Raises AROM;Strengthening;Left;5 reps      Modalities   Modalities --   declined modalities - to ice at home      Manual Therapy   Manual therapy comments pt supine    Soft tissue mobilization STM and myofacial relese Lt shin and lateral calf to pt's tolerance    Kinesiotex --   1 strip lateral shin to improve tissue extensibility; 1 strip medial, 1 strip lateral knee to provide support and decrease pain  PT Education - 06/10/21 1513     Education Details HEP    Person(s) Educated Patient    Methods Explanation;Demonstration;Tactile cues;Verbal cues;Handout    Comprehension Verbalized understanding;Returned demonstration;Verbal cues required;Tactile cues required              PT Short Term Goals - 05/29/21 1712       PT SHORT TERM GOAL #1   Title Independent in inital HEP    Time 6    Period Weeks    Status New    Target Date 07/10/21      PT SHORT TERM GOAL #2   Title normal gait pattern with cane to no assistive device    Time 6    Period Weeks    Status New    Target Date 07/10/21      PT SHORT TERM GOAL #3   Title AROM Lt knee -3 deg extension to 100 deg flexion    Time 6    Period Weeks    Status New    Target Date 07/10/21               PT Long Term Goals - 05/29/21 1714       PT LONG TERM GOAL #1   Title The patient will be indep with HEP including community aquatic pool program as appropriate    Time 12    Period Weeks    Status New    Target Date 08/21/21      PT LONG TERM GOAL #2   Title 5/5 strength Lt LE    Baseline -    Time 12    Period Weeks    Status New    Target Date 08/21/21      PT LONG TERM GOAL #3   Title Patient reports return to normal functional activities including walking for 20 min without increased symptoms    Baseline -    Time 12    Period Weeks    Status New    Target Date 08/21/21      PT LONG TERM GOAL #4   Title AROM Lt knee 0 deg extension to 110 deg flexion    Baseline -    Time 12    Period Weeks    Status New    Target Date 08/21/21      PT LONG TERM GOAL #5   Title Improve  functional limitation score to 53    Time 12    Period Weeks    Status New    Target Date 08/21/21                   Plan - 06/10/21 1436     Clinical Impression Statement Patient continues to improve with Lt TKA rehab. She has continued hypersensitivity along Lt shin and medial/lateral Lt knee. Trial of kinesotaping to address hypersensitivity. ROM, strength and function are improving. Gait continues to improve with improved wt shift to the Lt and increased swing through with Lt LE.  Progressing gradually toward stated goals of therapy.    Rehab Potential Good    PT Frequency 2x / week    PT Duration 6 weeks    PT Treatment/Interventions ADLs/Self Care Home Management;Aquatic Therapy;Cryotherapy;Electrical Stimulation;Iontophoresis 4mg /ml Dexamethasone;Moist Heat;Ultrasound;Gait training;Stair training;Functional mobility training;Therapeutic activities;Therapeutic exercise;Balance training;Neuromuscular re-education;Patient/family education;Manual techniques;Passive range of motion;Dry needling;Taping;Vasopneumatic Device    PT Next Visit Plan progress with ROM and strength Lt LE; gait training; modalities as indicated.    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[place]) 05/10/2021   DDD (degenerative disc disease), cervical 12/03/2020   Severe pain of left shoulder 11/05/2020   Fibromyalgia 03/05/2020   Knee pain, acute 03/05/2020    Osteoarthritis 03/05/2020   Diverticulitis of sigmoid colon 08/01/2019   Generalized anxiety disorder with panic attacks 08/01/2019   Unspecified atrial fibrillation 06/18/2019   Major depressive disorder 09/09/2018   Dry eye syndrome of both eyes 09/09/2018   Microscopic hematuria 09/09/2018   Non-seasonal allergic rhinitis 09/09/2018   Hyperlipidemia 09/09/2018   Essential hypertension 09/09/2018   History of palpitations 09/09/2018   Chronic bilateral low back pain without sciatica 10/07/2016   Chronic bilateral upper abdominal pain 10/07/2016   Primary osteoarthritis of both knees post bilateral hemiarthroplasty 10/07/2016   Candidosis of skin 04/17/2016   Cherry angioma 04/17/2016   Dry skin 04/17/2016   Dermal nevus of left forearm 04/17/2016   Multiple benign nevi 04/17/2016   Scar condition and fibrosis of skin 04/17/2016   Sebaceous hyperplasia 04/17/2016   Seborrheic keratoses 04/17/2016   Skin tags, multiple acquired 04/17/2016   Solar lentigo 04/17/2016   S/P laparoscopic fundoplication 39/76/7341   Gastroesophageal reflux disease without esophagitis 11/06/2015   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   Primary osteoarthritis of right knee 11/21/2014   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

## 2021-06-11 DIAGNOSIS — T84033D 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, subsequent encounter: Secondary | ICD-10-CM | POA: Diagnosis not present

## 2021-06-12 ENCOUNTER — Ambulatory Visit: Payer: Medicare Other

## 2021-06-12 ENCOUNTER — Other Ambulatory Visit: Payer: Medicare Other

## 2021-06-13 ENCOUNTER — Ambulatory Visit (INDEPENDENT_AMBULATORY_CARE_PROVIDER_SITE_OTHER): Payer: Medicare Other | Admitting: Physical Therapy

## 2021-06-13 ENCOUNTER — Encounter: Payer: Self-pay | Admitting: Physical Therapy

## 2021-06-13 ENCOUNTER — Other Ambulatory Visit: Payer: Self-pay

## 2021-06-13 DIAGNOSIS — M25662 Stiffness of left knee, not elsewhere classified: Secondary | ICD-10-CM

## 2021-06-13 DIAGNOSIS — R531 Weakness: Secondary | ICD-10-CM | POA: Diagnosis not present

## 2021-06-13 DIAGNOSIS — R29898 Other symptoms and signs involving the musculoskeletal system: Secondary | ICD-10-CM

## 2021-06-13 DIAGNOSIS — M25562 Pain in left knee: Secondary | ICD-10-CM

## 2021-06-13 DIAGNOSIS — G8929 Other chronic pain: Secondary | ICD-10-CM | POA: Diagnosis not present

## 2021-06-13 DIAGNOSIS — R269 Unspecified abnormalities of gait and mobility: Secondary | ICD-10-CM

## 2021-06-13 NOTE — Therapy (Signed)
[place] [address] [place], [place], [address] Phone: [phone]   Fax:  [phone]  Physical Therapy Treatment  Patient Details  Name: Melissa Rodgers MRN: [id number] Date of Birth: [birth year] Referring Provider (PT): Dr Dorna Leitz   Encounter Date: 06/13/2021   PT End of Session - 06/13/21 1505     Visit Number 5    Number of Visits 24    Date for PT Re-Evaluation 08/21/21    PT Start Time 1435    PT Stop Time 1518    PT Time Calculation (min) 43 min    Activity Tolerance Patient tolerated treatment well    Behavior During Therapy [facility] for tasks assessed/performed             Past Medical History:  Diagnosis Date   Anemia    Atrial fibrillation 06/18/2019   BMI 30.0-30.9,adult 01/27/2013   Candidosis of skin 04/17/2016   Formatting of this note might be different from the original. 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 01/29/15:  24 Hour Holter Monitor CONCLUSIONS: 1.  Overall normal 24-hour Holter monitor recording, with rare, brief,  asymptomatic nonsustained supraventricular tachycardia as described.  2. No cardiac rhythm explanation for patient's symptoms.       Primary osteoarthritis of both knees 10/07/2016   Formatting of this note might be different from the original. 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   Primary osteoarthritis of right knee 11/21/2014   S/P laparoscopic fundoplication 07/02/9832   Scar condition and fibrosis of skin 04/17/2016   Formatting of this note might be different from the original. lower abdomen   Sebaceous hyperplasia 04/17/2016   Seborrheic keratoses 04/17/2016   Skin tags, multiple acquired 04/17/2016   Solar lentigo 04/17/2016   Subarachnoid hemorrhage following injury 2010   Fall from horse; brief hospitalization    Past Surgical History:  Procedure Laterality Date   ESOPHAGOGASTRODUODENOSCOPY     possibly last one was 2017. In [place]   HERNIA REPAIR     MEDIAL PARTIAL KNEE REPLACEMENT Right 82/50/5397   NISSEN FUNDOPLICATION  67/34/1937   OOPHORECTOMY     PARTIAL KNEE ARTHROPLASTY Left 04/05/2014   TOTAL KNEE REVISION Left 05/10/2021   Procedure: TOTAL KNEE REVISION;  Surgeon: Dorna Leitz, MD;  Location: [facility];  Service: Orthopedics;  Laterality: Left;    There were no vitals filed for this visit.   Subjective Assessment - 06/13/21 1438     Subjective Pt reports that she had follow up with surgeon today.  He was pleased with progress and incision, "he said I have 0-120 deg".  She is taking pain medicine prior to PT appts.    Patient Stated Goals improve flexibility and recovery from knee surgery    Currently in Pain? Yes    Pain Score 2     Pain Location Knee    Pain Orientation Left    Pain Descriptors / Indicators Aching    Pain Onset More than a month ago    Aggravating Factors  twisting motions, stairs    Pain Relieving Factors ice, medication, rest                 [facility] PT Assessment - 06/13/21 0001       Assessment   Medical Diagnosis Lt TKA   revision of partial knee replacement 2015   Referring Provider (PT) Dr Dorna Leitz    Onset Date/Surgical Date 05/10/21    Hand Dominance Left    Next MD Visit 07/11/21    Prior Therapy here for knee and shoulder              [facility] Adult PT Treatment/Exercise - 06/13/21 0001       Knee/Hip Exercises: Stretches   Passive Hamstring Stretch Left;2 reps;30 seconds;Right;1 rep;20 seconds   seated with hip hinge   Other Knee/Hip Stretches seated with Lt LE flexed , stretching ant tib x 30 sec x 2    Other Knee/Hip Stretches hip adductor stretch in standing, leaning on counter x 15 sec x 2.  Lt calf stretch x 20 sec x 2.      Knee/Hip Exercises: Aerobic   Recumbent Bike partial revolutions to backward full revolutions for ROM x 6 min      Knee/Hip Exercises: Standing   Heel Raises Both;1 set;8 reps - some discomfort in shin   Forward Step Up Left;1 set;10 reps;Hand Hold: 2;Step Height: 6"    Step Down 1 set;10 reps;Right;Hand Hold: 2;Step Height: 4";Step Height: 6"   (combo of 4", 6'")   Stairs reciprical pattern with bilat rail, over 4-6" steps x 5 stairs.      Knee/Hip Exercises: Seated   Sit to Sand 10 reps;without UE support, eccentric lowering     Vasopneumatic   Number Minutes Vasopneumatic  --   declined     Manual Therapy   Soft tissue mobilization STM to Lt lateral quad/ITB, Lt ant tib to decrease fascial restrictions and improve mobility.    Kinesiotex IT sales professional I strips of sensitive skin Rock tape applied to [employer] strip crossing at mid-shin to assist with decompression of area and to increase proprioception.                      PT Short Term Goals - 06/13/21 1531       PT SHORT TERM GOAL #1   Title Independent in inital HEP    Time 6    Period Weeks    Status Achieved    Target Date 07/10/21      PT SHORT TERM  GOAL #2   Title normal gait pattern with cane to no assistive device    Time 6    Period Weeks    Status Achieved    Target Date 07/10/21      PT SHORT TERM GOAL #3   Title AROM Lt knee -3 deg extension to 100 deg flexion    Time  6    Period Weeks    Status On-going    Target Date 07/10/21               PT Long Term Goals - 05/29/21 1714       PT LONG TERM GOAL #1   Title The patient will be indep with HEP including community aquatic pool program as appropriate    Time 12    Period Weeks    Status New    Target Date 08/21/21      PT LONG TERM GOAL #2   Title 5/5 strength Lt LE    Baseline -    Time 12    Period Weeks    Status New    Target Date 08/21/21      PT LONG TERM GOAL #3   Title Patient reports return to normal functional activities including walking for 20 min without increased symptoms    Baseline -    Time 12    Period Weeks    Status New    Target Date 08/21/21      PT LONG TERM GOAL #4   Title AROM Lt knee 0 deg extension to 110 deg flexion    Baseline -    Time 12    Period Weeks    Status New    Target Date 08/21/21      PT LONG TERM GOAL #5   Title Improve functional limitation score to 53    Time 12    Period Weeks    Status New    Target Date 08/21/21                   Plan - 06/13/21 1503     Clinical Impression Statement Improved control with eccentric lowering during sit to/from stand.  Continued report of pain in Lt shin area with gait.  Pt point tender in Lt lateral quad/ITB; recommended self massage to this area.  She reported some relief with ant tib stretch and STM/tape to area.  Has met STG#2. Progressing well towards goals.    Rehab Potential Good    PT Frequency 2x / week    PT Duration 6 weeks    PT Treatment/Interventions ADLs/Self Care Home Management;Aquatic Therapy;Cryotherapy;Electrical Stimulation;Iontophoresis 62m/ml Dexamethasone;Moist Heat;Ultrasound;Gait training;Stair training;Functional mobility  training;Therapeutic activities;Therapeutic exercise;Balance training;Neuromuscular re-education;Patient/family education;Manual techniques;Passive range of motion;Dry needling;Taping;Vasopneumatic Device    PT Next Visit Plan progress with ROM and strength Lt LE; gait training; retape as needed.    PT Home Exercise Plan [place] Agree with Plan of Care Patient             Patient will benefit from skilled therapeutic intervention in order to improve the following deficits and impairments:  Abnormal gait, Decreased range of motion, Difficulty walking, Pain, Decreased activity tolerance, Decreased balance, Impaired flexibility, Decreased mobility, Decreased strength, Increased edema, Postural dysfunction  Visit Diagnosis: Stiffness of left knee  Weakness generalized  Other symptoms and signs involving the musculoskeletal system  Chronic pain of left knee  Abnormal gait     Problem List Patient Active Problem List   Diagnosis Date Noted   Loosening of [place]ar knee replacement ([facility] 05/10/2021   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[facility] 05/10/2021   DDD (degenerative disc disease), cervical 12/03/2020   Severe pain of left shoulder 11/05/2020   Fibromyalgia 03/05/2020   Knee pain, acute 03/05/2020   Osteoarthritis 03/05/2020   Diverticulitis of sigmoid colon 08/01/2019  Generalized anxiety disorder with panic attacks 08/01/2019   Unspecified atrial fibrillation 06/18/2019   Major depressive disorder 09/09/2018   Dry eye syndrome of both eyes 09/09/2018   Microscopic hematuria 09/09/2018   Non-seasonal allergic rhinitis 09/09/2018   Hyperlipidemia 09/09/2018   Essential hypertension 09/09/2018   History of palpitations 09/09/2018   Chronic bilateral low back pain without sciatica 10/07/2016   Chronic bilateral upper abdominal pain 10/07/2016   Primary osteoarthritis of both knees post bilateral hemiarthroplasty 10/07/2016    Candidosis of skin 04/17/2016   Cherry angioma 04/17/2016   Dry skin 04/17/2016   Dermal nevus of left forearm 04/17/2016   Multiple benign nevi 04/17/2016   Scar condition and fibrosis of skin 04/17/2016   Sebaceous hyperplasia 04/17/2016   Seborrheic keratoses 04/17/2016   Skin tags, multiple acquired 04/17/2016   Solar lentigo 04/17/2016   S/P laparoscopic fundoplication 19/41/7408   Gastroesophageal reflux disease without esophagitis 11/06/2015   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   Primary osteoarthritis of right knee 11/21/2014   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

## 2021-06-18 ENCOUNTER — Ambulatory Visit (INDEPENDENT_AMBULATORY_CARE_PROVIDER_SITE_OTHER): Payer: Medicare Other | Admitting: Rehabilitative and Restorative Service Providers"

## 2021-06-18 ENCOUNTER — Other Ambulatory Visit: Payer: Self-pay

## 2021-06-18 ENCOUNTER — Encounter: Payer: Self-pay | Admitting: Rehabilitative and Restorative Service Providers"

## 2021-06-18 DIAGNOSIS — R531 Weakness: Secondary | ICD-10-CM | POA: Diagnosis not present

## 2021-06-18 DIAGNOSIS — M25662 Stiffness of left knee, not elsewhere classified: Secondary | ICD-10-CM | POA: Diagnosis not present

## 2021-06-18 DIAGNOSIS — R269 Unspecified abnormalities of gait and mobility: Secondary | ICD-10-CM | POA: Diagnosis not present

## 2021-06-18 DIAGNOSIS — R29898 Other symptoms and signs involving the musculoskeletal system: Secondary | ICD-10-CM

## 2021-06-18 NOTE — Therapy (Signed)
[place], [place], [address] Phone: [phone]   Fax:  [phone]  Physical Therapy Treatment  Patient Details  Name: Melissa Rodgers MRN: [id number] Date of Birth: [birth year] Referring Provider (PT): Dr Dorna Leitz   Encounter Date: 06/18/2021   PT End of Session - 06/18/21 1153     Visit Number 6    Number of Visits 24    Date for PT Re-Evaluation 08/21/21    PT Start Time 1148    PT Stop Time 1228    PT Time Calculation (min) 40 min    Activity Tolerance Patient tolerated treatment well    Behavior During Therapy [facility] for tasks assessed/performed             Past Medical History:  Diagnosis Date   Anemia    Atrial fibrillation 06/18/2019   BMI 30.0-30.9,adult 01/27/2013   Candidosis of skin 04/17/2016   Formatting of this note might be different from the original. 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 01/29/15:  24 Hour Holter Monitor CONCLUSIONS: 1.  Overall normal 24-hour Holter monitor recording, with rare, brief,  asymptomatic nonsustained supraventricular tachycardia as described.  2. No cardiac rhythm explanation for patient's symptoms.       Primary osteoarthritis of both knees 10/07/2016   Formatting of this note might be different from the original. 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   Primary osteoarthritis of right knee 11/21/2014   S/P laparoscopic fundoplication [id number]   Scar condition and fibrosis of skin 04/17/2016   Formatting of this note might be different from the original. lower abdomen   Sebaceous hyperplasia 04/17/2016   Seborrheic keratoses 04/17/2016   Skin tags, multiple acquired 04/17/2016   Solar lentigo 04/17/2016   Subarachnoid hemorrhage following injury 2010   Fall from horse; brief hospitalization    Past Surgical History:  Procedure Laterality Date   ESOPHAGOGASTRODUODENOSCOPY     possibly last one was 2017. In [place]   HERNIA REPAIR     MEDIAL PARTIAL KNEE REPLACEMENT Right 0000000   NISSEN FUNDOPLICATION  [id number]   OOPHORECTOMY     PARTIAL KNEE ARTHROPLASTY Left 04/05/2014   TOTAL KNEE REVISION Left 05/10/2021   Procedure: TOTAL KNEE REVISION;  Surgeon: Dorna Leitz, MD;  Location: [facility];  Service: Orthopedics;  Laterality: Left;    There were no vitals filed for this visit.   Subjective Assessment - 06/18/21 1151     Subjective The patient reports she continue with swelling and she gets a  band of stiffness around the knee joint and then she gets pain with repositioning in bed (lateral L thigh and gastroc medially).    Pertinent History Rt partial knee replacement 2016; a-fib; HTN; mild anxiety/depression; arthritis; fundoplication 0000000; shoulder pain; neck pain    Patient Stated Goals improve flexibility and recovery from knee surgery    Currently in Pain? Yes    Pain Score 4     Pain Location Knee    Pain Orientation Left    Pain Descriptors /  Indicators Aching;Tightness    Pain Type Chronic pain;Surgical pain    Pain Onset More than a month ago    Pain Frequency Intermittent    Aggravating Factors  moving wrong    Pain Relieving Factors ice, medication, rest                [facility] PT Assessment - 06/18/21 1153       Assessment   Medical Diagnosis Lt TKA    Referring Provider (PT) Dr Dorna Leitz    Onset Date/Surgical Date 05/10/21    Hand Dominance Left    Next MD Visit 07/11/21      AROM   Left Knee Extension 4    Left Knee Flexion 112                           [facility] Adult PT Treatment/Exercise - 06/18/21 1154       Self-Care   Self-Care Other Self-Care Comments    Other Self-Care Comments  muscle rolling in sitting for L lateral thigh and gastroc to toleance      Exercises   Exercises Knee/Hip      Knee/Hip Exercises: Stretches   Press photographer Left;1 rep;30 seconds    Other Knee/Hip Stretches supine hip adductor stretch      Knee/Hip Exercises: Aerobic   Recumbent Bike full revolution bike x 4 minutes at slow pace to tolerance      Knee/Hip Exercises: Field seismologist for Teacher, English as a foreign language --      Knee/Hip Exercises: Standing   Heel Raises Both;10 reps    Heel Raises Limitations toe raises x 10 reps    Knee Flexion Strengthening;Left;10 reps    Knee Flexion Limitations repeated on the R side to do SLS on the left side    Gait Training worked on L leg stance during gait without flexing    Other Standing Knee Exercises weight shift ant/posterior with cues on terminal knee extension      Knee/Hip Exercises: Supine   Quad Sets AROM;Strengthening;Left;5 reps    Straight Leg Raises AROM;Strengthening;Left;10 reps    Patellar Mobs patellarl mobilization med/lat and sup/inferior      Knee/Hip Exercises: Sidelying   Hip ABduction AROM;Strengthening;Left;10 reps      Knee/Hip Exercises: Prone   Hamstring Curl 1 set;10 reps    Other Prone Exercises isometric holds t/o knee  flexion AROM x 5 seconds x 3 sets      Manual Therapy   Manual Therapy Soft tissue mobilization    Manual therapy comments scar tissue massage in supine with instruction/handout for HEP   prone for STM   Soft tissue mobilization STM to L lateral quad, medial HS      Kinesiotix   Create Space I strips sensitive tape to lateral thigh and medial HS                    PT Education -  06/18/21 1230     Education Details HEP modified/updated    Person(s) Educated Patient    Methods Explanation;Demonstration    Comprehension Verbalized understanding;Returned demonstration              PT Short Term Goals - 06/18/21 1520       PT SHORT TERM GOAL #1   Title Independent in inital HEP    Time 6    Period Weeks    Status Achieved    Target Date 07/10/21      PT SHORT TERM GOAL #2   Title normal gait pattern with cane to no assistive device    Time 6    Period Weeks    Status Achieved    Target Date 07/10/21      PT SHORT TERM GOAL #3   Title AROM Lt knee -3 deg extension to 100 deg flexion    Baseline 4 to 112 degrees on 7/26    Time 6    Period Weeks    Status On-going    Target Date 07/10/21               PT Long Term Goals - 05/29/21 1714       PT LONG TERM GOAL #1   Title The patient will be indep with HEP including community aquatic pool program as appropriate    Time 12    Period Weeks    Status New    Target Date 08/21/21      PT LONG TERM GOAL #2   Title 5/5 strength Lt LE    Baseline -    Time 12    Period Weeks    Status New    Target Date 08/21/21      PT LONG TERM GOAL #3   Title Patient reports return to normal functional activities including walking for 20 min without increased symptoms    Baseline -    Time 12    Period Weeks    Status New    Target Date 08/21/21      PT LONG TERM GOAL #4   Title AROM Lt knee 0 deg extension to 110 deg flexion    Baseline -    Time 12    Period Weeks    Status New    Target Date  08/21/21      PT LONG TERM GOAL #5   Title Improve functional limitation score to 53    Time 12    Period Weeks    Status New    Target Date 08/21/21                   Plan - 06/18/21 1521     Clinical Impression Statement The patient notes dec'd shin pain today-- continues with discomfort L lateral/distal thigh and medial thigh discomfort.  The patient is improving with ROM.  PT continuing to progress HEP adding some standing strengthening activities today to HEP.  Plan to continue to progress to patient tolerance.    PT Treatment/Interventions ADLs/Self Care Home Management;Aquatic Therapy;Cryotherapy;Electrical Stimulation;Iontophoresis '4mg'$ /ml Dexamethasone;Moist Heat;Ultrasound;Gait training;Stair training;Functional mobility training;Therapeutic activities;Therapeutic exercise;Balance training;Neuromuscular re-education;Patient/family education;Manual techniques;Passive range of motion;Dry needling;Taping;Vasopneumatic Device    PT Next Visit Plan progress with ROM and strength Lt LE; gait training; retape as needed.  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/10/2021   DDD (degenerative disc disease), cervical 12/03/2020   Severe pain of left shoulder 11/05/2020   Fibromyalgia 03/05/2020   Knee pain, acute 03/05/2020   Osteoarthritis 03/05/2020   Diverticulitis of sigmoid colon 08/01/2019    Generalized anxiety disorder with panic attacks 08/01/2019   Unspecified atrial fibrillation 06/18/2019   Major depressive disorder 09/09/2018   Dry eye syndrome of both eyes 09/09/2018   Microscopic hematuria 09/09/2018   Non-seasonal allergic rhinitis 09/09/2018   Hyperlipidemia 09/09/2018   Essential hypertension 09/09/2018   History of palpitations 09/09/2018   Chronic bilateral low back pain without sciatica 10/07/2016   Chronic bilateral upper abdominal pain 10/07/2016   Primary osteoarthritis of both knees post bilateral hemiarthroplasty 10/07/2016   Candidosis of skin 04/17/2016   Cherry angioma 04/17/2016   Dry skin 04/17/2016   Dermal nevus of left forearm 04/17/2016   Multiple benign nevi 04/17/2016   Scar condition and fibrosis of skin 04/17/2016   Sebaceous hyperplasia 04/17/2016   Seborrheic keratoses 04/17/2016   Skin tags, multiple acquired 04/17/2016   Solar lentigo 04/17/2016   S/P laparoscopic fundoplication 0000000   Gastroesophageal reflux disease without esophagitis 11/06/2015   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   Primary osteoarthritis of right knee 11/21/2014   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

## 2021-06-18 NOTE — Patient Instructions (Signed)
Access Code: [email or web address] URL: [email or web address] Date: 06/18/2021 Prepared by: Rudell Cobb  Exercises Active Straight Leg Raise with Quad Set - 2 x daily - 7 x weekly - 1 sets - 10 reps Supine Heel Slide with Strap - 3 x daily - 7 x weekly - 10 reps - 1 sets - 10 hold Hooklying Hamstring Stretch with Strap - 2 x daily - 7 x weekly - 1 sets - 3 reps - 30 sec hold Supine ITB Stretch with Strap - 2 x daily - 7 x weekly - 1 sets - 3 reps - 30 sec hold Hip Adductors and Hamstring Stretch with Strap - 2 x daily - 7 x weekly - 1 sets - 3 reps - 30 sec hold Sidelying Hip Abduction - 2 x daily - 7 x weekly - 2-3 sets - 10 reps - 3-5 sec hold Seated Knee Flexion Extension AROM - 3 x daily - 7 x weekly - 10 reps - 1 sets Seated Long Arc Quad - 3 x daily - 7 x weekly - 10 reps - 1 sets Heel Raises with Counter Support - 2 x daily - 7 x weekly - 1 sets - 10 reps  Patient Education Scar Massage

## 2021-06-20 ENCOUNTER — Encounter: Payer: Medicare Other | Admitting: Physical Therapy

## 2021-06-24 ENCOUNTER — Ambulatory Visit (INDEPENDENT_AMBULATORY_CARE_PROVIDER_SITE_OTHER): Payer: Medicare Other | Admitting: Rehabilitative and Restorative Service Providers"

## 2021-06-24 ENCOUNTER — Other Ambulatory Visit: Payer: Self-pay

## 2021-06-24 DIAGNOSIS — R531 Weakness: Secondary | ICD-10-CM | POA: Diagnosis not present

## 2021-06-24 DIAGNOSIS — M25662 Stiffness of left knee, not elsewhere classified: Secondary | ICD-10-CM | POA: Diagnosis not present

## 2021-06-24 DIAGNOSIS — R29898 Other symptoms and signs involving the musculoskeletal system: Secondary | ICD-10-CM

## 2021-06-24 DIAGNOSIS — R269 Unspecified abnormalities of gait and mobility: Secondary | ICD-10-CM | POA: Diagnosis not present

## 2021-06-24 NOTE — Therapy (Signed)
[place], [place], [address] Phone: [phone]   Fax:  [phone]  Physical Therapy Treatment  Patient Details  Name: Melissa Rodgers MRN: [id number] Date of Birth: 09/26/1951 Referring Provider (PT): Dr Dorna Leitz   Encounter Date: 06/24/2021   PT End of Session - 06/24/21 1348     Visit Number 7    Number of Visits 24    Date for PT Re-Evaluation 08/21/21    PT Start Time [id number]    PT Stop Time 1428    PT Time Calculation (min) 43 min    Activity Tolerance Patient tolerated treatment well    Behavior During Therapy [facility] for tasks assessed/performed             Past Medical History:  Diagnosis Date   Anemia    Atrial fibrillation 06/18/2019   BMI 30.0-30.9,adult 01/27/2013   Candidosis of skin 04/17/2016   Formatting of this note might be different from the original. 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 01/29/15:  24 Hour Holter Monitor CONCLUSIONS: 1.  Overall normal 24-hour Holter monitor recording, with rare, brief,  asymptomatic nonsustained supraventricular tachycardia as described.  2. No cardiac rhythm explanation for patient's symptoms.       Primary osteoarthritis of both knees 10/07/2016   Formatting of this note might be different from the original. 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   Primary osteoarthritis of right knee 11/21/2014   S/P laparoscopic fundoplication [id number]   Scar condition and fibrosis of skin 04/17/2016   Formatting of this note might be different from the original. lower abdomen   Sebaceous hyperplasia 04/17/2016   Seborrheic keratoses 04/17/2016   Skin tags, multiple acquired 04/17/2016   Solar lentigo 04/17/2016   Subarachnoid hemorrhage following injury 2010   Fall from horse; brief hospitalization    Past Surgical History:  Procedure Laterality Date   ESOPHAGOGASTRODUODENOSCOPY     possibly last one was 2017. In [place]   HERNIA REPAIR     MEDIAL PARTIAL KNEE REPLACEMENT Right 0000000   NISSEN FUNDOPLICATION  [id number]   OOPHORECTOMY     PARTIAL KNEE ARTHROPLASTY Left 04/05/2014   TOTAL KNEE REVISION Left 05/10/2021   Procedure: TOTAL KNEE REVISION;  Surgeon: Dorna Leitz, MD;  Location: [facility];  Service: Orthopedics;  Laterality: Left;    There were no vitals filed for this visit.   Subjective Assessment - 06/24/21 1347     Subjective The patient reports she has 5-6/10 pain typically.  She notes  she had to stop the pain meds b/c of the pain she feels once meds wears off.    Pertinent History Rt partial knee replacement 2016; a-fib; HTN; mild anxiety/depression; arthritis; fundoplication 0000000; shoulder pain; neck pain    Patient Stated Goals improve flexibility and recovery from knee surgery    Currently in Pain? Yes    Pain Score 6     Pain Location Knee    Pain Orientation Left    Pain Descriptors / Indicators Aching;Tightness    Pain Type Acute  pain;Surgical pain    Pain Onset More than a month ago    Pain Frequency Intermittent    Aggravating Factors  moving wrong, exercise    Pain Relieving Factors meds, ice                [facility] PT Assessment - 06/24/21 1350       Assessment   Medical Diagnosis Lt TKA    Referring Provider (PT) Dr Dorna Leitz    Onset Date/Surgical Date 05/10/21    Hand Dominance Left    Next MD Visit 07/14/21                           [facility] Adult PT Treatment/Exercise - 06/24/21 1350       Ambulation/Gait   Ambulation/Gait Yes    Ambulation/Gait Assistance 7: Independent    Ambulation Distance (Feet) 350 Feet    Gait Comments gait with metronome working on heel strike and timing      Exercises   Exercises Knee/Hip      Knee/Hip Exercises: Stretches   Active Hamstring Stretch Left;1 rep;30 seconds    Passive Hamstring Stretch Left;1 rep;30 seconds    Gastroc Stretch Left;1 rep;30 seconds    Other Knee/Hip Stretches supine hip adductor stretch PROM      Knee/Hip Exercises: Aerobic   Recumbent Bike partial revolutions x 5 minutes      Knee/Hip Exercises: Standing   Heel Raises Both;10 reps    Heel Raises Limitations attempted L SLS with UE support    Lateral Step Up Right;Left    Lateral Step Up Limitations lateral stepping up/down off 2" foam pad x 5 reps R and L    Wall Squat 1 set;10 reps    Stairs reciprocal pattern on 4" steps, worked on pattern on 6" with reciprocal steps, however increases knee pain    SLS 10-15 seconds    Walking with Sports Cord on XTS backwards/forwards steps and sidestepping 2 steps to each side    Gait Training used metronome for pacing and to improve timing with gait activities    Other Standing Knee Exercises backwards walking x 20 feet x 2 reps    Other Standing Knee Exercises forward marches x 20 feet x 2 reps      Knee/Hip Exercises: Seated   Other Seated Knee/Hip Exercises sit to stand with L knee increased flexion to WB to the L       Knee/Hip Exercises: Supine   Bridges Strengthening;Both;10 reps    Other Supine Knee/Hip Exercises isometric holds supine for flexion/extension      Knee/Hip Exercises: Sidelying   Hip ABduction AROM;Strengthening;Left;10 reps    Hip ABduction Limitations used a pillow to support distal ankle and reduce ROM      Manual Therapy   Manual Therapy Soft tissue mobilization;Passive ROM    Manual therapy comments scar tissue mobilization, STM and passive ROM  Soft tissue mobilization STM adductors, HS and quads    Passive ROM overstretch into flexion, and extension      Kinesiotix   Create Space I strips mid shin to decompression tissue mid shin due to pain                      PT Short Term Goals - 06/18/21 1520       PT SHORT TERM GOAL #1   Title Independent in inital HEP    Time 6    Period Weeks    Status Achieved    Target Date 07/10/21      PT SHORT TERM GOAL #2   Title normal gait pattern with cane to no assistive device    Time 6    Period Weeks    Status Achieved    Target Date 07/10/21      PT SHORT TERM GOAL #3   Title AROM Lt knee -3 deg extension to 100 deg flexion    Baseline 4 to 112 degrees on 7/26    Time 6    Period Weeks    Status On-going    Target Date 07/10/21               PT Long Term Goals - 05/29/21 1714       PT LONG TERM GOAL #1   Title The patient will be indep with HEP including community aquatic pool program as appropriate    Time 12    Period Weeks    Status New    Target Date 08/21/21      PT LONG TERM GOAL #2   Title 5/5 strength Lt LE    Baseline -    Time 12    Period Weeks    Status New    Target Date 08/21/21      PT LONG TERM GOAL #3   Title Patient reports return to normal functional activities including walking for 20 min without increased symptoms    Baseline -    Time 12    Period Weeks    Status New    Target Date 08/21/21      PT LONG TERM GOAL #4   Title AROM Lt knee 0 deg  extension to 110 deg flexion    Baseline -    Time 12    Period Weeks    Status New    Target Date 08/21/21      PT LONG TERM GOAL #5   Title Improve functional limitation score to 53    Time 12    Period Weeks    Status New    Target Date 08/21/21                   Plan - 06/24/21 1506     Clinical Impression Statement The patient responded well to gait training with metronome demonstrating improved gait speed and reciprocal pattern.  Continue working to [place], STM, and flexibility.    PT Treatment/Interventions ADLs/Self Care Home Management;Aquatic Therapy;Cryotherapy;Electrical Stimulation;Iontophoresis '4mg'$ /ml Dexamethasone;Moist Heat;Ultrasound;Gait training;Stair training;Functional mobility training;Therapeutic activities;Therapeutic exercise;Balance training;Neuromuscular re-education;Patient/family education;Manual techniques;Passive range of motion;Dry needling;Taping;Vasopneumatic Device    PT Next Visit Plan progress with ROM and strength Lt LE; gait training; retape as needed.  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   DDD (degenerative disc disease), cervical 12/03/2020   Severe pain of left shoulder 11/05/2020   Fibromyalgia 03/05/2020   Knee pain, acute 03/05/2020   Osteoarthritis 03/05/2020   Diverticulitis of sigmoid colon 08/01/2019   Generalized  anxiety disorder with panic attacks 08/01/2019   Unspecified atrial fibrillation 06/18/2019   Major depressive disorder 09/09/2018   Dry eye syndrome of both eyes 09/09/2018   Microscopic hematuria 09/09/2018   Non-seasonal allergic rhinitis 09/09/2018   Hyperlipidemia 09/09/2018   Essential hypertension 09/09/2018   History of palpitations 09/09/2018   Chronic bilateral low back pain without sciatica 10/07/2016   Chronic bilateral upper abdominal pain 10/07/2016   Primary osteoarthritis of both knees post bilateral hemiarthroplasty 10/07/2016   Candidosis of skin 04/17/2016   Cherry angioma 04/17/2016   Dry skin 04/17/2016   Dermal nevus of left forearm 04/17/2016   Multiple benign nevi 04/17/2016   Scar condition and fibrosis of skin 04/17/2016   Sebaceous hyperplasia 04/17/2016   Seborrheic keratoses 04/17/2016   Skin tags, multiple acquired 04/17/2016   Solar lentigo 04/17/2016   S/P laparoscopic fundoplication 0000000   Gastroesophageal reflux disease without esophagitis 11/06/2015   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   Primary osteoarthritis of right knee 11/21/2014   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 [place], [place], [address] Phone: [phone]   Fax:  [phone]  Name: Melissa Rodgers MRN: [id number] Date of Birth: Sep 15, 1951

## 2021-06-25 ENCOUNTER — Encounter: Payer: Medicare Other | Admitting: Rehabilitative and Restorative Service Providers"

## 2021-06-25 ENCOUNTER — Ambulatory Visit: Payer: Medicare Other | Admitting: Cardiology

## 2021-06-27 ENCOUNTER — Other Ambulatory Visit: Payer: Self-pay

## 2021-06-27 ENCOUNTER — Ambulatory Visit (INDEPENDENT_AMBULATORY_CARE_PROVIDER_SITE_OTHER): Payer: Medicare Other | Admitting: Rehabilitative and Restorative Service Providers"

## 2021-06-27 DIAGNOSIS — R269 Unspecified abnormalities of gait and mobility: Secondary | ICD-10-CM

## 2021-06-27 DIAGNOSIS — M25662 Stiffness of left knee, not elsewhere classified: Secondary | ICD-10-CM

## 2021-06-27 DIAGNOSIS — R531 Weakness: Secondary | ICD-10-CM

## 2021-06-27 DIAGNOSIS — R29898 Other symptoms and signs involving the musculoskeletal system: Secondary | ICD-10-CM | POA: Diagnosis not present

## 2021-06-27 NOTE — Therapy (Signed)
[facility] [place], [place], [address] Phone: [phone]   Fax:  [phone]  Physical Therapy Treatment  Patient Details  Name: Melissa Rodgers MRN: [id number] Date of Birth: 1950-12-13 Referring Provider (PT): Dr Dorna Leitz   Encounter Date: 06/27/2021   PT End of Session - 06/27/21 1449     Visit Number 8    Number of Visits 24    Date for PT Re-Evaluation 08/21/21    PT Start Time 1446    PT Stop Time 1530    PT Time Calculation (min) 44 min    Activity Tolerance Patient tolerated treatment well    Behavior During Therapy [facility] for tasks assessed/performed             Past Medical History:  Diagnosis Date   Anemia    Atrial fibrillation 06/18/2019   BMI 30.0-30.9,adult 01/27/2013   Candidosis of skin 04/17/2016   Formatting of this note might be different from the original. 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 01/29/15:  24 Hour Holter Monitor CONCLUSIONS: 1.  Overall normal 24-hour Holter monitor recording, with rare, brief,  asymptomatic nonsustained supraventricular tachycardia as described.  2. No cardiac rhythm explanation for patient's symptoms.       Primary osteoarthritis of both knees 10/07/2016   Formatting of this note might be different from the original. 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   Primary osteoarthritis of right knee 11/21/2014   S/P laparoscopic fundoplication [id number]   Scar condition and fibrosis of skin 04/17/2016   Formatting of this note might be different from the original. lower abdomen   Sebaceous hyperplasia 04/17/2016   Seborrheic keratoses 04/17/2016   Skin tags, multiple acquired 04/17/2016   Solar lentigo 04/17/2016   Subarachnoid hemorrhage following injury 2010   Fall from horse; brief hospitalization    Past Surgical History:  Procedure Laterality Date   ESOPHAGOGASTRODUODENOSCOPY     possibly last one was 2017. In [place]   HERNIA REPAIR     MEDIAL PARTIAL KNEE REPLACEMENT Right 0000000   NISSEN FUNDOPLICATION  [id number]   OOPHORECTOMY     PARTIAL KNEE ARTHROPLASTY Left 04/05/2014   TOTAL KNEE REVISION Left 05/10/2021   Procedure: TOTAL KNEE REVISION;  Surgeon: Dorna Leitz, MD;  Location: [facility];  Service: Orthopedics;  Laterality: Left;    There were no vitals filed for this visit.   Subjective Assessment - 06/27/21 1448     Subjective The patient reports pain is a little less.  She was  stiff after last PT session.    Pertinent History Rt partial knee replacement 2016; a-fib; HTN; mild anxiety/depression; arthritis; fundoplication 0000000; shoulder pain; neck pain    Patient Stated Goals improve flexibility and recovery from knee surgery    Currently in Pain? Yes    Pain Score 4     Pain Location Knee    Pain Orientation Left;Posterior    Pain Descriptors / Indicators Tightness;Aching;Discomfort    Pain Type Acute pain;Surgical pain    Pain Onset More than a  month ago    Pain Frequency Intermittent    Aggravating Factors  varies    Pain Relieving Factors meds, ice                [facility] PT Assessment - 06/27/21 1454       Assessment   Medical Diagnosis Lt TKA    Referring Provider (PT) Dr Dorna Leitz    Onset Date/Surgical Date 05/10/21    Hand Dominance Left                           [facility] Adult PT Treatment/Exercise - 06/27/21 1454       Bed Mobility   Bed Mobility --   worked on rolling and strategies to reduce L knee pain with rolling R.     Ambulation/Gait   Ambulation/Gait Yes    Ambulation/Gait Assistance 7: Independent    Ambulation Distance (Feet) 350 Feet    Gait Comments Gait with metronome for cues on reciprocal pattern 88 bpm -- demonstrating a more reciprocal gait pattern      Self-Care   Self-Care Other Self-Care Comments    Other Self-Care Comments  muscle rolling in sitting working on medial HS and gastroc      Exercises   Exercises Knee/Hip      Knee/Hip Exercises: Stretches   Active Hamstring Stretch Left;1 rep;30 seconds    Passive Hamstring Stretch Left;1 rep;30 seconds      Knee/Hip Exercises: Aerobic   Recumbent Bike full revolution today x 4 minutes no resistance      Knee/Hip Exercises: Standing   Heel Raises Both;10 reps    Heel Raises Limitations then did 5 reps on R and L sides    Step Down Right;10 reps    Step Down Limitations off edge of 1" foam, then did L weight bearing moving R LE posterior to ground/anterior to ground in front of foam    Wall Squat 10 reps    Stairs reciprocal pattern on 4" steps, worked on pattern on 6" with reciprocal steps, however increases knee pain    SLS 10-15 seconds      Knee/Hip Exercises: Supine   Heel Slides AROM;Left    Heel Slides Limitations wall / heel slides L side to 108 degrees flexion      Knee/Hip Exercises: Prone   Hamstring Curl 10 reps    Hamstring Curl Limitations with isometric holds to relax musculature       Manual Therapy   Manual Therapy Soft tissue mobilization    Manual therapy comments scar tissue and STM to reduce muscle guarding and pain    Soft tissue mobilization STM and IASTM L medial hamstrings, gastroc, gracilis    Passive ROM overstretch into flexion and extension                      PT Short Term Goals - 06/18/21 1520  PT SHORT TERM GOAL #1   Title Independent in inital HEP    Time 6    Period Weeks    Status Achieved    Target Date 07/10/21      PT SHORT TERM GOAL #2   Title normal gait pattern with cane to no assistive device    Time 6    Period Weeks    Status Achieved    Target Date 07/10/21      PT SHORT TERM GOAL #3   Title AROM Lt knee -3 deg extension to 100 deg flexion    Baseline 4 to 112 degrees on 7/26    Time 6    Period Weeks    Status On-going    Target Date 07/10/21               PT Long Term Goals - 05/29/21 1714       PT LONG TERM GOAL #1   Title The patient will be indep with HEP including community aquatic pool program as appropriate    Time 12    Period Weeks    Status New    Target Date 08/21/21      PT LONG TERM GOAL #2   Title 5/5 strength Lt LE    Baseline -    Time 12    Period Weeks    Status New    Target Date 08/21/21      PT LONG TERM GOAL #3   Title Patient reports return to normal functional activities including walking for 20 min without increased symptoms    Baseline -    Time 12    Period Weeks    Status New    Target Date 08/21/21      PT LONG TERM GOAL #4   Title AROM Lt knee 0 deg extension to 110 deg flexion    Baseline -    Time 12    Period Weeks    Status New    Target Date 08/21/21      PT LONG TERM GOAL #5   Title Improve functional limitation score to 53    Time 12    Period Weeks    Status New    Target Date 08/21/21                   Plan - 06/27/21 1636     Clinical Impression Statement The patient is improving with gait with a more reciprocal  pattern and less of an antalgic stance on the L LE.  She is improving with AROm and PROM today.  PT continuing to work on reducing soft tissue tightness in order to reduce pain.    PT Treatment/Interventions ADLs/Self Care Home Management;Aquatic Therapy;Cryotherapy;Electrical Stimulation;Iontophoresis '4mg'$ /ml Dexamethasone;Moist Heat;Ultrasound;Gait training;Stair training;Functional mobility training;Therapeutic activities;Therapeutic exercise;Balance training;Neuromuscular re-education;Patient/family education;Manual techniques;Passive range of motion;Dry needling;Taping;Vasopneumatic Device    PT Next Visit Plan progress with ROM and strength Lt LE; gait training; retape as needed.  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   DDD (degenerative disc disease), cervical 12/03/2020   Severe pain of left shoulder 11/05/2020   Fibromyalgia 03/05/2020   Knee pain, acute 03/05/2020   Osteoarthritis 03/05/2020   Diverticulitis of sigmoid colon 08/01/2019   Generalized anxiety disorder with panic attacks 08/01/2019   Unspecified atrial fibrillation 06/18/2019   Major depressive disorder 09/09/2018   Dry eye syndrome of both eyes 09/09/2018   Microscopic hematuria 09/09/2018   Non-seasonal allergic rhinitis 09/09/2018   Hyperlipidemia 09/09/2018   Essential hypertension 09/09/2018   History of palpitations 09/09/2018    Chronic bilateral low back pain without sciatica 10/07/2016   Chronic bilateral upper abdominal pain 10/07/2016   Primary osteoarthritis of both knees post bilateral hemiarthroplasty 10/07/2016   Candidosis of skin 04/17/2016   Cherry angioma 04/17/2016   Dry skin 04/17/2016   Dermal nevus of left forearm 04/17/2016   Multiple benign nevi 04/17/2016   Scar condition and fibrosis of skin 04/17/2016   Sebaceous hyperplasia 04/17/2016   Seborrheic keratoses 04/17/2016   Skin tags, multiple acquired 04/17/2016   Solar lentigo 04/17/2016   S/P laparoscopic fundoplication 0000000   Gastroesophageal reflux disease without esophagitis 11/06/2015   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   Primary osteoarthritis of right knee 11/21/2014   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

## 2021-06-27 NOTE — Patient Instructions (Signed)
Access Code: [place] URL: [email or web address] Date: 06/27/2021 Prepared by: Rudell Cobb  Exercises Active Straight Leg Raise with Quad Set - 2 x daily - 7 x weekly - 1 sets - 10 reps Hooklying Hamstring Stretch with Strap - 2 x daily - 7 x weekly - 1 sets - 3 reps - 30 sec hold Supine ITB Stretch with Strap - 2 x daily - 7 x weekly - 1 sets - 3 reps - 30 sec hold Hip Adductors and Hamstring Stretch with Strap - 2 x daily - 7 x weekly - 1 sets - 3 reps - 30 sec hold Supine Knee Flexion Wall Slide - 2 x daily - 7 x weekly - 1 sets - 10 reps Sidelying Hip Abduction - 2 x daily - 7 x weekly - 2-3 sets - 10 reps - 3-5 sec hold Seated Long Arc Quad - 3 x daily - 7 x weekly - 10 reps - 1 sets Heel Raises with Counter Support - 2 x daily - 7 x weekly - 1 sets - 10 reps Single Leg Stance with Support - 2 x daily - 7 x weekly - 1 sets - 3 reps - 10 seconds hold  Patient Education Scar Massage

## 2021-07-01 ENCOUNTER — Ambulatory Visit (INDEPENDENT_AMBULATORY_CARE_PROVIDER_SITE_OTHER): Payer: Medicare Other | Admitting: Rehabilitative and Restorative Service Providers"

## 2021-07-01 ENCOUNTER — Ambulatory Visit (INDEPENDENT_AMBULATORY_CARE_PROVIDER_SITE_OTHER): Payer: Medicare Other | Admitting: Osteopathic Medicine

## 2021-07-01 ENCOUNTER — Encounter: Payer: Self-pay | Admitting: Osteopathic Medicine

## 2021-07-01 ENCOUNTER — Other Ambulatory Visit: Payer: Self-pay

## 2021-07-01 VITALS — BP 131/71 | HR 64 | Temp 98.0°F | Wt 178.0 lb

## 2021-07-01 DIAGNOSIS — F330 Major depressive disorder, recurrent, mild: Secondary | ICD-10-CM | POA: Diagnosis not present

## 2021-07-01 DIAGNOSIS — M797 Fibromyalgia: Secondary | ICD-10-CM

## 2021-07-01 DIAGNOSIS — M25662 Stiffness of left knee, not elsewhere classified: Secondary | ICD-10-CM | POA: Diagnosis not present

## 2021-07-01 DIAGNOSIS — M7918 Myalgia, other site: Secondary | ICD-10-CM

## 2021-07-01 DIAGNOSIS — Z780 Asymptomatic menopausal state: Secondary | ICD-10-CM | POA: Diagnosis not present

## 2021-07-01 DIAGNOSIS — F410 Panic disorder [episodic paroxysmal anxiety] without agoraphobia: Secondary | ICD-10-CM | POA: Diagnosis not present

## 2021-07-01 DIAGNOSIS — R5382 Chronic fatigue, unspecified: Secondary | ICD-10-CM | POA: Diagnosis not present

## 2021-07-01 DIAGNOSIS — F411 Generalized anxiety disorder: Secondary | ICD-10-CM | POA: Diagnosis not present

## 2021-07-01 DIAGNOSIS — I480 Paroxysmal atrial fibrillation: Secondary | ICD-10-CM

## 2021-07-01 DIAGNOSIS — I10 Essential (primary) hypertension: Secondary | ICD-10-CM

## 2021-07-01 DIAGNOSIS — R7309 Other abnormal glucose: Secondary | ICD-10-CM

## 2021-07-01 DIAGNOSIS — R531 Weakness: Secondary | ICD-10-CM | POA: Diagnosis not present

## 2021-07-01 DIAGNOSIS — R29898 Other symptoms and signs involving the musculoskeletal system: Secondary | ICD-10-CM | POA: Diagnosis not present

## 2021-07-01 MED ORDER — MUPIROCIN CALCIUM 2 % NA OINT: 1.0000 "application " | TOPICAL_OINTMENT | Freq: Two times a day (BID) | NASAL | 3 refills | Status: DC

## 2021-07-01 MED ORDER — LOSARTAN POTASSIUM 50 MG PO TABS: 25.0000 mg | ORAL_TABLET | Freq: Every morning | ORAL | Status: DC

## 2021-07-01 NOTE — Progress Notes (Signed)
Melissa Rodgers is a 70 y.o. female who presents to  [place] at [facility]  today, 07/01/21, seeking care for the following:  Fatigue - longstanding problem, last documented discussion on this 09/13/21 which was her last visit with me. Last labs around surgery 04/2021, Hgb 11.7, CMP and BNP ok, TSH WNL 08/2020. On ROS- NO CP on exertion, no SOB, no headache, no weight fluctuations, on fever/night sweats, (+)occasional insomnia, no daytime somnolence, (+)body aches but this is stable, no rashes.  Nasal irritation few weeks, no painful sinuses, no bad drainage      ASSESSMENT & PLAN with other pertinent findings:  The primary encounter diagnosis was Chronic fatigue. Diagnoses of Paroxysmal atrial fibrillation (HCC), Generalized anxiety disorder with panic attacks, Musculoskeletal pain, Essential hypertension, Mild episode of recurrent major depressive disorder ([place]), Fibromyalgia, Postmenopausal, and Elevated hemoglobin A1c were also pertinent to this visit.   Note routed to cardiology  No red flags Labs, consider sleep study but I think would first try med adjustment (change BB to Bystolic, consider diltiazem daily vs prn)   Patient Instructions  Plan: Labs today HOLD Losartan for now. Can stay OFF this Rx if BP <140/90 Will discuss w/ cardiology - may change Metoprolol to Bystolic, or consider stopping beta blocker Rx in favor of something else like Diltiazem daily - will see what Dr. Bettina Gavia says!  Rx sent for nose - to [facility]  Will be in touch soon w/ lab results and with whatever Dr. Bettina Gavia and I discuss!    Orders Placed This Encounter  Procedures   CBC with Differential/Platelet   COMPLETE METABOLIC PANEL WITH GFR   Lipid panel   TSH   Hemoglobin A1c   Sedimentation rate   High sensitivity CRP   Urinalysis, Routine w reflex microscopic    Meds ordered this encounter  Medications   mupirocin nasal ointment (BACTROBAN) 2 %     Sig: Place 1 application into the nose 2 (two) times daily. Apply to both nares BID for 5 days.    Dispense:  30 g    Refill:  3   losartan (COZAAR) 50 MG tablet    Sig: Take 0.5 tablets (25 mg total) by mouth in the morning. HOLDING AS OF 07/01/21 SEE PROGRESS NOTES DR A     See below for relevant physical exam findings  See below for recent lab and imaging results reviewed  Medications, allergies, PMH, PSH, SocH, FamH reviewed below    Follow-up instructions: Return for RECHECK PENDING RESULTS / IF WORSE OR CHANGE.                                        Exam:  BP 131/71 (BP Location: Left Arm, Patient Position: Sitting, Cuff Size: Normal)   Pulse 64   Temp 98 F (36.7 C) (Oral)   Wt 178 lb 0.6 oz (80.8 kg)   BMI 31.54 kg/m  Constitutional: VS see above. General Appearance: alert, well-developed, well-nourished, NAD Neck: No masses, trachea midline.  Respiratory: Normal respiratory effort. no wheeze, no rhonchi, no rales Cardiovascular: S1/S2 normal, no murmur, no rub/gallop auscultated. RRR. No irreg/irreg today - sounds like SR Musculoskeletal: Gait normal. Symmetric and independent movement of all extremities Neurological: Normal balance/coordination. No tremor. Skin: warm, dry, intact.  Psychiatric: Normal judgment/insight. Normal mood and affect. Oriented x3.   Current Meds  Medication Sig  apixaban (ELIQUIS) 5 MG TABS tablet Take 5 mg by mouth 2 (two) times daily.   cetirizine (ZYRTEC) 10 MG tablet Take 10 mg by mouth daily.   Cholecalciferol (VITAMIN D-3) 1000 units CAPS Take 1,000 Units by mouth daily.   clonazePAM (KLONOPIN) 0.5 MG tablet Take 0.5 mg by mouth daily as needed for anxiety.   Cyanocobalamin (B-12 PO) Take 1 tablet by mouth daily.   diltiazem (CARDIZEM) 30 MG tablet Take 1 tablet (30 mg total) by mouth every 4 (four) hours as needed.   docusate sodium (COLACE) 100 MG capsule Take 1 capsule (100 mg total) by  mouth 2 (two) times daily.   flecainide (TAMBOCOR) 50 MG tablet TAKE 1 TABLET(50 MG) BY MOUTH TWICE DAILY   FLUoxetine (PROZAC) 10 MG tablet Take 10 mg by mouth daily.   gabapentin (NEURONTIN) 300 MG capsule Take 1 capsule (300 mg total) by mouth 2 (two) times daily.   HYDROcodone-acetaminophen (NORCO/VICODIN) 5-325 MG tablet Take 1 tablet by mouth every 6 (six) hours as needed for moderate pain.   metoprolol succinate (TOPROL-XL) 25 MG 24 hr tablet TAKE 1 TABLET(25 MG) BY MOUTH DAILY   mupirocin nasal ointment (BACTROBAN) 2 % Place 1 application into the nose 2 (two) times daily. Apply to both nares BID for 5 days.   [DISCONTINUED] losartan (COZAAR) 50 MG tablet Take 25 mg by mouth in the morning.    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   DDD (degenerative disc disease), cervical 12/03/2020   Severe pain of left shoulder 11/05/2020   Fibromyalgia 03/05/2020   Knee pain, acute 03/05/2020   Osteoarthritis 03/05/2020   Diverticulitis of sigmoid colon 08/01/2019   Generalized anxiety disorder with panic attacks 08/01/2019   Unspecified atrial fibrillation 06/18/2019   Major depressive disorder 09/09/2018   Dry eye syndrome of both eyes 09/09/2018   Microscopic hematuria 09/09/2018   Non-seasonal allergic rhinitis 09/09/2018   Hyperlipidemia 09/09/2018   Essential hypertension 09/09/2018   History of palpitations 09/09/2018   Chronic bilateral low back pain without sciatica 10/07/2016   Chronic bilateral upper abdominal pain 10/07/2016   Primary osteoarthritis of both knees post bilateral hemiarthroplasty 10/07/2016   Candidosis of skin 04/17/2016   Cherry angioma 04/17/2016   Dry skin 04/17/2016    Dermal nevus of left forearm 04/17/2016   Multiple benign nevi 04/17/2016   Scar condition and fibrosis of skin 04/17/2016   Sebaceous hyperplasia 04/17/2016   Seborrheic keratoses 04/17/2016   Skin tags, multiple acquired 04/17/2016   Solar lentigo 04/17/2016   S/P laparoscopic fundoplication 0000000   Gastroesophageal reflux disease without esophagitis 11/06/2015   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   Primary osteoarthritis of right knee 11/21/2014   Left knee DJD 03/27/2014   Palpitations 09/28/2013   BMI 30.0-30.9,adult 01/27/2013   Subarachnoid hemorrhage following injury 2010    Family History  Problem Relation Age of Onset   Diabetes Mother    Heart disease Mother    Alzheimer's disease Father        Symptom onset in late 71s   Atrial fibrillation Sister    Tremor Other        Significant maternal history of tremor; unknown regarding Parkinson's disease diagnoses   Colon cancer  Neg Hx    Esophageal cancer Neg Hx     Social History   Tobacco Use  Smoking Status Former   Types: Cigarettes   Quit date: 08/18/1983   Years since quitting: 37.8  Smokeless Tobacco Never    Past Surgical History:  Procedure Laterality Date   ESOPHAGOGASTRODUODENOSCOPY     possibly last one was 2017. In [place]   HERNIA REPAIR     MEDIAL PARTIAL KNEE REPLACEMENT Right 0000000   NISSEN FUNDOPLICATION  [id number]   OOPHORECTOMY     PARTIAL KNEE ARTHROPLASTY Left 04/05/2014   TOTAL KNEE REVISION Left 05/10/2021   Procedure: TOTAL KNEE REVISION;  Surgeon: Dorna Leitz, MD;  Location: [facility];  Service: Orthopedics;  Laterality: Left;    Immunization History  Administered Date(s) Administered   Fluad Quad(high Dose 65+) 08/29/2019   Influenza Whole 09/13/2014   Influenza, High Dose Seasonal PF 10/07/2016, 09/15/2017, 09/09/2018   Influenza,inj,Quad PF,6+ Mos 09/26/2015   Influenza,inj,quad, With Preservative 09/09/2013   Influenza-Unspecified 09/06/2020   PFIZER(Purple Top)SARS-COV-2  Vaccination 12/16/2019, 01/05/2020, 08/23/2020, 03/18/2021   Pneumococcal Conjugate-13 08/10/2015   Pneumococcal Polysaccharide-23 10/14/2013   Tdap 03/20/2015   Zoster, Live 01/27/2013    Recent Results (from the past 2160 hour(s))  Surgical pcr screen     Status: Abnormal   Collection Time: 04/29/21 11:23 AM   Specimen: Nasal Mucosa; Nasal Swab  Result Value Ref Range   MRSA, PCR NEGATIVE NEGATIVE   Staphylococcus aureus POSITIVE (A) NEGATIVE    Comment: (NOTE) The Xpert SA Assay (FDA approved for NASAL specimens in patients 45 years of age and older), is one component of a comprehensive surveillance program. It is not intended to diagnose infection nor to guide or monitor treatment. Performed at [facility], [place] [address], [place], [place] [address]   CBC WITH DIFFERENTIAL     Status: Abnormal   Collection Time: 04/29/21 11:23 AM  Result Value Ref Range   WBC 7.8 4.0 - 10.5 K/uL   RBC 4.97 3.87 - 5.11 MIL/uL   Hemoglobin 14.7 12.0 - 15.0 g/dL   HCT 46.2 (H) 36.0 - 46.0 %   MCV 93.0 80.0 - 100.0 fL   MCH 29.6 26.0 - 34.0 pg   MCHC 31.8 30.0 - 36.0 g/dL   RDW 13.2 11.5 - 15.5 %   Platelets 302 150 - 400 K/uL   nRBC 0.0 0.0 - 0.2 %   Neutrophils Relative % 53 %   Neutro Abs 4.2 1.7 - 7.7 K/uL   Lymphocytes Relative 34 %   Lymphs Abs 2.6 0.7 - 4.0 K/uL   Monocytes Relative 8 %   Monocytes Absolute 0.6 0.1 - 1.0 K/uL   Eosinophils Relative 3 %   Eosinophils Absolute 0.3 0.0 - 0.5 K/uL   Basophils Relative 1 %   Basophils Absolute 0.1 0.0 - 0.1 K/uL   Immature Granulocytes 1 %   Abs Immature Granulocytes 0.04 0.00 - 0.07 K/uL    Comment: Performed at [facility], [place] [address] [place], [place] [address]  Comprehensive metabolic panel     Status: None   Collection Time: 04/29/21 11:23 AM  Result Value Ref Range   Sodium 137 135 - 145 mmol/L   Potassium 3.9 3.5 - 5.1 mmol/L   Chloride 102 98 - 111 mmol/L   CO2 28 22 - 32  mmol/L   Glucose, Bld 95 70 - 99 mg/dL    Comment: Glucose reference range applies only to samples taken after  fasting for at least 8 hours.   BUN 14 8 - 23 mg/dL   Creatinine, Ser 0.60 0.44 - 1.00 mg/dL   Calcium 9.1 8.9 - 10.3 mg/dL   Total Protein 7.9 6.5 - 8.1 g/dL   Albumin 4.3 3.5 - 5.0 g/dL   AST 25 15 - 41 U/L   ALT 26 0 - 44 U/L   Alkaline Phosphatase 63 38 - 126 U/L   Total Bilirubin 0.7 0.3 - 1.2 mg/dL   GFR, Estimated >60 >60 mL/min    Comment: (NOTE) Calculated using the CKD-EPI Creatinine Equation (2021)    Anion gap 7 5 - 15    Comment: Performed at [facility], [place] [address], [place], [place] [address]  Protime-INR     Status: None   Collection Time: 04/29/21 11:23 AM  Result Value Ref Range   Prothrombin Time 15.1 11.4 - 15.2 seconds   INR 1.2 0.8 - 1.2    Comment: (NOTE) INR goal varies based on device and disease states. Performed at [facility], [place] [address] [place], [place] [address]   APTT     Status: Abnormal   Collection Time: 04/29/21 11:23 AM  Result Value Ref Range   aPTT 46 (H) 24 - 36 seconds    Comment:        IF BASELINE aPTT IS ELEVATED, SUGGEST PATIENT RISK ASSESSMENT BE USED TO DETERMINE APPROPRIATE ANTICOAGULANT THERAPY. Performed at [facility], [place] [address] [place], [place] [address]   Urinalysis, Routine w reflex microscopic Urine, Clean Catch     Status: Abnormal   Collection Time: 04/29/21 11:23 AM  Result Value Ref Range   Color, Urine STRAW (A) YELLOW   APPearance CLEAR CLEAR   Specific Gravity, Urine 1.005 1.005 - 1.030   pH 7.0 5.0 - 8.0   Glucose, UA NEGATIVE NEGATIVE mg/dL   Hgb urine dipstick SMALL (A) NEGATIVE   Bilirubin Urine NEGATIVE NEGATIVE   Ketones, ur NEGATIVE NEGATIVE mg/dL   Protein, ur NEGATIVE NEGATIVE mg/dL   Nitrite NEGATIVE NEGATIVE   Leukocytes,Ua NEGATIVE NEGATIVE   RBC / HPF 0-5 0 - 5 RBC/hpf   WBC, UA 0-5 0 - 5 WBC/hpf   Bacteria,  UA NONE SEEN NONE SEEN    Comment: Performed at [facility], [place] [address] [place], [place] [address]  Type and screen Order type and screen if day of surgery is less than 15 days from draw of preadmission visit or order morning of surgery if day of surgery is greater than 6 days from preadmission visit.     Status: None   Collection Time: 04/29/21 11:23 AM  Result Value Ref Range   ABO/RH(D) O POS    Antibody Screen NEG    Sample Expiration 05/13/2021,2359    Extend sample reason      NO TRANSFUSIONS OR PREGNANCY IN THE PAST 3 MONTHS Performed at [facility], [place] [address], [place], [place] [address]   Pro b natriuretic peptide (BNP)     Status: None   Collection Time: 05/02/21 11:14 AM  Result Value Ref Range   NT-Pro BNP 48 0 - 301 pg/mL    Comment: The following cut-points have been suggested for the use of proBNP for the diagnostic evaluation of heart failure (HF) in patients with acute dyspnea: Modality                     Age  Optimal Cut                            (years)            Point ------------------------------------------------------ Diagnosis (rule in HF)        <50            450 pg/mL                           50 - 75            900 pg/mL                               >75           1800 pg/mL Exclusion (rule out HF)  Age independent     300 pg/mL   ECHOCARDIOGRAM COMPLETE     Status: None   Collection Time: 05/03/21 11:48 AM  Result Value Ref Range   S' Lateral 2.50 cm   Single Plane A4C EF 53.0 %   Area-P 1/2 2.76 cm2   P 1/2 time 748 msec  SARS CORONAVIRUS 2 (TAT 6-24 HRS) Nasopharyngeal Nasopharyngeal Swab     Status: None   Collection Time: 05/07/21 10:45 AM   Specimen: Nasopharyngeal Swab  Result Value Ref Range   SARS Coronavirus 2 NEGATIVE NEGATIVE    Comment: (NOTE) SARS-CoV-2 target nucleic acids are NOT DETECTED.  The SARS-CoV-2 RNA is generally detectable in upper and lower respiratory  specimens during the acute phase of infection. Negative results do not preclude SARS-CoV-2 infection, do not rule out co-infections with other pathogens, and should not be used as the sole basis for treatment or other patient management decisions. Negative results must be combined with clinical observations, patient history, and epidemiological information. The expected result is Negative.  Fact Sheet for Patients: SugarRoll.be  Fact Sheet for Healthcare Providers: https://www.woods-mathews.com/  This test is not yet approved or cleared by the [place] FDA and  has been authorized for detection and/or diagnosis of SARS-CoV-2 by FDA under an Emergency Use Authorization (EUA). This EUA will remain  in effect (meaning this test can be used) for the duration of the COVID-19 declaration under Se ction 564(b)(1) of the Act, 21 U.S.C. section 360bbb-3(b)(1), unless the authorization is terminated or revoked sooner.  Performed at [facility], [place] [address], [place], [place] [address]   APTT     Status: None   Collection Time: 05/10/21  9:42 AM  Result Value Ref Range   aPTT 36 24 - 36 seconds    Comment: Performed at [facility], [place] [address], [place], [place] [address]  ABO/Rh     Status: None   Collection Time: 05/10/21  9:44 AM  Result Value Ref Range   ABO/RH(D)      O POS Performed at [facility], [place] [address] [place], [place] [address]   Aerobic/Anaerobic Culture w Gram Stain (surgical/deep wound)     Status: None   Collection Time: 05/10/21 12:03 PM   Specimen: Synovial, Left Knee; Body Fluid  Result Value Ref Range   Specimen Description      KNEE LEFT Performed at [place] [address] [place],  [address]    Special Requests      NONE Performed at [facility], [place] [address], [place], [place] [address]    Gram Stain       RARE WBC PRESENT, PREDOMINANTLY MONONUCLEAR NO ORGANISMS SEEN    Culture      No growth aerobically or anaerobically. Performed at [facility], [place] [address] [place], [place] [address]    Report Status 05/15/2021 FINAL   CBC     Status: Abnormal   Collection Time: 05/11/21  3:42 AM  Result Value Ref Range   WBC 15.1 (H) 4.0 - 10.5 K/uL   RBC 4.04 3.87 - 5.11 MIL/uL   Hemoglobin 11.7 (L) 12.0 - 15.0 g/dL   HCT 37.1 36.0 - 46.0 %   MCV 91.8 80.0 - 100.0 fL   MCH 29.0 26.0 - 34.0 pg   MCHC 31.5 30.0 - 36.0 g/dL   RDW 13.4 11.5 - 15.5 %   Platelets 239 150 - 400 K/uL   nRBC 0.0 0.0 - 0.2 %    Comment: Performed at [facility], [place] [address], [place],  [address]    No results found.     All questions at time of visit were answered - patient instructed to contact office with any additional concerns or updates. ER/RTC precautions were reviewed with the patient as applicable.   Please note: manual typing as well as voice recognition software may have been used to produce this document - typos may escape review. Please contact Dr. Sheppard Coil for any needed clarifications.

## 2021-07-01 NOTE — Patient Instructions (Addendum)
Plan: Labs today HOLD Losartan for now. Can stay OFF this Rx if BP <140/90 Will discuss w/ cardiology - may change Metoprolol to Bystolic, or consider stopping beta blocker Rx in favor of something else like Diltiazem daily - will see what Dr. Bettina Gavia says!  Rx sent for nose - to [facility]  Will be in touch soon w/ lab results and with whatever Dr. Bettina Gavia and I discuss!

## 2021-07-01 NOTE — Therapy (Signed)
[place], [place], [address] Phone: [phone]   Fax:  [phone]  Physical Therapy Treatment  Patient Details  Name: Melissa Rodgers MRN: [id number] Date of Birth: [birth year] Referring Provider (PT): Dr Dorna Leitz   Encounter Date: 07/01/2021   PT End of Session - 07/01/21 1348     Visit Number 9    Number of Visits 24    Date for PT Re-Evaluation 08/21/21    PT Start Time 1347    PT Stop Time 1430    PT Time Calculation (min) 43 min    Activity Tolerance Patient tolerated treatment well    Behavior During Therapy [facility] for tasks assessed/performed             Past Medical History:  Diagnosis Date   Anemia    Atrial fibrillation 06/18/2019   BMI 30.0-30.9,adult 01/27/2013   Candidosis of skin 04/17/2016   Formatting of this note might be different from the original. 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 01/29/15:  24 Hour Holter Monitor CONCLUSIONS: 1.  Overall normal 24-hour Holter monitor recording, with rare, brief,  asymptomatic nonsustained supraventricular tachycardia as described.  2. No cardiac rhythm explanation for patient's symptoms.       Primary osteoarthritis of both knees 10/07/2016   Formatting of this note might be different from the original. 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   Primary osteoarthritis of right knee 11/21/2014   S/P laparoscopic fundoplication [id number]   Scar condition and fibrosis of skin 04/17/2016   Formatting of this note might be different from the original. lower abdomen   Sebaceous hyperplasia 04/17/2016   Seborrheic keratoses 04/17/2016   Skin tags, multiple acquired 04/17/2016   Solar lentigo 04/17/2016   Subarachnoid hemorrhage following injury 2010   Fall from horse; brief hospitalization    Past Surgical History:  Procedure Laterality Date   ESOPHAGOGASTRODUODENOSCOPY     possibly last one was 2017. In [place]   HERNIA REPAIR     MEDIAL PARTIAL KNEE REPLACEMENT Right 0000000   NISSEN FUNDOPLICATION  [id number]   OOPHORECTOMY     PARTIAL KNEE ARTHROPLASTY Left 04/05/2014   TOTAL KNEE REVISION Left 05/10/2021   Procedure: TOTAL KNEE REVISION;  Surgeon: Dorna Leitz, MD;  Location: [facility];  Service: Orthopedics;  Laterality: Left;    There were no vitals filed for this visit.   Subjective Assessment - 07/01/21 1346     Subjective The patient reports she is still weak in the L leg.  Pertinent History Rt partial knee replacement 2016; a-fib; HTN; mild anxiety/depression; arthritis; fundoplication 0000000; shoulder pain; neck pain    Patient Stated Goals improve flexibility and recovery from knee surgery    Currently in Pain? Yes    Pain Score 3     Pain Location Knee    Pain Orientation Left;Posterior    Pain Descriptors / Indicators Tightness;Aching;Sore    Pain Type Acute pain;Surgical pain    Pain Onset More than a month ago    Pain Frequency  Intermittent    Aggravating Factors  varies    Pain Relieving Factors meds, ice                [facility] PT Assessment - 07/01/21 1349       Assessment   Medical Diagnosis Lt TKA    Referring Provider (PT) Dr Dorna Leitz    Onset Date/Surgical Date 05/10/21    Hand Dominance Left    Next MD Visit 07/15/21      AROM   Left Knee Extension 2    Left Knee Flexion 116                           [facility] Adult PT Treatment/Exercise - 07/01/21 1349       Ambulation/Gait   Ambulation/Gait Yes    Ambulation/Gait Assistance 7: Independent    Gait Comments gait with cues on equal stride      Exercises   Exercises Knee/Hip      Knee/Hip Exercises: Stretches   Active Hamstring Stretch Left;1 rep;30 seconds    Other Knee/Hip Stretches hip adductor stretch seated      Knee/Hip Exercises: Aerobic   Recumbent Bike full revolutions on bike x 5 minutes      Knee/Hip Exercises: Standing   Hip Flexion Stengthening;Right;Left;10 reps    Hip Flexion Limitations 3 sets marching 20 feet    Forward Lunges Right;Left    Forward Lunges Limitations static holds in lunge with contralaterla trunk rotation    Abduction Limitations sidestepping    Extension Limitations backwards walking    Lateral Step Up Left   12 reps   Lateral Step Up Limitations lateral stepping off 4" surface    Forward Step Up Left   12 reps; no rails   Forward Step Up Limitations 6" step    Other Standing Knee Exercises backwards walking x 20 feet x 3 reps      Knee/Hip Exercises: Seated   Long Arc Quad Left;1 set;10 reps    Other Seated Knee/Hip Exercises sit to stand with L knee increased flexion to WB to the L      Knee/Hip Exercises: Supine   Bridges Strengthening;Both;10 reps    Straight Leg Raises AROM;Strengthening;Left;10 reps      Knee/Hip Exercises: Sidelying   Hip ABduction Strengthening;Left;1 set;10 reps    Hip ABduction Limitations 2 lb ankle weight      Knee/Hip Exercises: Prone    Hamstring Curl 10 reps    Hamstring Curl Limitations with isometric holds to relax musculature    Straight Leg Raises Strengthening;Left;10 reps      Manual Therapy   Manual Therapy Soft tissue mobilization    Manual therapy comments scar tissue and STM to reduce muscle guarding and pain    Soft tissue mobilization STM  L medial hamstrings, gastroc, gracilis                    PT  Education - 07/01/21 1427     Education Details HEP modified    Person(s) Educated Patient    Methods Explanation;Demonstration;Handout    Comprehension Verbalized understanding;Returned demonstration              PT Short Term Goals - 07/01/21 1659       PT SHORT TERM GOAL #1   Title Independent in inital HEP    Time 6    Period Weeks    Status Achieved    Target Date 07/10/21      PT SHORT TERM GOAL #2   Title normal gait pattern with cane to no assistive device    Time 6    Period Weeks    Status Achieved    Target Date 07/10/21      PT SHORT TERM GOAL #3   Title AROM Lt knee -3 deg extension to 100 deg flexion    Baseline 4 to 112 degrees on 7/26    Time 6    Period Weeks    Status Achieved    Target Date 07/10/21               PT Long Term Goals - 05/29/21 1714       PT LONG TERM GOAL #1   Title The patient will be indep with HEP including community aquatic pool program as appropriate    Time 12    Period Weeks    Status New    Target Date 08/21/21      PT LONG TERM GOAL #2   Title 5/5 strength Lt LE    Baseline -    Time 12    Period Weeks    Status New    Target Date 08/21/21      PT LONG TERM GOAL #3   Title Patient reports return to normal functional activities including walking for 20 min without increased symptoms    Baseline -    Time 12    Period Weeks    Status New    Target Date 08/21/21      PT LONG TERM GOAL #4   Title AROM Lt knee 0 deg extension to 110 deg flexion    Baseline -    Time 12    Period Weeks    Status New     Target Date 08/21/21      PT LONG TERM GOAL #5   Title Improve functional limitation score to 53    Time 12    Period Weeks    Status New    Target Date 08/21/21                   Plan - 07/01/21 1657     Clinical Impression Statement The patient is continuing to progress with ROM meeting STGs.  She is also progressing with gait mechanics and demonstrates more equal weight shift.  PT to begin 1x/week next week at patinet's request as she is progressing well.    PT Treatment/Interventions ADLs/Self Care Home Management;Aquatic Therapy;Cryotherapy;Electrical Stimulation;Iontophoresis '4mg'$ /ml Dexamethasone;Moist Heat;Ultrasound;Gait training;Stair training;Functional mobility training;Therapeutic activities;Therapeutic exercise;Balance training;Neuromuscular re-education;Patient/family education;Manual techniques;Passive range of motion;Dry needling;Taping;Vasopneumatic Device    PT Next Visit Plan progress with ROM and strength Lt LE; gait training; retape as needed.  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/10/2021   DDD (degenerative disc disease), cervical 12/03/2020   Severe pain of left shoulder 11/05/2020   Fibromyalgia 03/05/2020   Knee pain, acute 03/05/2020   Osteoarthritis 03/05/2020   Diverticulitis of sigmoid colon 08/01/2019   Generalized anxiety disorder with panic attacks 08/01/2019   Unspecified atrial  fibrillation 06/18/2019   Major depressive disorder 09/09/2018   Dry eye syndrome of both eyes 09/09/2018   Microscopic hematuria 09/09/2018   Non-seasonal allergic rhinitis 09/09/2018   Hyperlipidemia 09/09/2018   Essential hypertension 09/09/2018   History of palpitations 09/09/2018   Chronic bilateral low back pain without sciatica 10/07/2016   Chronic bilateral upper abdominal pain 10/07/2016   Primary osteoarthritis of both knees post bilateral hemiarthroplasty 10/07/2016   Candidosis of skin 04/17/2016   Cherry angioma 04/17/2016   Dry skin 04/17/2016   Dermal nevus of left forearm 04/17/2016   Multiple benign nevi 04/17/2016   Scar condition and fibrosis of skin 04/17/2016   Sebaceous hyperplasia 04/17/2016   Seborrheic keratoses 04/17/2016   Skin tags, multiple acquired 04/17/2016   Solar lentigo 04/17/2016   S/P laparoscopic fundoplication 0000000   Gastroesophageal reflux disease without esophagitis 11/06/2015   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   Primary osteoarthritis of right knee 11/21/2014   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

## 2021-07-01 NOTE — Patient Instructions (Signed)
Access Code: [email or web address] URL: [email or web address] Date: 07/01/2021 Prepared by: Rudell Cobb  Exercises Hooklying Single Knee to Chest Stretch - 2 x daily - 7 x weekly - 1 sets - 10 reps Active Straight Leg Raise with Quad Set - 2 x daily - 7 x weekly - 1 sets - 5 reps Seated Hamstring Stretch with Chair - 2 x daily - 7 x weekly - 1 sets - 2 reps - 30 seconds hold Seated Hip Adductor Stretch - 2 x daily - 7 x weekly - 1 sets - 2 reps - 30 seconds hold Seated Long Arc Quad - 3 x daily - 7 x weekly - 10 reps - 1 sets Heel Raises with Counter Support - 2 x daily - 7 x weekly - 1 sets - 10 reps Single Leg Stance with Support - 2 x daily - 7 x weekly - 1 sets - 3 reps - 10 seconds hold Sidestepping - 2 x daily - 7 x weekly - 1 sets - 10 reps  Patient Education Scar Massage

## 2021-07-02 ENCOUNTER — Encounter: Payer: Medicare Other | Admitting: Physical Therapy

## 2021-07-02 LAB — COMPLETE METABOLIC PANEL WITH GFR
AG Ratio: 1.4 (calc) (ref 1.0–2.5)
ALT: 15 U/L (ref 6–29)
AST: 17 U/L (ref 10–35)
Albumin: 4.2 g/dL (ref 3.6–5.1)
Alkaline phosphatase (APISO): 74 U/L (ref 37–153)
BUN/Creatinine Ratio: 28 (calc) — ABNORMAL HIGH (ref 6–22)
BUN: 14 mg/dL (ref 7–25)
CO2: 28 mmol/L (ref 20–32)
Calcium: 9.4 mg/dL (ref 8.6–10.4)
Chloride: 105 mmol/L (ref 98–110)
Creat: 0.5 mg/dL — ABNORMAL LOW (ref 0.60–1.00)
Globulin: 3 g/dL (calc) (ref 1.9–3.7)
Glucose, Bld: 74 mg/dL (ref 65–99)
Potassium: 4.4 mmol/L (ref 3.5–5.3)
Sodium: 139 mmol/L (ref 135–146)
Total Bilirubin: 0.4 mg/dL (ref 0.2–1.2)
Total Protein: 7.2 g/dL (ref 6.1–8.1)
eGFR: 100 mL/min/{1.73_m2} (ref >=60)

## 2021-07-02 LAB — HEMOGLOBIN A1C
Hgb A1c MFr Bld: 5.6 % of total Hgb (ref <5.7)
Mean Plasma Glucose: 114 mg/dL
eAG (mmol/L): 6.3 mmol/L

## 2021-07-02 LAB — SEDIMENTATION RATE: Sed Rate: 17 mm/h (ref 0–30)

## 2021-07-02 LAB — URINALYSIS, ROUTINE W REFLEX MICROSCOPIC
Bacteria, UA: NONE SEEN /HPF
Bilirubin Urine: NEGATIVE
Glucose, UA: NEGATIVE
Hyaline Cast: NONE SEEN /LPF
Ketones, ur: NEGATIVE
Nitrite: NEGATIVE
Protein, ur: NEGATIVE
RBC / HPF: NONE SEEN /HPF (ref 0–2)
Specific Gravity, Urine: 1.016 (ref 1.001–1.035)
Squamous Epithelial / HPF: NONE SEEN /HPF (ref <=5)
WBC, UA: NONE SEEN /HPF (ref 0–5)
pH: 5.5 (ref 5.0–8.0)

## 2021-07-02 LAB — LIPID PANEL
Cholesterol: 204 mg/dL — ABNORMAL HIGH (ref <200)
HDL: 42 mg/dL — ABNORMAL LOW (ref >=50)
LDL Cholesterol (Calc): 111 mg/dL (calc) — ABNORMAL HIGH
Non-HDL Cholesterol (Calc): 162 mg/dL (calc) — ABNORMAL HIGH (ref <130)
Total CHOL/HDL Ratio: 4.9 (calc) (ref <5.0)
Triglycerides: 356 mg/dL — ABNORMAL HIGH (ref <150)

## 2021-07-02 LAB — CBC WITH DIFFERENTIAL/PLATELET
Absolute Monocytes: 525 cells/uL (ref 200–950)
Basophils Absolute: 49 cells/uL (ref 0–200)
Basophils Relative: 0.8 %
Eosinophils Absolute: 207 cells/uL (ref 15–500)
Eosinophils Relative: 3.4 %
HCT: 43.1 % (ref 35.0–45.0)
Hemoglobin: 13.6 g/dL (ref 11.7–15.5)
Lymphs Abs: 1909 cells/uL (ref 850–3900)
MCH: 28.5 pg (ref 27.0–33.0)
MCHC: 31.6 g/dL — ABNORMAL LOW (ref 32.0–36.0)
MCV: 90.2 fL (ref 80.0–100.0)
MPV: 9.9 fL (ref 7.5–12.5)
Monocytes Relative: 8.6 %
Neutro Abs: 3410 cells/uL (ref 1500–7800)
Neutrophils Relative %: 55.9 %
Platelets: 362 10*3/uL (ref 140–400)
RBC: 4.78 10*6/uL (ref 3.80–5.10)
RDW: 12.8 % (ref 11.0–15.0)
Total Lymphocyte: 31.3 %
WBC: 6.1 10*3/uL (ref 3.8–10.8)

## 2021-07-02 LAB — TSH: TSH: 2.16 mIU/L (ref 0.40–4.50)

## 2021-07-02 LAB — MICROSCOPIC MESSAGE

## 2021-07-02 LAB — HIGH SENSITIVITY CRP: hs-CRP: 3.1 mg/L — ABNORMAL HIGH

## 2021-07-04 ENCOUNTER — Encounter: Payer: Medicare Other | Admitting: Physical Therapy

## 2021-07-05 ENCOUNTER — Other Ambulatory Visit: Payer: Self-pay

## 2021-07-05 ENCOUNTER — Ambulatory Visit (INDEPENDENT_AMBULATORY_CARE_PROVIDER_SITE_OTHER): Payer: Medicare Other | Admitting: Rehabilitative and Restorative Service Providers"

## 2021-07-05 DIAGNOSIS — M25662 Stiffness of left knee, not elsewhere classified: Secondary | ICD-10-CM

## 2021-07-05 DIAGNOSIS — R29898 Other symptoms and signs involving the musculoskeletal system: Secondary | ICD-10-CM

## 2021-07-05 DIAGNOSIS — R531 Weakness: Secondary | ICD-10-CM | POA: Diagnosis not present

## 2021-07-05 NOTE — Therapy (Signed)
[place], [place], [address] Phone: [phone]   Fax:  [phone]  Physical Therapy Treatment and 10th visit progress note  Patient Details  Name: Melissa Rodgers MRN: [id number] Date of Birth: 02/22/1951 Referring Provider (PT): Dr Dorna Leitz  Physical Therapy Progress Note   Dates of Reporting Period:05/29/21 to 07/05/21   Objective Measurements: ROM knee-- has met STGs with significant improvement in ROM, gait, stair negotiatoin and functional strength.  Reason Skilled Services are Required: continue progressing to LTGs emphasizing functional strength   Thank you for the referral of this patient.   Encounter Date: 07/05/2021   PT End of Session - 07/05/21 0939     Visit Number 10    Number of Visits 24    Date for PT Re-Evaluation 08/21/21    PT Start Time 0934    PT Stop Time 1015    PT Time Calculation (min) 41 min    Activity Tolerance Patient tolerated treatment well    Behavior During Therapy [facility] for tasks assessed/performed             Past Medical History:  Diagnosis Date   Anemia    Atrial fibrillation 06/18/2019   BMI 30.0-30.9,adult 01/27/2013   Candidosis of skin 04/17/2016   Formatting of this note might be different from the original. 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 01/29/15:  24 Hour Holter Monitor CONCLUSIONS: 1. Overall normal 24-hour Holter monitor recording, with rare, brief,  asymptomatic nonsustained supraventricular tachycardia as described.  2. No cardiac rhythm explanation for patient's symptoms.       Primary osteoarthritis of both knees 10/07/2016   Formatting of this note might be different from the original. 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   Primary osteoarthritis of right knee 11/21/2014   S/P laparoscopic fundoplication 7/41/6384   Scar condition and fibrosis of skin 04/17/2016   Formatting of this note might be different from the original. lower abdomen   Sebaceous hyperplasia 04/17/2016   Seborrheic keratoses 04/17/2016   Skin tags, multiple acquired 04/17/2016   Solar lentigo 04/17/2016   Subarachnoid hemorrhage following injury 2010   Fall from horse; brief hospitalization    Past Surgical History:  Procedure Laterality Date   ESOPHAGOGASTRODUODENOSCOPY     possibly last one was 2017. In [place]   HERNIA REPAIR     MEDIAL PARTIAL KNEE REPLACEMENT Right 53/64/6803   NISSEN FUNDOPLICATION  21/22/4825   OOPHORECTOMY     PARTIAL KNEE ARTHROPLASTY Left 04/05/2014  TOTAL KNEE REVISION Left 05/10/2021   Procedure: TOTAL KNEE REVISION;  Surgeon: Dorna Leitz, MD;  Location: [facility];  Service: Orthopedics;  Laterality: Left;    There were no vitals filed for this visit.   Subjective Assessment - 07/05/21 0933     Subjective The patient reports she is tight this morning. She is tolerating HEP well.  Descending steps and getting into the car are most  limiting activities.    Pertinent History Rt partial knee replacement 2016; a-fib; HTN; mild anxiety/depression; arthritis; fundoplication 7116; shoulder pain; neck pain    Patient Stated Goals improve flexibility and recovery from knee surgery    Currently in Pain? Yes    Pain Location Knee    Pain Orientation Left;Posterior    Pain Descriptors / Indicators Tightness    Pain Type Surgical pain;Acute pain    Pain Onset More than a month ago    Pain Frequency Intermittent    Aggravating Factors  varies    Pain Relieving Factors meds, ice                [facility] PT Assessment - 07/05/21 0940       Assessment   Medical Diagnosis Lt TKA    Referring Provider (PT) Dr Dorna Leitz    Onset Date/Surgical Date 05/10/21    Hand Dominance Left                           [facility] Adult PT Treatment/Exercise - 07/05/21 0941       Ambulation/Gait   Ambulation/Gait Yes    Ambulation/Gait Assistance 7: Independent    Stairs Yes    Stairs Assistance 6: Modified independent (Device/Increase time)    Stair Management Technique Alternating pattern;Step to pattern;One rail Right;One rail Left    Number of Stairs 14    Gait Comments gait iwth cues on bilat heel strike and equal weight shifting      Exercises   Exercises Knee/Hip      Knee/Hip Exercises: Stretches   Active Hamstring Stretch Left;1 rep;60 seconds    Gastroc Stretch Right;Left;1 rep;30 seconds    Other Knee/Hip Stretches distraction at joint line hanging LE off elevated mat with 4 lbs.      Knee/Hip Exercises: Aerobic   Recumbent Bike full revolutions on bike x 5 minutes      Knee/Hip Exercises: Standing   Knee Flexion Strengthening;Left;10 reps    Hip Flexion Stengthening;Right;Left;10 reps    Hip Flexion Limitations 3 sets marching 20 feet    Forward Step Up Left;10 reps;Step Height: 6";Hand Hold: 2    Step Down Right;10 reps    Step Down Limitations single leg stance on L off 1" foam    Functional  Squat 10 reps    Functional Squat Limitations mini squats    SLS single leg stance L side    SLS with Vectors reaching anteriorly      Knee/Hip Exercises: Seated   Other Seated Knee/Hip Exercises gentle hanging of L LE on elevated mat with small range flexion/extension      Knee/Hip Exercises: Supine   Bridges Strengthening;Both;10 reps    Single Leg Bridge Strengthening;Left;10 reps    Straight Leg Raise with External Rotation Strengthening;Left;10 reps    Patellar Mobs patellarl mobilization med/lat and sup/inferior      Knee/Hip Exercises: Prone   Hamstring Curl 10 reps    Hamstring Curl Limitations with isometric holds to relax musculature  Straight Leg Raises Strengthening;Left;10 reps      Manual Therapy   Manual Therapy Joint mobilization;Soft tissue mobilization    Manual therapy comments patellar mobilization (see above)    Joint Mobilization knee a/p grade II mobs                    PT Education - 07/05/21 1237     Education Details modified by adding mini squats (removed supine march) to progress strengthening    Person(s) Educated Patient    Methods Explanation;Demonstration;Handout    Comprehension Verbalized understanding;Returned demonstration              PT Short Term Goals - 07/01/21 1659       PT SHORT TERM GOAL #1   Title Independent in inital HEP    Time 6    Period Weeks    Status Achieved    Target Date 07/10/21      PT SHORT TERM GOAL #2   Title normal gait pattern with cane to no assistive device    Time 6    Period Weeks    Status Achieved    Target Date 07/10/21      PT SHORT TERM GOAL #3   Title AROM Lt knee -3 deg extension to 100 deg flexion    Baseline 4 to 112 degrees on 7/26    Time 6    Period Weeks    Status Achieved    Target Date 07/10/21               PT Long Term Goals - 05/29/21 1714       PT LONG TERM GOAL #1   Title The patient will be indep with HEP including community aquatic pool  program as appropriate    Time 12    Period Weeks    Status New    Target Date 08/21/21      PT LONG TERM GOAL #2   Title 5/5 strength Lt LE    Baseline -    Time 12    Period Weeks    Status New    Target Date 08/21/21      PT LONG TERM GOAL #3   Title Patient reports return to normal functional activities including walking for 20 min without increased symptoms    Baseline -    Time 12    Period Weeks    Status New    Target Date 08/21/21      PT LONG TERM GOAL #4   Title AROM Lt knee 0 deg extension to 110 deg flexion    Baseline -    Time 12    Period Weeks    Status New    Target Date 08/21/21      PT LONG TERM GOAL #5   Title Improve functional limitation score to 53    Time 12    Period Weeks    Status New    Target Date 08/21/21                   Plan - 07/05/21 1018     Clinical Impression Statement The patient has met all STGs.  She is continuing to progress to LTGs.  She requested reduced frequency to 1x/week.  PT has modified schedule at this time and will assess ability to continue progressing with dec'd frequency-- patient to perform greater walking/ther ex in home.  continue working to [employer].    PT Treatment/Interventions ADLs/Self Care  Home Management;Aquatic Therapy;Cryotherapy;Electrical Stimulation;Iontophoresis 4mg /ml Dexamethasone;Moist Heat;Ultrasound;Gait training;Stair training;Functional mobility training;Therapeutic activities;Therapeutic exercise;Balance training;Neuromuscular re-education;Patient/family education;Manual techniques;Passive range of motion;Dry needling;Taping;Vasopneumatic Device    PT Next Visit Plan progress with ROM and strength Lt LE; gait training; retape as needed.  Scar tissue massage/ STM for myofascial tightness    PT Home Exercise Plan [place] and Agree with Plan of Care Patient             Patient will benefit from skilled therapeutic intervention in order to improve the following deficits  and impairments:  Abnormal gait, Decreased range of motion, Difficulty walking, Pain, Decreased activity tolerance, Decreased balance, Impaired flexibility, Decreased mobility, Decreased strength, Increased edema, Postural dysfunction  Visit Diagnosis: Stiffness of left knee  Weakness generalized  Other symptoms and signs involving the musculoskeletal system     Problem List Patient Active Problem List   Diagnosis Date Noted   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   DDD (degenerative disc disease), cervical 12/03/2020   Severe pain of left shoulder 11/05/2020   Fibromyalgia 03/05/2020   Knee pain, acute 03/05/2020   Osteoarthritis 03/05/2020   Diverticulitis of sigmoid colon 08/01/2019   Generalized anxiety disorder with panic attacks 08/01/2019   Unspecified atrial fibrillation 06/18/2019   Major depressive disorder 09/09/2018   Dry eye syndrome of both eyes 09/09/2018   Microscopic hematuria 09/09/2018   Non-seasonal allergic rhinitis 09/09/2018   Hyperlipidemia 09/09/2018   Essential hypertension 09/09/2018   History of palpitations 09/09/2018   Chronic bilateral low back pain without sciatica 10/07/2016   Chronic bilateral upper abdominal pain 10/07/2016   Primary osteoarthritis of both knees post bilateral hemiarthroplasty 10/07/2016   Candidosis of skin 04/17/2016   Cherry angioma 04/17/2016   Dry skin 04/17/2016   Dermal nevus of left forearm 04/17/2016   Multiple benign nevi 04/17/2016   Scar condition and fibrosis of skin 04/17/2016   Sebaceous hyperplasia 04/17/2016   Seborrheic keratoses 04/17/2016   Skin tags, multiple acquired 04/17/2016   Solar lentigo 04/17/2016   S/P laparoscopic fundoplication 72/90/2111   Gastroesophageal reflux disease without esophagitis 11/06/2015   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   Primary osteoarthritis of right knee 11/21/2014   Left  knee DJD 03/27/2014   Palpitations 09/28/2013   BMI 30.0-30.9,adult 01/27/2013   Subarachnoid hemorrhage following injury 2010   Thank you for the referral of this patient. Rudell Cobb, MPT   [place] 07/05/2021, 12:39 PM  [facility] [place], [place], [address] Phone: [phone]   Fax:  [phone]  Name: Eugina Row MRN: [id number] Date of Birth: Jun 30, 1951

## 2021-07-09 ENCOUNTER — Encounter: Payer: Medicare Other | Admitting: Physical Therapy

## 2021-07-10 ENCOUNTER — Ambulatory Visit (INDEPENDENT_AMBULATORY_CARE_PROVIDER_SITE_OTHER): Payer: Medicare Other

## 2021-07-10 ENCOUNTER — Other Ambulatory Visit: Payer: Self-pay

## 2021-07-10 DIAGNOSIS — Z0000 Encounter for general adult medical examination without abnormal findings: Secondary | ICD-10-CM | POA: Diagnosis not present

## 2021-07-10 DIAGNOSIS — Z1231 Encounter for screening mammogram for malignant neoplasm of breast: Secondary | ICD-10-CM | POA: Diagnosis not present

## 2021-07-10 DIAGNOSIS — Z780 Asymptomatic menopausal state: Secondary | ICD-10-CM | POA: Diagnosis not present

## 2021-07-11 ENCOUNTER — Ambulatory Visit (INDEPENDENT_AMBULATORY_CARE_PROVIDER_SITE_OTHER): Payer: Medicare Other | Admitting: Physical Therapy

## 2021-07-11 ENCOUNTER — Encounter: Payer: Self-pay | Admitting: Physical Therapy

## 2021-07-11 DIAGNOSIS — R269 Unspecified abnormalities of gait and mobility: Secondary | ICD-10-CM | POA: Diagnosis not present

## 2021-07-11 DIAGNOSIS — M25662 Stiffness of left knee, not elsewhere classified: Secondary | ICD-10-CM | POA: Diagnosis not present

## 2021-07-11 DIAGNOSIS — R531 Weakness: Secondary | ICD-10-CM

## 2021-07-11 DIAGNOSIS — R29898 Other symptoms and signs involving the musculoskeletal system: Secondary | ICD-10-CM | POA: Diagnosis not present

## 2021-07-11 NOTE — Therapy (Signed)
[place], [place], [address] Phone: [phone]   Fax:  [phone]  Physical Therapy Treatment  Patient Details  Name: Melissa Rodgers MRN: [id number] Date of Birth: [birth year] Referring Provider (PT): Dr Dorna Leitz   Encounter Date: 07/11/2021   PT End of Session - 07/11/21 1455     Visit Number 11    Number of Visits 24    Date for PT Re-Evaluation 08/21/21    PT Start Time 1448    PT Stop Time 1526    PT Time Calculation (min) 38 min    Activity Tolerance Patient tolerated treatment well    Behavior During Therapy [facility] for tasks assessed/performed             Past Medical History:  Diagnosis Date   Anemia    Atrial fibrillation 06/18/2019   BMI 30.0-30.9,adult 01/27/2013   Candidosis of skin 04/17/2016   Formatting of this note might be different from the original. 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 01/29/15:  24 Hour Holter Monitor CONCLUSIONS: 1.  Overall normal 24-hour Holter monitor recording, with rare, brief,  asymptomatic nonsustained supraventricular tachycardia as described.  2. No cardiac rhythm explanation for patient's symptoms.       Primary osteoarthritis of both knees 10/07/2016   Formatting of this note might be different from the original. 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   Primary osteoarthritis of right knee 11/21/2014   S/P laparoscopic fundoplication 7/42/5956   Scar condition and fibrosis of skin 04/17/2016   Formatting of this note might be different from the original. lower abdomen   Sebaceous hyperplasia 04/17/2016   Seborrheic keratoses 04/17/2016   Skin tags, multiple acquired 04/17/2016   Solar lentigo 04/17/2016   Subarachnoid hemorrhage following injury 2010   Fall from horse; brief hospitalization    Past Surgical History:  Procedure Laterality Date   ESOPHAGOGASTRODUODENOSCOPY     possibly last one was 2017. In [place]   HERNIA REPAIR     MEDIAL PARTIAL KNEE REPLACEMENT Right 38/75/6433   NISSEN FUNDOPLICATION  29/51/8841   OOPHORECTOMY     PARTIAL KNEE ARTHROPLASTY Left 04/05/2014   TOTAL KNEE REVISION Left 05/10/2021   Procedure: TOTAL KNEE REVISION;  Surgeon: Dorna Leitz, MD;  Location: [facility];  Service: Orthopedics;  Laterality: Left;    There were no vitals filed for this visit.   Subjective Assessment - 07/11/21 1452     Subjective Pt reports that she took a 30 min walk with her  sister, but also did 26 steps (x2) to go to appts yesterday.  Last night was sore and achy at bedtime.    Pertinent History Rt partial knee replacement 2016; a-fib; HTN; mild anxiety/depression; arthritis; fundoplication 6812; shoulder pain; neck pain    Patient Stated Goals improve flexibility and recovery from knee surgery    Currently in Pain? Yes    Pain Score 3     Pain Location Knee    Pain Orientation Left    Pain Descriptors / Indicators Tightness;Sore    Aggravating  Factors  varies    Pain Relieving Factors meds, ice                [facility] PT Assessment - 07/11/21 0001       Assessment   Medical Diagnosis Lt TKA    Referring Provider (PT) Dr Dorna Leitz    Onset Date/Surgical Date 05/10/21    Hand Dominance Left    Next MD Visit 07/15/21    Prior Therapy here for knee and shoulder      Observation/Other Assessments   Focus on Therapeutic Outcomes (FOTO)  57 functional status      AROM   Left Knee Extension Lacking 2   Left Knee Flexion 123      Strength   Left Hip Flexion 5/5    Left Hip Extension 4+/5    Left Hip ABduction 5/5    Left Knee Flexion 4+/5      Flexibility   Quadriceps Lt 92                [facility] Adult PT Treatment/Exercise - 07/11/21 0001       Knee/Hip Exercises: Stretches   Passive Hamstring Stretch Left;2 reps;30 seconds   supine with overpressure from pt.   Quad Stretch Left;30 seconds;3 reps   prone with strap   Piriformis Stretch Left;Right;2 reps;20 seconds    Gastroc Stretch Left;2 reps;20 seconds    Other Knee/Hip Stretches seated adductor stretch with leg out to side x 20 sec x 2 reps LLE, 1 rep RLE.      Knee/Hip Exercises: Aerobic   Recumbent Bike L1: 5 min for ROM and warm up.      Knee/Hip Exercises: Standing   Heel Raises Limitations up with 2, wt shift to Lt, and down mostly with LLE x 10    Other Standing Knee Exercises side step with sit to/from stand R/L length of black table.  heel/toe walking R/Lt with UEon counter.  3 heel raises with hands sliding up cabinets.    Other Standing Knee Exercises retro gait  with focus on TKE x 12 ft x 4      Modalities   Modalities --   declined modalities - to ice at home                     PT Short Term Goals - 07/01/21 1659       PT SHORT TERM GOAL #1   Title Independent in inital HEP    Time 6    Period Weeks    Status Achieved    Target Date 07/10/21      PT SHORT TERM GOAL #2   Title normal gait pattern with cane to  no assistive device    Time 6    Period Weeks    Status Achieved    Target Date 07/10/21      PT SHORT TERM GOAL #3   Title AROM Lt  knee -3 deg extension to 100 deg flexion    Baseline 4 to 112 degrees on 7/26    Time 6    Period Weeks    Status Achieved    Target Date 07/10/21               PT Long Term Goals - 07/11/21 1508       PT LONG TERM GOAL #1   Title The patient will be indep with HEP including community aquatic pool program as appropriate    Time 12    Period Weeks    Status On-going      PT LONG TERM GOAL #2   Title 5/5 strength Lt LE    Baseline -    Time 12    Period Weeks    Status Partially Met      PT LONG TERM GOAL #3   Title Patient reports return to normal functional activities including walking for 20 min without increased symptoms    Baseline -    Time 12    Period Weeks    Status Partially Met      PT LONG TERM GOAL #4   Title AROM Lt knee 0 deg extension to 110 deg flexion    Baseline -    Time 12    Period Weeks    Status Partially Met      PT LONG TERM GOAL #5   Title Improve functional limitation score to 53    Time 12    Period Weeks    Status Achieved                   Plan - 07/11/21 1509     Clinical Impression Statement Pt demonstrated improved LE strength.  Lt knee ROM  and functional mobility gradually improving. FOTO goal met. Pt has partially met her goals and will benefit from continued PT intervention to maximize mobilty with less pain.    Rehab Potential Good    PT Frequency 2x / week    PT Duration 6 weeks    PT Treatment/Interventions ADLs/Self Care Home Management;Aquatic Therapy;Cryotherapy;Electrical Stimulation;Iontophoresis 4mg /ml Dexamethasone;Moist Heat;Ultrasound;Gait training;Stair training;Functional mobility training;Therapeutic activities;Therapeutic exercise;Balance training;Neuromuscular re-education;Patient/family education;Manual techniques;Passive range of motion;Dry  needling;Taping;Vasopneumatic Device    PT Next Visit Plan progress with ROM and strength Lt LE; gait training.    PT Home Exercise Plan [place] and Agree with Plan of Care Patient             Patient will benefit from skilled therapeutic intervention in order to improve the following deficits and impairments:  Abnormal gait, Decreased range of motion, Difficulty walking, Pain, Decreased activity tolerance, Decreased balance, Impaired flexibility, Decreased mobility, Decreased strength, Increased edema, Postural dysfunction  Visit Diagnosis: Stiffness of left knee  Weakness generalized  Other symptoms and signs involving the musculoskeletal system  Abnormal gait     Problem List Patient Active Problem List   Diagnosis Date Noted   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/10/2021   DDD (degenerative disc disease), cervical 12/03/2020   Severe pain of left shoulder 11/05/2020   Fibromyalgia 03/05/2020   Knee pain, acute 03/05/2020   Osteoarthritis 03/05/2020   Diverticulitis of sigmoid colon 08/01/2019   Generalized anxiety disorder with panic attacks 08/01/2019   Unspecified atrial fibrillation 06/18/2019   Major depressive disorder 09/09/2018   Dry eye syndrome of both eyes 09/09/2018  Microscopic hematuria 09/09/2018   Non-seasonal allergic rhinitis 09/09/2018   Hyperlipidemia 09/09/2018   Essential hypertension 09/09/2018   History of palpitations 09/09/2018   Chronic bilateral low back pain without sciatica 10/07/2016   Chronic bilateral upper abdominal pain 10/07/2016   Primary osteoarthritis of both knees post bilateral hemiarthroplasty 10/07/2016   Candidosis of skin 04/17/2016   Cherry angioma 04/17/2016   Dry skin 04/17/2016   Dermal nevus of left forearm 04/17/2016   Multiple benign nevi 04/17/2016   Scar condition and fibrosis of skin 04/17/2016    Sebaceous hyperplasia 04/17/2016   Seborrheic keratoses 04/17/2016   Skin tags, multiple acquired 04/17/2016   Solar lentigo 04/17/2016   S/P laparoscopic fundoplication 17/47/1595   Gastroesophageal reflux disease without esophagitis 11/06/2015   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   Primary osteoarthritis of right knee 11/21/2014   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

## 2021-07-15 ENCOUNTER — Other Ambulatory Visit: Payer: Self-pay

## 2021-07-15 ENCOUNTER — Encounter: Payer: Self-pay | Admitting: Osteopathic Medicine

## 2021-07-15 ENCOUNTER — Ambulatory Visit (INDEPENDENT_AMBULATORY_CARE_PROVIDER_SITE_OTHER): Payer: Medicare Other | Admitting: Osteopathic Medicine

## 2021-07-15 VITALS — BP 120/73 | HR 86 | Temp 98.1°F | Wt 177.0 lb

## 2021-07-15 DIAGNOSIS — R5382 Chronic fatigue, unspecified: Secondary | ICD-10-CM | POA: Diagnosis not present

## 2021-07-15 DIAGNOSIS — I480 Paroxysmal atrial fibrillation: Secondary | ICD-10-CM

## 2021-07-15 HISTORY — DX: Paroxysmal atrial fibrillation: I48.0

## 2021-07-15 NOTE — Patient Instructions (Signed)
Will see what Dr Bettina Gavia says about possibly changing Rx  Consider sleep study  See Dr Bryan Lemma re: GI issues, may be affecting nutrient status .  FYI to my patients: After six years here, I will be leaving practice at [facility]. My last day here will be 08/23/2021. I will continue to provide your care up until that date, and you will still be considered a patient here after that as long as you want to be!    You will get a letter in the mail explaining details, but after 08/23/2021, my patients have several options to continue care:  1) you can establish care with Dr. Luetta Nutting or Samuel Bouche NP, who are accepting new patients here and are absorbing many folks from my current patient panel, OR...  2) you can see any available provider here on as-needed basis until my official replacement starts (hiring a new doctor has not been finalized yet), OR.Marland KitchenMarland Kitchen 3) if you choose to seek care elsewhere, this office will be happy to facilitate transfer of records, and will refill medications on a case-by-case basis.   It is bittersweet to leave! 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 I have truly enjoyed taking care of folks here, but I am also excited for my next adventure doing something a bit different. Take care, and please let us know if you have any questions!   -Dr. Loni Muse.

## 2021-07-15 NOTE — Progress Notes (Signed)
Melissa Rodgers is a 70 y.o. female who presents to  [place] [facility]  today, 07/15/21, seeking care for the following:  FOLLOW UP UP FATIGUE, REVIEW LABS - see below, no lab values to explain fatigue or give concern for serious pathology. We decided to HOLD losartan 25 mg daily, consider alternative beta blocker for paroxysmal Afib (Bystolic instead of metoprolol - awaiting cardiology recs on this, has appt w/ Dr Bettina Gavia next week), d/w him re: possible adverse effects of antiarrhythmics   BP Readings from Last 3 Encounters:  07/15/21 120/73  07/01/21 131/71  05/11/21 105/65   Wt Readings from Last 3 Encounters:  07/15/21 177 lb (80.3 kg)  07/01/21 178 lb 0.6 oz (80.8 kg)  05/10/21 183 lb (83 kg)        ASSESSMENT & PLAN with other pertinent findings:  The primary encounter diagnosis was Chronic fatigue. A diagnosis of Paroxysmal atrial fibrillation (HCC) was also pertinent to this visit.   Unfortunately not much to offer patient as this point re: fatigue,but thankfully no red flags for serious illness. I think may be cardiac Rx side effect, deconditioning, fibromyalgia effect.   Patient Instructions  Will see what Dr Bettina Gavia says about possibly changing Rx  Consider sleep study  See Dr Bryan Lemma re: GI issues, may be affecting nutrient status .  FYI to my patients: After six years here, I will be leaving practice at [facility]. My last day here will be 08/23/2021. I will continue to provide your care up until that date, and you will still be considered a patient here after that as long as you want to be!    You will get a letter in the mail explaining details, but after 08/23/2021, my patients have several options to continue care:  1) you can establish care with Dr. Luetta Nutting or Samuel Bouche NP, who are accepting new patients here and are absorbing many folks from my current patient panel, OR...   2) you can see any available provider here on as-needed basis until my official replacement starts (hiring a new doctor has not been finalized yet), OR.Marland KitchenMarland Kitchen 3) if you choose to seek care elsewhere, this office will be happy to facilitate transfer of records, and will refill medications on a case-by-case basis.   It is bittersweet to leave! I will be practicing inpatient hospital medicine at 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 I have truly enjoyed taking care of folks here, but I am also excited for my next adventure doing something a bit different. Take care, and please let us know if you have any questions!   -Dr. Loni Muse.     No orders of the defined types were placed in this encounter.   No orders of the defined types were placed in this encounter.    See below for relevant physical exam findings  See below for recent lab and imaging results reviewed  Medications, allergies, PMH, PSH, SocH, FamH reviewed below    Follow-up instructions: No follow-ups on file.                                        Exam:  BP 120/73 (BP Location: Left Arm, Patient Position: Sitting, Cuff Size: Normal)   Pulse 86   Temp 98.1 F (  36.7 C) (Oral)   Wt 177 lb (80.3 kg)   BMI 31.35 kg/m  Constitutional: VS see above. General Appearance: alert, well-developed, well-nourished, NAD Neck: No masses, trachea midline.  Respiratory: Normal respiratory effort.  Musculoskeletal: Gait normal. Symmetric and independent movement of all extremities Neurological: Normal balance/coordination. No tremor. Skin: warm, dry, intact.  Psychiatric: Normal judgment/insight. Normal mood and affect. Oriented x3.   Current Meds  Medication Sig   apixaban (ELIQUIS) 5 MG TABS tablet Take 5 mg by mouth 2 (two) times daily.   cetirizine (ZYRTEC) 10 MG tablet Take 10 mg by mouth daily.   Cholecalciferol (VITAMIN D-3)  1000 units CAPS Take 1,000 Units by mouth daily.   clonazePAM (KLONOPIN) 0.5 MG tablet Take 0.5 mg by mouth daily as needed for anxiety.   Cyanocobalamin (B-12 PO) Take 1 tablet by mouth daily.   diltiazem (CARDIZEM) 30 MG tablet Take 1 tablet (30 mg total) by mouth every 4 (four) hours as needed.   docusate sodium (COLACE) 100 MG capsule Take 1 capsule (100 mg total) by mouth 2 (two) times daily.   flecainide (TAMBOCOR) 50 MG tablet TAKE 1 TABLET(50 MG) BY MOUTH TWICE DAILY   FLUoxetine (PROZAC) 10 MG tablet Take 10 mg by mouth daily.   gabapentin (NEURONTIN) 300 MG capsule Take 1 capsule (300 mg total) by mouth 2 (two) times daily.   HYDROcodone-acetaminophen (NORCO/VICODIN) 5-325 MG tablet Take 1 tablet by mouth every 6 (six) hours as needed for moderate pain.   metoprolol succinate (TOPROL-XL) 25 MG 24 hr tablet TAKE 1 TABLET(25 MG) BY MOUTH DAILY   mupirocin nasal ointment (BACTROBAN) 2 % Place 1 application into the nose 2 (two) times daily. Apply to both nares BID for 5 days.    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   DDD (degenerative disc disease), cervical 12/03/2020   Fibromyalgia 03/05/2020   Diverticulitis of sigmoid colon 08/01/2019   Generalized anxiety disorder with panic attacks 08/01/2019   Major depressive disorder, single episode, in remission ([place]) 09/09/2018   Non-seasonal allergic rhinitis 09/09/2018   Hyperlipidemia 09/09/2018   Essential hypertension 09/09/2018   Chronic bilateral low back pain without sciatica 10/07/2016   Chronic bilateral upper abdominal pain 10/07/2016   Primary osteoarthritis of both knees post bilateral hemiarthroplasty 10/07/2016   S/P laparoscopic fundoplication  69/48/5462   Gastroesophageal reflux disease without esophagitis 11/06/2015   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   Subarachnoid hemorrhage following injury 2010    Family History  Problem Relation Age of Onset   Diabetes Mother    Heart disease Mother    Alzheimer's disease Father        Symptom onset in late 58s   Atrial fibrillation Sister    Tremor Other        Significant maternal history of tremor; unknown regarding Parkinson's disease diagnoses   Colon cancer Neg Hx    Esophageal cancer Neg Hx     Social History   Tobacco Use  Smoking Status Former   Types: Cigarettes   Quit date: 08/18/1983   Years since quitting: 37.9  Smokeless Tobacco Never    Past Surgical History:  Procedure Laterality Date   ESOPHAGOGASTRODUODENOSCOPY     possibly last one was 2017. In [place]  HERNIA REPAIR     MEDIAL PARTIAL KNEE REPLACEMENT Right 20/35/5974   NISSEN FUNDOPLICATION  16/38/4536   OOPHORECTOMY     PARTIAL KNEE ARTHROPLASTY Left 04/05/2014   TOTAL KNEE REVISION Left 05/10/2021   Procedure: TOTAL KNEE REVISION;  Surgeon: Dorna Leitz, MD;  Location: [facility];  Service: Orthopedics;  Laterality: Left;    Immunization History  Administered Date(s) Administered   Fluad Quad(high Dose 65+) 08/29/2019   Influenza Whole 09/13/2014   Influenza, High Dose Seasonal PF 10/07/2016, 09/15/2017, 09/09/2018   Influenza,inj,Quad PF,6+ Mos 09/26/2015   Influenza,inj,quad, With Preservative 09/09/2013   Influenza-Unspecified 09/06/2020   PFIZER(Purple Top)SARS-COV-2 Vaccination 12/16/2019, 01/05/2020, 08/23/2020, 03/18/2021   Pneumococcal Conjugate-13 08/10/2015   Pneumococcal Polysaccharide-23 10/14/2013   Tdap 03/20/2015   Zoster, Live 01/27/2013    Recent Results (from the past 2160 hour(s))  Surgical pcr screen     Status: Abnormal   Collection Time: 04/29/21 11:23 AM   Specimen: Nasal Mucosa; Nasal Swab  Result Value Ref Range   MRSA, PCR NEGATIVE NEGATIVE   Staphylococcus aureus  POSITIVE (A) NEGATIVE    Comment: (NOTE) The Xpert SA Assay (FDA approved for NASAL specimens in patients 30 years of age and older), is one component of a comprehensive surveillance program. It is not intended to diagnose infection nor to guide or monitor treatment. Performed at [facility], [place] [address], [place], [place] [address]   CBC WITH DIFFERENTIAL     Status: Abnormal   Collection Time: 04/29/21 11:23 AM  Result Value Ref Range   WBC 7.8 4.0 - 10.5 K/uL   RBC 4.97 3.87 - 5.11 MIL/uL   Hemoglobin 14.7 12.0 - 15.0 g/dL   HCT 46.2 (H) 36.0 - 46.0 %   MCV 93.0 80.0 - 100.0 fL   MCH 29.6 26.0 - 34.0 pg   MCHC 31.8 30.0 - 36.0 g/dL   RDW 13.2 11.5 - 15.5 %   Platelets 302 150 - 400 K/uL   nRBC 0.0 0.0 - 0.2 %   Neutrophils Relative % 53 %   Neutro Abs 4.2 1.7 - 7.7 K/uL   Lymphocytes Relative 34 %   Lymphs Abs 2.6 0.7 - 4.0 K/uL   Monocytes Relative 8 %   Monocytes Absolute 0.6 0.1 - 1.0 K/uL   Eosinophils Relative 3 %   Eosinophils Absolute 0.3 0.0 - 0.5 K/uL   Basophils Relative 1 %   Basophils Absolute 0.1 0.0 - 0.1 K/uL   Immature Granulocytes 1 %   Abs Immature Granulocytes 0.04 0.00 - 0.07 K/uL    Comment: Performed at [facility], [place] [address] [place], [place] [address]  Comprehensive metabolic panel     Status: None   Collection Time: 04/29/21 11:23 AM  Result Value Ref Range   Sodium 137 135 - 145 mmol/L   Potassium 3.9 3.5 - 5.1 mmol/L   Chloride 102 98 - 111 mmol/L   CO2 28 22 - 32 mmol/L   Glucose, Bld 95 70 - 99 mg/dL    Comment: Glucose reference range applies only to samples taken after fasting for at least 8 hours.   BUN 14 8 - 23 mg/dL   Creatinine, Ser 0.60 0.44 - 1.00 mg/dL   Calcium 9.1 8.9 - 10.3 mg/dL   Total Protein 7.9 6.5 - 8.1 g/dL   Albumin 4.3 3.5 - 5.0 g/dL   AST 25 15 - 41 U/L   ALT 26 0 - 44 U/L   Alkaline Phosphatase 63  38 - 126 U/L   Total Bilirubin 0.7 0.3 - 1.2 mg/dL   GFR,  Estimated >60 >60 mL/min    Comment: (NOTE) Calculated using the CKD-EPI Creatinine Equation (2021)    Anion gap 7 5 - 15    Comment: Performed at [facility], [place] [address], [place], [place] [address]  Protime-INR     Status: None   Collection Time: 04/29/21 11:23 AM  Result Value Ref Range   Prothrombin Time 15.1 11.4 - 15.2 seconds   INR 1.2 0.8 - 1.2    Comment: (NOTE) INR goal varies based on device and disease states. Performed at [facility], [place] [address] [place], [place] [address]   APTT     Status: Abnormal   Collection Time: 04/29/21 11:23 AM  Result Value Ref Range   aPTT 46 (H) 24 - 36 seconds    Comment:        IF BASELINE aPTT IS ELEVATED, SUGGEST PATIENT RISK ASSESSMENT BE USED TO DETERMINE APPROPRIATE ANTICOAGULANT THERAPY. Performed at [facility], [place] [address], [place], [place] [address]   Urinalysis, Routine w reflex microscopic Urine, Clean Catch     Status: Abnormal   Collection Time: 04/29/21 11:23 AM  Result Value Ref Range   Color, Urine STRAW (A) YELLOW   APPearance CLEAR CLEAR   Specific Gravity, Urine 1.005 1.005 - 1.030   pH 7.0 5.0 - 8.0   Glucose, UA NEGATIVE NEGATIVE mg/dL   Hgb urine dipstick SMALL (A) NEGATIVE   Bilirubin Urine NEGATIVE NEGATIVE   Ketones, ur NEGATIVE NEGATIVE mg/dL   Protein, ur NEGATIVE NEGATIVE mg/dL   Nitrite NEGATIVE NEGATIVE   Leukocytes,Ua NEGATIVE NEGATIVE   RBC / HPF 0-5 0 - 5 RBC/hpf   WBC, UA 0-5 0 - 5 WBC/hpf   Bacteria, UA NONE SEEN NONE SEEN    Comment: Performed at [facility], [place] [address], [place], [place] [address]  Type and screen Order type and screen if day of surgery is less than 15 days from draw of preadmission visit or order morning of surgery if day of surgery is greater than 6 days from preadmission visit.     Status: None   Collection Time: 04/29/21 11:23 AM  Result Value Ref Range   ABO/RH(D) O POS     Antibody Screen NEG    Sample Expiration 05/13/2021,2359    Extend sample reason      NO TRANSFUSIONS OR PREGNANCY IN THE PAST 3 MONTHS Performed at [facility], [place] [address], [place], [place] [address]   Pro b natriuretic peptide (BNP)     Status: None   Collection Time: 05/02/21 11:14 AM  Result Value Ref Range   NT-Pro BNP 48 0 - 301 pg/mL    Comment: The following cut-points have been suggested for the use of proBNP for the diagnostic evaluation of heart failure (HF) in patients with acute dyspnea: Modality                     Age           Optimal Cut                            (years)            Point ------------------------------------------------------ Diagnosis (rule in HF)        <50  450 pg/mL                           50 - 75            900 pg/mL                               >75           1800 pg/mL Exclusion (rule out HF)  Age independent     300 pg/mL   ECHOCARDIOGRAM COMPLETE     Status: None   Collection Time: 05/03/21 11:48 AM  Result Value Ref Range   S' Lateral 2.50 cm   Single Plane A4C EF 53.0 %   Area-P 1/2 2.76 cm2   P 1/2 time 748 msec  SARS CORONAVIRUS 2 (TAT 6-24 HRS) Nasopharyngeal Nasopharyngeal Swab     Status: None   Collection Time: 05/07/21 10:45 AM   Specimen: Nasopharyngeal Swab  Result Value Ref Range   SARS Coronavirus 2 NEGATIVE NEGATIVE    Comment: (NOTE) SARS-CoV-2 target nucleic acids are NOT DETECTED.  The SARS-CoV-2 RNA is generally detectable in upper and lower respiratory specimens during the acute phase of infection. Negative results do not preclude SARS-CoV-2 infection, do not rule out co-infections with other pathogens, and should not be used as the sole basis for treatment or other patient management decisions. Negative results must be combined with clinical observations, patient history, and epidemiological information. The expected result is Negative.  Fact Sheet for  Patients: HairSlick.no  Fact Sheet for Healthcare Providers: quierodirigir.com  This test is not yet approved or cleared by the [place] FDA and  has been authorized for detection and/or diagnosis of SARS-CoV-2 by FDA under an Emergency Use Authorization (EUA). This EUA will remain  in effect (meaning this test can be used) for the duration of the COVID-19 declaration under Se ction 564(b)(1) of the Act, 21 U.S.C. section 360bbb-3(b)(1), unless the authorization is terminated or revoked sooner.  Performed at [facility], [address] [place], [place] [address]   APTT     Status: None   Collection Time: 05/10/21  9:42 AM  Result Value Ref Range   aPTT 36 24 - 36 seconds    Comment: Performed at [facility], [address] [place], [place] [address]  ABO/Rh     Status: None   Collection Time: 05/10/21  9:44 AM  Result Value Ref Range   ABO/RH(D)      O POS Performed at [facility], [address], [place], [place] [address]   Aerobic/Anaerobic Culture w Gram Stain (surgical/deep wound)     Status: None   Collection Time: 05/10/21 12:03 PM   Specimen: Synovial, Left Knee; Body Fluid  Result Value Ref Range   Specimen Description      KNEE LEFT Performed at [facility], [address], [place], [place] [address]    Special Requests      NONE Performed at [facility], [address], [place], [place] [address]    Gram Stain      RARE WBC PRESENT, PREDOMINANTLY MONONUCLEAR NO ORGANISMS SEEN    Culture      No growth aerobically or anaerobically. Performed at [facility], [address] [place], [place] [address]    Report Status 05/15/2021 FINAL   CBC  Status: Abnormal   Collection Time: 05/11/21  3:42 AM  Result Value Ref Range   WBC 15.1 (H) 4.0 - 10.5 K/uL   RBC 4.04 3.87 - 5.11 MIL/uL   Hemoglobin 11.7 (L)  12.0 - 15.0 g/dL   HCT 37.1 36.0 - 46.0 %   MCV 91.8 80.0 - 100.0 fL   MCH 29.0 26.0 - 34.0 pg   MCHC 31.5 30.0 - 36.0 g/dL   RDW 13.4 11.5 - 15.5 %   Platelets 239 150 - 400 K/uL   nRBC 0.0 0.0 - 0.2 %    Comment: Performed at [facility], [place] [address], [place], [place] [address]  CBC with Differential/Platelet     Status: Abnormal   Collection Time: 07/01/21 12:00 AM  Result Value Ref Range   WBC 6.1 3.8 - 10.8 Thousand/uL   RBC 4.78 3.80 - 5.10 Million/uL   Hemoglobin 13.6 11.7 - 15.5 g/dL   HCT 43.1 35.0 - 45.0 %   MCV 90.2 80.0 - 100.0 fL   MCH 28.5 27.0 - 33.0 pg   MCHC 31.6 (L) 32.0 - 36.0 g/dL   RDW 12.8 11.0 - 15.0 %   Platelets 362 140 - 400 Thousand/uL   MPV 9.9 7.5 - 12.5 fL   Neutro Abs 3,410 1,500 - 7,800 cells/uL   Lymphs Abs 1,909 850 - 3,900 cells/uL   Absolute Monocytes 525 200 - 950 cells/uL   Eosinophils Absolute 207 15 - 500 cells/uL   Basophils Absolute 49 0 - 200 cells/uL   Neutrophils Relative % 55.9 %   Total Lymphocyte 31.3 %   Monocytes Relative 8.6 %   Eosinophils Relative 3.4 %   Basophils Relative 0.8 %  COMPLETE METABOLIC PANEL WITH GFR     Status: Abnormal   Collection Time: 07/01/21 12:00 AM  Result Value Ref Range   Glucose, Bld 74 65 - 99 mg/dL    Comment: .            Fasting reference interval .    BUN 14 7 - 25 mg/dL   Creat 0.50 (L) 0.60 - 1.00 mg/dL   eGFR 100 > OR = 60 mL/min/1.74m2    Comment: The eGFR is based on the CKD-EPI 2021 equation. To calculate  the new eGFR from a previous Creatinine or Cystatin C result, go to https://www.kidney.org/professionals/ kdoqi/gfr%5Fcalculator    BUN/Creatinine Ratio 28 (H) 6 - 22 (calc)   Sodium 139 135 - 146 mmol/L   Potassium 4.4 3.5 - 5.3 mmol/L   Chloride 105 98 - 110 mmol/L   CO2 28 20 - 32 mmol/L   Calcium 9.4 8.6 - 10.4 mg/dL   Total Protein 7.2 6.1 - 8.1 g/dL   Albumin 4.2 3.6 - 5.1 g/dL   Globulin 3.0 1.9 - 3.7 g/dL (calc)   AG Ratio 1.4 1.0 - 2.5  (calc)   Total Bilirubin 0.4 0.2 - 1.2 mg/dL   Alkaline phosphatase (APISO) 74 37 - 153 U/L   AST 17 10 - 35 U/L   ALT 15 6 - 29 U/L  Lipid panel     Status: Abnormal   Collection Time: 07/01/21 12:00 AM  Result Value Ref Range   Cholesterol 204 (H) <200 mg/dL   HDL 42 (L) > OR = 50 mg/dL   Triglycerides 356 (H) <150 mg/dL    Comment: . If a non-fasting specimen was collected, consider repeat triglyceride testing on a fasting specimen if clinically indicated.  Yates Decamp et al. Lenna Sciara. of  Clin. Lipidol. 1155;2:080-223. Marland Kitchen    LDL Cholesterol (Calc) 111 (H) mg/dL (calc)    Comment: Reference range: <100 . Desirable range <100 mg/dL for primary prevention;   <70 mg/dL for patients with CHD or diabetic patients  with > or = 2 CHD risk factors. Marland Kitchen LDL-C is now calculated using the Martin-Hopkins  calculation, which is a validated novel method providing  better accuracy than the Friedewald equation in the  estimation of LDL-C.  Cresenciano Genre et al. Annamaria Helling. 3612;244(97): 2061-2068  (http://education.[facility].com/faq/FAQ164)    Total CHOL/HDL Ratio 4.9 <5.0 (calc)   Non-HDL Cholesterol (Calc) 162 (H) <130 mg/dL (calc)    Comment: For patients with diabetes plus 1 major ASCVD risk  factor, treating to a non-HDL-C goal of <100 mg/dL  (LDL-C of <70 mg/dL) is considered a therapeutic  option.   TSH     Status: None   Collection Time: 07/01/21 12:00 AM  Result Value Ref Range   TSH 2.16 0.40 - 4.50 mIU/L  Hemoglobin A1c     Status: None   Collection Time: 07/01/21 12:00 AM  Result Value Ref Range   Hgb A1c MFr Bld 5.6 <5.7 % of total Hgb    Comment: For the purpose of screening for the presence of diabetes: . <5.7%       Consistent with the absence of diabetes 5.7-6.4%    Consistent with increased risk for diabetes             (prediabetes) > or =6.5%  Consistent with diabetes . This assay result is consistent with a decreased risk of diabetes. . Currently, no consensus exists  regarding use of hemoglobin A1c for diagnosis of diabetes in children. . According to American Diabetes Association (ADA) guidelines, hemoglobin A1c <7.0% represents optimal control in non-pregnant diabetic patients. Different metrics may apply to specific patient populations.  Standards of Medical Care in Diabetes(ADA). .    Mean Plasma Glucose 114 mg/dL   eAG (mmol/L) 6.3 mmol/L  Sedimentation rate     Status: None   Collection Time: 07/01/21 12:00 AM  Result Value Ref Range   Sed Rate 17 0 - 30 mm/h  High sensitivity CRP     Status: Abnormal   Collection Time: 07/01/21 12:00 AM  Result Value Ref Range   hs-CRP 3.1 (H) mg/L    Comment: Reference Range Optimal <1.0 Jellinger PS et al. [facility] [place] Pract.2017;23(Suppl 2):1-87. . For ages >5 Years: hs-CRP mg/L  Risk According to AHA/CDC Guidelines <1.0         Lower relative cardiovascular risk. 1.0-3.0      Average relative cardiovascular risk. 3.1-10.0     Higher relative cardiovascular risk.              Consider retesting in 1 to 2 weeks to              exclude a benign transient elevation              in the baseline CRP value secondary              to infection or inflammation. >10.0        Persistent elevation, upon retesting,              may be associated with infection and              inflammation. .   Urinalysis, Routine w reflex microscopic     Status: Abnormal   Collection Time: 07/01/21 12:00 AM  Result  Value Ref Range   Color, Urine YELLOW YELLOW   APPearance CLEAR CLEAR   Specific Gravity, Urine 1.016 1.001 - 1.035   pH 5.5 5.0 - 8.0   Glucose, UA NEGATIVE NEGATIVE   Bilirubin Urine NEGATIVE NEGATIVE   Ketones, ur NEGATIVE NEGATIVE   Hgb urine dipstick TRACE (A) NEGATIVE   Protein, ur NEGATIVE NEGATIVE   Nitrite NEGATIVE NEGATIVE   Leukocytes,Ua TRACE (A) NEGATIVE   WBC, UA NONE SEEN 0 - 5 /HPF   RBC / HPF NONE SEEN 0 - 2 /HPF   Squamous Epithelial / LPF NONE SEEN < OR = 5 /HPF   Bacteria, UA NONE  SEEN NONE SEEN /HPF   Hyaline Cast NONE SEEN NONE SEEN /LPF  MICROSCOPIC MESSAGE     Status: None   Collection Time: 07/01/21 12:00 AM  Result Value Ref Range   Note      Comment: This urine was analyzed for the presence of WBC,  RBC, bacteria, casts, and other formed elements.  Only those elements seen were reported. . .     No results found.     All questions at time of visit were answered - patient instructed to contact office with any additional concerns or updates. ER/RTC precautions were reviewed with the patient as applicable.   Please note: manual typing as well as voice recognition software may have been used to produce this document - typos may escape review. Please contact Dr. Sheppard Coil for any needed clarifications.   Total encounter time on date of service, 07/15/21, was 30 minutes spent addressing problems/issues as noted above in [place], including time spent in discussion with patient regarding the HPI, ROS, confirming history, reviewing Assessment & Plan, as well as time spent on coordination of care, record review.

## 2021-07-17 ENCOUNTER — Encounter: Payer: Medicare Other | Admitting: Physical Therapy

## 2021-07-19 ENCOUNTER — Ambulatory Visit (INDEPENDENT_AMBULATORY_CARE_PROVIDER_SITE_OTHER): Payer: Medicare Other | Admitting: Physical Therapy

## 2021-07-19 ENCOUNTER — Encounter: Payer: Self-pay | Admitting: Physical Therapy

## 2021-07-19 ENCOUNTER — Other Ambulatory Visit: Payer: Self-pay

## 2021-07-19 DIAGNOSIS — R531 Weakness: Secondary | ICD-10-CM

## 2021-07-19 DIAGNOSIS — M25662 Stiffness of left knee, not elsewhere classified: Secondary | ICD-10-CM

## 2021-07-19 DIAGNOSIS — R29898 Other symptoms and signs involving the musculoskeletal system: Secondary | ICD-10-CM

## 2021-07-19 NOTE — Therapy (Signed)
[place], [place], [address] Phone: [phone]   Fax:  [phone]  Physical Therapy Treatment  Patient Details  Name: Melissa Rodgers MRN: [id number] Date of Birth: Jan 13, 1951 Referring Provider (PT): Dr Dorna Leitz   Encounter Date: 07/19/2021   PT End of Session - 07/19/21 0813     Visit Number 12    Number of Visits 24    Date for PT Re-Evaluation 08/21/21    PT Start Time 0802    PT Stop Time 0846    PT Time Calculation (min) 44 min    Activity Tolerance Patient tolerated treatment well    Behavior During Therapy [facility] for tasks assessed/performed             Past Medical History:  Diagnosis Date   Anemia    Atrial fibrillation 06/18/2019   BMI 30.0-30.9,adult 01/27/2013   Candidosis of skin 04/17/2016   Formatting of this note might be different from the original. 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 01/29/15:  24 Hour Holter Monitor CONCLUSIONS: 1.  Overall normal 24-hour Holter monitor recording, with rare, brief,  asymptomatic nonsustained supraventricular tachycardia as described.  2. No cardiac rhythm explanation for patient's symptoms.       Primary osteoarthritis of both knees 10/07/2016   Formatting of this note might be different from the original. 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   Primary osteoarthritis of right knee 11/21/2014   S/P laparoscopic fundoplication 2/70/6237   Scar condition and fibrosis of skin 04/17/2016   Formatting of this note might be different from the original. lower abdomen   Sebaceous hyperplasia 04/17/2016   Seborrheic keratoses 04/17/2016   Skin tags, multiple acquired 04/17/2016   Solar lentigo 04/17/2016   Subarachnoid hemorrhage following injury 2010   Fall from horse; brief hospitalization    Past Surgical History:  Procedure Laterality Date   ESOPHAGOGASTRODUODENOSCOPY     possibly last one was 2017. In [place]   HERNIA REPAIR     MEDIAL PARTIAL KNEE REPLACEMENT Right 62/83/1517   NISSEN FUNDOPLICATION  61/60/7371   OOPHORECTOMY     PARTIAL KNEE ARTHROPLASTY Left 04/05/2014   TOTAL KNEE REVISION Left 05/10/2021   Procedure: TOTAL KNEE REVISION;  Surgeon: Dorna Leitz, MD;  Location: [facility];  Service: Orthopedics;  Laterality: Left;    There were no vitals filed for this visit.   Subjective Assessment - 07/19/21 0803     Subjective Pt reports she saw her surgeon. He is pleased with progress  and isn't surprised she is still having pain due to it being a revision.   She is sore today, and unsure why - maybe a change in walking surfaces and over stretching.     Pertinent History Rt partial knee replacement 2016; a-fib; HTN; mild anxiety/depression; arthritis; fundoplication 3244; shoulder pain; neck pain    Patient Stated Goals improve flexibility and recovery from knee surgery    Currently in Pain? Yes    Pain Score 4     Pain Location Knee    Pain  Orientation Right;Left;Posterior    Pain Descriptors / Indicators Sore    Aggravating Factors  varies, over stretching.    Pain Relieving Factors medicine                [facility] PT Assessment - 07/19/21 0001       Assessment   Medical Diagnosis Lt TKA    Referring Provider (PT) Dr Dorna Leitz    Onset Date/Surgical Date 05/10/21    Hand Dominance Left    Next MD Visit 8 wks    Prior Therapy here for knee and shoulder             [facility] Adult PT Treatment/Exercise - 07/19/21 0001       Self-Care   Other Self-Care Comments  reviewed muscle rolling with stick to LE; pt returned demo.      Knee/Hip Exercises: Stretches   Passive Hamstring Stretch Left;Right;2 reps;20 seconds    Gastroc Stretch 20 seconds;1 rep;Both    Other Knee/Hip Stretches seated and standing adductor stretches x 10 sec - NOT tolerated today; increased pain to area. Cues to reduce amount of stretch      Knee/Hip Exercises: Aerobic   Recumbent Bike L2: 5 min for warm up      Knee/Hip Exercises: Standing   Other Standing Knee Exercises side step with increased step height (imaginary shoe box) x 18 ft Rt/Lt x 2 reps.  monster walk (no resistance) x 25 ft ; backwards slow gait with glute squeeze x 25 ft.    Other Standing Knee Exercises tandem stance x 20 sec each leg forward, 2 reps, some cramping in Rt posterior leg when in back)      Knee/Hip Exercises: Seated   Sit to Sand 10 reps;without UE support   slow     Modalities   Modalities Moist Heat      Moist Heat Therapy   Moist Heat Location Lumbar Spine   and hamstring during STM to opp leg     Manual Therapy   Manual therapy comments pt very point tender in bilat biceps femoris    Soft tissue mobilization STM to bilat hamstrings                      PT Short Term Goals - 07/01/21 1659       PT SHORT TERM GOAL #1   Title Independent in inital HEP    Time 6    Period Weeks    Status Achieved    Target Date 07/10/21      PT  SHORT TERM GOAL #2   Title normal gait pattern with cane to no assistive device    Time 6    Period Weeks    Status Achieved    Target Date 07/10/21      PT SHORT TERM GOAL #3   Title AROM Lt knee -3 deg extension to 100 deg flexion    Baseline  4 to 112 degrees on 7/26    Time 6    Period Weeks    Status Achieved    Target Date 07/10/21               PT Long Term Goals - 07/19/21 0953       PT LONG TERM GOAL #1   Title The patient will be indep with HEP including community aquatic pool program as appropriate    Time 12    Period Weeks    Status Partially Met      PT LONG TERM GOAL #2   Title 5/5 strength Lt LE    Baseline -    Time 12    Period Weeks    Status Partially Met      PT LONG TERM GOAL #3   Title Patient reports return to normal functional activities including walking for 20 min without increased symptoms    Baseline -    Time 12    Period Weeks    Status Partially Met      PT LONG TERM GOAL #4   Title AROM Lt knee 0 deg extension to 110 deg flexion    Baseline -    Time 12    Period Weeks    Status Achieved      PT LONG TERM GOAL #5   Title Improve functional limitation score to 53    Time 12    Period Weeks    Status Achieved                   Plan - 07/19/21 0815     Clinical Impression Statement Encouraged pt to dial back exercises as needed to not over-do it.  She had occasional cramps or discomfort in Rt/Lt knees/ legs with exercises that would disappate with walking around. She demonstrated improved Lt knee ext.  Pt reported reduction of pain in LE to 2/10 by end of session.  Pt verbalized desire to d/c after next visit; would like to review/ progress HEP.    Rehab Potential Good    PT Frequency 2x / week    PT Duration 6 weeks    PT Treatment/Interventions ADLs/Self Care Home Management;Aquatic Therapy;Cryotherapy;Electrical Stimulation;Iontophoresis 4mg /ml Dexamethasone;Moist Heat;Ultrasound;Gait training;Stair  training;Functional mobility training;Therapeutic activities;Therapeutic exercise;Balance training;Neuromuscular re-education;Patient/family education;Manual techniques;Passive range of motion;Dry needling;Taping;Vasopneumatic Device    PT Next Visit Plan D/C to HEP   PT Home Exercise Plan [place] and Agree with Plan of Care Patient             Patient will benefit from skilled therapeutic intervention in order to improve the following deficits and impairments:  Abnormal gait, Decreased range of motion, Difficulty walking, Pain, Decreased activity tolerance, Decreased balance, Impaired flexibility, Decreased mobility, Decreased strength, Increased edema, Postural dysfunction  Visit Diagnosis: Stiffness of left knee  Weakness generalized  Other symptoms and signs involving the musculoskeletal system     Problem List Patient Active Problem List   Diagnosis Date Noted   Paroxysmal atrial fibrillation ([place]) 07/15/2021   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/2021   DDD (degenerative disc disease), cervical 12/03/2020   Fibromyalgia 03/05/2020   Diverticulitis of sigmoid colon 08/01/2019   Generalized anxiety disorder with panic attacks 08/01/2019   Major depressive disorder, single episode, in remission ([place]) 09/09/2018   Non-seasonal allergic rhinitis 09/09/2018   Hyperlipidemia 09/09/2018   Essential hypertension 09/09/2018   Chronic bilateral low back pain without sciatica 10/07/2016  Chronic bilateral upper abdominal pain 10/07/2016   Primary osteoarthritis of both knees post bilateral hemiarthroplasty 10/07/2016   S/P laparoscopic fundoplication 86/57/8469   Gastroesophageal reflux disease without esophagitis 11/06/2015   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   Subarachnoid hemorrhage following injury [address], PTA 07/19/21 9:54 AM   [facility] [place]  [place], [place], [address] Phone: [phone]   Fax:  [phone]  Name: Melissa Rodgers MRN: [id number] Date of Birth: Sep 06, 1951

## 2021-07-22 DIAGNOSIS — D649 Anemia, unspecified: Secondary | ICD-10-CM | POA: Insufficient documentation

## 2021-07-25 ENCOUNTER — Other Ambulatory Visit: Payer: Self-pay

## 2021-07-25 ENCOUNTER — Encounter: Payer: Self-pay | Admitting: Rehabilitative and Restorative Service Providers"

## 2021-07-25 ENCOUNTER — Ambulatory Visit (INDEPENDENT_AMBULATORY_CARE_PROVIDER_SITE_OTHER): Payer: Medicare Other | Admitting: Rehabilitative and Restorative Service Providers"

## 2021-07-25 DIAGNOSIS — M25662 Stiffness of left knee, not elsewhere classified: Secondary | ICD-10-CM | POA: Diagnosis not present

## 2021-07-25 DIAGNOSIS — R531 Weakness: Secondary | ICD-10-CM | POA: Diagnosis not present

## 2021-07-25 NOTE — Patient Instructions (Signed)
Access Code: [place] URL: [email or web address] Date: 07/25/2021 Prepared by: Rudell Cobb  Exercises Prone Knee Flexion AROM - 1 x daily - 4 x weekly - 1 sets - 10 reps Seated Hamstring Stretch with Chair - 1 x daily - 4 x weekly - 1 sets - 2 reps - 30 seconds hold Heel Raises with Counter Support - 1 x daily - 4 x weekly - 1 sets - 10 reps Single Leg Stance with Support - 1 x daily - 4 x weekly - 1 sets - 3 reps - 10 seconds hold Sit to Stand - 1 x daily - 4 x weekly - 1 sets - 10 reps Side Step Over - 1 x daily - 4 x weekly - 1 sets - 10 reps

## 2021-07-25 NOTE — Therapy (Signed)
[place], [place], [address] Phone: [phone]   Fax:  [phone]  Physical Therapy Treatment and Discharge Summary  Patient Details  Name: Melissa Rodgers MRN: [id number] Date of Birth: 11/23/1951 Referring Provider (PT): Dr Dorna Leitz  PHYSICAL THERAPY DISCHARGE SUMMARY  Visits from Start of Care: 13  Current functional level related to goals / functional outcomes: See goals below.   Remaining deficits: Mild soreness, some continued mild weakness with HEP to address   Education / Equipment: Comprehensive HEP, post d/c walking plan.   Patient agrees to discharge. Patient goals were met. Patient is being discharged due to meeting the stated rehab goals.  Encounter Date: 07/25/2021   PT End of Session - 07/25/21 1400     Visit Number 13    Number of Visits 24    Date for PT Re-Evaluation 08/21/21    PT Start Time 1400    PT Stop Time 1444    PT Time Calculation (min) 44 min    Activity Tolerance Patient tolerated treatment well    Behavior During Therapy [facility] for tasks assessed/performed             Past Medical History:  Diagnosis Date   Anemia    Atrial fibrillation 06/18/2019   BMI 30.0-30.9,adult 01/27/2013   Candidosis of skin 04/17/2016   Formatting of this note might be different from the original. 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 01/29/15:  24 Hour Holter Monitor CONCLUSIONS: 1. Overall normal 24-hour Holter monitor recording, with rare, brief,  asymptomatic nonsustained supraventricular tachycardia as described.  2. No cardiac rhythm explanation for patient's symptoms.       Primary osteoarthritis of both knees 10/07/2016   Formatting of this note might be different from the original. 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   Primary osteoarthritis of right knee 11/21/2014   S/P laparoscopic fundoplication 11/25/5850   Scar condition and fibrosis of skin 04/17/2016   Formatting of this note might be different from the original. lower abdomen   Sebaceous hyperplasia 04/17/2016   Seborrheic keratoses 04/17/2016   Skin tags, multiple acquired 04/17/2016   Solar lentigo 04/17/2016   Subarachnoid hemorrhage following injury 2010   Fall from horse; brief hospitalization    Past Surgical History:  Procedure Laterality Date   ESOPHAGOGASTRODUODENOSCOPY     possibly last one was 2017. In [place]   HERNIA REPAIR     MEDIAL PARTIAL KNEE REPLACEMENT Right 77/82/4235   NISSEN FUNDOPLICATION  36/14/4315  OOPHORECTOMY     PARTIAL KNEE ARTHROPLASTY Left 04/05/2014   TOTAL KNEE REVISION Left 05/10/2021   Procedure: TOTAL KNEE REVISION;  Surgeon: Dorna Leitz, MD;  Location: [facility];  Service: Orthopedics;  Laterality: Left;    There were no vitals filed for this visit.   Subjective Assessment - 07/25/21 1403     Subjective The patient reports she feels the knee is fully functional, but she continues  with sore.  She walked a lot yesterday and notes more soreness this morning.  She is consistently sore every morning-- typicall morning is a 3-4/10, but it quickly reduces with movement.    Pertinent History Rt partial knee replacement 2016; a-fib; HTN; mild anxiety/depression; arthritis; fundoplication 7322; shoulder pain; neck pain    Patient Stated Goals improve flexibility and recovery from knee surgery    Currently in Pain? Yes    Pain Score 2     Pain Location Knee    Pain Orientation Left    Pain Descriptors / Indicators Sore    Pain Type Acute pain;Surgical pain    Pain Onset More than a month ago    Pain Frequency Intermittent    Aggravating Factors  varies, increased activity    Pain Relieving Factors medicine, movement                [facility] PT Assessment - 07/25/21 1414       Assessment   Medical Diagnosis Lt TKA    Referring Provider (PT) Dr Dorna Leitz    Onset Date/Surgical Date 05/10/21    Hand Dominance Left      AROM   Left Knee Extension 0    Left Knee Flexion 122                           [facility] Adult PT Treatment/Exercise - 07/25/21 1415       Ambulation/Gait   Ambulation/Gait Yes    Ambulation/Gait Assistance 7: Independent    Stairs Yes    Stairs Assistance 6: Modified independent (Device/Increase time)    Stair Management Technique Alternating pattern;Step to pattern;One rail Right;One rail Left    Number of Stairs 26    Gait Comments outdoor/community gait going up hill on grass and concrete surfaces      Therapeutic Activites    Therapeutic Activities Other Therapeutic Activities    Other Therapeutic Activities floor<>stand with mod indep      Exercises   Exercises Knee/Hip      Knee/Hip Exercises: Stretches   Active Hamstring Stretch Right;Left;30 seconds    Other Knee/Hip Stretches prone quad stretch      Knee/Hip Exercises: Aerobic   Recumbent Bike warm up x 3 minutes      Knee/Hip Exercises: Standing   Knee  Flexion Strengthening;Left;10 reps    Hip Flexion Stengthening;Right;Left;10 reps    Hip Flexion Limitations 3 sets marching 20 feet    SLS single leg stance L side    Other Standing Knee Exercises sidestep with i ncreased step height (imaginary shoe box)      Knee/Hip Exercises: Prone   Hamstring Curl 5 reps    Hamstring Curl Limitations did while practicing getting on the floor on a yoga mat and return                      PT Short Term Goals - 07/01/21 1659       PT SHORT TERM GOAL #1  Title Independent in inital HEP    Time 6    Period Weeks    Status Achieved    Target Date 07/10/21      PT SHORT TERM GOAL #2   Title normal gait pattern with cane to no assistive device    Time 6    Period Weeks    Status Achieved    Target Date 07/10/21      PT SHORT TERM GOAL #3   Title AROM Lt knee -3 deg extension to 100 deg flexion    Baseline 4 to 112 degrees on 7/26    Time 6    Period Weeks    Status Achieved    Target Date 07/10/21               PT Long Term Goals - 07/25/21 1406       PT LONG TERM GOAL #1   Title The patient will be indep with HEP including community aquatic pool program as appropriate    Baseline has land HEP    Time 12    Period Weeks    Status Partially Met      PT LONG TERM GOAL #2   Title 5/5 strength Lt LE    Baseline -    Time 12    Period Weeks    Status Partially Met      PT LONG TERM GOAL #3   Title Patient reports return to normal functional activities including walking for 20 min without increased symptoms    Baseline -No pain--during activity, sore afterwards    Time 12    Period Weeks    Status Achieved      PT LONG TERM GOAL #4   Title AROM Lt knee 0 deg extension to 110 deg flexion    Baseline -    Time 12    Period Weeks    Status Achieved      PT LONG TERM GOAL #5   Title Improve functional limitation score to 53    Time 12    Period Weeks    Status Achieved                   Plan  - 07/25/21 1704     Clinical Impression Statement The patient has partially met LTGs.  She feels confident with updated HEP and reports continued mild soreness, but full function with knee.    PT Treatment/Interventions ADLs/Self Care Home Management;Aquatic Therapy;Cryotherapy;Electrical Stimulation;Iontophoresis 4mg /ml Dexamethasone;Moist Heat;Ultrasound;Gait training;Stair training;Functional mobility training;Therapeutic activities;Therapeutic exercise;Balance training;Neuromuscular re-education;Patient/family education;Manual techniques;Passive range of motion;Dry needling;Taping;Vasopneumatic Device    PT Next Visit Plan discharge    PT Home Exercise Plan [place] and Agree with Plan of Care Patient             Patient will benefit from skilled therapeutic intervention in order to improve the following deficits and impairments:     Visit Diagnosis: Stiffness of left knee  Weakness generalized     Problem List Patient Active Problem List   Diagnosis Date Noted   Anemia 07/22/2021   Paroxysmal atrial fibrillation ([place]) 07/15/2021   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[place]) 05/10/2021   DDD (degenerative disc disease), cervical 12/03/2020   Fibromyalgia 03/05/2020   Diverticulitis of sigmoid colon 08/01/2019   Generalized anxiety disorder with panic attacks 08/01/2019   Major depressive disorder, single episode, in remission ([place]) 09/09/2018   Non-seasonal allergic rhinitis 09/09/2018   Hyperlipidemia 09/09/2018  Essential hypertension 09/09/2018   Chronic bilateral low back pain without sciatica 10/07/2016   Chronic bilateral upper abdominal pain 10/07/2016   Primary osteoarthritis of both knees post bilateral hemiarthroplasty 10/07/2016   Atrial fibrillation 04/17/2016   S/P laparoscopic fundoplication 25/18/9842   Gastroesophageal reflux disease without esophagitis 11/06/2015   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   Subarachnoid  hemorrhage following injury 2010   Thank you for the referral of this patient. Rudell Cobb, MPT  Holiday [place]men[place] 07/25/2021, 6:57 PM  [facility] [place], [place], [address] Phone: [phone]   Fax:  [phone]  Name: Melissa Rodgers MRN: [id number] Date of Birth: [birth year]

## 2021-07-26 ENCOUNTER — Encounter: Payer: Self-pay | Admitting: Cardiology

## 2021-07-26 ENCOUNTER — Ambulatory Visit (INDEPENDENT_AMBULATORY_CARE_PROVIDER_SITE_OTHER): Payer: Medicare Other | Admitting: Cardiology

## 2021-07-26 VITALS — BP 140/80 | HR 64 | Ht 63.0 in | Wt 178.0 lb

## 2021-07-26 DIAGNOSIS — Z7901 Long term (current) use of anticoagulants: Secondary | ICD-10-CM

## 2021-07-26 DIAGNOSIS — I480 Paroxysmal atrial fibrillation: Secondary | ICD-10-CM

## 2021-07-26 DIAGNOSIS — I10 Essential (primary) hypertension: Secondary | ICD-10-CM | POA: Diagnosis not present

## 2021-07-26 DIAGNOSIS — Z79899 Other long term (current) drug therapy: Secondary | ICD-10-CM

## 2021-07-26 MED ORDER — METOPROLOL SUCCINATE ER 25 MG PO TB24: 12.5000 mg | ORAL_TABLET | Freq: Every day | ORAL | 3 refills | Status: DC

## 2021-07-26 NOTE — Progress Notes (Signed)
Cardiology Office Note:    Date:  07/26/2021   ID:  Melissa Rodgers, DOB March 08, 1951, MRN [id number]  PCP:  [name], DO  Cardiologist:  Shirlee More, MD    Referring MD: [name], DO    ASSESSMENT:    1. PAF (paroxysmal atrial fibrillation) ([place])   2. Chronic anticoagulation   3. High risk medication use   4. Essential hypertension    PLAN:    In order of problems listed above:  She is stable from atrial fibrillation maintaining sinus rhythm continue flecainide no finding of toxicity reduce beta-blocker continue her anticoagulant. Home blood pressures consistently in range off ARB had to keep her off for the time being may need to resume. Her other complaints I think are unrelated to her atrial fibrillation and her medications.   Next appointment: 6 months   Medication Adjustments/Labs and Tests Ordered: Current medicines are reviewed at length with the patient today.  Concerns regarding medicines are outlined above.  No orders of the defined types were placed in this encounter.  No orders of the defined types were placed in this encounter.   Follow-up atrial fibrillation   History of Present Illness:    Melissa Rodgers is a 70 y.o. female with a hx of paroxysmal atrial fibrillation maintaining sinus rhythm on flecainide and anticoagulated and hypertension last seen 05/02/2021 and preoperative cardiology evaluation prior to total knee arthroplasty revision.  Compliance with diet, lifestyle and medications: Yes  From my perspective she is doing well no recurrent atrial fibrillation. She has many questions the first is about her echocardiogram and aortic valve sclerosis I explained that the mild changes seen in the valves is due to age. She struggled after surgery showed a wall thrush a profound weakness she had pain but is gone through physical therapy and improved she had blood pressures less than [id number] systolic she is off her ARB.  She is feeling better  and asked me she should stop her beta-blocker her resting heart rate is in the 6 And I told her we should just increase the dose.  Is important to have background rate suppression with flecainide.  Her symptoms are unrelated to the drug. She also has a little bit of dependent edema likely related to gabapentin She has about her coronary calcium score and taking a statin I told her generally individuals score of 0 not diabetic do not require lipid-lowering treatment  05/03/2024 her echocardiogram shows normal cardiac function no findings of heart failure.    1. Left ventricular ejection fraction, by estimation, is 60 to 65%. The left ventricle has normal function. The left ventricle has no regional wall motion abnormalities. Left ventricular diastolic parameters are consistent with Grade I diastolic dysfunction (impaired relaxation).  2. Right ventricular systolic function is normal. The right ventricular size is normal. There is normal pulmonary artery systolic pressure. The estimated right ventricular systolic pressure is 99991111 mmHg.  3. The mitral valve is normal in structure. Mild mitral valve regurgitation. No evidence of mitral stenosis.  4. The aortic valve is normal in structure. Aortic valve regurgitation is mild. Mild aortic valve sclerosis is present, with no evidence of aortic valve stenosis. Aortic regurgitation PHT measures 748 msec.  5. The inferior vena cava is normal in size with greater than 50% respiratory variability, suggesting right atrial pressure of 3 mmHg.  Cardiac CT 01/21/2018 with a calcium score of 0 and normal coronary arteries  Past Medical History:  Diagnosis Date   Anemia  Atrial fibrillation 06/18/2019   BMI 30.0-30.9,adult 01/27/2013   Candidosis of skin 04/17/2016   Formatting of this note might be different from the original. lower abdomen   Cherry angioma 04/17/2016   Chronic bilateral low back pain without sciatica 10/07/2016   Chronic bilateral upper  abdominal pain 10/07/2016   Paraesophageal hiatal hernia with organoaxial rotation s/p repair 01/2016.   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 01/29/15:  24 Hour Holter Monitor CONCLUSIONS: 1. Overall normal 24-hour Holter monitor recording, with rare, brief,  asymptomatic nonsustained supraventricular tachycardia as described.  2. No cardiac rhythm explanation for patient's symptoms.       Primary osteoarthritis of both knees 10/07/2016   Formatting of this note might be different from the original. 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   Primary osteoarthritis of right knee 11/21/2014   S/P laparoscopic fundoplication [id number]   Scar condition and fibrosis of skin 04/17/2016   Formatting of this note might be different from the original. lower abdomen   Sebaceous hyperplasia 04/17/2016   Seborrheic keratoses 04/17/2016   Skin tags, multiple acquired 04/17/2016   Solar lentigo 04/17/2016   Subarachnoid hemorrhage following injury 2010   Fall from horse; brief hospitalization    Past Surgical History:  Procedure Laterality Date   ESOPHAGOGASTRODUODENOSCOPY     possibly last one was 2017. In [place]   HERNIA REPAIR     MEDIAL PARTIAL KNEE REPLACEMENT  Right 0000000   NISSEN FUNDOPLICATION  [id number]   OOPHORECTOMY     PARTIAL KNEE ARTHROPLASTY Left 04/05/2014   TOTAL KNEE REVISION Left 05/10/2021   Procedure: TOTAL KNEE REVISION;  Surgeon: Dorna Leitz, MD;  Location: [facility];  Service: Orthopedics;  Laterality: Left;    Current Medications: Current Meds  Medication Sig   apixaban (ELIQUIS) 5 MG TABS tablet Take 5 mg by mouth 2 (two) times daily.   cetirizine (ZYRTEC) 10 MG tablet Take 10 mg by mouth daily.   Cholecalciferol (VITAMIN D-3) 1000 units CAPS Take 1,000 Units by mouth daily.   clonazePAM (KLONOPIN) 0.5 MG tablet Take 0.5 mg by mouth daily as needed for anxiety.   Cyanocobalamin (B-12 PO) Take 1 tablet by mouth daily.   diltiazem (CARDIZEM) 30 MG tablet Take 1 tablet (30 mg total) by mouth every 4 (four) hours as needed. (Patient taking differently: Take 30 mg by mouth every 4 (four) hours as needed (Atrial Fib).)   docusate sodium (COLACE) 100 MG capsule Take 1 capsule (100 mg total) by mouth 2 (two) times daily.   flecainide (TAMBOCOR) 50 MG tablet TAKE 1 TABLET(50 MG) BY MOUTH TWICE DAILY   FLUoxetine (PROZAC) 10 MG tablet Take 10 mg by mouth daily.   gabapentin (NEURONTIN) 300 MG capsule Take 1 capsule (300 mg  total) by mouth 2 (two) times daily.   HYDROcodone-acetaminophen (NORCO/VICODIN) 5-325 MG tablet Take 1 tablet by mouth every 6 (six) hours as needed for moderate pain.   metoprolol succinate (TOPROL-XL) 25 MG 24 hr tablet TAKE 1 TABLET(25 MG) BY MOUTH DAILY   mupirocin nasal ointment (BACTROBAN) 2 % Place 1 application into the nose 2 (two) times daily. Apply to both nares BID for 5 days.     Allergies:   Rosuvastatin and Sulfa antibiotics   Social History   Socioeconomic History   Marital status: Married    Spouse name: Kirtland Bouchard   Number of children: 0   Years of education: 18   Highest education level: Master's degree (e.g., MA, MS, MEng, MEd, MSW, MBA)  Occupational History   Occupation: Retired   Tobacco Use   Smoking status: Former    Types: Cigarettes    Quit date: 08/18/1983    Years since quitting: 37.9   Smokeless tobacco: Never  Vaping Use   Vaping Use: Never used  Substance and Sexual Activity   Alcohol use: Yes    Alcohol/week: 7.0 - 14.0 standard drinks    Types: 7 - 14 Glasses of wine per week    Comment: 1-2 drinks per night, usually wine   Drug use: Never   Sexual activity: Yes    Partners: Male    Birth control/protection: None  Other Topics Concern   Not on file  Social History Narrative   Lives with her husband. Stays active and has a lot of hobbies such as gardening, cooking, reading and doing crafts.   Social Determinants of Health   Financial Resource Strain: Low Risk    Difficulty of Paying Living Expenses: Not hard at all  Food Insecurity: No Food Insecurity   Worried About Charity fundraiser in the Last Year: Never true   [place] in the Last Year: Never true  Transportation Needs: No Transportation Needs   Lack of Transportation (Medical): No   Lack of Transportation (Non-Medical): No  Physical Activity: Inactive   Days of Exercise per Week: 0 days   Minutes of Exercise per Session: 0 min  Stress: No Stress Concern Present   Feeling of Stress : Not at all  Social Connections: Moderately Isolated   Frequency of Communication with Friends and Family: More than three times a week   Frequency of Social Gatherings with Friends and Family: Once a week   Attends Religious Services: Never   Marine scientist or Organizations: No   Attends Music therapist: Never   Marital Status: Married     Family History: The patient's family history includes Alzheimer's disease in her father; Atrial fibrillation in her sister; Diabetes in her mother; Heart disease in her mother; Tremor in an other family member. There is no history of Colon cancer or Esophageal cancer. ROS:   Please see the history of present illness.    All other  systems reviewed and are negative.  EKGs/Labs/Other Studies Reviewed:    The following studies were reviewed today:  EKG:  EKG ordered today and personally reviewed.  The ekg ordered today demonstrates sinus rhythm nonspecific ST changes no indication of 1C antiarrhythmic drug toxicity  Recent Labs: 05/02/2021: NT-Pro BNP 48 07/01/2021: ALT 15; BUN 14; Creat 0.50; Hemoglobin 13.6; Platelets 362; Potassium 4.4; Sodium 139; TSH 2.16  Recent Lipid Panel    Component Value Date/Time   CHOL 204 (H) 07/01/2021 0000   TRIG 356 (  H) 07/01/2021 0000   HDL 42 (L) 07/01/2021 0000   CHOLHDL 4.9 07/01/2021 0000   LDLCALC 111 (H) 07/01/2021 0000    Physical Exam:    VS:  BP 140/80 (BP Location: Right Arm, Patient Position: Sitting, Cuff Size: Normal)   Pulse 64   Ht '5\' 3"'$  (1.6 m)   Wt 178 lb (80.7 kg)   SpO2 96%   BMI 31.53 kg/m     Wt Readings from Last 3 Encounters:  07/26/21 178 lb (80.7 kg)  07/15/21 177 lb (80.3 kg)  07/01/21 178 lb 0.6 oz (80.8 kg)     GEN:  Well nourished, well developed in no acute distress HEENT: Normal NECK: No JVD; No carotid bruits LYMPHATICS: No lymphadenopathy CARDIAC: RRR, no murmurs, rubs, gallops RESPIRATORY:  Clear to auscultation without rales, wheezing or rhonchi  ABDOMEN: Soft, non-tender, non-distended MUSCULOSKELETAL:  No edema; No deformity  SKIN: Warm and dry NEUROLOGIC:  Alert and oriented x 3 PSYCHIATRIC:  Normal affect    Signed, Shirlee More, MD  07/26/2021 9:34 AM    [place]

## 2021-07-26 NOTE — Patient Instructions (Addendum)
Medication Instructions:  Your physician has recommended you make the following change in your medication:   Decrease your Metoprolol succinate (Toprol XL) to 12.5 mg daily.  *If you need a refill on your cardiac medications before your next appointment, please call your pharmacy*   Lab Work: None ordered If you have labs (blood work) drawn today and your tests are completely normal, you will receive your results only by: [place] (if you have MyChart) OR A paper copy in the mail If you have any lab test that is abnormal or we need to change your treatment, we will call you to review the results.   Testing/Procedures: None ordered   Follow-Up: At [facility], you and your health needs are our priority.  As part of our continuing mission to provide you with exceptional heart care, we have created designated Provider Care Teams.  These Care Teams include your primary Cardiologist (physician) and Advanced Practice Providers (APPs -  Physician Assistants and Nurse Practitioners) who all work together to provide you with the care you need, when you need it.  We recommend signing up for the patient portal called "MyChart".  Sign up information is provided on this After Visit Summary.  MyChart is used to connect with patients for Virtual Visits (Telemedicine).  Patients are able to view lab/test results, encounter notes, upcoming appointments, etc.  Non-urgent messages can be sent to your provider as well.   To learn more about what you can do with MyChart, go to [email or web address].    Your next appointment:   6 month(s)  The format for your next appointment:   In Person  Provider:   Jyl Heinz, MD   Other Instructions NA

## 2021-07-31 DIAGNOSIS — Z23 Encounter for immunization: Secondary | ICD-10-CM | POA: Diagnosis not present

## 2021-09-12 ENCOUNTER — Ambulatory Visit: Payer: Medicare Other | Admitting: Gastroenterology

## 2021-09-13 DIAGNOSIS — Z23 Encounter for immunization: Secondary | ICD-10-CM | POA: Diagnosis not present

## 2021-09-17 ENCOUNTER — Other Ambulatory Visit: Payer: Self-pay

## 2021-09-17 ENCOUNTER — Encounter: Payer: Self-pay | Admitting: Gastroenterology

## 2021-09-17 ENCOUNTER — Ambulatory Visit (INDEPENDENT_AMBULATORY_CARE_PROVIDER_SITE_OTHER): Payer: Medicare Other | Admitting: Gastroenterology

## 2021-09-17 ENCOUNTER — Telehealth: Payer: Self-pay | Admitting: General Surgery

## 2021-09-17 VITALS — BP 120/82 | HR 65 | Ht 63.0 in | Wt 178.4 lb

## 2021-09-17 DIAGNOSIS — K5900 Constipation, unspecified: Secondary | ICD-10-CM | POA: Diagnosis not present

## 2021-09-17 DIAGNOSIS — Z9889 Other specified postprocedural states: Secondary | ICD-10-CM | POA: Diagnosis not present

## 2021-09-17 DIAGNOSIS — R1012 Left upper quadrant pain: Secondary | ICD-10-CM

## 2021-09-17 DIAGNOSIS — K581 Irritable bowel syndrome with constipation: Secondary | ICD-10-CM | POA: Diagnosis not present

## 2021-09-17 DIAGNOSIS — I480 Paroxysmal atrial fibrillation: Secondary | ICD-10-CM | POA: Diagnosis not present

## 2021-09-17 DIAGNOSIS — Z7901 Long term (current) use of anticoagulants: Secondary | ICD-10-CM | POA: Diagnosis not present

## 2021-09-17 MED ORDER — LINACLOTIDE 72 MCG PO CAPS
72.0000 ug | ORAL_CAPSULE | Freq: Every day | ORAL | 0 refills | Status: DC
Start: 2021-09-17 — End: 2021-09-26

## 2021-09-17 NOTE — Telephone Encounter (Signed)
   Name: Melissa Rodgers  DOB: Sep 25, 1951  MRN: [id number]   Primary Cardiologist: Shirlee More, MD  Chart reviewed as part of pre-operative protocol coverage. Patient was contacted 09/17/2021 in reference to pre-operative risk assessment for pending surgery as outlined below.  Melissa Rodgers was last seen 07/2021. I reached out to patient for update on how she is doing. The patient affirms she has been doing well without any new cardiac symptoms. Therefore, based on ACC/AHA guidelines, the patient would be at acceptable risk for the planned procedure without further cardiovascular testing. The patient was advised that if she develops new symptoms prior to surgery to contact our office to arrange for a follow-up visit, and she verbalized understanding.  Pharmacy team has reviewed anticoagulation request. Per office protocol, patient can hold Eliquis for 2 days prior to procedure.    I will route this recommendation to the requesting party via Epic fax function and remove from pre-op pool. Please call with questions.  Charlie Pitter, PA-C 09/17/2021, 2:45 PM

## 2021-09-17 NOTE — Telephone Encounter (Signed)
Patient with diagnosis of afib on Eliquis for anticoagulation.    Procedure: endoscopy Date of procedure: 10/08/21  CHA2DS2-VASc Score = 3  This indicates a 3.2% annual risk of stroke. The patient's score is based upon: CHF History: 0 HTN History: 1 Diabetes History: 0 Stroke History: 0 Vascular Disease History: 0 Age Score: 1 Gender Score: 1   CrCl >168mL/min Platelet count 362K  Per office protocol, patient can hold Eliquis for 2 days prior to procedure.

## 2021-09-17 NOTE — Progress Notes (Signed)
Chief Complaint:    IBS, abdominal pain  GI History: Melissa Rodgers is a 70 y.o. female with a history of paroxysmal atrial fibrillation (flecainide, Eliquis), hypertension, GERD, low back pain, who follows in the GI Clinic for diverticular disease, IBS, and GERD.  Previously followed with [facility], established care with me in 02/2020.   1) Diverticulosis: Patient with episode of diverticulitis in 07/2019 -CT 07/2019: Severe, acute sigmoid diverticulitis; treated with antibiotics -Colonoscopy 09/2019 ([facility]): moderate diverticulosis of the descending and sigmoid colon, lipoma, internal hemorrhoids, and inflammatory polyp of the sigmoid colon -CT 12/05/2019: Normal, resolution of sigmoid diverticulitis.  Treated with 14-day course of Augmentin for LLQ tenderness -CT 01/17/2020: Mild diverticulosis without diverticulitis, stable uterine fibroid   2) IBS-C:  Intermittent abdominal cramping and constipation, with occasional overflow diarrhea.  Improved with MiraLAX and Benefiber.  Uses Gas-X for gas/bloating.  History of epigastric pain resolved with clonazepam as needed.   3) GERD: History of GERD, with previous hiatal hernia repair and Nissen fundoplication in 9030.  Reflux now well controlled, no longer needs any acid suppression therapy.  HPI:     Patient is a 70 y.o. female presenting to the Gastroenterology Clinic for follow-up.  Last seen by me on 07/17/2020.  Main issue at that time was constipation which was well controlled with MiraLAX daily, but breakthrough of abdominal pain/constipation with any missed dose.  Today, her main issue is intermittent LUQ pain.  Pain tends to be postprandial.  No reguritation but occasional HB, and she is concerned about slipped Nissen wrap.  Some improvement with Mylanta  prn, but otherwise not taking any PPI, H2 blockers.  Has used half tablet of clonazepam with improvement as well.  She very much wants EGD to ensure no significant pathology. No dysphagia.   Weight stable.  Otherwise, constipation largely well controlled.  Titrated off MiraLAX and now controlled with Colace daily and Gas-X as needed for bloating, but inquires about alternate treatment options.   Review of systems:     No chest pain, no SOB, no fevers, no urinary sx   Past Medical History:  Diagnosis Date   Anemia    Atrial fibrillation 06/18/2019   BMI 30.0-30.9,adult 01/27/2013   Candidosis of skin 04/17/2016   Formatting of this note might be different from the original. 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 01/29/15:  24 Hour Holter Monitor CONCLUSIONS: 1. Overall normal 24-hour Holter monitor recording, with rare, brief,  asymptomatic nonsustained supraventricular tachycardia as described.  2. No cardiac rhythm explanation for patient's symptoms.  Primary osteoarthritis of both knees 10/07/2016   Formatting of this note might be different from the original. 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    Primary osteoarthritis of right knee 11/21/2014   S/P laparoscopic fundoplication 7/89/3810   Scar condition and fibrosis of skin 04/17/2016   Formatting of this note might be different from the original. lower abdomen   Sebaceous hyperplasia 04/17/2016   Seborrheic keratoses 04/17/2016   Skin tags, multiple acquired 04/17/2016   Solar lentigo 04/17/2016   Subarachnoid hemorrhage following injury 2010   Fall from horse; brief hospitalization    Patient's surgical history, family medical history, social history, medications and allergies were all reviewed in Epic    Current Outpatient Medications  Medication Sig Dispense Refill   apixaban (ELIQUIS) 5 MG TABS tablet Take 5 mg by mouth 2 (two) times daily.     cetirizine (ZYRTEC) 10 MG tablet Take 10 mg by mouth daily.     Cholecalciferol (VITAMIN D-3) 1000 units CAPS Take 1,000 Units by mouth daily.     clonazePAM (KLONOPIN) 0.5 MG tablet Take 0.5 mg by mouth daily as needed for anxiety.     Cyanocobalamin (B-12 PO) Take 1 tablet by mouth daily.     diltiazem (CARDIZEM) 30 MG tablet Take 1 tablet (30 mg total) by mouth every 4 (four) hours as needed. (Patient taking differently: Take 30 mg by mouth every 4 (four) hours as needed (Atrial Fib).) 90 tablet 3   docusate sodium (COLACE) 100 MG capsule Take 1 capsule (100 mg total) by mouth 2 (two) times daily. 30 capsule 0   flecainide (TAMBOCOR) 50 MG tablet TAKE 1 TABLET(50 MG) BY MOUTH TWICE DAILY 180 tablet 2   FLUoxetine (PROZAC) 10 MG tablet Take 10 mg by mouth daily.     HYDROcodone-acetaminophen (NORCO/VICODIN) 5-325 MG tablet Take 1 tablet by mouth every 6 (six) hours as needed for moderate pain.     metoprolol succinate (TOPROL-XL) 25 MG 24 hr tablet Take 0.5 tablets (12.5 mg total) by mouth daily. 45 tablet 3   No current facility-administered medications for this visit.    Physical Exam:     BP 120/82   Pulse 65   Ht 5\' 3"  (1.6 m)   Wt 178 lb 6 oz (80.9 kg)   SpO2 97%   BMI  31.60 kg/m   GENERAL:  Pleasant female in NAD PSYCH: : Cooperative, normal affect EENT:  conjunctiva pink, mucous membranes moist, neck supple without masses CARDIAC:  RRR, no murmur heard, no peripheral edema PULM: Normal respiratory effort, lungs CTA bilaterally, no wheezing ABDOMEN: Mild TTP in LUQ.  No rebound, guarding, peritoneal signs.  Nondistended, soft. No obvious masses, no hepatomegaly,  normal bowel sounds SKIN:  turgor, no lesions seen Musculoskeletal:  Normal muscle tone, normal strength NEURO: Alert and oriented x 3, no focal neurologic deficits   IMPRESSION and PLAN:    1) LUQ pain 2) History of Nissen fundoplication 3) History of lap hiatal hernia repair - EGD to evaluate for wrap integrity, gastritis, PUD, erosive esophagitis, hernia recurrence, or other mucosal/luminal pathology - Continue conservative management for now - Diet diary to try to identify potential triggers  4) IBS-C - Per patient request, trial course of Linzess 72 mcg/day - Counseled on ADR profile of Linzess, to include diarrhea and fecal urgency in the first 7-10 days - To take on empty stomach in the morning to reduce some of the ADR profile - Continue adequate  hydration  5) Atrial fibrillation 6) Chronic anticoagulation -Hold Eliquis 2 days before procedure - will instruct when and how to resume after procedure. Low but real risk of cardiovascular event such as heart attack, stroke, embolism, thrombosis or ischemia/infarct of other organs off Eliquis explained and need to seek urgent help if this occurs. The patient consents to proceed. Will communicate by phone or EMR with patient's prescribing provider to confirm that holding Eliquis is reasonable in this case    The indications, risks, and benefits of EGD were explained to the patient in detail. Risks include but are not limited to bleeding, perforation, adverse reaction to medications, and cardiopulmonary compromise. Sequelae include but  are not limited to the possibility of surgery, hospitalization, and mortality. The patient verbalized understanding and wished to proceed. All questions answered, referred to scheduler. Further recommendations pending results of the exam.             [place] ,DO, FACG 09/17/2021, 10:02 AM

## 2021-09-17 NOTE — Telephone Encounter (Signed)
Covering preop today. Last OV 07/2021. Will route to pharm then pt will need call.

## 2021-09-17 NOTE — Telephone Encounter (Signed)
[facility] Pre-operative Risk Assessment     Request for surgical clearance:     Endoscopy Procedure  What type of surgery is being performed?     Endoscopy  When is this surgery scheduled?     10/08/21  What type of clearance is required ?   Pharmacy  Are there any medications that need to be held prior to surgery and how long? Eliquis for 2 days  Practice name and name of physician performing surgery?      [facility]  What is your office phone and fax number?      Phone- [phone]  [phone]  Anesthesia type (None, local, MAC, general)       MAC

## 2021-09-17 NOTE — Patient Instructions (Signed)
If you are age 71 or older, your body mass index should be between 23-30. Your Body mass index is 31.6 kg/m. If this is out of the aforementioned range listed, please consider follow up with your Primary Care Provider.  If you are age 60 or younger, your body mass index should be between 19-25. Your Body mass index is 31.6 kg/m. If this is out of the aformentioned range listed, please consider follow up with your Primary Care Provider.   __________________________________________________________  The [facility] providers would like to encourage you to use [facility] to communicate with providers for non-urgent requests or questions.  Due to long hold times on the telephone, sending your provider a message by [facility] may be a faster and more efficient way to get a response.  Please allow 48 business hours for a response.  Please remember that this is for non-urgent requests.   We have sent the following medications to your pharmacy for you to pick up at your convenience:  Linzess 31mcg daily  Due to recent changes in healthcare laws, you may see the results of your imaging and laboratory studies on MyChart before your provider has had a chance to review them.  We understand that in some cases there may be results that are confusing or concerning to you. Not all laboratory results come back in the same time frame and the provider may be waiting for multiple results in order to interpret others.  Please give [place] 48 hours in order for your provider to thoroughly review all the results before contacting the office for clarification of your results.   Thank you for choosing me and [facility].  [name], D.O.

## 2021-09-18 NOTE — Telephone Encounter (Signed)
Contacted the patient and left a voicemail to stop her Eliquis 2 days prior to her procedure.

## 2021-09-24 ENCOUNTER — Encounter: Payer: Self-pay | Admitting: Medical-Surgical

## 2021-09-24 DIAGNOSIS — F410 Panic disorder [episodic paroxysmal anxiety] without agoraphobia: Secondary | ICD-10-CM

## 2021-09-24 DIAGNOSIS — M25562 Pain in left knee: Secondary | ICD-10-CM | POA: Diagnosis not present

## 2021-09-24 DIAGNOSIS — M25561 Pain in right knee: Secondary | ICD-10-CM | POA: Diagnosis not present

## 2021-09-24 MED ORDER — CLONAZEPAM 0.5 MG PO TABS: 0.2500 mg | ORAL_TABLET | Freq: Every day | ORAL | 0 refills | Status: DC | PRN

## 2021-09-24 NOTE — Telephone Encounter (Signed)
Patient to establish with new PCP next month, but states she does not have enough klonopin to make it to that appointment. PDMP reviewed. Last refill was in January - she is using very sparingly. Will send in 10 tablets for her to use until she can be seen in office.   Purcell Nails Olevia Bowens, DNP, FNP-C

## 2021-09-26 ENCOUNTER — Other Ambulatory Visit: Payer: Self-pay

## 2021-09-26 ENCOUNTER — Encounter: Payer: Self-pay | Admitting: Medical-Surgical

## 2021-09-26 ENCOUNTER — Ambulatory Visit (INDEPENDENT_AMBULATORY_CARE_PROVIDER_SITE_OTHER): Payer: Medicare Other

## 2021-09-26 ENCOUNTER — Ambulatory Visit (INDEPENDENT_AMBULATORY_CARE_PROVIDER_SITE_OTHER): Payer: Medicare Other | Admitting: Medical-Surgical

## 2021-09-26 VITALS — BP 134/85 | HR 60 | Resp 20 | Ht 63.0 in | Wt 178.0 lb

## 2021-09-26 DIAGNOSIS — I480 Paroxysmal atrial fibrillation: Secondary | ICD-10-CM | POA: Diagnosis not present

## 2021-09-26 DIAGNOSIS — Z7689 Persons encountering health services in other specified circumstances: Secondary | ICD-10-CM

## 2021-09-26 DIAGNOSIS — E785 Hyperlipidemia, unspecified: Secondary | ICD-10-CM

## 2021-09-26 DIAGNOSIS — F410 Panic disorder [episodic paroxysmal anxiety] without agoraphobia: Secondary | ICD-10-CM

## 2021-09-26 DIAGNOSIS — I10 Essential (primary) hypertension: Secondary | ICD-10-CM

## 2021-09-26 DIAGNOSIS — F325 Major depressive disorder, single episode, in full remission: Secondary | ICD-10-CM | POA: Diagnosis not present

## 2021-09-26 DIAGNOSIS — F411 Generalized anxiety disorder: Secondary | ICD-10-CM | POA: Diagnosis not present

## 2021-09-26 DIAGNOSIS — R059 Cough, unspecified: Secondary | ICD-10-CM | POA: Diagnosis not present

## 2021-09-26 DIAGNOSIS — R051 Acute cough: Secondary | ICD-10-CM | POA: Diagnosis not present

## 2021-09-26 NOTE — Progress Notes (Signed)
  HPI with pertinent ROS:   CC: Transfer of care  HPI: Pleasant 70 year old female presenting today to transfer care to a new PCP and for the following:  Cough-Notes that on September 28, she tested positive for COVID although she did not have any of the typical COVID symptoms.  She has had a dry cough that comes and goes as well as chest heaviness since then.  Also developed some tenderness across the chest bilaterally at the upper part of the sternum but this has resolved.  Fatigue-she is seen Dr. Sheppard Coil for this before and her lab work was unrevealing.  She was advised to stop the losartan which she reports has helped some and that her energy got better.  She also decreased her metoprolol to half of a tablet since her blood pressures have been running low.  Unfortunately she is now having some palpitations and feels that she may need to increase her dose again.  Anxiety-taking Fluoxetine 10mg  daily, tolerating well without side effects. This works well for depression but anxiety is the worst of her symptoms. She has not tried a higher dose of Fluoxetine. Has been on several other medications in the past and not interested in trying a new medicine. Take Clonazepam 1/4 to 1/2 tablet daily as needed. Up until about 2 weeks ago, was only using this about 3 times weekly. Over the last two weeks, has been waking with a jittery feeling in her stomach that progresses to anxiety in a bit of a vicious cycle. She has been taking the Clonazepam daily for at least the last 10 days. Does have some known GI issues and has an upcoming appointment for an endoscopy. Unsure if her AM stomach symptoms are related to this or not.   I reviewed the past medical history, family history, social history, surgical history, and allergies today and no changes were needed.  Please see the problem list section below in epic for further details.   Physical exam:   General: Well Developed, well nourished, and in no acute  distress.  Neuro: Alert and oriented x3.  HEENT: Normocephalic, atraumatic. Skin: Warm and dry. Cardiac: Regular rate and rhythm, no murmurs rubs or gallops, no lower extremity edema.  Respiratory: Clear to auscultation bilaterally. Not using accessory muscles, speaking in full sentences.  Impression and Recommendations:    1. Encounter to establish care Reviewed available information and discussed care concerns with patient.   2. Generalized anxiety disorder with panic attacks 3. Major depressive disorder, single episode, in remission ([place]) Increasing fluoxetine to 20 mg daily.  Discussed very sparing use of clonazepam no more than 3 times weekly as needed.  Reviewed physical and psychological dependence that is possible on this medication especially with long-term use.  4. Paroxysmal atrial fibrillation ([place]) Managed by cardiology.  Continue medications as prescribed.  5. Essential hypertension Okay to increase metoprolol to the full tablet as long as close monitoring of blood pressure occurs.  Cardiology will have the final say in this.  6. Hyperlipidemia, unspecified hyperlipidemia type Not currently on medication for management despite recommendations.  We will revisit this at her follow-up visit  7. Acute cough Likely sequela of COVID.  We will go ahead and get a chest x-ray for further evaluation. - DG Chest 2 View; Future  Return in about 4 weeks (around 10/24/2021) for mood follow up. ___________________________________________ Clearnce Sorrel, DNP, APRN, FNP-BC Primary Care and [place]

## 2021-10-05 ENCOUNTER — Other Ambulatory Visit: Payer: Self-pay | Admitting: Cardiology

## 2021-10-07 NOTE — Telephone Encounter (Signed)
This encounter was created in error - please disregard.

## 2021-10-08 ENCOUNTER — Other Ambulatory Visit: Payer: Self-pay

## 2021-10-08 ENCOUNTER — Ambulatory Visit (AMBULATORY_SURGERY_CENTER): Payer: Medicare Other | Admitting: Gastroenterology

## 2021-10-08 ENCOUNTER — Encounter: Payer: Self-pay | Admitting: Gastroenterology

## 2021-10-08 VITALS — BP 125/68 | HR 52 | Temp 97.7°F | Resp 17 | Ht 63.0 in | Wt 178.0 lb

## 2021-10-08 DIAGNOSIS — R1012 Left upper quadrant pain: Secondary | ICD-10-CM | POA: Diagnosis not present

## 2021-10-08 DIAGNOSIS — Z9889 Other specified postprocedural states: Secondary | ICD-10-CM

## 2021-10-08 DIAGNOSIS — K2990 Gastroduodenitis, unspecified, without bleeding: Secondary | ICD-10-CM | POA: Diagnosis not present

## 2021-10-08 DIAGNOSIS — K2970 Gastritis, unspecified, without bleeding: Secondary | ICD-10-CM | POA: Diagnosis not present

## 2021-10-08 MED ORDER — SODIUM CHLORIDE 0.9 % IV SOLN: 500.0000 mL | Freq: Once | INTRAVENOUS | Status: DC

## 2021-10-08 NOTE — Progress Notes (Signed)
Called to room to assist during endoscopic procedure.  Patient ID and intended procedure confirmed with present staff. Received instructions for my participation in the procedure from the performing physician.  

## 2021-10-08 NOTE — Patient Instructions (Signed)
Thank you for allowing [place] to take care of your healthcare needs.  Continue your current medications.  Please review handouts given on gastritis.    YOU HAD AN ENDOSCOPIC PROCEDURE TODAY AT [facility]:   Refer to the procedure report that was given to you for any specific questions about what was found during the examination.  If the procedure report does not answer your questions, please call your gastroenterologist to clarify.  If you requested that your care partner not be given the details of your procedure findings, then the procedure report has been included in a sealed envelope for you to review at your convenience later.  YOU SHOULD EXPECT: Some feelings of bloating in the abdomen. Passage of more gas than usual.  Walking can help get rid of the air that was put into your GI tract during the procedure and reduce the bloating. If you had a lower endoscopy (such as a colonoscopy or flexible sigmoidoscopy) you may notice spotting of blood in your stool or on the toilet paper. If you underwent a bowel prep for your procedure, you may not have a normal bowel movement for a few days.  Please Note:  You might notice some irritation and congestion in your nose or some drainage.  This is from the oxygen used during your procedure.  There is no need for concern and it should clear up in a day or so.  SYMPTOMS TO REPORT IMMEDIATELY:  Following upper endoscopy (EGD)  Vomiting of blood or coffee ground material  New chest pain or pain under the shoulder blades  Painful or persistently difficult swallowing  New shortness of breath  Fever of 100F or higher  Black, tarry-looking stools  For urgent or emergent issues, a gastroenterologist can be reached at any hour by calling [phone]. Do not use MyChart messaging for urgent concerns.    DIET:  We do recommend a small meal at first, but then you may proceed to your regular diet.  Drink plenty of fluids but you should avoid  alcoholic beverages for 24 hours.  ACTIVITY:  You should plan to take it easy for the rest of today and you should NOT DRIVE or use heavy machinery until tomorrow (because of the sedation medicines used during the test).    FOLLOW UP: Our staff will call the number listed on your records 48-72 hours following your procedure to check on you and address any questions or concerns that you may have regarding the information given to you following your procedure. If we do not reach you, we will leave a message.  We will attempt to reach you two times.  During this call, we will ask if you have developed any symptoms of COVID 19. If you develop any symptoms (ie: fever, flu-like symptoms, shortness of breath, cough etc.) before then, please call [phone].  If you test positive for Covid 19 in the 2 weeks post procedure, please call and report this information to [place].    If any biopsies were taken you will be contacted by phone or by letter within the next 1-3 weeks.  Please call us at [phone] if you have not heard about the biopsies in 3 weeks.    SIGNATURES/CONFIDENTIALITY: You and/or your care partner have signed paperwork which will be entered into your electronic medical record.  These signatures attest to the fact that that the information above on your After Visit Summary has been reviewed and is understood.  Full responsibility of  the confidentiality of this discharge information lies with you and/or your care-partner.

## 2021-10-08 NOTE — Progress Notes (Signed)
Pt in recovery with monitors in place, VSS. Report given to receiving RN. Bite guard was placed with pt awake to ensure comfort. No dental or soft tissue damage noted. RN will remove the guard when the pt is awake.  

## 2021-10-08 NOTE — Op Note (Signed)
[place] Patient Name: Melissa Rodgers Procedure Date: 10/08/2021 10:44 AM MRN: [id number] Endoscopist: Gerrit Heck , MD Age: 70 Referring MD:  Date of Birth: 1950-12-27 Gender: Female Account #: [id number] Procedure:                Upper GI endoscopy Indications:              Abdominal pain in the left upper quadrant                           History of lap hiatal hernia repair and Nissen in                            2017, and has since titrated off all acid                            suppression therapy. Medicines:                Monitored Anesthesia Care Procedure:                Pre-Anesthesia Assessment:                           - Prior to the procedure, a History and Physical                            was performed, and patient medications and                            allergies were reviewed. The patient's tolerance of                            previous anesthesia was also reviewed. The risks                            and benefits of the procedure and the sedation                            options and risks were discussed with the patient.                            All questions were answered, and informed consent                            was obtained. Prior Anticoagulants: The patient has                            taken Eliquis (apixaban), last dose was 2 days                            prior to procedure. ASA Grade Assessment: II - A                            patient with mild systemic disease. After reviewing  the risks and benefits, the patient was deemed in                            satisfactory condition to undergo the procedure.                           After obtaining informed consent, the endoscope was                            passed under direct vision. Throughout the                            procedure, the patient's blood pressure, pulse, and                            oxygen saturations were monitored  continuously. The                            Endoscope was introduced through the mouth, and                            advanced to the second part of duodenum. The upper                            GI endoscopy was accomplished without difficulty.                            The patient tolerated the procedure well. Scope In: Scope Out: Findings:                 The examined esophagus was normal.                           The Z-line was regular and was found 35 cm from the                            incisors. The lower esophageal lumen was                            appropriately narrow, consistent with prior history                            of fundoplication. This was easily traversed.                           Evidence of a Nissen fundoplication was found in                            the cardia. The wrap appeared intact. This was                            traversed.                           Mild inflammation characterized  by erythema was                            found in the gastric body, at the incisura and in                            the gastric antrum. No ulcers or erosions noted.                            Biopsies were taken with a cold forceps for                            Helicobacter pylori testing. Estimated blood loss                            was minimal. The pylorus was patent and easily                            traversed.                           The examined duodenum was normal. Complications:            No immediate complications. Estimated Blood Loss:     Estimated blood loss was minimal. Impression:               - Normal esophagus.                           - Z-line regular, 35 cm from the incisors.                           - A Nissen fundoplication was found. The wrap                            appears intact.                           - Mild, non-ulcer gastritis. Biopsied.                           - Normal examined duodenum. Recommendation:            - Patient has a contact number available for                            emergencies. The signs and symptoms of potential                            delayed complications were discussed with the                            patient. Return to normal activities tomorrow.                            Written discharge instructions were provided to the  patient.                           - Resume previous diet.                           - Continue present medications.                           - Await pathology results.                           - Return to GI clinic PRN.                           - If symptoms persist, could consider RUQ [place] and                            HIDA scan to evaluate for atypical biliary etiology. Gerrit Heck, MD 10/08/2021 11:09:33 AM

## 2021-10-08 NOTE — Progress Notes (Signed)
GASTROENTEROLOGY PROCEDURE H&P NOTE   Primary Care Physician: Samuel Bouche, NP    Reason for Procedure:   LUQ pain, heartburn, history of Nissen fundoplication  Plan:    EGD  Patient is appropriate for endoscopic procedure(s) in the ambulatory ([facility]) setting.  The nature of the procedure, as well as the risks, benefits, and alternatives were carefully and thoroughly reviewed with the patient. Ample time for discussion and questions allowed. The patient understood, was satisfied, and agreed to proceed.     HPI: Melissa Rodgers is a 70 y.o. female who presents for EGD for evaluation of LUQ pain along with heartburn in the setting of lap HHR and Nissne Fundoplication in 9767.  Patient was most recently seen in the Gastroenterology Clinic on 09/17/2021 by me.  No interval change in medical history since that appointment. Please refer to that note for full details regarding GI history and clinical presentation.   Past Medical History:  Diagnosis Date   Allergy    seasonal   Anemia    Atrial fibrillation 06/18/2019   BMI 30.0-30.9,adult 01/27/2013   Candidosis of skin 04/17/2016   Formatting of this note might be different from the original. 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   Dermal nevus of left forearm 04/17/2016   Diverticulitis of sigmoid colon 08/01/2019   Dry eye syndrome of both eyes 09/09/2018   Dry skin 04/17/2016   Dysrhythmia    Afib   Essential hypertension 09/09/2018   Fibromyalgia    Gastroesophageal reflux disease without esophagitis 11/06/2015   Generalized anxiety disorder with panic attacks 08/01/2019   History of palpitations 09/09/2018   Hyperlipidemia 09/09/2018   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   Knee pain, acute 03/05/2020   Left knee DJD 03/27/2014   Major  depressive disorder 09/09/2018   Microscopic hematuria 09/09/2018   Multiple benign nevi 04/17/2016   Non-seasonal allergic rhinitis 09/09/2018   Osteoarthritis    Palpitations 09/28/2013   Formatting of this note is different from the original. 01/29/15:  24 Hour Holter Monitor CONCLUSIONS: 1. Overall normal 24-hour Holter monitor recording, with rare, brief,  asymptomatic nonsustained supraventricular tachycardia as described.  2. No cardiac rhythm explanation for patient's symptoms.       Primary osteoarthritis of both knees 10/07/2016   Formatting of this note might be different from the original. 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   Primary osteoarthritis of right knee 11/21/2014   S/P laparoscopic fundoplication 34/19/3790   Scar condition and fibrosis of skin 04/17/2016   Formatting of this note might be different from the original. lower abdomen   Sebaceous hyperplasia 04/17/2016   Seborrheic keratoses 04/17/2016   Skin tags, multiple acquired 04/17/2016   Solar lentigo 04/17/2016   Subarachnoid hemorrhage following injury 2010   Fall from horse; brief hospitalization    Past Surgical History:  Procedure Laterality Date   ESOPHAGOGASTRODUODENOSCOPY     possibly last one was 2017. In [place]   HERNIA REPAIR     MEDIAL PARTIAL KNEE REPLACEMENT Right 24/07/7352   NISSEN FUNDOPLICATION  29/92/4268   OOPHORECTOMY     PARTIAL KNEE ARTHROPLASTY Left 04/05/2014   TOTAL KNEE REVISION Left 05/10/2021   Procedure: TOTAL KNEE REVISION;  Surgeon: Dorna Leitz, MD;  Location: [facility];  Service: Orthopedics;  Laterality: Left;  Prior to Admission medications   Medication Sig Start Date End Date Taking? Authorizing Provider  cetirizine (ZYRTEC) 10 MG tablet Take 10 mg by mouth daily.   Yes [provider]  Cholecalciferol (VITAMIN D-3) 1000 units CAPS Take 1,000 Units by mouth daily.   Yes [provider]  clonazePAM (KLONOPIN) 0.5  MG tablet Take 0.5-1 tablets (0.25-0.5 mg total) by mouth daily as needed for anxiety. 09/24/21  Yes Terrilyn Saver, NP  Cyanocobalamin (B-12 PO) Take 1 tablet by mouth daily.   Yes [provider]  docusate sodium (COLACE) 100 MG capsule Take 1 capsule (100 mg total) by mouth 2 (two) times daily. 05/10/21  Yes Gary Fleet, PA-C  ELIQUIS 5 MG TABS tablet TAKE 1 TABLET TWICE A DAY 10/07/21  Yes Richardo Priest, MD  flecainide (TAMBOCOR) 50 MG tablet TAKE 1 TABLET(50 MG) BY MOUTH TWICE DAILY 12/03/20  Yes Richardo Priest, MD  FLUoxetine (PROZAC) 10 MG tablet Take 10 mg by mouth daily. 02/10/21  Yes [provider]  metoprolol succinate (TOPROL-XL) 25 MG 24 hr tablet Take 0.5 tablets (12.5 mg total) by mouth daily. 07/26/21  Yes Richardo Priest, MD  diltiazem (CARDIZEM) 30 MG tablet Take 1 tablet (30 mg total) by mouth every 4 (four) hours as needed. Patient taking differently: Take 30 mg by mouth every 4 (four) hours as needed (Atrial Fib). 06/06/20   Richardo Priest, MD  HYDROcodone-acetaminophen (NORCO/VICODIN) 5-325 MG tablet Take 1 tablet by mouth every 6 (six) hours as needed for moderate pain.    [provider]    Current Outpatient Medications  Medication Sig Dispense Refill   cetirizine (ZYRTEC) 10 MG tablet Take 10 mg by mouth daily.     Cholecalciferol (VITAMIN D-3) 1000 units CAPS Take 1,000 Units by mouth daily.     clonazePAM (KLONOPIN) 0.5 MG tablet Take 0.5-1 tablets (0.25-0.5 mg total) by mouth daily as needed for anxiety. 10 tablet 0   Cyanocobalamin (B-12 PO) Take 1 tablet by mouth daily.     docusate sodium (COLACE) 100 MG capsule Take 1 capsule (100 mg total) by mouth 2 (two) times daily. 30 capsule 0   ELIQUIS 5 MG TABS tablet TAKE 1 TABLET TWICE A DAY 180 tablet 1   flecainide (TAMBOCOR) 50 MG tablet TAKE 1 TABLET(50 MG) BY MOUTH TWICE DAILY 180 tablet 2   FLUoxetine (PROZAC) 10 MG tablet Take 10 mg by mouth daily.     metoprolol succinate (TOPROL-XL)  25 MG 24 hr tablet Take 0.5 tablets (12.5 mg total) by mouth daily. 45 tablet 3   diltiazem (CARDIZEM) 30 MG tablet Take 1 tablet (30 mg total) by mouth every 4 (four) hours as needed. (Patient taking differently: Take 30 mg by mouth every 4 (four) hours as needed (Atrial Fib).) 90 tablet 3   HYDROcodone-acetaminophen (NORCO/VICODIN) 5-325 MG tablet Take 1 tablet by mouth every 6 (six) hours as needed for moderate pain.     Current Facility-Administered Medications  Medication Dose Route Frequency Provider Last Rate Last Admin   0.9 %  sodium chloride infusion  500 mL Intravenous Once Oaklynn Stierwalt V, DO        Allergies as of 10/08/2021 - Review Complete 10/08/2021  Allergen Reaction Noted   Rosuvastatin Other (See Comments) 04/13/2017   Sulfa antibiotics Rash 01/27/2013    Family History  Problem Relation Age of Onset   Diabetes Mother    Heart disease Mother    Cancer Father  small places of skin cancer to face, unsure of type   Alzheimer's disease Father        Symptom onset in late 53s   Atrial fibrillation Sister    Tremor Other        Significant maternal history of tremor; unknown regarding Parkinson's disease diagnoses   Colon cancer Neg Hx    Esophageal cancer Neg Hx    Stomach cancer Neg Hx    Rectal cancer Neg Hx     Social History   Socioeconomic History   Marital status: Married    Spouse name: Kirtland Bouchard   Number of children: 0   Years of education: 18   Highest education level: Master's degree (e.g., MA, MS, MEng, MEd, MSW, MBA)  Occupational History   Occupation: Retired  Tobacco Use   Smoking status: Former    Types: Cigarettes    Quit date: 08/18/1983    Years since quitting: 38.1   Smokeless tobacco: Never  Vaping Use   Vaping Use: Never used  Substance and Sexual Activity   Alcohol use: Yes    Alcohol/week: 7.0 - 14.0 standard drinks    Types: 7 - 14 Glasses of wine per week    Comment: 1-2 drinks per night, usually wine   Drug  use: Never   Sexual activity: Yes    Partners: Male    Birth control/protection: None  Other Topics Concern   Not on file  Social History Narrative   Lives with her husband. Stays active and has a lot of hobbies such as gardening, cooking, reading and doing crafts.   Social Determinants of Health   Financial Resource Strain: Low Risk    Difficulty of Paying Living Expenses: Not hard at all  Food Insecurity: No Food Insecurity   Worried About Charity fundraiser in the Last Year: Never true   [place] in the Last Year: Never true  Transportation Needs: No Transportation Needs   Lack of Transportation (Medical): No   Lack of Transportation (Non-Medical): No  Physical Activity: Inactive   Days of Exercise per Week: 0 days   Minutes of Exercise per Session: 0 min  Stress: No Stress Concern Present   Feeling of Stress : Not at all  Social Connections: Moderately Isolated   Frequency of Communication with Friends and Family: More than three times a week   Frequency of Social Gatherings with Friends and Family: Once a week   Attends Religious Services: Never   Marine scientist or Organizations: No   Attends Music therapist: Never   Marital Status: Married  Human resources officer Violence: Not At Risk   Fear of Current or Ex-Partner: No   Emotionally Abused: No   Physically Abused: No   Sexually Abused: No    Physical Exam: Vital signs in last 24 hours: @BP  (!) 154/82   Pulse 60   Temp 97.7 F (36.5 C) (Temporal)   Ht 5\' 3"  (1.6 m)   Wt 178 lb (80.7 kg)   SpO2 100%   BMI 31.53 kg/m  GEN: NAD EYE: Sclerae anicteric ENT: MMM CV: Non-tachycardic Pulm: CTA b/l GI: Soft, NT/ND NEURO:  Alert & Oriented x 3   Gerrit Heck, DO [facility]   10/08/2021 10:43 AM

## 2021-10-08 NOTE — Progress Notes (Signed)
VS-CW 

## 2021-10-10 ENCOUNTER — Telehealth: Payer: Self-pay

## 2021-10-10 ENCOUNTER — Encounter: Payer: Self-pay | Admitting: Medical-Surgical

## 2021-10-10 NOTE — Telephone Encounter (Signed)
First post procedure follow up call, no answer 

## 2021-10-21 DIAGNOSIS — Z20822 Contact with and (suspected) exposure to covid-19: Secondary | ICD-10-CM | POA: Diagnosis not present

## 2021-10-24 DIAGNOSIS — Z20822 Contact with and (suspected) exposure to covid-19: Secondary | ICD-10-CM | POA: Diagnosis not present

## 2021-10-25 ENCOUNTER — Encounter: Payer: Self-pay | Admitting: Gastroenterology

## 2021-10-30 ENCOUNTER — Ambulatory Visit: Payer: Medicare Other | Admitting: Medical-Surgical

## 2021-11-10 ENCOUNTER — Encounter: Payer: Self-pay | Admitting: Medical-Surgical

## 2021-11-11 ENCOUNTER — Other Ambulatory Visit: Payer: Self-pay | Admitting: Medical-Surgical

## 2021-11-11 MED ORDER — FLUOXETINE HCL 20 MG PO TABS: 20.0000 mg | ORAL_TABLET | Freq: Every day | ORAL | 1 refills | Status: DC

## 2021-11-18 ENCOUNTER — Other Ambulatory Visit: Payer: Self-pay | Admitting: Cardiology

## 2021-11-19 NOTE — Telephone Encounter (Signed)
Refills sent to pharmacy as requested.

## 2021-11-24 DIAGNOSIS — Z20822 Contact with and (suspected) exposure to covid-19: Secondary | ICD-10-CM | POA: Diagnosis not present

## 2021-12-06 ENCOUNTER — Ambulatory Visit (INDEPENDENT_AMBULATORY_CARE_PROVIDER_SITE_OTHER): Payer: Medicare Other | Admitting: Family Medicine

## 2021-12-06 ENCOUNTER — Other Ambulatory Visit: Payer: Self-pay

## 2021-12-06 ENCOUNTER — Encounter: Payer: Self-pay | Admitting: Family Medicine

## 2021-12-06 VITALS — BP 145/54 | HR 69 | Temp 98.1°F | Ht 63.0 in | Wt 179.0 lb

## 2021-12-06 DIAGNOSIS — K1370 Unspecified lesions of oral mucosa: Secondary | ICD-10-CM

## 2021-12-06 DIAGNOSIS — J0140 Acute pansinusitis, unspecified: Secondary | ICD-10-CM

## 2021-12-06 MED ORDER — AMOXICILLIN-POT CLAVULANATE 875-125 MG PO TABS: 1.0000 | ORAL_TABLET | Freq: Two times a day (BID) | ORAL | 0 refills | Status: AC

## 2021-12-06 MED ORDER — FLUCONAZOLE 150 MG PO TABS: 150.0000 mg | ORAL_TABLET | Freq: Once | ORAL | 1 refills | Status: AC

## 2021-12-06 NOTE — Progress Notes (Signed)
Acute Office Visit  Subjective:    Patient ID: Melissa Rodgers, female    DOB: 1951/04/14, 71 y.o.   MRN: [id number]  Chief Complaint  Patient presents with   cold like symptoms    X 2 weeks    HPI Patient is in today for URI symptoms.   Patient states she started feeling bad the first week of January. Symptoms have included eye pressure/burning, cough (slowly improving), headaches, sinus pressure (left sided), chills, mild nausea, fatigue, rhinorrhea, post nasal drainage, left ear discomfort. She denies any chest pain, dyspnea, wheezing, loss of taste/smell, vomiting, diarrhea.   Additionally she has had a recent flare in oral sores, states all have now resolved except for one inside her lower lip that is almost gone. She reports this happens to her fairly regularly. She remembers being treat with acyclovir in the past but she is not sure if they are true cold sores - she would like to do HSV testing to know if adding a PRN antiviral would be helpful for her in the future.     Past Medical History:  Diagnosis Date   Allergy    seasonal   Anemia    Atrial fibrillation 06/18/2019   BMI 30.0-30.9,adult 01/27/2013   Candidosis of skin 04/17/2016   Formatting of this note might be different from the original. 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 01/29/15:  24 Hour Holter Monitor CONCLUSIONS: 1. Overall normal 24-hour Holter monitor recording, with rare, brief,  asymptomatic nonsustained supraventricular tachycardia as described.  2. No cardiac rhythm explanation for patient's symptoms.       Primary osteoarthritis of both knees 10/07/2016   Formatting of this note might be different from the original. 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   Primary osteoarthritis of right knee 11/21/2014   S/P laparoscopic fundoplication 03/17/2016   Scar condition and fibrosis of skin 04/17/2016   Formatting of this note might be different from the original. lower abdomen   Sebaceous hyperplasia 04/17/2016   Seborrheic keratoses 04/17/2016   Skin tags, multiple acquired 04/17/2016   Solar lentigo 04/17/2016   Subarachnoid hemorrhage following injury 2010   Fall from horse; brief hospitalization    Past Surgical History:  Procedure Laterality Date   ESOPHAGOGASTRODUODENOSCOPY     possibly last one was 2017. In [place]   HERNIA REPAIR     MEDIAL PARTIAL KNEE REPLACEMENT Right 12/14/2014   NISSEN FUNDOPLICATION  02/21/2016   OOPHORECTOMY     PARTIAL KNEE ARTHROPLASTY Left 04/05/2014   TOTAL  KNEE REVISION Left 05/10/2021   Procedure: TOTAL KNEE REVISION;  Surgeon: Dorna Leitz, MD;  Location: [facility];  Service: Orthopedics;  Laterality: Left;    Family History  Problem Relation Age of Onset   Diabetes Mother    Heart disease Mother    Cancer Father        small places of skin cancer to face, unsure of type   Alzheimer's disease Father        Symptom onset in late 95s   Atrial fibrillation Sister     Tremor Other        Significant maternal history of tremor; unknown regarding Parkinson's disease diagnoses   Colon cancer Neg Hx    Esophageal cancer Neg Hx    Stomach cancer Neg Hx    Rectal cancer Neg Hx     Social History   Socioeconomic History   Marital status: Married    Spouse name: Kirtland Bouchard   Number of children: 0   Years of education: 18   Highest education level: Master's degree (e.g., MA, MS, MEng, MEd, MSW, MBA)  Occupational History   Occupation: Retired  Tobacco Use   Smoking status: Former    Types: Cigarettes    Quit date: 08/18/1983    Years since quitting: 38.3   Smokeless tobacco: Never  Vaping Use   Vaping Use: Never used  Substance and Sexual Activity   Alcohol use: Yes    Alcohol/week: 7.0 - 14.0 standard drinks    Types: 7 - 14 Glasses of wine per week    Comment: 1-2 drinks per night, usually wine   Drug use: Never   Sexual activity: Yes    Partners: Male    Birth control/protection: None  Other Topics Concern   Not on file  Social History Narrative   Lives with her husband. Stays active and has a lot of hobbies such as gardening, cooking, reading and doing crafts.   Social Determinants of Health   Financial Resource Strain: Low Risk    Difficulty of Paying Living Expenses: Not hard at all  Food Insecurity: No Food Insecurity   Worried About Charity fundraiser in the Last Year: Never true   [place] in the Last Year: Never true  Transportation Needs: No Transportation Needs   Lack of Transportation (Medical): No   Lack of Transportation (Non-Medical): No  Physical Activity: Inactive   Days of Exercise per Week: 0 days   Minutes of Exercise per Session: 0 min  Stress: No Stress Concern Present   Feeling of Stress : Not at all  Social Connections: Moderately Isolated   Frequency of Communication with Friends and Family: More than three times a week   Frequency of Social Gatherings with Friends and Family: Once a week    Attends Religious Services: Never   Marine scientist or Organizations: No   Attends Music therapist: Never   Marital Status: Married  Human resources officer Violence: Not At Risk   Fear of Current or Ex-Partner: No   Emotionally Abused: No   Physically Abused: No   Sexually Abused: No    Outpatient Medications Prior to Visit  Medication Sig Dispense Refill   cetirizine (ZYRTEC) 10 MG tablet Take 10 mg by mouth daily.     Cholecalciferol (VITAMIN D-3) 1000 units CAPS Take 1,000 Units by mouth daily.     clonazePAM (KLONOPIN) 0.5 MG tablet Take 0.5-1 tablets (0.25-0.5 mg total) by mouth daily as needed  for anxiety. 10 tablet 0   Cyanocobalamin (B-12 PO) Take 1 tablet by mouth daily.     diltiazem (CARDIZEM) 30 MG tablet Take 1 tablet (30 mg total) by mouth every 4 (four) hours as needed. (Patient taking differently: Take 30 mg by mouth every 4 (four) hours as needed (Atrial Fib).) 90 tablet 3   docusate sodium (COLACE) 100 MG capsule Take 1 capsule (100 mg total) by mouth 2 (two) times daily. 30 capsule 0   ELIQUIS 5 MG TABS tablet TAKE 1 TABLET TWICE A DAY 180 tablet 1   flecainide (TAMBOCOR) 50 MG tablet TAKE 1 TABLET(50 MG) BY MOUTH TWICE DAILY 180 tablet 2   FLUoxetine (PROZAC) 20 MG tablet Take 1 tablet (20 mg total) by mouth daily. 90 tablet 1   HYDROcodone-acetaminophen (NORCO/VICODIN) 5-325 MG tablet Take 1 tablet by mouth every 6 (six) hours as needed for moderate pain.     metoprolol succinate (TOPROL-XL) 25 MG 24 hr tablet TAKE 1 TABLET(25 MG) BY MOUTH DAILY 90 tablet 2   No facility-administered medications prior to visit.    Allergies  Allergen Reactions   Rosuvastatin Other (See Comments)    Musculoskeletal Aches Muscle spasms   Sulfa Antibiotics Rash    Review of Systems All review of systems negative except what is listed in the HPI     Objective:    Physical Exam Vitals reviewed.  Constitutional:      Appearance: Normal appearance.  HENT:      Head:     Comments: Left frontal/maxillary sinus pressure to palpation    Right Ear: Tympanic membrane normal.     Left Ear: Tympanic membrane normal.     Nose: Congestion and rhinorrhea present.     Mouth/Throat:     Mouth: Mucous membranes are moist.     Pharynx: Oropharynx is clear.  Cardiovascular:     Rate and Rhythm: Normal rate and regular rhythm.  Pulmonary:     Effort: Pulmonary effort is normal.     Breath sounds: Normal breath sounds.  Musculoskeletal:     Cervical back: Normal range of motion and neck supple. No tenderness.  Lymphadenopathy:     Cervical: No cervical adenopathy.  Skin:    General: Skin is warm and dry.  Neurological:     General: No focal deficit present.     Mental Status: She is alert and oriented to person, place, and time. Mental status is at baseline.  Psychiatric:        Mood and Affect: Mood normal.        Behavior: Behavior normal.        Thought Content: Thought content normal.        Judgment: Judgment normal.    BP (!) 145/54 (BP Location: Left Arm, Patient Position: Sitting, Cuff Size: Normal)    Pulse 69    Temp 98.1 F (36.7 C) (Oral)    Ht $R'5\' 3"'VQ$  (1.6 m)    Wt 179 lb (81.2 kg)    SpO2 97%    BMI 31.71 kg/m  Wt Readings from Last 3 Encounters:  12/06/21 179 lb (81.2 kg)  10/08/21 178 lb (80.7 kg)  09/26/21 178 lb (80.7 kg)    Health Maintenance Due  Topic Date Due   Zoster Vaccines- Shingrix (1 of 2) Never done   Pneumonia Vaccine 49+ Years old (3 - PPSV23 if available, else PCV20) 10/14/2018   COVID-19 Vaccine (5 - Booster for Pfizer series) 09/25/2021    There are  no preventive care reminders to display for this patient.   Lab Results  Component Value Date   TSH 2.16 07/01/2021   Lab Results  Component Value Date   WBC 6.1 07/01/2021   HGB 13.6 07/01/2021   HCT 43.1 07/01/2021   MCV 90.2 07/01/2021   PLT 362 07/01/2021   Lab Results  Component Value Date   NA 139 07/01/2021   K 4.4 07/01/2021   CO2 28  07/01/2021   GLUCOSE 74 07/01/2021   BUN 14 07/01/2021   CREATININE 0.50 (L) 07/01/2021   BILITOT 0.4 07/01/2021   ALKPHOS 63 04/29/2021   AST 17 07/01/2021   ALT 15 07/01/2021   PROT 7.2 07/01/2021   ALBUMIN 4.3 04/29/2021   CALCIUM 9.4 07/01/2021   ANIONGAP 7 04/29/2021   EGFR 100 07/01/2021   Lab Results  Component Value Date   CHOL 204 (H) 07/01/2021   Lab Results  Component Value Date   HDL 42 (L) 07/01/2021   Lab Results  Component Value Date   LDLCALC 111 (H) 07/01/2021   Lab Results  Component Value Date   TRIG 356 (H) 07/01/2021   Lab Results  Component Value Date   CHOLHDL 4.9 07/01/2021   Lab Results  Component Value Date   HGBA1C 5.6 07/01/2021       Assessment & Plan:   1. Acute non-recurrent pansinusitis Treating with antibiotics. Adding Diflucan incase she develops a yeast infection. Continue supportive measures including rest, hydration, humidifier use, steam showers, warm compresses to sinuses, warm liquids with lemon and honey, and over-the-counter cough, cold, and analgesics as needed.   - amoxicillin-clavulanate (AUGMENTIN) 875-125 MG tablet; Take 1 tablet by mouth 2 (two) times daily for 7 days.  Dispense: 14 tablet; Refill: 0 - fluconazole (DIFLUCAN) 150 MG tablet; Take 1 tablet (150 mg total) by mouth once for 1 dose.  Dispense: 1 tablet; Refill: 1  2. Oral mucosal lesion Will check to see if HSV related. Ulcers mostly resolved now.  - HSV(herpes simplex vrs) 1+2 ab-IgG   Patient aware of signs/symptoms requiring further/urgent evaluation.   Follow-up if symptoms worsen or fail to improve.   Terrilyn Saver, NP

## 2021-12-06 NOTE — Patient Instructions (Signed)
Antibiotics sent in for your sinus infection. Adding diflucan for possible yeast infection after taking antibiotics.  Continue supportive measures including rest, hydration, humidifier use, steam showers, warm compresses to sinuses, warm liquids with lemon and honey, and over-the-counter cough, cold, and analgesics as needed.    Checking HSV via blood work today to see if antiviral medications may be helpful for your oral sores.

## 2021-12-09 ENCOUNTER — Other Ambulatory Visit: Payer: Self-pay | Admitting: Medical-Surgical

## 2021-12-09 LAB — HSV(HERPES SIMPLEX VRS) I + II AB-IGG
HAV 1 IGG,TYPE SPECIFIC AB: <0.9 index
HSV 2 IGG,TYPE SPECIFIC AB: <0.9 index

## 2021-12-09 MED ORDER — NYSTATIN 100000 UNIT/ML MT SUSP: 5.0000 mL | Freq: Four times a day (QID) | OROMUCOSAL | 0 refills | Status: AC

## 2021-12-31 DIAGNOSIS — M25562 Pain in left knee: Secondary | ICD-10-CM | POA: Diagnosis not present

## 2022-02-02 NOTE — Progress Notes (Unsigned)
Cardiology Office Note:    Date:  02/02/2022   ID:  Melissa Rodgers, DOB 1950-12-02, MRN [id number]  PCP:  Samuel Bouche, NP  Cardiologist:  Shirlee More, MD    Referring MD: [name], DO    ASSESSMENT:    No diagnosis found. PLAN:    In order of problems listed above:  ***   Next appointment: ***   Medication Adjustments/Labs and Tests Ordered: Current medicines are reviewed at length with the patient today.  Concerns regarding medicines are outlined above.  No orders of the defined types were placed in this encounter.  No orders of the defined types were placed in this encounter.   No chief complaint on file.   History of Present Illness:    Melissa Rodgers is a 71 y.o. female with a hx of paroxysmal atrial fibrillation maintaining sinus rhythm on flecainide and anticoagulated and hypertension  last seen 07/26/2021. Compliance with diet, lifestyle and medications: *** Past Medical History:  Diagnosis Date   Allergy    seasonal   Anemia    Atrial fibrillation 06/18/2019   BMI 30.0-30.9,adult 01/27/2013   Candidosis of skin 04/17/2016   Formatting of this note might be different from the original. 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 01/29/15:  24 Hour Holter Monitor CONCLUSIONS: 1. Overall normal 24-hour Holter monitor recording, with rare, brief,  asymptomatic nonsustained supraventricular tachycardia as described.  2. No cardiac rhythm explanation for patient's symptoms.       Primary osteoarthritis of both knees 10/07/2016   Formatting of this note might be different from the original. 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   Primary osteoarthritis of right knee 11/21/2014   S/P laparoscopic fundoplication 58/07/9832   Scar condition and fibrosis of skin 04/17/2016   Formatting of this note might be different from the original. lower abdomen   Sebaceous hyperplasia 04/17/2016   Seborrheic keratoses 04/17/2016   Skin tags, multiple acquired 04/17/2016   Solar lentigo 04/17/2016   Subarachnoid hemorrhage following injury 2010   Fall from horse; brief hospitalization    Past Surgical History:  Procedure Laterality Date   ESOPHAGOGASTRODUODENOSCOPY     possibly last one was 2017. In [place]   HERNIA REPAIR     MEDIAL PARTIAL KNEE REPLACEMENT Right 82/50/5397   NISSEN FUNDOPLICATION  67/34/1937   OOPHORECTOMY     PARTIAL KNEE ARTHROPLASTY Left 04/05/2014   TOTAL KNEE REVISION Left 05/10/2021   Procedure: TOTAL KNEE REVISION;  Surgeon: Dorna Leitz, MD;  Location: [facility];  Service: Orthopedics;  Laterality: Left;    Current Medications: No outpatient medications have been marked as taking for the 02/03/22 encounter (Appointment) with Richardo Priest, MD.     Allergies:   Rosuvastatin and Sulfa antibiotics   Social History   Socioeconomic History   Marital status: Married    Spouse name: Kirtland Bouchard   Number of children: 0   Years of education: 18    Highest education level: Master's degree (e.g., MA, MS, MEng, MEd, MSW, MBA)  Occupational History   Occupation: Retired  Tobacco Use   Smoking status: Former    Types: Cigarettes    Quit date: 08/18/1983    Years since quitting: 38.4   Smokeless tobacco: Never  Vaping Use   Vaping Use: Never used  Substance and Sexual Activity   Alcohol use: Yes    Alcohol/week: 7.0 - 14.0 standard drinks    Types: 7 - 14 Glasses of wine per week    Comment: 1-2 drinks per night, usually wine   Drug use: Never   Sexual activity: Yes    Partners: Male    Birth control/protection: None  Other Topics Concern   Not on file  Social History Narrative   Lives with her husband. Stays active and has a lot of hobbies such as gardening, cooking, reading and doing crafts.   Social Determinants of Health   Financial Resource Strain: Low Risk    Difficulty of Paying Living Expenses: Not hard at all  Food Insecurity: No Food Insecurity   Worried About Charity fundraiser in the Last Year: Never true   [place] in the Last Year: Never true  Transportation Needs: No Transportation Needs   Lack of Transportation (Medical): No   Lack of Transportation (Non-Medical): No  Physical Activity: Inactive   Days of Exercise per Week: 0 days   Minutes of Exercise per Session: 0 min  Stress: No Stress Concern Present   Feeling of Stress : Not at all  Social Connections: Moderately Isolated   Frequency of Communication with Friends and Family: More than three times a week   Frequency of Social Gatherings with Friends and Family: Once a week   Attends Religious Services: Never   Marine scientist or Organizations: No   Attends Music therapist: Never   Marital Status: Married     Family History: The patient's ***family history includes Alzheimer's disease in her father; Atrial fibrillation in her sister; Cancer in her father; Diabetes in her mother; Heart disease in her mother; Tremor  in an other family member. There is no history of Colon cancer, Esophageal cancer, Stomach cancer, or Rectal cancer. ROS:   Please see the history of present illness.    All other systems reviewed and are negative.  EKGs/Labs/Other Studies Reviewed:    The following studies were reviewed today:  05/03/2021 her echocardiogram shows normal cardiac function no findings of heart failure.    1. Left ventricular ejection fraction, by estimation, is 60 to 65%. The left ventricle has normal function. The left ventricle has no regional wall motion abnormalities. Left ventricular diastolic parameters are consistent with Grade I diastolic dysfunction (impaired relaxation).  2. Right ventricular systolic function is normal. The right ventricular size is normal. There is normal pulmonary artery systolic pressure. The estimated right ventricular systolic pressure is 42.5 mmHg.  3. The mitral valve is normal in structure. Mild mitral valve regurgitation. No evidence of mitral stenosis.  4. The aortic valve is normal  in structure. Aortic valve regurgitation is mild. Mild aortic valve sclerosis is present, with no evidence of aortic valve stenosis. Aortic regurgitation PHT measures 748 msec.  5. The inferior vena cava is normal in size with greater than 50% respiratory variability, suggesting right atrial pressure of 3 mmHg.   Cardiac CT 01/21/2018 with a calcium score of 0 and normal coronary arteries   EKG:  EKG ordered today and personally reviewed.  The ekg ordered today demonstrates ***  Recent Labs: 05/02/2021: NT-Pro BNP 48 07/01/2021: ALT 15; BUN 14; Creat 0.50; Hemoglobin 13.6; Platelets 362; Potassium 4.4; Sodium 139; TSH 2.16  Recent Lipid Panel    Component Value Date/Time   CHOL 204 (H) 07/01/2021 0000   TRIG 356 (H) 07/01/2021 0000   HDL 42 (L) 07/01/2021 0000   CHOLHDL 4.9 07/01/2021 0000   LDLCALC 111 (H) 07/01/2021 0000    Physical Exam:    VS:  There were no vitals taken for this  visit.    Wt Readings from Last 3 Encounters:  12/06/21 179 lb (81.2 kg)  10/08/21 178 lb (80.7 kg)  09/26/21 178 lb (80.7 kg)     GEN: *** Well nourished, well developed in no acute distress HEENT: Normal NECK: No JVD; No carotid bruits LYMPHATICS: No lymphadenopathy CARDIAC: ***RRR, no murmurs, rubs, gallops RESPIRATORY:  Clear to auscultation without rales, wheezing or rhonchi  ABDOMEN: Soft, non-tender, non-distended MUSCULOSKELETAL:  No edema; No deformity  SKIN: Warm and dry NEUROLOGIC:  Alert and oriented x 3 PSYCHIATRIC:  Normal affect    Signed, Shirlee More, MD  02/02/2022 10:29 AM    [facility]

## 2022-02-03 ENCOUNTER — Encounter: Payer: Self-pay | Admitting: Cardiology

## 2022-02-03 ENCOUNTER — Ambulatory Visit: Payer: Medicare Other | Admitting: Cardiology

## 2022-02-04 ENCOUNTER — Ambulatory Visit: Payer: Medicare Other | Admitting: Cardiology

## 2022-03-06 NOTE — Progress Notes (Signed)
?HPI with pertinent ROS:  ? ?CC: med refills, general health ? ?HPI: ?Pleasant 71 year old female presenting today for the following: ? ?Mood- did well with the increase of fluoxetine and has no longer had to take the clonazapam. Tolerating the increase well without side effects. Denies SI/HI.  Due to potential interaction with gabapentin, she went back down to 10 mg on this but wants to know if taking 20 mg daily is safe along with gabapentin ? ?Myalgias/Arthralgias- taking Gabapentin '300mg'$  twice daily which is helping but feels a little spacey with it. Wonders if it was due to medication interactions, especially with fluoxetine.  Has been very busy in the yard and notes that her myalgias have gotten worse lately.  Was considering increasing her gabapentin to 300 mg 3 times daily but wanted to check and see what my thoughts were. ? ?Right index finger gets sore, swells, and gets red sometimes at the DIP joint. No injury or trauma.  No history of gout.  Not currently bothersome but this happens occasionally ? ?Mouth ulcers continue to be a problem.  She is receiving regular dental care and having multiple dental procedures.  She has to take antibiotics prophylactically with each of these procedures which causes her body to be in upper.  She continues to have the aphthous ulcers that arrive, sometimes multiple ulcers causing significant mouth pain.  She has talked to her dentist about this and has chlorhexidine as well as viscous lidocaine at home to treat this.  Unfortunately, those only provide temporary relief and she is wondering if there is something else available that will help clear the ulcers up. ? ?I reviewed the past medical history, family history, social history, surgical history, and allergies today and no changes were needed.  Please see the problem list section below in epic for further details. ? ? ?Physical exam:  ? ?General: Well Developed, well nourished, and in no acute distress.  ?Neuro: Alert  and oriented x3.  ?HEENT: Normocephalic, atraumatic.  ?Skin: Warm and dry. ?Cardiac: Regular rate and rhythm, no murmurs rubs or gallops, no lower extremity edema.  ?Respiratory: Clear to auscultation bilaterally. Not using accessory muscles, speaking in full sentences. ? ?Impression and Recommendations:   ? ?1. Generalized anxiety disorder with panic attacks ?2. Major depressive disorder, single episode, in remission ([facility]) ?Optimal response to fluoxetine 20 mg daily so resume at 20 mg daily dosing. ? ?3. Musculoskeletal pain ?At the low doses of fluoxetine and gabapentin, do not think her spacey feeling is related to an interaction.  She may not be tolerating the 300 mg at a time so we will try gabapentin 200 mg 3 times daily to see if this gives better control over her pain as well as helps manage the spacey feeling. ? ?4. Oral mucosal lesion ?Sending in dexamethasone 0.5 mg / 5 mL solution for swish and spit.  Advised that she should avoid using this if at all possible and use her conservative measures first and if no improvement in several days, then resort to oral steroids.  Also advised to discuss this with her dentist as this may interfere with his procedures and recommendations. ? ?5. Pain in finger of right hand ?Possible osteoarthritis flare but also consider gout.  Checking uric acid today.  Advised her to come back when she has a flare next time so we can visualize and examine her presenting symptoms. ?- Uric acid ? ?Return in about 3 months (around 06/06/2022) for chronic disease follow up. ?___________________________________________ ?Lamoine Fredricksen  Bennett Scrape, DNP, APRN, FNP-BC ?Primary Care and Sports Medicine ?[place] ?

## 2022-03-07 ENCOUNTER — Encounter: Payer: Self-pay | Admitting: Medical-Surgical

## 2022-03-07 ENCOUNTER — Ambulatory Visit (INDEPENDENT_AMBULATORY_CARE_PROVIDER_SITE_OTHER): Payer: Medicare Other | Admitting: Medical-Surgical

## 2022-03-07 VITALS — BP 137/73 | HR 60 | Resp 20 | Ht 63.0 in | Wt 178.1 lb

## 2022-03-07 DIAGNOSIS — F325 Major depressive disorder, single episode, in full remission: Secondary | ICD-10-CM | POA: Diagnosis not present

## 2022-03-07 DIAGNOSIS — M79644 Pain in right finger(s): Secondary | ICD-10-CM | POA: Diagnosis not present

## 2022-03-07 DIAGNOSIS — F410 Panic disorder [episodic paroxysmal anxiety] without agoraphobia: Secondary | ICD-10-CM

## 2022-03-07 DIAGNOSIS — M7918 Myalgia, other site: Secondary | ICD-10-CM

## 2022-03-07 DIAGNOSIS — F411 Generalized anxiety disorder: Secondary | ICD-10-CM

## 2022-03-07 DIAGNOSIS — K1370 Unspecified lesions of oral mucosa: Secondary | ICD-10-CM

## 2022-03-07 MED ORDER — GABAPENTIN 100 MG PO CAPS: 200.0000 mg | ORAL_CAPSULE | Freq: Three times a day (TID) | ORAL | 0 refills | Status: DC

## 2022-03-07 MED ORDER — DEXAMETHASONE 0.5 MG/5ML PO ELIX: 0.5000 mg | ORAL_SOLUTION | Freq: Three times a day (TID) | ORAL | 0 refills | Status: DC | PRN

## 2022-03-08 LAB — URIC ACID: Uric Acid, Serum: 6.2 mg/dL (ref 2.5–7.0)

## 2022-03-19 DIAGNOSIS — M9902 Segmental and somatic dysfunction of thoracic region: Secondary | ICD-10-CM | POA: Diagnosis not present

## 2022-03-19 DIAGNOSIS — M546 Pain in thoracic spine: Secondary | ICD-10-CM | POA: Diagnosis not present

## 2022-03-19 DIAGNOSIS — M9901 Segmental and somatic dysfunction of cervical region: Secondary | ICD-10-CM | POA: Diagnosis not present

## 2022-03-19 DIAGNOSIS — M4723 Other spondylosis with radiculopathy, cervicothoracic region: Secondary | ICD-10-CM | POA: Diagnosis not present

## 2022-03-20 DIAGNOSIS — M9901 Segmental and somatic dysfunction of cervical region: Secondary | ICD-10-CM | POA: Diagnosis not present

## 2022-03-20 DIAGNOSIS — M9902 Segmental and somatic dysfunction of thoracic region: Secondary | ICD-10-CM | POA: Diagnosis not present

## 2022-03-20 DIAGNOSIS — M4723 Other spondylosis with radiculopathy, cervicothoracic region: Secondary | ICD-10-CM | POA: Diagnosis not present

## 2022-03-20 DIAGNOSIS — M546 Pain in thoracic spine: Secondary | ICD-10-CM | POA: Diagnosis not present

## 2022-03-23 NOTE — Progress Notes (Signed)
?Cardiology Office Note:   ? ?Date:  03/24/2022  ? ?ID:  [name], DOB 08/05/1951, MRN [id number] ? ?PCP:  Samuel Bouche, NP  ?Cardiologist:  Shirlee More, MD   ? ?Referring MD: Samuel Bouche, NP  ? ? ?ASSESSMENT:   ? ?1. PAF (paroxysmal atrial fibrillation) ([place])   ?2. High risk medication use   ?3. Chronic anticoagulation   ?4. Essential hypertension   ? ?PLAN:   ? ?In order of problems listed above: ? ?Shaily continues to do well maintaining sinus rhythm continue low-dose flecainide no evidence of 1C antiarrhythmic drug toxicity from her surface EKG her current anticoagulant we will try to keep her on some minimal dose of beta-blocker to control rate if she develops slow atrial flutter. ?Stable blood pressure continue low-dose beta-blocker no longer an ARB ? ? ?Next appointment: 9 months ? ? ?Medication Adjustments/Labs and Tests Ordered: ?Current medicines are reviewed at length with the patient today.  Concerns regarding medicines are outlined above.  ?Orders Placed This Encounter  ?Procedures  ? EKG 12-Lead  ? ?Meds ordered this encounter  ?Medications  ? metoprolol succinate (TOPROL XL) 25 MG 24 hr tablet  ?  Sig: Take 0.5 tablets (12.5 mg total) by mouth daily.  ?  Dispense:  45 tablet  ?  Refill:  3  ? ? ?Chief Complaint  ?Patient presents with  ? Follow-up  ? Atrial Fibrillation  ? ? ?History of Present Illness:   ? ?Melissa Rodgers is a 71 y.o. female with a hx of paroxysmal atrial fibrillation maintaining sinus rhythm on flecainide and anticoagulated and hypertension  last seen 07/26/2021 ?Compliance with diet, lifestyle and medications: Yes ? ?She is struggled to recover from her knee surgery but otherwise has done well she tolerates her anticoagulant without bleeding flecainide without side effect has a smart watch and I reviewed at least 10 strips all sinus rhythm from her iPhone and maintain sinus rhythm.  She had increased her beta-blocker with a systolic blood pressure high normal and finds herself  more fatigued with heart rates in the 50s will reduce it back to previous dose and if rate remains less than 60 reduce to every other day ?Past Medical History:  ?Diagnosis Date  ? Allergy   ? seasonal  ? Anemia   ? Atrial fibrillation 06/18/2019  ? BMI 30.0-30.9,adult 01/27/2013  ? Candidosis of skin 04/17/2016  ? Formatting of this note might be different from the original. lower abdomen  ? Cherry angioma 04/17/2016  ? Chronic bilateral low back pain without sciatica 10/07/2016  ? Chronic bilateral upper abdominal pain 10/07/2016  ? 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  ? Dermal nevus of left forearm 04/17/2016  ? Diverticulitis of sigmoid colon 08/01/2019  ? Dry eye syndrome of both eyes 09/09/2018  ? Dry skin 04/17/2016  ? Dysrhythmia   ? Afib  ? Essential hypertension 09/09/2018  ? Fibromyalgia   ? Gastroesophageal reflux disease without esophagitis 11/06/2015  ? Generalized anxiety disorder with panic attacks 08/01/2019  ? History of palpitations 09/09/2018  ? Hyperlipidemia 09/09/2018  ? 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  ? Knee pain, acute 03/05/2020  ? Left knee DJD 03/27/2014  ? Major depressive disorder 09/09/2018  ? Microscopic hematuria 09/09/2018  ? Multiple benign nevi 04/17/2016  ? Non-seasonal allergic rhinitis 09/09/2018  ? Osteoarthritis   ? Palpitations 09/28/2013  ? Formatting of this note is different from the original. 01/29/15:  24 Hour Holter Monitor CONCLUSIONS: 1. Overall normal 24-hour Holter monitor  recording, with rare, brief,  asymptomatic nonsustained supraventricular tachycardia as described.  2. No cardiac rhythm explanation for patient's symptoms.      ? Primary osteoarthritis of both knees 10/07/2016  ? Formatting of this note might be different from the original. 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  ? Primary osteoarthritis of right knee 11/21/2014  ? S/P laparoscopic fundoplication 82/50/5397  ? Scar condition  and fibrosis of skin 04/17/2016  ? Formatting of this note might be different from the original. lower abdomen  ? Sebaceous hyperplasia 04/17/2016  ? Seborrheic keratoses 04/17/2016  ? Skin tags, multiple acquired 04/17/2016  ? Solar lentigo 04/17/2016  ? Subarachnoid hemorrhage following injury ([place]) 2010  ? Fall from horse; brief hospitalization  ? ? ?Past Surgical History:  ?Procedure Laterality Date  ? ESOPHAGOGASTRODUODENOSCOPY    ? possibly last one was 2017. In [place]  ? HERNIA REPAIR    ? MEDIAL PARTIAL KNEE REPLACEMENT Right 12/14/2014  ? NISSEN FUNDOPLICATION  67/34/1937  ? OOPHORECTOMY    ? PARTIAL KNEE ARTHROPLASTY Left 04/05/2014  ? TOTAL KNEE REVISION Left 05/10/2021  ? Procedure: TOTAL KNEE REVISION;  Surgeon: Dorna Leitz, MD;  Location: [facility];  Service: Orthopedics;  Laterality: Left;  ? ? ?Current Medications: ?Current Meds  ?Medication Sig  ? cetirizine (ZYRTEC) 10 MG tablet Take 10 mg by mouth daily.  ? Cholecalciferol (VITAMIN D-3) 1000 units CAPS Take 1,000 Units by mouth daily.  ? Cyanocobalamin (B-12 PO) Take 1 tablet by mouth daily.  ? dexamethasone 0.5 MG/5ML elixir Take 5 mLs (0.5 mg total) by mouth 3 (three) times daily as needed (aphthous ulcers that do not respond to other treatments).  ? diltiazem (CARDIZEM) 30 MG tablet Take 1 tablet (30 mg total) by mouth every 4 (four) hours as needed. (Patient taking differently: Take 30 mg by mouth every 4 (four) hours as needed (Atrial Fib).)  ? ELIQUIS 5 MG TABS tablet TAKE 1 TABLET TWICE A DAY  ? flecainide (TAMBOCOR) 50 MG tablet TAKE 1 TABLET(50 MG) BY MOUTH TWICE DAILY  ? FLUoxetine (PROZAC) 20 MG tablet Take 1 tablet (20 mg total) by mouth daily.  ? gabapentin (NEURONTIN) 100 MG capsule Take 2 capsules (200 mg total) by mouth 3 (three) times daily.  ? metoprolol succinate (TOPROL XL) 25 MG 24 hr tablet Take 0.5 tablets (12.5 mg total) by mouth daily.  ? [DISCONTINUED] metoprolol succinate (TOPROL-XL) 25 MG 24 hr tablet TAKE 1 TABLET(25 MG) BY  MOUTH DAILY  ?  ? ?Allergies:   Rosuvastatin and Sulfa antibiotics  ? ?Social History  ? ?Socioeconomic History  ? Marital status: Married  ?  Spouse name: Kirtland Bouchard  ? Number of children: 0  ? Years of education: 4  ? Highest education level: Master's degree (e.g., MA, MS, MEng, MEd, MSW, MBA)  ?Occupational History  ? Occupation: Retired  ?Tobacco Use  ? Smoking status: Former  ?  Types: Cigarettes  ?  Quit date: 08/18/1983  ?  Years since quitting: 38.6  ?  Passive exposure: Past  ? Smokeless tobacco: Never  ?Vaping Use  ? Vaping Use: Never used  ?Substance and Sexual Activity  ? Alcohol use: Yes  ?  Alcohol/week: 7.0 - 14.0 standard drinks  ?  Types: 7 - 14 Glasses of wine per week  ?  Comment: 1-2 drinks per night, usually wine  ? Drug use: Never  ? Sexual activity: Yes  ?  Partners: Male  ?  Birth control/protection: None  ?Other Topics Concern  ? Not on file  ?Social History Narrative  ? Lives with her husband. Stays active and has a lot of hobbies such as gardening, cooking, reading and doing crafts.  ? ?Social Determinants of Health  ? ?Financial Resource Strain: Low Risk   ? Difficulty of Paying Living Expenses: Not hard at all  ?Food Insecurity: No Food Insecurity  ? Worried About Charity fundraiser in the Last Year: Never true  ? Ran Out of Food in the Last Year: Never true  ?Transportation Needs: No Transportation Needs  ? Lack of Transportation (Medical): No  ? Lack of Transportation (Non-Medical): No  ?Physical Activity: Inactive  ? Days of Exercise per Week: 0 days  ? Minutes of Exercise per Session: 0 min  ?Stress: No Stress Concern Present  ? Feeling of Stress : Not at all  ?Social Connections: Moderately Isolated  ? Frequency of Communication with Friends and Family: More than three times a week  ? Frequency of Social Gatherings with Friends and Family: Once a week  ? Attends Religious Services: Never  ? Active Member of Clubs or Organizations: No  ? Attends Archivist Meetings:  Never  ? Marital Status: Married  ?  ? ?Family History: ?The patient's family history includes Alzheimer's disease in her father; Atrial fibrillation in her sister; Cancer in her father; Diabetes in her m

## 2022-03-24 ENCOUNTER — Ambulatory Visit (INDEPENDENT_AMBULATORY_CARE_PROVIDER_SITE_OTHER): Payer: Medicare Other | Admitting: Cardiology

## 2022-03-24 ENCOUNTER — Encounter: Payer: Self-pay | Admitting: Cardiology

## 2022-03-24 VITALS — BP 134/78 | HR 56 | Ht 63.0 in | Wt 178.0 lb

## 2022-03-24 DIAGNOSIS — M546 Pain in thoracic spine: Secondary | ICD-10-CM | POA: Diagnosis not present

## 2022-03-24 DIAGNOSIS — Z79899 Other long term (current) drug therapy: Secondary | ICD-10-CM

## 2022-03-24 DIAGNOSIS — M4723 Other spondylosis with radiculopathy, cervicothoracic region: Secondary | ICD-10-CM | POA: Diagnosis not present

## 2022-03-24 DIAGNOSIS — I10 Essential (primary) hypertension: Secondary | ICD-10-CM

## 2022-03-24 DIAGNOSIS — Z7901 Long term (current) use of anticoagulants: Secondary | ICD-10-CM

## 2022-03-24 DIAGNOSIS — M9902 Segmental and somatic dysfunction of thoracic region: Secondary | ICD-10-CM | POA: Diagnosis not present

## 2022-03-24 DIAGNOSIS — I480 Paroxysmal atrial fibrillation: Secondary | ICD-10-CM | POA: Diagnosis not present

## 2022-03-24 DIAGNOSIS — M9901 Segmental and somatic dysfunction of cervical region: Secondary | ICD-10-CM | POA: Diagnosis not present

## 2022-03-24 MED ORDER — METOPROLOL SUCCINATE ER 25 MG PO TB24: 12.5000 mg | ORAL_TABLET | Freq: Every day | ORAL | 3 refills | Status: DC

## 2022-03-24 NOTE — Patient Instructions (Signed)
Medication Instructions:  ?Your physician has recommended you make the following change in your medication:  ? ?START: Metoprolol succinate 12.5 mg daily ? ?*If you need a refill on your cardiac medications before your next appointment, please call your pharmacy* ? ? ?Lab Work: ?None ?If you have labs (blood work) drawn today and your tests are completely normal, you will receive your results only by: ?MyChart Message (if you have MyChart) OR ?A paper copy in the mail ?If you have any lab test that is abnormal or we need to change your treatment, we will call you to review the results. ? ? ?Testing/Procedures: ?None ? ? ?Follow-Up: ?At [facility], you and your health needs are our priority.  As part of our continuing mission to provide you with exceptional heart care, we have created designated Provider Care Teams.  These Care Teams include your primary Cardiologist (physician) and Advanced Practice Providers (APPs -  Physician Assistants and Nurse Practitioners) who all work together to provide you with the care you need, when you need it. ? ?We recommend signing up for the patient portal called "MyChart".  Sign up information is provided on this After Visit Summary.  MyChart is used to connect with patients for Virtual Visits (Telemedicine).  Patients are able to view lab/test results, encounter notes, upcoming appointments, etc.  Non-urgent messages can be sent to your provider as well.   ?To learn more about what you can do with MyChart, go to [email or web address].   ? ?Your next appointment:   ?9 month(s) ? ?The format for your next appointment:   ?In Person ? ?Provider:   ?Shirlee More, MD  ? ? ?Other Instructions ?If your heart rate remains less than 60 reduce the metoprolol succinate to every other day. ? ?Important Information About Sugar ? ? ? ? ? ? ?

## 2022-03-26 DIAGNOSIS — M9901 Segmental and somatic dysfunction of cervical region: Secondary | ICD-10-CM | POA: Diagnosis not present

## 2022-03-26 DIAGNOSIS — M546 Pain in thoracic spine: Secondary | ICD-10-CM | POA: Diagnosis not present

## 2022-03-26 DIAGNOSIS — M9902 Segmental and somatic dysfunction of thoracic region: Secondary | ICD-10-CM | POA: Diagnosis not present

## 2022-03-26 DIAGNOSIS — M4723 Other spondylosis with radiculopathy, cervicothoracic region: Secondary | ICD-10-CM | POA: Diagnosis not present

## 2022-03-28 DIAGNOSIS — M9901 Segmental and somatic dysfunction of cervical region: Secondary | ICD-10-CM | POA: Diagnosis not present

## 2022-03-28 DIAGNOSIS — M9902 Segmental and somatic dysfunction of thoracic region: Secondary | ICD-10-CM | POA: Diagnosis not present

## 2022-03-28 DIAGNOSIS — M546 Pain in thoracic spine: Secondary | ICD-10-CM | POA: Diagnosis not present

## 2022-03-28 DIAGNOSIS — M4723 Other spondylosis with radiculopathy, cervicothoracic region: Secondary | ICD-10-CM | POA: Diagnosis not present

## 2022-03-31 DIAGNOSIS — M4723 Other spondylosis with radiculopathy, cervicothoracic region: Secondary | ICD-10-CM | POA: Diagnosis not present

## 2022-03-31 DIAGNOSIS — M9902 Segmental and somatic dysfunction of thoracic region: Secondary | ICD-10-CM | POA: Diagnosis not present

## 2022-03-31 DIAGNOSIS — M9901 Segmental and somatic dysfunction of cervical region: Secondary | ICD-10-CM | POA: Diagnosis not present

## 2022-03-31 DIAGNOSIS — M546 Pain in thoracic spine: Secondary | ICD-10-CM | POA: Diagnosis not present

## 2022-04-02 DIAGNOSIS — M546 Pain in thoracic spine: Secondary | ICD-10-CM | POA: Diagnosis not present

## 2022-04-02 DIAGNOSIS — M4723 Other spondylosis with radiculopathy, cervicothoracic region: Secondary | ICD-10-CM | POA: Diagnosis not present

## 2022-04-02 DIAGNOSIS — M9902 Segmental and somatic dysfunction of thoracic region: Secondary | ICD-10-CM | POA: Diagnosis not present

## 2022-04-02 DIAGNOSIS — M9901 Segmental and somatic dysfunction of cervical region: Secondary | ICD-10-CM | POA: Diagnosis not present

## 2022-04-04 DIAGNOSIS — M9902 Segmental and somatic dysfunction of thoracic region: Secondary | ICD-10-CM | POA: Diagnosis not present

## 2022-04-04 DIAGNOSIS — M9901 Segmental and somatic dysfunction of cervical region: Secondary | ICD-10-CM | POA: Diagnosis not present

## 2022-04-04 DIAGNOSIS — M4723 Other spondylosis with radiculopathy, cervicothoracic region: Secondary | ICD-10-CM | POA: Diagnosis not present

## 2022-04-04 DIAGNOSIS — M546 Pain in thoracic spine: Secondary | ICD-10-CM | POA: Diagnosis not present

## 2022-04-05 ENCOUNTER — Other Ambulatory Visit: Payer: Self-pay | Admitting: Medical-Surgical

## 2022-04-06 ENCOUNTER — Other Ambulatory Visit: Payer: Self-pay | Admitting: Cardiology

## 2022-04-07 DIAGNOSIS — M546 Pain in thoracic spine: Secondary | ICD-10-CM | POA: Diagnosis not present

## 2022-04-07 DIAGNOSIS — M9902 Segmental and somatic dysfunction of thoracic region: Secondary | ICD-10-CM | POA: Diagnosis not present

## 2022-04-07 DIAGNOSIS — M9901 Segmental and somatic dysfunction of cervical region: Secondary | ICD-10-CM | POA: Diagnosis not present

## 2022-04-07 DIAGNOSIS — M4723 Other spondylosis with radiculopathy, cervicothoracic region: Secondary | ICD-10-CM | POA: Diagnosis not present

## 2022-04-07 NOTE — Telephone Encounter (Signed)
Prescription refill request for Eliquis received. ?Indication:Afib ?Last office visit:5/23 ?Scr:0.5 ?Age: 71 ?Weight:80.7 kg ? ?Prescription refilled ? ?

## 2022-04-09 DIAGNOSIS — M546 Pain in thoracic spine: Secondary | ICD-10-CM | POA: Diagnosis not present

## 2022-04-09 DIAGNOSIS — M4723 Other spondylosis with radiculopathy, cervicothoracic region: Secondary | ICD-10-CM | POA: Diagnosis not present

## 2022-04-09 DIAGNOSIS — M9901 Segmental and somatic dysfunction of cervical region: Secondary | ICD-10-CM | POA: Diagnosis not present

## 2022-04-09 DIAGNOSIS — M9902 Segmental and somatic dysfunction of thoracic region: Secondary | ICD-10-CM | POA: Diagnosis not present

## 2022-04-11 DIAGNOSIS — M546 Pain in thoracic spine: Secondary | ICD-10-CM | POA: Diagnosis not present

## 2022-04-11 DIAGNOSIS — M4723 Other spondylosis with radiculopathy, cervicothoracic region: Secondary | ICD-10-CM | POA: Diagnosis not present

## 2022-04-11 DIAGNOSIS — M9901 Segmental and somatic dysfunction of cervical region: Secondary | ICD-10-CM | POA: Diagnosis not present

## 2022-04-11 DIAGNOSIS — M9902 Segmental and somatic dysfunction of thoracic region: Secondary | ICD-10-CM | POA: Diagnosis not present

## 2022-04-14 DIAGNOSIS — H5203 Hypermetropia, bilateral: Secondary | ICD-10-CM | POA: Diagnosis not present

## 2022-04-14 DIAGNOSIS — H524 Presbyopia: Secondary | ICD-10-CM | POA: Diagnosis not present

## 2022-04-14 DIAGNOSIS — M4723 Other spondylosis with radiculopathy, cervicothoracic region: Secondary | ICD-10-CM | POA: Diagnosis not present

## 2022-04-14 DIAGNOSIS — H04123 Dry eye syndrome of bilateral lacrimal glands: Secondary | ICD-10-CM | POA: Diagnosis not present

## 2022-04-14 DIAGNOSIS — M9901 Segmental and somatic dysfunction of cervical region: Secondary | ICD-10-CM | POA: Diagnosis not present

## 2022-04-14 DIAGNOSIS — H25813 Combined forms of age-related cataract, bilateral: Secondary | ICD-10-CM | POA: Diagnosis not present

## 2022-04-14 DIAGNOSIS — M9902 Segmental and somatic dysfunction of thoracic region: Secondary | ICD-10-CM | POA: Diagnosis not present

## 2022-04-14 DIAGNOSIS — M546 Pain in thoracic spine: Secondary | ICD-10-CM | POA: Diagnosis not present

## 2022-04-14 DIAGNOSIS — H16203 Unspecified keratoconjunctivitis, bilateral: Secondary | ICD-10-CM | POA: Diagnosis not present

## 2022-04-16 DIAGNOSIS — M9901 Segmental and somatic dysfunction of cervical region: Secondary | ICD-10-CM | POA: Diagnosis not present

## 2022-04-16 DIAGNOSIS — M9902 Segmental and somatic dysfunction of thoracic region: Secondary | ICD-10-CM | POA: Diagnosis not present

## 2022-04-16 DIAGNOSIS — M4723 Other spondylosis with radiculopathy, cervicothoracic region: Secondary | ICD-10-CM | POA: Diagnosis not present

## 2022-04-16 DIAGNOSIS — M546 Pain in thoracic spine: Secondary | ICD-10-CM | POA: Diagnosis not present

## 2022-04-18 DIAGNOSIS — M9902 Segmental and somatic dysfunction of thoracic region: Secondary | ICD-10-CM | POA: Diagnosis not present

## 2022-04-18 DIAGNOSIS — M546 Pain in thoracic spine: Secondary | ICD-10-CM | POA: Diagnosis not present

## 2022-04-18 DIAGNOSIS — M4723 Other spondylosis with radiculopathy, cervicothoracic region: Secondary | ICD-10-CM | POA: Diagnosis not present

## 2022-04-18 DIAGNOSIS — M9901 Segmental and somatic dysfunction of cervical region: Secondary | ICD-10-CM | POA: Diagnosis not present

## 2022-04-22 DIAGNOSIS — M4723 Other spondylosis with radiculopathy, cervicothoracic region: Secondary | ICD-10-CM | POA: Diagnosis not present

## 2022-04-22 DIAGNOSIS — M546 Pain in thoracic spine: Secondary | ICD-10-CM | POA: Diagnosis not present

## 2022-04-22 DIAGNOSIS — M9901 Segmental and somatic dysfunction of cervical region: Secondary | ICD-10-CM | POA: Diagnosis not present

## 2022-04-22 DIAGNOSIS — M9902 Segmental and somatic dysfunction of thoracic region: Secondary | ICD-10-CM | POA: Diagnosis not present

## 2022-04-23 DIAGNOSIS — M9902 Segmental and somatic dysfunction of thoracic region: Secondary | ICD-10-CM | POA: Diagnosis not present

## 2022-04-23 DIAGNOSIS — M4723 Other spondylosis with radiculopathy, cervicothoracic region: Secondary | ICD-10-CM | POA: Diagnosis not present

## 2022-04-23 DIAGNOSIS — M546 Pain in thoracic spine: Secondary | ICD-10-CM | POA: Diagnosis not present

## 2022-04-23 DIAGNOSIS — M9901 Segmental and somatic dysfunction of cervical region: Secondary | ICD-10-CM | POA: Diagnosis not present

## 2022-04-25 DIAGNOSIS — M546 Pain in thoracic spine: Secondary | ICD-10-CM | POA: Diagnosis not present

## 2022-04-25 DIAGNOSIS — M9902 Segmental and somatic dysfunction of thoracic region: Secondary | ICD-10-CM | POA: Diagnosis not present

## 2022-04-25 DIAGNOSIS — M9901 Segmental and somatic dysfunction of cervical region: Secondary | ICD-10-CM | POA: Diagnosis not present

## 2022-04-25 DIAGNOSIS — M4723 Other spondylosis with radiculopathy, cervicothoracic region: Secondary | ICD-10-CM | POA: Diagnosis not present

## 2022-04-28 DIAGNOSIS — M9901 Segmental and somatic dysfunction of cervical region: Secondary | ICD-10-CM | POA: Diagnosis not present

## 2022-04-28 DIAGNOSIS — M546 Pain in thoracic spine: Secondary | ICD-10-CM | POA: Diagnosis not present

## 2022-04-28 DIAGNOSIS — M4723 Other spondylosis with radiculopathy, cervicothoracic region: Secondary | ICD-10-CM | POA: Diagnosis not present

## 2022-04-28 DIAGNOSIS — M9902 Segmental and somatic dysfunction of thoracic region: Secondary | ICD-10-CM | POA: Diagnosis not present

## 2022-04-30 DIAGNOSIS — M9902 Segmental and somatic dysfunction of thoracic region: Secondary | ICD-10-CM | POA: Diagnosis not present

## 2022-04-30 DIAGNOSIS — M9901 Segmental and somatic dysfunction of cervical region: Secondary | ICD-10-CM | POA: Diagnosis not present

## 2022-04-30 DIAGNOSIS — M546 Pain in thoracic spine: Secondary | ICD-10-CM | POA: Diagnosis not present

## 2022-04-30 DIAGNOSIS — M4723 Other spondylosis with radiculopathy, cervicothoracic region: Secondary | ICD-10-CM | POA: Diagnosis not present

## 2022-05-01 DIAGNOSIS — Z23 Encounter for immunization: Secondary | ICD-10-CM | POA: Diagnosis not present

## 2022-05-06 DIAGNOSIS — M4723 Other spondylosis with radiculopathy, cervicothoracic region: Secondary | ICD-10-CM | POA: Diagnosis not present

## 2022-05-06 DIAGNOSIS — M546 Pain in thoracic spine: Secondary | ICD-10-CM | POA: Diagnosis not present

## 2022-05-06 DIAGNOSIS — M9901 Segmental and somatic dysfunction of cervical region: Secondary | ICD-10-CM | POA: Diagnosis not present

## 2022-05-06 DIAGNOSIS — M9902 Segmental and somatic dysfunction of thoracic region: Secondary | ICD-10-CM | POA: Diagnosis not present

## 2022-05-07 ENCOUNTER — Other Ambulatory Visit: Payer: Self-pay | Admitting: Medical-Surgical

## 2022-05-08 DIAGNOSIS — M546 Pain in thoracic spine: Secondary | ICD-10-CM | POA: Diagnosis not present

## 2022-05-08 DIAGNOSIS — M9901 Segmental and somatic dysfunction of cervical region: Secondary | ICD-10-CM | POA: Diagnosis not present

## 2022-05-08 DIAGNOSIS — M9902 Segmental and somatic dysfunction of thoracic region: Secondary | ICD-10-CM | POA: Diagnosis not present

## 2022-05-08 DIAGNOSIS — M4723 Other spondylosis with radiculopathy, cervicothoracic region: Secondary | ICD-10-CM | POA: Diagnosis not present

## 2022-05-09 ENCOUNTER — Other Ambulatory Visit: Payer: Self-pay | Admitting: Medical-Surgical

## 2022-05-13 DIAGNOSIS — M9902 Segmental and somatic dysfunction of thoracic region: Secondary | ICD-10-CM | POA: Diagnosis not present

## 2022-05-13 DIAGNOSIS — M4723 Other spondylosis with radiculopathy, cervicothoracic region: Secondary | ICD-10-CM | POA: Diagnosis not present

## 2022-05-13 DIAGNOSIS — M546 Pain in thoracic spine: Secondary | ICD-10-CM | POA: Diagnosis not present

## 2022-05-13 DIAGNOSIS — M9901 Segmental and somatic dysfunction of cervical region: Secondary | ICD-10-CM | POA: Diagnosis not present

## 2022-05-17 DIAGNOSIS — I4891 Unspecified atrial fibrillation: Secondary | ICD-10-CM | POA: Diagnosis not present

## 2022-05-17 DIAGNOSIS — Z7901 Long term (current) use of anticoagulants: Secondary | ICD-10-CM | POA: Diagnosis not present

## 2022-05-17 DIAGNOSIS — K5730 Diverticulosis of large intestine without perforation or abscess without bleeding: Secondary | ICD-10-CM | POA: Diagnosis not present

## 2022-05-17 DIAGNOSIS — R519 Headache, unspecified: Secondary | ICD-10-CM | POA: Diagnosis not present

## 2022-05-17 DIAGNOSIS — R197 Diarrhea, unspecified: Secondary | ICD-10-CM | POA: Diagnosis not present

## 2022-05-17 DIAGNOSIS — K219 Gastro-esophageal reflux disease without esophagitis: Secondary | ICD-10-CM | POA: Diagnosis not present

## 2022-05-17 DIAGNOSIS — K5792 Diverticulitis of intestine, part unspecified, without perforation or abscess without bleeding: Secondary | ICD-10-CM | POA: Diagnosis not present

## 2022-05-17 DIAGNOSIS — R1031 Right lower quadrant pain: Secondary | ICD-10-CM | POA: Diagnosis not present

## 2022-05-17 DIAGNOSIS — E7800 Pure hypercholesterolemia, unspecified: Secondary | ICD-10-CM | POA: Diagnosis not present

## 2022-05-17 DIAGNOSIS — R1032 Left lower quadrant pain: Secondary | ICD-10-CM | POA: Diagnosis not present

## 2022-05-17 DIAGNOSIS — R509 Fever, unspecified: Secondary | ICD-10-CM | POA: Diagnosis not present

## 2022-05-17 DIAGNOSIS — K760 Fatty (change of) liver, not elsewhere classified: Secondary | ICD-10-CM | POA: Diagnosis not present

## 2022-05-17 DIAGNOSIS — Z7982 Long term (current) use of aspirin: Secondary | ICD-10-CM | POA: Diagnosis not present

## 2022-05-17 DIAGNOSIS — I209 Angina pectoris, unspecified: Secondary | ICD-10-CM | POA: Diagnosis not present

## 2022-05-17 DIAGNOSIS — R110 Nausea: Secondary | ICD-10-CM | POA: Diagnosis not present

## 2022-05-17 DIAGNOSIS — Z79899 Other long term (current) drug therapy: Secondary | ICD-10-CM | POA: Diagnosis not present

## 2022-05-17 DIAGNOSIS — Z882 Allergy status to sulfonamides status: Secondary | ICD-10-CM | POA: Diagnosis not present

## 2022-05-20 ENCOUNTER — Encounter: Payer: Self-pay | Admitting: Medical-Surgical

## 2022-05-20 ENCOUNTER — Telehealth: Payer: Self-pay | Admitting: Gastroenterology

## 2022-05-22 ENCOUNTER — Encounter: Payer: Self-pay | Admitting: Gastroenterology

## 2022-05-22 ENCOUNTER — Ambulatory Visit (INDEPENDENT_AMBULATORY_CARE_PROVIDER_SITE_OTHER): Payer: Medicare Other | Admitting: Gastroenterology

## 2022-05-22 VITALS — BP 128/76 | HR 62 | Ht 63.0 in | Wt 172.6 lb

## 2022-05-22 DIAGNOSIS — K581 Irritable bowel syndrome with constipation: Secondary | ICD-10-CM

## 2022-05-22 DIAGNOSIS — K5730 Diverticulosis of large intestine without perforation or abscess without bleeding: Secondary | ICD-10-CM

## 2022-05-22 DIAGNOSIS — K5732 Diverticulitis of large intestine without perforation or abscess without bleeding: Secondary | ICD-10-CM

## 2022-05-22 DIAGNOSIS — R140 Abdominal distension (gaseous): Secondary | ICD-10-CM

## 2022-05-22 DIAGNOSIS — D259 Leiomyoma of uterus, unspecified: Secondary | ICD-10-CM

## 2022-05-22 MED ORDER — DICYCLOMINE HCL 10 MG PO CAPS: 10.0000 mg | ORAL_CAPSULE | Freq: Four times a day (QID) | ORAL | 3 refills | Status: DC | PRN

## 2022-05-22 NOTE — Patient Instructions (Addendum)
We have sent the following medications to your pharmacy for you to pick up at your convenience: Bentyl  Please purchase the following medications over the counter and take as directed: Probiotics for 4 weeks.  You will be contacted by [facility]  to schedule a consult.  [place], [place], [place] [address], [phone]  Please follow up in 6 months. Give [place] a call at [phone] to schedule an appointment.   If you are age 72 or older, your body mass index should be between 23-30. Your Body mass index is 30.57 kg/m. If this is out of the aforementioned range listed, please consider follow up with your Primary Care Provider.  __________________________________________________________  The [facility] providers would like to encourage you to use [facility] to communicate with providers for non-urgent requests or questions.  Due to long hold times on the telephone, sending your provider a message by [facility] may be a faster and more efficient way to get a response.  Please allow 48 business hours for a response.  Please remember that this is for non-urgent requests.   Due to recent changes in healthcare laws, you may see the results of your imaging and laboratory studies on MyChart before your provider has had a chance to review them.  We understand that in some cases there may be results that are confusing or concerning to you. Not all laboratory results come back in the same time frame and the provider may be waiting for multiple results in order to interpret others.  Please give [place] 48 hours in order for your provider to thoroughly review all the results before contacting the office for clarification of your results.     Thank you for choosing me and [facility].  [name], D.O.

## 2022-05-22 NOTE — Progress Notes (Signed)
Chief Complaint:    Diverticulitis  GI History: Melissa Rodgers is a 71 y.o. female with a history of paroxysmal atrial fibrillation (flecainide, Eliquis), hypertension, GERD, low back pain, who follows in the GI Clinic for diverticular disease, IBS, and GERD.  Previously followed with [facility], established care with me in 02/2020.   1) Diverticulosis: Patient with episode of diverticulitis in 07/2019 -CT 07/2019: Severe, acute sigmoid diverticulitis; treated with antibiotics -Colonoscopy 09/2019 ([facility]): moderate diverticulosis of the descending and sigmoid colon, lipoma, internal hemorrhoids, and inflammatory polyp of the sigmoid colon -CT 12/05/2019: Normal, resolution of sigmoid diverticulitis.  Treated with 14-day course of Augmentin for LLQ tenderness -CT 01/17/2020: Mild diverticulosis without diverticulitis, stable uterine fibroid   2) IBS-C:  Intermittent abdominal cramping and constipation, with occasional overflow diarrhea.  Improved with MiraLAX and Benefiber. Uses Gas-X for gas/bloating.  History of epigastric pain resolved with clonazepam as needed. - 09/17/2021: Recommended trial Linzess 72 mcg/day, but she never started   3) GERD: History of GERD, with previous hiatal hernia repair and Nissen fundoplication in 6759.  Reflux now well controlled, no longer needs any acid suppression therapy. -10/08/2021: EGD: Intact Nissen fundoplication.   Otherwise normal esophagus and duodenum  HPI:     Patient is a 71 y.o. female presenting to the Gastroenterology Clinic for follow-up.  Last seen by me on 09/17/2021 for evaluation of postprandial LUQ pain.  No associated heartburn, regurgitation. -10/08/2021: EGD: Intact Nissen fundoplication.   Otherwise normal esophagus and duodenum  Was in [place], [place] and had abdominal bloating x weeks, then developed fever 101.5. Went to ER on 05/17/2022.  I personally reviewed copy of ER records today.  - WBC 16.4, H/H 14/42.5, PLT 261 - Normal CMP - CRP  119 (ULN 8) - CT A/P: Distal sigmoid diverticulitis w/o abscess. Fundoplication, fatty liver, uterine fibroids - Diagnosed with uncomplicated diverticulitis and discharged home with Augmentin x7 days   Currently taking Augmentin as prescribed. Feeling better now and afebrile. Back to eating soft foods to take it slowly. No n/v. Stools are loose, but no hematochezia, melena.   History of diverticulitis in 07/2019: - 07/31/2019: CT A/P: Severe acute sigmoid diverticulitis without abscess. - 12/05/2019: CT A/P: Sigmoid diverticulosis with resolution of previous diverticulitis.  No ongoing inflammation or obstruction.  Small gastric diverticulum just below the EG junction - 01/17/2020: CT A/P: Mild diverticulosis and stable uterine fibroid  Last colonoscopy was 10/04/2019 at [facility] after diverticulitis.  He does report not available for review in EMR or on the patient's phone.  We will request records today.  Prior to diverticulitis, stools had been soft with fiber supplement and intermittent Miralax. But still with intermittent abdominal bloating.  Review of systems:     No chest pain, no SOB, no fevers, no urinary sx   Past Medical History:  Diagnosis Date   Allergy    seasonal   Anemia    Atrial fibrillation 06/18/2019   BMI 30.0-30.9,adult 01/27/2013   Candidosis of skin 04/17/2016   Formatting of this note might be different from the original. 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   Dermal nevus of left forearm 04/17/2016   Diverticulitis of sigmoid colon 08/01/2019   Dry eye syndrome of both eyes 09/09/2018   Dry skin 04/17/2016   Dysrhythmia    Afib  Essential hypertension 09/09/2018   Fibromyalgia    Gastroesophageal reflux disease without esophagitis 11/06/2015   Generalized  anxiety disorder with panic attacks 08/01/2019   History of palpitations 09/09/2018   Hyperlipidemia 09/09/2018   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   Knee pain, acute 03/05/2020   Left knee DJD 03/27/2014   Major depressive disorder 09/09/2018   Microscopic hematuria 09/09/2018   Multiple benign nevi 04/17/2016   Non-seasonal allergic rhinitis 09/09/2018   Osteoarthritis    Palpitations 09/28/2013   Formatting of this note is different from the original. 01/29/15:  24 Hour Holter Monitor CONCLUSIONS: 1. Overall normal 24-hour Holter monitor recording, with rare, brief,  asymptomatic nonsustained supraventricular tachycardia as described.  2. No cardiac rhythm explanation for patient's symptoms.       Primary osteoarthritis of both knees 10/07/2016   Formatting of this note might be different from the original. 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   Primary osteoarthritis of right knee 11/21/2014   S/P laparoscopic fundoplication 16/05/3709   Scar condition and fibrosis of skin 04/17/2016   Formatting of this note might be different from the original. lower abdomen   Sebaceous hyperplasia 04/17/2016   Seborrheic keratoses 04/17/2016   Skin tags, multiple acquired 04/17/2016   Solar lentigo 04/17/2016   Subarachnoid hemorrhage following injury [facility]) 2010   Fall from horse; brief hospitalization    Patient's surgical history, family medical history, social history, medications and allergies were all reviewed in Epic    Current Outpatient Medications  Medication Sig Dispense Refill   amoxicillin-clavulanate (AUGMENTIN) 875-125 MG tablet Take 1 tablet by mouth 2 (two) times daily.     cetirizine (ZYRTEC) 10 MG tablet Take 10 mg by mouth daily.     Cyanocobalamin (B-12 PO) Take 1 tablet by mouth daily.     diltiazem (CARDIZEM) 30 MG tablet Take 1 tablet (30 mg total) by mouth every 4 (four) hours as needed. (Patient taking differently: Take 30 mg by mouth  every 4 (four) hours as needed (Atrial Fib).) 90 tablet 3   ELIQUIS 5 MG TABS tablet TAKE 1 TABLET TWICE A DAY 180 tablet 1   flecainide (TAMBOCOR) 50 MG tablet TAKE 1 TABLET(50 MG) BY MOUTH TWICE DAILY 180 tablet 2   FLUoxetine (PROZAC) 20 MG tablet TAKE 1 TABLET(20 MG) BY MOUTH DAILY 90 tablet 1   gabapentin (NEURONTIN) 100 MG capsule TAKE 2 CAPSULES(200 MG) BY MOUTH THREE TIMES DAILY 180 capsule 0   metoprolol succinate (TOPROL XL) 25 MG 24 hr tablet Take 0.5 tablets (12.5 mg total) by mouth daily. 45 tablet 3   Cholecalciferol (VITAMIN D-3) 1000 units CAPS Take 1,000 Units by mouth daily. (Patient not taking: Reported on 05/22/2022)     dexamethasone 0.5 MG/5ML elixir Take 5 mLs (0.5 mg total) by mouth 3 (three) times daily as needed (aphthous ulcers that do not respond to other treatments). (Patient not taking: Reported on 05/22/2022) 100 mL 0   No current facility-administered medications for this visit.    Physical Exam:     BP 128/76   Pulse 62   Ht '5\' 3"'$  (1.6 m)   Wt 172 lb 9.6 oz (78.3 kg)   SpO2 98%   BMI 30.57 kg/m   GENERAL:  Pleasant female in NAD Musculoskeletal:  Normal muscle tone, normal strength NEURO: Alert and oriented x 3, no focal neurologic deficits   IMPRESSION and PLAN:    1) Diverticulitis - Complete Augmentin as prescribed - Clinically improving and  back to tolerating p.o. intake - Start probiotic and take 4 weeks beyond completion of Abx course - Will request report from colonoscopy in 09/2019 from [facility] to determine if repeat colonoscopy needed now since she has had a 2nd episode of diverticulitis.  If repeat needed, allow 6-8 weeks prior to scheduling  2) IBS 3) Abdominal bloating - Low FODMAP diet.  Provided with handout and detailed instruction today - Bentyl 10 mg PO prn Q6H as needed - High fiber diet - Continue adequate hydration - Gas-X or Beano OTC prn okay to use  4) Uterine fibroids - Referral to Gyn in [place] for evaluation   I spent  30 minutes of time, including in depth chart review, independent review of results as outlined above, communicating results with the patient directly, face-to-face time with the patient, coordinating care, and ordering studies and medications as appropriate, and documentation.          [place] ,DO, FACG 05/22/2022, 10:20 AM

## 2022-05-26 DIAGNOSIS — M546 Pain in thoracic spine: Secondary | ICD-10-CM | POA: Diagnosis not present

## 2022-05-26 DIAGNOSIS — M4723 Other spondylosis with radiculopathy, cervicothoracic region: Secondary | ICD-10-CM | POA: Diagnosis not present

## 2022-05-26 DIAGNOSIS — M9902 Segmental and somatic dysfunction of thoracic region: Secondary | ICD-10-CM | POA: Diagnosis not present

## 2022-05-26 DIAGNOSIS — M9901 Segmental and somatic dysfunction of cervical region: Secondary | ICD-10-CM | POA: Diagnosis not present

## 2022-05-29 ENCOUNTER — Telehealth: Payer: Self-pay | Admitting: Gastroenterology

## 2022-05-29 DIAGNOSIS — M4723 Other spondylosis with radiculopathy, cervicothoracic region: Secondary | ICD-10-CM | POA: Diagnosis not present

## 2022-05-29 DIAGNOSIS — M9902 Segmental and somatic dysfunction of thoracic region: Secondary | ICD-10-CM | POA: Diagnosis not present

## 2022-05-29 DIAGNOSIS — M9901 Segmental and somatic dysfunction of cervical region: Secondary | ICD-10-CM | POA: Diagnosis not present

## 2022-05-29 DIAGNOSIS — M546 Pain in thoracic spine: Secondary | ICD-10-CM | POA: Diagnosis not present

## 2022-05-29 NOTE — Telephone Encounter (Signed)
Received colonoscopy report from [facility] from 10/04/2019 which was completed after acute diverticulitis in 07/2019.  Colonoscopy notable for diverticulosis in the descending and sigmoid colon of moderate severity, 2 mm benign sigmoid polyp (path: Polypoid granulation tissue with inflammation and reactive changes; negative for hyperplastic or adenomatous change), 1.5 cm benign lipoma in the ascending colon, internal hemorrhoids.  Per current guidelines, as she had a colonoscopy following her last episode of diverticulitis and that colonoscopy is within 3 years, do not feel that repeat colonoscopy is needed at this juncture.  Plan for repeat colonoscopy at 5-year interval (07/2024).  If she has another episode of diverticulitis, would likely elect to change that interval.

## 2022-06-03 DIAGNOSIS — M546 Pain in thoracic spine: Secondary | ICD-10-CM | POA: Diagnosis not present

## 2022-06-03 DIAGNOSIS — M9901 Segmental and somatic dysfunction of cervical region: Secondary | ICD-10-CM | POA: Diagnosis not present

## 2022-06-03 DIAGNOSIS — M4723 Other spondylosis with radiculopathy, cervicothoracic region: Secondary | ICD-10-CM | POA: Diagnosis not present

## 2022-06-03 DIAGNOSIS — M9902 Segmental and somatic dysfunction of thoracic region: Secondary | ICD-10-CM | POA: Diagnosis not present

## 2022-06-06 DIAGNOSIS — M9901 Segmental and somatic dysfunction of cervical region: Secondary | ICD-10-CM | POA: Diagnosis not present

## 2022-06-06 DIAGNOSIS — M546 Pain in thoracic spine: Secondary | ICD-10-CM | POA: Diagnosis not present

## 2022-06-06 DIAGNOSIS — M4723 Other spondylosis with radiculopathy, cervicothoracic region: Secondary | ICD-10-CM | POA: Diagnosis not present

## 2022-06-06 DIAGNOSIS — M9902 Segmental and somatic dysfunction of thoracic region: Secondary | ICD-10-CM | POA: Diagnosis not present

## 2022-06-08 NOTE — Progress Notes (Unsigned)
   Established Patient Office Visit  Subjective   Patient ID: Melissa Rodgers, female   DOB: 1951-09-11 Age: 71 y.o. MRN: [id number]   No chief complaint on file.   HPI Pleasant 71 year old female presenting today for the following:  HTN:  Afib:  GERD:  Fibromyalgia:  Mood:  ROS    Objective:    There were no vitals filed for this visit.   Physical Exam    No results found for this or any previous visit (from the past 24 hour(s)).   {Labs (Optional):23779}  The 10-year ASCVD risk score (Arnett DK, et al., 2019) is: 14.6%   Values used to calculate the score:     Age: 32 years     Sex: Female     Is Non-Hispanic African American: No     Diabetic: No     Tobacco smoker: No     Systolic Blood Pressure: 616 mmHg     Is BP treated: Yes     HDL Cholesterol: 42 mg/dL     Total Cholesterol: 204 mg/dL   Assessment & Plan:   No problem-specific Assessment & Plan notes found for this encounter.   No follow-ups on file.  ___________________________________________ Clearnce Sorrel, DNP, APRN, FNP-BC [facility] and [place]

## 2022-06-09 ENCOUNTER — Encounter: Payer: Self-pay | Admitting: Medical-Surgical

## 2022-06-09 ENCOUNTER — Ambulatory Visit (INDEPENDENT_AMBULATORY_CARE_PROVIDER_SITE_OTHER): Payer: Medicare Other | Admitting: Medical-Surgical

## 2022-06-09 VITALS — BP 110/76 | HR 62 | Resp 20 | Ht 63.0 in | Wt 169.1 lb

## 2022-06-09 DIAGNOSIS — I480 Paroxysmal atrial fibrillation: Secondary | ICD-10-CM | POA: Diagnosis not present

## 2022-06-09 DIAGNOSIS — I10 Essential (primary) hypertension: Secondary | ICD-10-CM

## 2022-06-09 DIAGNOSIS — R739 Hyperglycemia, unspecified: Secondary | ICD-10-CM | POA: Insufficient documentation

## 2022-06-09 DIAGNOSIS — F410 Panic disorder [episodic paroxysmal anxiety] without agoraphobia: Secondary | ICD-10-CM | POA: Diagnosis not present

## 2022-06-09 DIAGNOSIS — K5792 Diverticulitis of intestine, part unspecified, without perforation or abscess without bleeding: Secondary | ICD-10-CM

## 2022-06-09 DIAGNOSIS — K219 Gastro-esophageal reflux disease without esophagitis: Secondary | ICD-10-CM | POA: Diagnosis not present

## 2022-06-09 DIAGNOSIS — Z833 Family history of diabetes mellitus: Secondary | ICD-10-CM | POA: Diagnosis not present

## 2022-06-09 DIAGNOSIS — F411 Generalized anxiety disorder: Secondary | ICD-10-CM

## 2022-06-09 DIAGNOSIS — M797 Fibromyalgia: Secondary | ICD-10-CM

## 2022-06-09 HISTORY — DX: Family history of diabetes mellitus: Z83.3

## 2022-06-09 NOTE — Assessment & Plan Note (Signed)
Symptoms stable and well-controlled on fluoxetine 20 mg daily.  Continue medication as prescribed.

## 2022-06-09 NOTE — Assessment & Plan Note (Signed)
Blood pressure looks great today at 110/76.  Checking labs today.  Continue monitoring blood pressure at home with goal of 130/80 or less.  Follow-up with cardiology as scheduled.

## 2022-06-09 NOTE — Assessment & Plan Note (Signed)
Recently checked a CRP at 119.  Rechecking CRP today to see if this is come down now that diverticulitis has fully resolved.

## 2022-06-09 NOTE — Assessment & Plan Note (Signed)
>>  ASSESSMENT AND PLAN FOR DIVERTICULITIS WRITTEN ON 06/09/2022  6:30 PM BY Selina Tapper, NP  Recently checked a CRP at 119.  Rechecking CRP today to see if this is come down now that diverticulitis has fully resolved.

## 2022-06-09 NOTE — Assessment & Plan Note (Signed)
Symptoms well controlled with use of gabapentin and chiropractic care.  Continue gabapentin 200 mg 3 times daily.

## 2022-06-09 NOTE — Assessment & Plan Note (Signed)
Checking hemoglobin A1c.

## 2022-06-09 NOTE — Assessment & Plan Note (Signed)
Continue management by cardiology.  Doing very well on Eliquis, Cardizem, and flecainide as prescribed.

## 2022-06-09 NOTE — Assessment & Plan Note (Signed)
Well-controlled after fundoplication.

## 2022-06-09 NOTE — Assessment & Plan Note (Signed)
Mild hyperglycemia on recent CMP from hospitalization.  Checking hemoglobin A1c for further evaluation.

## 2022-06-10 LAB — COMPLETE METABOLIC PANEL WITH GFR
AG Ratio: 1.5 (calc) (ref 1.0–2.5)
ALT: 15 U/L (ref 6–29)
AST: 19 U/L (ref 10–35)
Albumin: 4.4 g/dL (ref 3.6–5.1)
Alkaline phosphatase (APISO): 72 U/L (ref 37–153)
BUN: 15 mg/dL (ref 7–25)
CO2: 23 mmol/L (ref 20–32)
Calcium: 9.8 mg/dL (ref 8.6–10.4)
Chloride: 104 mmol/L (ref 98–110)
Creat: 0.61 mg/dL (ref 0.60–1.00)
Globulin: 2.9 g/dL (calc) (ref 1.9–3.7)
Glucose, Bld: 80 mg/dL (ref 65–99)
Potassium: 4.3 mmol/L (ref 3.5–5.3)
Sodium: 140 mmol/L (ref 135–146)
Total Bilirubin: 0.4 mg/dL (ref 0.2–1.2)
Total Protein: 7.3 g/dL (ref 6.1–8.1)
eGFR: 96 mL/min/{1.73_m2} (ref >=60)

## 2022-06-10 LAB — CBC WITH DIFFERENTIAL/PLATELET
Absolute Monocytes: 646 cells/uL (ref 200–950)
Basophils Absolute: 50 cells/uL (ref 0–200)
Basophils Relative: 0.7 %
Eosinophils Absolute: 391 cells/uL (ref 15–500)
Eosinophils Relative: 5.5 %
HCT: 44.9 % (ref 35.0–45.0)
Hemoglobin: 14.8 g/dL (ref 11.7–15.5)
Lymphs Abs: 1945 cells/uL (ref 850–3900)
MCH: 29.3 pg (ref 27.0–33.0)
MCHC: 33 g/dL (ref 32.0–36.0)
MCV: 88.9 fL (ref 80.0–100.0)
MPV: 9.9 fL (ref 7.5–12.5)
Monocytes Relative: 9.1 %
Neutro Abs: 4068 cells/uL (ref 1500–7800)
Neutrophils Relative %: 57.3 %
Platelets: 305 10*3/uL (ref 140–400)
RBC: 5.05 10*6/uL (ref 3.80–5.10)
RDW: 13.6 % (ref 11.0–15.0)
Total Lymphocyte: 27.4 %
WBC: 7.1 10*3/uL (ref 3.8–10.8)

## 2022-06-10 LAB — HEMOGLOBIN A1C
Hgb A1c MFr Bld: 5.7 % of total Hgb — ABNORMAL HIGH (ref <5.7)
Mean Plasma Glucose: 117 mg/dL
eAG (mmol/L): 6.5 mmol/L

## 2022-06-10 LAB — LIPID PANEL
Cholesterol: 217 mg/dL — ABNORMAL HIGH (ref <200)
HDL: 52 mg/dL (ref >=50)
LDL Cholesterol (Calc): 125 mg/dL (calc) — ABNORMAL HIGH
Non-HDL Cholesterol (Calc): 165 mg/dL (calc) — ABNORMAL HIGH (ref <130)
Total CHOL/HDL Ratio: 4.2 (calc) (ref <5.0)
Triglycerides: 245 mg/dL — ABNORMAL HIGH (ref <150)

## 2022-06-10 LAB — C-REACTIVE PROTEIN: CRP: 4.3 mg/L (ref <8.0)

## 2022-06-13 ENCOUNTER — Other Ambulatory Visit: Payer: Self-pay | Admitting: Medical-Surgical

## 2022-06-13 DIAGNOSIS — M9902 Segmental and somatic dysfunction of thoracic region: Secondary | ICD-10-CM | POA: Diagnosis not present

## 2022-06-13 DIAGNOSIS — M546 Pain in thoracic spine: Secondary | ICD-10-CM | POA: Diagnosis not present

## 2022-06-13 DIAGNOSIS — M9901 Segmental and somatic dysfunction of cervical region: Secondary | ICD-10-CM | POA: Diagnosis not present

## 2022-06-13 DIAGNOSIS — M4723 Other spondylosis with radiculopathy, cervicothoracic region: Secondary | ICD-10-CM | POA: Diagnosis not present

## 2022-06-20 DIAGNOSIS — M9902 Segmental and somatic dysfunction of thoracic region: Secondary | ICD-10-CM | POA: Diagnosis not present

## 2022-06-20 DIAGNOSIS — M4723 Other spondylosis with radiculopathy, cervicothoracic region: Secondary | ICD-10-CM | POA: Diagnosis not present

## 2022-06-20 DIAGNOSIS — M546 Pain in thoracic spine: Secondary | ICD-10-CM | POA: Diagnosis not present

## 2022-06-20 DIAGNOSIS — M9901 Segmental and somatic dysfunction of cervical region: Secondary | ICD-10-CM | POA: Diagnosis not present

## 2022-06-23 ENCOUNTER — Ambulatory Visit: Payer: Medicare Other | Admitting: Gastroenterology

## 2022-06-26 DIAGNOSIS — M9902 Segmental and somatic dysfunction of thoracic region: Secondary | ICD-10-CM | POA: Diagnosis not present

## 2022-06-26 DIAGNOSIS — M4723 Other spondylosis with radiculopathy, cervicothoracic region: Secondary | ICD-10-CM | POA: Diagnosis not present

## 2022-06-26 DIAGNOSIS — M546 Pain in thoracic spine: Secondary | ICD-10-CM | POA: Diagnosis not present

## 2022-06-26 DIAGNOSIS — M9901 Segmental and somatic dysfunction of cervical region: Secondary | ICD-10-CM | POA: Diagnosis not present

## 2022-07-03 DIAGNOSIS — M546 Pain in thoracic spine: Secondary | ICD-10-CM | POA: Diagnosis not present

## 2022-07-03 DIAGNOSIS — M9901 Segmental and somatic dysfunction of cervical region: Secondary | ICD-10-CM | POA: Diagnosis not present

## 2022-07-03 DIAGNOSIS — M4723 Other spondylosis with radiculopathy, cervicothoracic region: Secondary | ICD-10-CM | POA: Diagnosis not present

## 2022-07-03 DIAGNOSIS — M9902 Segmental and somatic dysfunction of thoracic region: Secondary | ICD-10-CM | POA: Diagnosis not present

## 2022-07-09 ENCOUNTER — Encounter: Payer: Self-pay | Admitting: Medical-Surgical

## 2022-07-10 DIAGNOSIS — M4723 Other spondylosis with radiculopathy, cervicothoracic region: Secondary | ICD-10-CM | POA: Diagnosis not present

## 2022-07-10 DIAGNOSIS — M9902 Segmental and somatic dysfunction of thoracic region: Secondary | ICD-10-CM | POA: Diagnosis not present

## 2022-07-10 DIAGNOSIS — M546 Pain in thoracic spine: Secondary | ICD-10-CM | POA: Diagnosis not present

## 2022-07-10 DIAGNOSIS — M9901 Segmental and somatic dysfunction of cervical region: Secondary | ICD-10-CM | POA: Diagnosis not present

## 2022-07-10 NOTE — Progress Notes (Signed)
   Acute Office Visit  Subjective:     Patient ID: Melissa Rodgers, female    DOB: October 07, 1951, 71 y.o.   MRN: [id number]  Chief Complaint  Patient presents with   Breast Mass    Rt breast mass onset 2 weeks     HPI Patient is in today for right breast lump. Does increase and decrease in size. Notes some pruritis of the right breast.  She describes a lump in her breast as pea-sized  Review of Systems  Constitutional:  Negative for chills and fever.  Respiratory:  Negative for cough and shortness of breath.   Cardiovascular:  Negative for chest pain.  Neurological:  Negative for headaches.      Objective:    BP 120/79 (BP Location: Right Arm)   Pulse 70   Temp 98.6 F (37 C)   SpO2 99%    Physical Exam Vitals and nursing note reviewed.  Constitutional:      General: She is not in acute distress.    Appearance: Normal appearance.  HENT:     Head: Normocephalic and atraumatic.     Right Ear: External ear normal.     Left Ear: External ear normal.     Nose: Nose normal.  Eyes:     Conjunctiva/sclera: Conjunctivae normal.  Cardiovascular:     Rate and Rhythm: Normal rate.  Pulmonary:     Effort: Pulmonary effort is normal.  Chest:     Comments: Chaperone present during exam.  Left breast unremarkable no lymph nodes felt in the axillary region.  Right breast no lymph nodes felt in the axillary region.  No nipple discharge, no retraction of nipple.  Some erythema of the right breast from the 6:00 to 11 o'clock position.  Patient reports cyst being at the 4 o'clock position closer to her sternum Neurological:     General: No focal deficit present.     Mental Status: She is alert and oriented to person, place, and time.  Psychiatric:        Mood and Affect: Mood normal.        Behavior: Behavior normal.        Thought Content: Thought content normal.        Judgment: Judgment normal.     No results found for any visits on 07/11/22.      Assessment & Plan:    Problem List Items Addressed This Visit       Other   Mass of lower inner quadrant of right breast - Primary    - Pea-sized mass felt on right breast in the 4 o'clock position near the sternum.  Patient also had some erythema of the breast - Have ordered diagnostic mammogram of the right breast to further investigate -Counseled patient on skin changes if they worsen or get darker in color to present back to the office.  Right now it is only slightly erythematous and more of a pink color unclear if it is related to extra moisture.  Patient will use hydrocortisone cream and see if it goes away.      Relevant Orders   MM Digital Diagnostic Unilat R    No orders of the defined types were placed in this encounter.   No follow-ups on file.  Owens Loffler, DO

## 2022-07-11 ENCOUNTER — Ambulatory Visit (INDEPENDENT_AMBULATORY_CARE_PROVIDER_SITE_OTHER): Payer: Medicare Other | Admitting: Family Medicine

## 2022-07-11 ENCOUNTER — Encounter: Payer: Self-pay | Admitting: Family Medicine

## 2022-07-11 VITALS — BP 120/79 | HR 70 | Temp 98.6°F

## 2022-07-11 DIAGNOSIS — N6314 Unspecified lump in the right breast, lower inner quadrant: Secondary | ICD-10-CM

## 2022-07-11 HISTORY — DX: Unspecified lump in the right breast, lower inner quadrant: N63.14

## 2022-07-11 NOTE — Assessment & Plan Note (Addendum)
-   Pea-sized mass felt on right breast in the 4 o'clock position near the sternum.  Patient also had some erythema of the breast - Have ordered diagnostic mammogram of the right breast to further investigate -Counseled patient on skin changes if they worsen or get darker in color to present back to the office.  Right now it is only slightly erythematous and more of a pink color unclear if it is related to extra moisture.  Patient will use hydrocortisone cream and see if it goes away.

## 2022-07-16 ENCOUNTER — Encounter: Payer: Self-pay | Admitting: General Practice

## 2022-07-17 ENCOUNTER — Other Ambulatory Visit: Payer: Self-pay | Admitting: Family Medicine

## 2022-07-17 DIAGNOSIS — N6314 Unspecified lump in the right breast, lower inner quadrant: Secondary | ICD-10-CM

## 2022-07-17 DIAGNOSIS — M9902 Segmental and somatic dysfunction of thoracic region: Secondary | ICD-10-CM | POA: Diagnosis not present

## 2022-07-17 DIAGNOSIS — M546 Pain in thoracic spine: Secondary | ICD-10-CM | POA: Diagnosis not present

## 2022-07-17 DIAGNOSIS — M9901 Segmental and somatic dysfunction of cervical region: Secondary | ICD-10-CM | POA: Diagnosis not present

## 2022-07-17 DIAGNOSIS — M4723 Other spondylosis with radiculopathy, cervicothoracic region: Secondary | ICD-10-CM | POA: Diagnosis not present

## 2022-07-24 DIAGNOSIS — M9902 Segmental and somatic dysfunction of thoracic region: Secondary | ICD-10-CM | POA: Diagnosis not present

## 2022-07-24 DIAGNOSIS — M546 Pain in thoracic spine: Secondary | ICD-10-CM | POA: Diagnosis not present

## 2022-07-24 DIAGNOSIS — M4723 Other spondylosis with radiculopathy, cervicothoracic region: Secondary | ICD-10-CM | POA: Diagnosis not present

## 2022-07-24 DIAGNOSIS — M9901 Segmental and somatic dysfunction of cervical region: Secondary | ICD-10-CM | POA: Diagnosis not present

## 2022-07-26 DIAGNOSIS — Z23 Encounter for immunization: Secondary | ICD-10-CM | POA: Diagnosis not present

## 2022-07-29 DIAGNOSIS — M4723 Other spondylosis with radiculopathy, cervicothoracic region: Secondary | ICD-10-CM | POA: Diagnosis not present

## 2022-07-29 DIAGNOSIS — M9902 Segmental and somatic dysfunction of thoracic region: Secondary | ICD-10-CM | POA: Diagnosis not present

## 2022-07-29 DIAGNOSIS — M546 Pain in thoracic spine: Secondary | ICD-10-CM | POA: Diagnosis not present

## 2022-07-29 DIAGNOSIS — M9901 Segmental and somatic dysfunction of cervical region: Secondary | ICD-10-CM | POA: Diagnosis not present

## 2022-08-05 ENCOUNTER — Other Ambulatory Visit: Payer: Self-pay | Admitting: Cardiology

## 2022-08-07 DIAGNOSIS — M9902 Segmental and somatic dysfunction of thoracic region: Secondary | ICD-10-CM | POA: Diagnosis not present

## 2022-08-07 DIAGNOSIS — M9901 Segmental and somatic dysfunction of cervical region: Secondary | ICD-10-CM | POA: Diagnosis not present

## 2022-08-07 DIAGNOSIS — M546 Pain in thoracic spine: Secondary | ICD-10-CM | POA: Diagnosis not present

## 2022-08-07 DIAGNOSIS — M4723 Other spondylosis with radiculopathy, cervicothoracic region: Secondary | ICD-10-CM | POA: Diagnosis not present

## 2022-08-12 DIAGNOSIS — M546 Pain in thoracic spine: Secondary | ICD-10-CM | POA: Diagnosis not present

## 2022-08-12 DIAGNOSIS — M9902 Segmental and somatic dysfunction of thoracic region: Secondary | ICD-10-CM | POA: Diagnosis not present

## 2022-08-12 DIAGNOSIS — M9901 Segmental and somatic dysfunction of cervical region: Secondary | ICD-10-CM | POA: Diagnosis not present

## 2022-08-12 DIAGNOSIS — M4723 Other spondylosis with radiculopathy, cervicothoracic region: Secondary | ICD-10-CM | POA: Diagnosis not present

## 2022-08-14 DIAGNOSIS — D485 Neoplasm of uncertain behavior of skin: Secondary | ICD-10-CM | POA: Diagnosis not present

## 2022-08-14 DIAGNOSIS — D225 Melanocytic nevi of trunk: Secondary | ICD-10-CM | POA: Diagnosis not present

## 2022-08-14 DIAGNOSIS — D2239 Melanocytic nevi of other parts of face: Secondary | ICD-10-CM | POA: Diagnosis not present

## 2022-08-14 DIAGNOSIS — L820 Inflamed seborrheic keratosis: Secondary | ICD-10-CM | POA: Diagnosis not present

## 2022-08-14 DIAGNOSIS — L578 Other skin changes due to chronic exposure to nonionizing radiation: Secondary | ICD-10-CM | POA: Diagnosis not present

## 2022-08-14 DIAGNOSIS — L814 Other melanin hyperpigmentation: Secondary | ICD-10-CM | POA: Diagnosis not present

## 2022-08-20 ENCOUNTER — Ambulatory Visit
Admission: RE | Admit: 2022-08-20 | Discharge: 2022-08-20 | Disposition: A | Discharge status: Home / Self Care | Payer: Medicare Other | Source: Ambulatory Visit | Attending: Family Medicine | Admitting: Family Medicine

## 2022-08-20 DIAGNOSIS — N644 Mastodynia: Secondary | ICD-10-CM | POA: Diagnosis not present

## 2022-08-20 DIAGNOSIS — N6314 Unspecified lump in the right breast, lower inner quadrant: Secondary | ICD-10-CM

## 2022-08-22 DIAGNOSIS — M546 Pain in thoracic spine: Secondary | ICD-10-CM | POA: Diagnosis not present

## 2022-08-22 DIAGNOSIS — M9902 Segmental and somatic dysfunction of thoracic region: Secondary | ICD-10-CM | POA: Diagnosis not present

## 2022-08-22 DIAGNOSIS — M9901 Segmental and somatic dysfunction of cervical region: Secondary | ICD-10-CM | POA: Diagnosis not present

## 2022-08-22 DIAGNOSIS — M4723 Other spondylosis with radiculopathy, cervicothoracic region: Secondary | ICD-10-CM | POA: Diagnosis not present

## 2022-08-28 DIAGNOSIS — Z23 Encounter for immunization: Secondary | ICD-10-CM | POA: Diagnosis not present

## 2022-09-01 DIAGNOSIS — M4723 Other spondylosis with radiculopathy, cervicothoracic region: Secondary | ICD-10-CM | POA: Diagnosis not present

## 2022-09-01 DIAGNOSIS — M546 Pain in thoracic spine: Secondary | ICD-10-CM | POA: Diagnosis not present

## 2022-09-01 DIAGNOSIS — M9901 Segmental and somatic dysfunction of cervical region: Secondary | ICD-10-CM | POA: Diagnosis not present

## 2022-09-01 DIAGNOSIS — M9902 Segmental and somatic dysfunction of thoracic region: Secondary | ICD-10-CM | POA: Diagnosis not present

## 2022-09-04 DIAGNOSIS — M546 Pain in thoracic spine: Secondary | ICD-10-CM | POA: Diagnosis not present

## 2022-09-04 DIAGNOSIS — M9901 Segmental and somatic dysfunction of cervical region: Secondary | ICD-10-CM | POA: Diagnosis not present

## 2022-09-04 DIAGNOSIS — M4723 Other spondylosis with radiculopathy, cervicothoracic region: Secondary | ICD-10-CM | POA: Diagnosis not present

## 2022-09-04 DIAGNOSIS — M9902 Segmental and somatic dysfunction of thoracic region: Secondary | ICD-10-CM | POA: Diagnosis not present

## 2022-09-09 DIAGNOSIS — M9901 Segmental and somatic dysfunction of cervical region: Secondary | ICD-10-CM | POA: Diagnosis not present

## 2022-09-09 DIAGNOSIS — M546 Pain in thoracic spine: Secondary | ICD-10-CM | POA: Diagnosis not present

## 2022-09-09 DIAGNOSIS — M4723 Other spondylosis with radiculopathy, cervicothoracic region: Secondary | ICD-10-CM | POA: Diagnosis not present

## 2022-09-09 DIAGNOSIS — M9902 Segmental and somatic dysfunction of thoracic region: Secondary | ICD-10-CM | POA: Diagnosis not present

## 2022-09-10 DIAGNOSIS — M9902 Segmental and somatic dysfunction of thoracic region: Secondary | ICD-10-CM | POA: Diagnosis not present

## 2022-09-10 DIAGNOSIS — M4723 Other spondylosis with radiculopathy, cervicothoracic region: Secondary | ICD-10-CM | POA: Diagnosis not present

## 2022-09-10 DIAGNOSIS — M546 Pain in thoracic spine: Secondary | ICD-10-CM | POA: Diagnosis not present

## 2022-09-10 DIAGNOSIS — M9901 Segmental and somatic dysfunction of cervical region: Secondary | ICD-10-CM | POA: Diagnosis not present

## 2022-09-12 DIAGNOSIS — M546 Pain in thoracic spine: Secondary | ICD-10-CM | POA: Diagnosis not present

## 2022-09-12 DIAGNOSIS — M9902 Segmental and somatic dysfunction of thoracic region: Secondary | ICD-10-CM | POA: Diagnosis not present

## 2022-09-12 DIAGNOSIS — M9901 Segmental and somatic dysfunction of cervical region: Secondary | ICD-10-CM | POA: Diagnosis not present

## 2022-09-12 DIAGNOSIS — M4723 Other spondylosis with radiculopathy, cervicothoracic region: Secondary | ICD-10-CM | POA: Diagnosis not present

## 2022-09-15 DIAGNOSIS — M9902 Segmental and somatic dysfunction of thoracic region: Secondary | ICD-10-CM | POA: Diagnosis not present

## 2022-09-15 DIAGNOSIS — M4723 Other spondylosis with radiculopathy, cervicothoracic region: Secondary | ICD-10-CM | POA: Diagnosis not present

## 2022-09-15 DIAGNOSIS — M546 Pain in thoracic spine: Secondary | ICD-10-CM | POA: Diagnosis not present

## 2022-09-15 DIAGNOSIS — M9901 Segmental and somatic dysfunction of cervical region: Secondary | ICD-10-CM | POA: Diagnosis not present

## 2022-09-17 DIAGNOSIS — M546 Pain in thoracic spine: Secondary | ICD-10-CM | POA: Diagnosis not present

## 2022-09-17 DIAGNOSIS — M9902 Segmental and somatic dysfunction of thoracic region: Secondary | ICD-10-CM | POA: Diagnosis not present

## 2022-09-17 DIAGNOSIS — M9901 Segmental and somatic dysfunction of cervical region: Secondary | ICD-10-CM | POA: Diagnosis not present

## 2022-09-17 DIAGNOSIS — M4723 Other spondylosis with radiculopathy, cervicothoracic region: Secondary | ICD-10-CM | POA: Diagnosis not present

## 2022-09-18 ENCOUNTER — Encounter: Payer: Self-pay | Admitting: Medical-Surgical

## 2022-09-19 ENCOUNTER — Other Ambulatory Visit: Payer: Self-pay | Admitting: Cardiology

## 2022-09-19 DIAGNOSIS — M4723 Other spondylosis with radiculopathy, cervicothoracic region: Secondary | ICD-10-CM | POA: Diagnosis not present

## 2022-09-19 DIAGNOSIS — I480 Paroxysmal atrial fibrillation: Secondary | ICD-10-CM

## 2022-09-19 DIAGNOSIS — M9902 Segmental and somatic dysfunction of thoracic region: Secondary | ICD-10-CM | POA: Diagnosis not present

## 2022-09-19 DIAGNOSIS — M546 Pain in thoracic spine: Secondary | ICD-10-CM | POA: Diagnosis not present

## 2022-09-19 DIAGNOSIS — M9901 Segmental and somatic dysfunction of cervical region: Secondary | ICD-10-CM | POA: Diagnosis not present

## 2022-09-19 NOTE — Telephone Encounter (Signed)
Eliquis '5mg'$  refill request received. Patient is 71 years old, weight-76.7kg, Crea-0.61 on 06/09/2022, Diagnosis-Afib, and last seen by Dr. Bettina Gavia on 03/24/2022. Dose is appropriate based on dosing criteria. Will send in refill to requested pharmacy.

## 2022-09-22 ENCOUNTER — Ambulatory Visit (INDEPENDENT_AMBULATORY_CARE_PROVIDER_SITE_OTHER): Payer: Medicare Other

## 2022-09-22 ENCOUNTER — Ambulatory Visit (INDEPENDENT_AMBULATORY_CARE_PROVIDER_SITE_OTHER): Payer: Medicare Other | Admitting: Sports Medicine

## 2022-09-22 ENCOUNTER — Encounter: Payer: Self-pay | Admitting: Sports Medicine

## 2022-09-22 DIAGNOSIS — M47817 Spondylosis without myelopathy or radiculopathy, lumbosacral region: Secondary | ICD-10-CM

## 2022-09-22 DIAGNOSIS — M4126 Other idiopathic scoliosis, lumbar region: Secondary | ICD-10-CM | POA: Diagnosis not present

## 2022-09-22 DIAGNOSIS — M545 Low back pain, unspecified: Secondary | ICD-10-CM | POA: Diagnosis not present

## 2022-09-22 DIAGNOSIS — M5136 Other intervertebral disc degeneration, lumbar region: Secondary | ICD-10-CM | POA: Diagnosis not present

## 2022-09-22 DIAGNOSIS — M533 Sacrococcygeal disorders, not elsewhere classified: Secondary | ICD-10-CM | POA: Diagnosis not present

## 2022-09-22 MED ORDER — PREDNISONE 50 MG PO TABS: ORAL_TABLET | ORAL | 0 refills | Status: DC

## 2022-09-22 NOTE — Progress Notes (Signed)
    Procedures performed today:    None.  Independent interpretation of notes and tests performed by another provider:   None.  Brief History, Exam, Impression, and Recommendations:    Lumbosacral spondylosis Very pleasant 71 year old female, chronic axial low back pain, worsened recently over the past month or so. Pain is localized at the SI joint. She has been seeing a chiropractor, moderate improvements. On exam she has tenderness at the SI joint but nothing paracentral or over the facets. She does have worsening of pain with standing. 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 Adding 5 days of prednisone, baseline x-rays and home physical therapy, return to see me in 6 weeks, SI joint injection +/- lumbar spine MRI if not better.  Chronic process with exacerbation and pharmacologic intervention  ____________________________________________ [name], M.D., ABFM., CAQSM., AME. Primary Care and Sports Medicine [facility]  Adjunct Professor of [employer] [employer]  Risk manager

## 2022-09-22 NOTE — Assessment & Plan Note (Signed)
Very pleasant 71 year old female, chronic axial low back pain, worsened recently over the past month or so. Pain is localized at the SI joint. She has been seeing a chiropractor, moderate improvements. On exam she has tenderness at the SI joint but nothing paracentral or over the facets. She does have worsening of pain with standing. 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 Adding 5 days of prednisone, baseline x-rays and home physical therapy, return to see me in 6 weeks, SI joint injection +/- lumbar spine MRI if not better.

## 2022-09-24 ENCOUNTER — Encounter: Payer: Self-pay | Admitting: Sports Medicine

## 2022-09-24 DIAGNOSIS — M9901 Segmental and somatic dysfunction of cervical region: Secondary | ICD-10-CM | POA: Diagnosis not present

## 2022-09-24 DIAGNOSIS — M4723 Other spondylosis with radiculopathy, cervicothoracic region: Secondary | ICD-10-CM | POA: Diagnosis not present

## 2022-09-24 DIAGNOSIS — M546 Pain in thoracic spine: Secondary | ICD-10-CM | POA: Diagnosis not present

## 2022-09-24 DIAGNOSIS — M9902 Segmental and somatic dysfunction of thoracic region: Secondary | ICD-10-CM | POA: Diagnosis not present

## 2022-09-26 DIAGNOSIS — M9901 Segmental and somatic dysfunction of cervical region: Secondary | ICD-10-CM | POA: Diagnosis not present

## 2022-09-26 DIAGNOSIS — M9902 Segmental and somatic dysfunction of thoracic region: Secondary | ICD-10-CM | POA: Diagnosis not present

## 2022-09-26 DIAGNOSIS — M4723 Other spondylosis with radiculopathy, cervicothoracic region: Secondary | ICD-10-CM | POA: Diagnosis not present

## 2022-09-26 DIAGNOSIS — M546 Pain in thoracic spine: Secondary | ICD-10-CM | POA: Diagnosis not present

## 2022-09-30 DIAGNOSIS — M9901 Segmental and somatic dysfunction of cervical region: Secondary | ICD-10-CM | POA: Diagnosis not present

## 2022-09-30 DIAGNOSIS — M4723 Other spondylosis with radiculopathy, cervicothoracic region: Secondary | ICD-10-CM | POA: Diagnosis not present

## 2022-09-30 DIAGNOSIS — M546 Pain in thoracic spine: Secondary | ICD-10-CM | POA: Diagnosis not present

## 2022-09-30 DIAGNOSIS — M9902 Segmental and somatic dysfunction of thoracic region: Secondary | ICD-10-CM | POA: Diagnosis not present

## 2022-10-03 DIAGNOSIS — M9901 Segmental and somatic dysfunction of cervical region: Secondary | ICD-10-CM | POA: Diagnosis not present

## 2022-10-03 DIAGNOSIS — M9902 Segmental and somatic dysfunction of thoracic region: Secondary | ICD-10-CM | POA: Diagnosis not present

## 2022-10-03 DIAGNOSIS — M4723 Other spondylosis with radiculopathy, cervicothoracic region: Secondary | ICD-10-CM | POA: Diagnosis not present

## 2022-10-03 DIAGNOSIS — M546 Pain in thoracic spine: Secondary | ICD-10-CM | POA: Diagnosis not present

## 2022-10-06 DIAGNOSIS — M4723 Other spondylosis with radiculopathy, cervicothoracic region: Secondary | ICD-10-CM | POA: Diagnosis not present

## 2022-10-06 DIAGNOSIS — M9901 Segmental and somatic dysfunction of cervical region: Secondary | ICD-10-CM | POA: Diagnosis not present

## 2022-10-06 DIAGNOSIS — M546 Pain in thoracic spine: Secondary | ICD-10-CM | POA: Diagnosis not present

## 2022-10-06 DIAGNOSIS — M9902 Segmental and somatic dysfunction of thoracic region: Secondary | ICD-10-CM | POA: Diagnosis not present

## 2022-10-09 DIAGNOSIS — M9902 Segmental and somatic dysfunction of thoracic region: Secondary | ICD-10-CM | POA: Diagnosis not present

## 2022-10-09 DIAGNOSIS — M546 Pain in thoracic spine: Secondary | ICD-10-CM | POA: Diagnosis not present

## 2022-10-09 DIAGNOSIS — M9901 Segmental and somatic dysfunction of cervical region: Secondary | ICD-10-CM | POA: Diagnosis not present

## 2022-10-09 DIAGNOSIS — M4723 Other spondylosis with radiculopathy, cervicothoracic region: Secondary | ICD-10-CM | POA: Diagnosis not present

## 2022-10-13 ENCOUNTER — Emergency Department (HOSPITAL_BASED_OUTPATIENT_CLINIC_OR_DEPARTMENT_OTHER): Payer: Medicare Other

## 2022-10-13 ENCOUNTER — Other Ambulatory Visit: Payer: Self-pay

## 2022-10-13 ENCOUNTER — Encounter (HOSPITAL_COMMUNITY): Payer: Self-pay

## 2022-10-13 ENCOUNTER — Encounter (HOSPITAL_COMMUNITY): Payer: Self-pay | Admitting: Family Medicine

## 2022-10-13 ENCOUNTER — Observation Stay (HOSPITAL_BASED_OUTPATIENT_CLINIC_OR_DEPARTMENT_OTHER)
Admission: EM | Admit: 2022-10-13 | Discharge: 2022-10-14 | Disposition: A | Discharge status: Home / Self Care | Payer: Medicare Other | Attending: Internal Medicine | Admitting: Internal Medicine

## 2022-10-13 DIAGNOSIS — R059 Cough, unspecified: Secondary | ICD-10-CM | POA: Diagnosis not present

## 2022-10-13 DIAGNOSIS — Z96653 Presence of artificial knee joint, bilateral: Secondary | ICD-10-CM | POA: Insufficient documentation

## 2022-10-13 DIAGNOSIS — Z7901 Long term (current) use of anticoagulants: Secondary | ICD-10-CM | POA: Insufficient documentation

## 2022-10-13 DIAGNOSIS — R0789 Other chest pain: Secondary | ICD-10-CM | POA: Diagnosis not present

## 2022-10-13 DIAGNOSIS — I10 Essential (primary) hypertension: Secondary | ICD-10-CM | POA: Diagnosis not present

## 2022-10-13 DIAGNOSIS — D259 Leiomyoma of uterus, unspecified: Secondary | ICD-10-CM | POA: Diagnosis not present

## 2022-10-13 DIAGNOSIS — M5030 Other cervical disc degeneration, unspecified cervical region: Secondary | ICD-10-CM | POA: Diagnosis present

## 2022-10-13 DIAGNOSIS — I499 Cardiac arrhythmia, unspecified: Secondary | ICD-10-CM | POA: Diagnosis present

## 2022-10-13 DIAGNOSIS — R0602 Shortness of breath: Secondary | ICD-10-CM | POA: Diagnosis not present

## 2022-10-13 DIAGNOSIS — Z91040 Latex allergy status: Secondary | ICD-10-CM | POA: Diagnosis not present

## 2022-10-13 DIAGNOSIS — F410 Panic disorder [episodic paroxysmal anxiety] without agoraphobia: Secondary | ICD-10-CM | POA: Diagnosis present

## 2022-10-13 DIAGNOSIS — E785 Hyperlipidemia, unspecified: Secondary | ICD-10-CM | POA: Diagnosis present

## 2022-10-13 DIAGNOSIS — I480 Paroxysmal atrial fibrillation: Secondary | ICD-10-CM | POA: Diagnosis not present

## 2022-10-13 DIAGNOSIS — M9902 Segmental and somatic dysfunction of thoracic region: Secondary | ICD-10-CM | POA: Diagnosis not present

## 2022-10-13 DIAGNOSIS — K5730 Diverticulosis of large intestine without perforation or abscess without bleeding: Secondary | ICD-10-CM | POA: Diagnosis not present

## 2022-10-13 DIAGNOSIS — M546 Pain in thoracic spine: Secondary | ICD-10-CM | POA: Diagnosis not present

## 2022-10-13 DIAGNOSIS — R079 Chest pain, unspecified: Secondary | ICD-10-CM | POA: Diagnosis present

## 2022-10-13 DIAGNOSIS — Z79899 Other long term (current) drug therapy: Secondary | ICD-10-CM | POA: Diagnosis not present

## 2022-10-13 DIAGNOSIS — M4723 Other spondylosis with radiculopathy, cervicothoracic region: Secondary | ICD-10-CM | POA: Diagnosis not present

## 2022-10-13 DIAGNOSIS — E782 Mixed hyperlipidemia: Secondary | ICD-10-CM | POA: Diagnosis present

## 2022-10-13 DIAGNOSIS — R110 Nausea: Secondary | ICD-10-CM | POA: Diagnosis not present

## 2022-10-13 DIAGNOSIS — Z87891 Personal history of nicotine dependence: Secondary | ICD-10-CM | POA: Diagnosis not present

## 2022-10-13 DIAGNOSIS — K219 Gastro-esophageal reflux disease without esophagitis: Secondary | ICD-10-CM | POA: Diagnosis present

## 2022-10-13 DIAGNOSIS — Z9889 Other specified postprocedural states: Secondary | ICD-10-CM | POA: Diagnosis not present

## 2022-10-13 DIAGNOSIS — M9901 Segmental and somatic dysfunction of cervical region: Secondary | ICD-10-CM | POA: Diagnosis not present

## 2022-10-13 DIAGNOSIS — G8929 Other chronic pain: Secondary | ICD-10-CM | POA: Diagnosis present

## 2022-10-13 LAB — BASIC METABOLIC PANEL
Anion gap: 9 (ref 5–15)
BUN: 20 mg/dL (ref 8–23)
CO2: 24 mmol/L (ref 22–32)
Calcium: 9.5 mg/dL (ref 8.9–10.3)
Chloride: 105 mmol/L (ref 98–111)
Creatinine, Ser: 0.63 mg/dL (ref 0.44–1.00)
GFR, Estimated: >60 mL/min (ref >60)
Glucose, Bld: 94 mg/dL (ref 70–99)
Potassium: 3.7 mmol/L (ref 3.5–5.1)
Sodium: 138 mmol/L (ref 135–145)

## 2022-10-13 LAB — CBC
HCT: 43.1 % (ref 36.0–46.0)
Hemoglobin: 14 g/dL (ref 12.0–15.0)
MCH: 29 pg (ref 26.0–34.0)
MCHC: 32.5 g/dL (ref 30.0–36.0)
MCV: 89.2 fL (ref 80.0–100.0)
Platelets: 281 10*3/uL (ref 150–400)
RBC: 4.83 MIL/uL (ref 3.87–5.11)
RDW: 13.5 % (ref 11.5–15.5)
WBC: 7.6 10*3/uL (ref 4.0–10.5)
nRBC: 0 % (ref 0.0–0.2)

## 2022-10-13 LAB — TROPONIN I (HIGH SENSITIVITY)
Troponin I (High Sensitivity): 3 ng/L (ref <18)
Troponin I (High Sensitivity): 2 ng/L (ref <18)

## 2022-10-13 LAB — D-DIMER, QUANTITATIVE: D-Dimer, Quant: 1.32 ug/mL-FEU — ABNORMAL HIGH (ref 0.00–0.50)

## 2022-10-13 LAB — HEPATIC FUNCTION PANEL
ALT: 16 U/L (ref 0–44)
AST: 22 U/L (ref 15–41)
Albumin: 3.8 g/dL (ref 3.5–5.0)
Alkaline Phosphatase: 65 U/L (ref 38–126)
Bilirubin, Direct: <0.1 mg/dL (ref 0.0–0.2)
Total Bilirubin: 0.5 mg/dL (ref 0.3–1.2)
Total Protein: 7.3 g/dL (ref 6.5–8.1)

## 2022-10-13 LAB — LIPASE, BLOOD: Lipase: 46 U/L (ref 11–51)

## 2022-10-13 MED ORDER — METOPROLOL SUCCINATE ER 25 MG PO TB24
12.5000 mg | ORAL_TABLET | Freq: Every day | ORAL | Status: DC
  Administered 2022-10-14: 12.5 mg via ORAL
  Filled 2022-10-13: qty 1

## 2022-10-13 MED ORDER — SODIUM CHLORIDE 0.45 % IV SOLN: INTRAVENOUS | Status: DC

## 2022-10-13 MED ORDER — GABAPENTIN 100 MG PO CAPS
200.0000 mg | ORAL_CAPSULE | Freq: Three times a day (TID) | ORAL | Status: DC
  Administered 2022-10-13 – 2022-10-14 (×2): 200 mg via ORAL
  Filled 2022-10-13 (×2): qty 2

## 2022-10-13 MED ORDER — LORATADINE 10 MG PO TABS: 10.0000 mg | ORAL_TABLET | Freq: Every day | ORAL | Status: DC

## 2022-10-13 MED ORDER — ONDANSETRON HCL 4 MG/2ML IJ SOLN: 4.0000 mg | Freq: Four times a day (QID) | INTRAMUSCULAR | Status: DC | PRN

## 2022-10-13 MED ORDER — FLECAINIDE ACETATE 50 MG PO TABS
50.0000 mg | ORAL_TABLET | Freq: Two times a day (BID) | ORAL | Status: DC
  Administered 2022-10-13 – 2022-10-14 (×2): 50 mg via ORAL
  Filled 2022-10-13 (×3): qty 1

## 2022-10-13 MED ORDER — ACETAMINOPHEN 325 MG PO TABS
650.0000 mg | ORAL_TABLET | ORAL | Status: DC | PRN
  Administered 2022-10-14: 650 mg via ORAL
  Filled 2022-10-13: qty 2

## 2022-10-13 MED ORDER — FLUOXETINE HCL 20 MG PO CAPS
20.0000 mg | ORAL_CAPSULE | Freq: Every day | ORAL | Status: DC
  Administered 2022-10-14: 20 mg via ORAL
  Filled 2022-10-13: qty 1

## 2022-10-13 MED ORDER — ASPIRIN 81 MG PO CHEW
324.0000 mg | CHEWABLE_TABLET | Freq: Once | ORAL | Status: AC
  Administered 2022-10-13: 324 mg via ORAL
  Filled 2022-10-13: qty 4

## 2022-10-13 MED ORDER — APIXABAN 5 MG PO TABS
5.0000 mg | ORAL_TABLET | Freq: Two times a day (BID) | ORAL | Status: DC
  Administered 2022-10-13 – 2022-10-14 (×2): 5 mg via ORAL
  Filled 2022-10-13 (×2): qty 1

## 2022-10-13 MED ORDER — DICYCLOMINE HCL 10 MG PO CAPS: 10.0000 mg | ORAL_CAPSULE | Freq: Four times a day (QID) | ORAL | Status: DC | PRN

## 2022-10-13 MED ORDER — IOHEXOL 350 MG/ML SOLN
100.0000 mL | Freq: Once | INTRAVENOUS | Status: AC | PRN
  Administered 2022-10-13: 100 mL via INTRAVENOUS

## 2022-10-13 NOTE — Progress Notes (Signed)
Plan of Care Note for accepted transfer   Patient: [name] MRN: [id number]   Sp[place]ter: 10/13/2022  Facility requesting transfer: [facility]  Requesting Provider: Dr. Wyvonnia Dusky   Reason for transfer: Chest pain   Facility course: 71 yr old lady with PAF on Eliquis presents with 2 hours of chest pain radiating to jaw and left arm with associated nausea. No acute ischemic features noted on EKG, troponin is normal x2, and no acute CXR findings. D-dimer is elevated to 1.32. CTA chest/abd/pelvis negative. She was given 324 mg ASA and is pain-free at this time.   Plan of care: The patient is accepted for admission to Telemetry unit, at [facility].   Author: Vianne Bulls, MD 10/13/2022  Check [email or web address] for on-call coverage.  Nursing staff, Please call [place] number on Amion as soon as patient's arrival, so appropriate admitting provider can evaluate the pt.

## 2022-10-13 NOTE — ED Notes (Signed)
D Dimer and 2nd troponin sent to lab

## 2022-10-13 NOTE — ED Notes (Signed)
EDP in to see the Pt. At this time

## 2022-10-13 NOTE — ED Provider Notes (Signed)
[facility] EMERGENCY DEPARTMENT Provider Note   CSN: [id number] Arrival date & time: 10/13/22  1603     History  Chief Complaint  Patient presents with   Chest Pain    Melissa Rodgers is a 71 y.o. female.  Patient with a history of atrial fibrillation on Eliquis, diverticulitis, hypertension, hyperlipidemia, chronic back pain presenting with left-sided chest pain that onset today around 2 PM.  She reports soreness to her left chest and shoulder area that waxes and wanes in severity but feels like a constant pressure.  It radiates to her arm and her neck.  She has chronic low back pain which is unchanged today.  Some associated shortness of breath, nausea but no diaphoresis.  Does feel pain with deep breathing.  She is never had this kind of pain in the past.  Denies any cardiac history.  States last stress test was in 2017.  No known stents.  The pain is worse with breathing and better with rest.  Denies any abdominal pain.  Denies any change in her chronic mid or low back pain.  The history is provided by the patient.  Chest Pain Associated symptoms: cough and shortness of breath   Associated symptoms: no abdominal pain, no fever, no headache, no nausea, no vomiting and no weakness        Home Medications Prior to Admission medications   Medication Sig Start Date End Date Taking? Authorizing Provider  cetirizine (ZYRTEC) 10 MG tablet Take 10 mg by mouth daily.    [provider]  Cyanocobalamin (B-12 PO) Take 1 tablet by mouth daily.    [provider]  dicyclomine (BENTYL) 10 MG capsule Take 1 capsule (10 mg total) by mouth every 6 (six) hours as needed for spasms. 05/22/22   Cirigliano, Vito V, DO  ELIQUIS 5 MG TABS tablet TAKE 1 TABLET TWICE A DAY 09/19/22   Richardo Priest, MD  flecainide (TAMBOCOR) 50 MG tablet TAKE 1 TABLET(50 MG) BY MOUTH TWICE DAILY 08/05/22   Richardo Priest, MD  FLUoxetine (PROZAC) 20 MG tablet TAKE 1 TABLET(20 MG) BY MOUTH  DAILY 05/08/22   Samuel Bouche, NP  gabapentin (NEURONTIN) 100 MG capsule TAKE 2 CAPSULES(200 MG) BY MOUTH THREE TIMES DAILY 06/16/22   Samuel Bouche, NP  metoprolol succinate (TOPROL XL) 25 MG 24 hr tablet Take 0.5 tablets (12.5 mg total) by mouth daily. 03/24/22   Richardo Priest, MD  predniSONE (DELTASONE) 50 MG tablet One tab PO daily for 5 days. 09/22/22   Silverio Decamp, MD      Allergies    Rosuvastatin and Sulfa antibiotics    Review of Systems   Review of Systems  Constitutional:  Negative for activity change, appetite change and fever.  HENT:  Negative for congestion.   Respiratory:  Positive for cough and shortness of breath. Negative for chest tightness.   Cardiovascular:  Positive for chest pain.  Gastrointestinal:  Negative for abdominal pain, nausea and vomiting.  Genitourinary:  Negative for dysuria and hematuria.  Musculoskeletal:  Negative for arthralgias and myalgias.  Skin:  Negative for rash.  Neurological:  Negative for facial asymmetry, weakness and headaches.   all other systems are negative except as noted in the HPI and PMH.    Physical Exam Updated Vital Signs BP (!) 154/80   Pulse 63   Temp 98 F (36.7 C)   Resp 17   Ht '5\' 3"'$  (1.6 m)   Wt 77.1 kg   SpO2  96%   BMI 30.11 kg/m  Physical Exam Vitals and nursing note reviewed.  Constitutional:      General: She is not in acute distress.    Appearance: She is well-developed.  HENT:     Head: Normocephalic and atraumatic.     Mouth/Throat:     Pharynx: No oropharyngeal exudate.  Eyes:     Conjunctiva/sclera: Conjunctivae normal.     Pupils: Pupils are equal, round, and reactive to light.  Neck:     Comments: No meningismus. Cardiovascular:     Rate and Rhythm: Normal rate and regular rhythm.     Heart sounds: Normal heart sounds. No murmur heard. Pulmonary:     Effort: Pulmonary effort is normal. No respiratory distress.     Breath sounds: Normal breath sounds.  Abdominal:     Palpations:  Abdomen is soft.     Tenderness: There is no abdominal tenderness. There is no guarding or rebound.  Musculoskeletal:        General: No tenderness. Normal range of motion.     Cervical back: Normal range of motion and neck supple.     Comments: Pain not worse with left arm movement  Left paraspinal lumbar tenderness, no midline tenderness  Skin:    General: Skin is warm.  Neurological:     Mental Status: She is alert and oriented to person, place, and time.     Cranial Nerves: No cranial nerve deficit.     Motor: No abnormal muscle tone.     Coordination: Coordination normal.     Comments:  5/5 strength throughout. CN 2-12 intact.Equal grip strength.   Psychiatric:        Behavior: Behavior normal.     ED Results / Procedures / Treatments   Labs (all labs ordered are listed, but only abnormal results are displayed) Labs Reviewed  D-DIMER, QUANTITATIVE (NOT AT [facility]) - Abnormal; Notable for the following components:      Result Value   D-Dimer, Quant 1.32 (*)    All other components within normal limits  BASIC METABOLIC PANEL  CBC  LIPASE, BLOOD  HEPATIC FUNCTION PANEL  TROPONIN I (HIGH SENSITIVITY)  TROPONIN I (HIGH SENSITIVITY)    EKG EKG Interpretation  Date/Time:  Monday October 13 2022 16:12:35 EST Ventricular Rate:  65 PR Interval:  178 QRS Duration: 84 QT Interval:  434 QTC Calculation: 451 R Axis:   1 Text Interpretation: Normal sinus rhythm Low voltage QRS Cannot rule out Anterior infarct , age undetermined Abnormal ECG No previous ECGs available No previous ECGs available Confirmed by Ezequiel Essex [phone]) on 10/13/2022 4:22:22 PM  Radiology CT Angio Chest/Abd/Pel for Dissection W and/or Wo Contrast  Result Date: 10/13/2022 CLINICAL DATA:  Acute aortic syndrome suspected. EXAM: CT ANGIOGRAPHY CHEST, ABDOMEN AND PELVIS TECHNIQUE: Non-contrast CT of the chest was initially obtained. Multidetector CT imaging through the chest, abdomen and pelvis was  performed using the standard protocol during bolus administration of intravenous contrast. Multiplanar reconstructed images and MIPs were obtained and reviewed to evaluate the vascular anatomy. RADIATION DOSE REDUCTION: This exam was performed according to the departmental dose-optimization program which includes automated exposure control, adjustment of the mA and/or kV according to patient size and/or use of iterative reconstruction technique. CONTRAST:  181m OMNIPAQUE IOHEXOL 350 MG/ML SOLN COMPARISON:  Chest radiograph dated 10/13/2022. FINDINGS: CTA CHEST FINDINGS Cardiovascular: There is no cardiomegaly or pericardial effusion. The thoracic aorta is unremarkable. The origins of the great vessels of the aortic arch are patent.  No pulmonary artery embolus identified. Mediastinum/Nodes: There is no hilar or mediastinal adenopathy. The esophagus is grossly unremarkable. No mediastinal fluid collection. Lungs/Pleura: The lungs are clear. There is no pleural effusion or pneumothorax. The central airways are patent. Musculoskeletal: No acute osseous pathology. Review of the MIP images confirms the above findings. CTA ABDOMEN AND PELVIS FINDINGS VASCULAR Aorta: Normal caliber aorta without aneurysm, dissection, vasculitis or significant stenosis. Celiac: Patent without evidence of aneurysm, dissection, vasculitis or significant stenosis. SMA: Patent without evidence of aneurysm, dissection, vasculitis or significant stenosis. Renals: Both renal arteries are patent without evidence of aneurysm, dissection, vasculitis, fibromuscular dysplasia or significant stenosis. IMA: Patent without evidence of aneurysm, dissection, vasculitis or significant stenosis. Inflow: Patent without evidence of aneurysm, dissection, vasculitis or significant stenosis. Veins: No obvious venous abnormality within the limitations of this arterial phase study. Review of the MIP images confirms the above findings. NON-VASCULAR No intra-abdominal  free air or free fluid. Hepatobiliary: Probable mild fatty liver. No biliary dilatation. The gallbladder is unremarkable. Pancreas: Unremarkable. No pancreatic ductal dilatation or surrounding inflammatory changes. Spleen: Normal in size without focal abnormality. Adrenals/Urinary Tract: The adrenal glands unremarkable. Subcentimeter left renal upper pole hypodense focus is too small to characterize. This can be better evaluated with ultrasound on a nonemergent/outpatient basis if clinically indicated. The kidneys, visualized ureters, and urinary bladder otherwise appear unremarkable. Stomach/Bowel: Postsurgical changes of Nissen fundoplication. There is no bowel obstruction or active inflammation. Several small scattered sigmoid diverticula without active inflammatory changes. The appendix is normal. Lymphatic: No adenopathy. Reproductive: The uterus is retroverted. Small uterine fibroids noted. No adnexal masses. Other: None Musculoskeletal: Degenerative changes of the spine. No acute osseous pathology. Review of the MIP images confirms the above findings. IMPRESSION: 1. No acute intrathoracic, abdominal, or pelvic pathology. No aortic aneurysm or dissection. 2. Mild sigmoid diverticulosis. No bowel obstruction. Normal appendix. Electronically Signed   By: Anner Crete M.D.   On: 10/13/2022 19:58   DG Chest 2 View  Result Date: 10/13/2022 CLINICAL DATA:  Chest pain EXAM: CHEST - 2 VIEW COMPARISON:  09/26/2021 FINDINGS: Transverse diameter of heart is increased. There are no signs of pulmonary edema or focal pulmonary consolidation. There is no pleural effusion or pneumothorax. IMPRESSION: No active cardiopulmonary disease. Electronically Signed   By: Elmer Picker M.D.   On: 10/13/2022 16:35    Procedures Procedures    Medications Ordered in ED Medications  aspirin chewable tablet 324 mg (324 mg Oral Given 10/13/22 1644)    ED Course/ Medical Decision Making/ A&P                            Medical Decision Making Amount and/or Complexity of Data Reviewed Labs: ordered. Decision-making details documented in ED Course. Radiology: ordered and independent interpretation performed. Decision-making details documented in ED Course. ECG/medicine tests: ordered and independent interpretation performed. Decision-making details documented in ED Course.  Risk OTC drugs. Prescription drug management. Decision regarding hospitalization.  Chest pain since 2 PM involving left chest, shoulder and neck.  EKG shows no acute ischemia.  Will give aspirin.  Check labs, chest x-ray.  Patient chest pain-free after aspirin.  Did not receive nitroglycerin.  Troponin negative.  Heart score is 4.  Last stress test was in 2017.  Her history is concerning for chest pain associated with nausea, dizziness and shortness of breath that radiates to her left arm.  D-dimer is elevated but she is compliant with her Eliquis.  CTA was obtained given her complaint of chest pain as well as back pain and concern for aortic pathology though she does have chronic back pain.  CTA shows no evidence of aneurysm of her aorta or dissection or pulmonary embolism.  Denies any missed doses of Eliquis.  Remains chest pain-free.  Given her intermediate heart score risk, we will plan overnight observation for possible ischemic evaluation in the morning.  Discussed with Dr. Myna Hidalgo.       Final Clinical Impression(s) / ED Diagnoses Final diagnoses:  Nonspecific chest pain    Rx / DC Orders ED Discharge Orders     None         Brynden Thune, Annie Main, MD 10/13/22 2028

## 2022-10-13 NOTE — H&P (Signed)
History and Physical    Patient: Melissa Rodgers XBM:[id number] DOB: October 25, 1951 DOA: 10/13/2022 DOS: the patient was seen and examined on 10/13/2022 PCP: Samuel Bouche, NP  Patient coming from: Home  Chief Complaint:  Chief Complaint  Patient presents with   Chest Pain   HPI: Melissa Rodgers is a 71 y.o. female with medical history significant of Paroxysmal atrial fibrillation, on Eliquis therapy, essential hypertension, hyperlipidemia, diverticulitis, chronic back pain, degenerative disc disease, fibromyalgia among other things who presented to the ER with left-sided chest pain radiating to the left shoulder area.  Symptoms started earlier in the day today.  There was soreness and then got worse.  Rated as 6 out of 10.  It was more constant pressure.  It was also radiating to the neck.  She did have associated shortness of breath and nausea but no diaphoresis.  Patient has chronic low back pain but this is not directly related she thinks.  This is new pain from her.  She was worked up in the past with a last cardiac stress test in 2017.  Patient has never had any cardiac stents.  No diagnosis of coronary artery disease.  Due to risk factors including hypertension hyperlipidemia patient is being admitted for MI rule out.  She has had normal EKG and normal troponin.  Review of Systems: As mentioned in the history of present illness. All other systems reviewed and are negative. Past Medical History:  Diagnosis Date   Allergy    seasonal   Anemia    Atrial fibrillation 06/18/2019   BMI 30.0-30.9,adult 01/27/2013   Candidosis of skin 04/17/2016   Formatting of this note might be different from the original. 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 01/29/15:  24 Hour Holter Monitor CONCLUSIONS: 1. Overall normal 24-hour Holter monitor recording, with rare, brief,  asymptomatic nonsustained supraventricular tachycardia as described.  2. No cardiac rhythm explanation for patient's symptoms.       Primary osteoarthritis of both knees 10/07/2016   Formatting of this note might be different from the original. 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   Primary osteoarthritis of right knee 11/21/2014   S/P laparoscopic fundoplication 02/72/5366   Scar condition and fibrosis of skin 04/17/2016   Formatting of this note might be different from the original. lower abdomen   Sebaceous hyperplasia 04/17/2016   Seborrheic keratoses 04/17/2016   Skin tags, multiple acquired 04/17/2016   Solar lentigo 04/17/2016   Subarachnoid hemorrhage following injury [facility] 2010   Fall from horse; brief hospitalization  Past Surgical History:  Procedure Laterality Date   COLONOSCOPY  10/04/2019   Dr. Esaw Dace, [facility]. polyp 2 mm in the sigmoid colon, ( Polypectomy). Moderate diverticulosis of the descending colon and sigmoid colon. Lipoma in the ascending colon. Internal hemorrhoids.   ESOPHAGOGASTRODUODENOSCOPY      possibly last one was 2017. In [place]   HERNIA REPAIR     MEDIAL PARTIAL KNEE REPLACEMENT Right 34/28/7681   NISSEN FUNDOPLICATION  15/72/6203   OOPHORECTOMY     PARTIAL KNEE ARTHROPLASTY Left 04/05/2014   TOTAL KNEE REVISION Left 05/10/2021   Procedure: TOTAL KNEE REVISION;  Surgeon: Dorna Leitz, MD;  Location: [facility];  Service: Orthopedics;  Laterality: Left;   Social History:  reports that she quit smoking about 39 years ago. Her smoking use included cigarettes. She has been exposed to tobacco smoke. She has never used smokeless tobacco. She reports current alcohol use of about 7.0 - 14.0 standard drinks of alcohol per week. She reports that she does not use drugs.  Allergies  Allergen Reactions   Rosuvastatin Other (See Comments)    Musculoskeletal Aches Muscle spasms   Sulfa Antibiotics Rash    Family History  Problem Relation Age of Onset   Diabetes Mother    Heart disease Mother    Cancer Father        small places of skin cancer to face, unsure of type   Alzheimer's disease Father        Symptom onset in late 10s   Atrial fibrillation Sister    Tremor Other        Significant maternal history of tremor; unknown regarding Parkinson's disease diagnoses   Colon cancer Neg Hx    Esophageal cancer Neg Hx    Stomach cancer Neg Hx    Rectal cancer Neg Hx     Prior to Admission medications   Medication Sig Start Date End Date Taking? Authorizing Provider  cetirizine (ZYRTEC) 10 MG tablet Take 10 mg by mouth daily.    [provider]  Cyanocobalamin (B-12 PO) Take 1 tablet by mouth daily.    [provider]  dicyclomine (BENTYL) 10 MG capsule Take 1 capsule (10 mg total) by mouth every 6 (six) hours as needed for spasms. 05/22/22   Cirigliano, Vito V, DO  ELIQUIS 5 MG TABS tablet TAKE 1 TABLET TWICE A DAY 09/19/22   Richardo Priest, MD  flecainide (TAMBOCOR) 50 MG tablet TAKE 1 TABLET(50 MG) BY MOUTH TWICE DAILY 08/05/22   Richardo Priest, MD  FLUoxetine  (PROZAC) 20 MG tablet TAKE 1 TABLET(20 MG) BY MOUTH DAILY 05/08/22   Samuel Bouche, NP  gabapentin (NEURONTIN) 100 MG capsule TAKE 2 CAPSULES(200 MG) BY MOUTH THREE TIMES DAILY 06/16/22   Samuel Bouche, NP  metoprolol succinate (TOPROL XL) 25 MG 24 hr tablet Take 0.5 tablets (12.5 mg total) by mouth daily. 03/24/22   Richardo Priest, MD  predniSONE (DELTASONE) 50 MG tablet One tab PO daily for 5 days. 09/22/22   Silverio Decamp, MD    Physical Exam: Vitals:   10/13/22 1830 10/13/22 1900 10/13/22 2101 10/13/22 2242  BP: (!) 145/83 (!) 162/82  (!) 144/97  Pulse: 65 62  63  Resp: 15 (!) 21  16  Temp:   98.2 F (36.8 C) 98.2 F (36.8 C)  TempSrc:   Oral Oral  SpO2: 97% 99%  97%  Weight:    77.8 kg  Height:    '5\' 3"'$  (1.6 m)  Constitutional: Acutely ill looking, NAD, calm, comfortable Eyes: PERRL, lids and conjunctivae normal ENMT: Mucous membranes are moist. Posterior pharynx clear of any exudate or lesions.Normal dentition.  Neck: normal, supple, no masses, no thyromegaly Respiratory: clear to auscultation bilaterally, no wheezing, no crackles. Normal respiratory effort. No accessory muscle use.  Cardiovascular: Regular rate and rhythm, no murmurs / rubs / gallops. No extremity edema. 2+ pedal pulses. No carotid bruits.  Abdomen: no tenderness, no masses palpated. No hepatosplenomegaly. Bowel sounds positive.  Musculoskeletal: Good range of motion, no joint swelling or tenderness, Skin: no rashes, lesions, ulcers. No induration Neurologic: CN 2-12 grossly intact. Sensation intact, DTR normal. Strength 5/5 in all 4.  Psychiatric: Normal judgment and insight. Alert and oriented x 3.  Anxious mood  Data Reviewed:  Temperature 98.2 blood pressure 162/82, pulse 70 respirate 21 95% on room air.  CBC and chemistry entirely within normal.  Troponin initially 3 and then 2.  LFTs within normal.  D-dimer 1.32.  CT angiogram of the chest showed no PE.  Mild sigmoid diverticulosis otherwise.  EKG  showed normal sinus rhythm with no significant changes.    Assessment and Plan:  #1 nonspecific chest pain: Patient is being admitted for observation.  Goal is to rule out MI.  She has had no known cardiac disease.  Enzymes appear to be stable at this point.  We will get echocardiogram.  Patient may be a candidate for nuclear medicine cardiac stress testing based on her risk factors.  #2 paroxysmal atrial fibrillation: She is on Eliquis.  Rate is controlled.  Appears to be in sinus rhythm now.  #3 essential hypertension: Continue blood pressure medications.  #4 hyperlipidemia: Patient is intolerant to statins.  May need to check  lipid panel again.  Patient may require Zetia or other agents.  #5 fibromyalgia: Continue gabapentin and other chronic medical treatments  #6 GERD: Confirm on resume home regimen.    Advance Care Planning:   Code Status: Full Code full code  Consults: None  Family Communication: No family at bedside  Severity of Illness: The appropriate patient status for this patient is OBSERVATION. Observation status is judged to be reasonable and necessary in order to provide the required intensity of service to ensure the patient's safety. The patient's presenting symptoms, physical exam findings, and initial radiographic and laboratory data in the context of their medical condition is felt to place them at decreased risk for further clinical deterioration. Furthermore, it is anticipated that the patient will be medically stable for discharge from the hospital within 2 midnights of admission.   AuthorBarbette Merino, MD 10/13/2022 10:46 PM  For on call review [email or web address].

## 2022-10-13 NOTE — ED Triage Notes (Signed)
Patient presents to ED via POV from home. Here with chest pain that began 2 hours PTA. Endorses pain radiation into jaw and down left arm. Also endorses nausea.

## 2022-10-14 ENCOUNTER — Observation Stay (HOSPITAL_BASED_OUTPATIENT_CLINIC_OR_DEPARTMENT_OTHER): Payer: Medicare Other

## 2022-10-14 DIAGNOSIS — R0789 Other chest pain: Secondary | ICD-10-CM | POA: Diagnosis not present

## 2022-10-14 DIAGNOSIS — R079 Chest pain, unspecified: Secondary | ICD-10-CM

## 2022-10-14 LAB — ECHOCARDIOGRAM COMPLETE
Area-P 1/2: 2.75 cm2
Calc EF: 73.6 %
Height: 63 in
P 1/2 time: 694 msec
S' Lateral: 2.9 cm
Single Plane A2C EF: 79.1 %
Single Plane A4C EF: 64.5 %
Weight: 2744.29 oz

## 2022-10-14 MED ORDER — PERFLUTREN LIPID MICROSPHERE
1.0000 mL | INTRAVENOUS | Status: AC | PRN
  Administered 2022-10-14: 1.5 mL via INTRAVENOUS

## 2022-10-14 NOTE — Discharge Summary (Signed)
Physician Discharge Summary  Melissa Rodgers RUE:[id number] DOB: 1950/11/27 DOA: 10/13/2022  PCP: Samuel Bouche, NP  Admit date: 10/13/2022 Discharge date: 10/14/2022  Admitted From: Home Disposition:  Home  Discharge Condition:Stable CODE STATUS:FULL Diet recommendation: Heart Healthy  Brief/Interim Summary:   Melissa Rodgers is a 71 y.o. female with medical history significant of Paroxysmal atrial fibrillation, on Eliquis therapy, essential hypertension, hyperlipidemia, diverticulitis, chronic back pain, degenerative disc disease, fibromyalgia among other things who presented to the ER with left-sided chest pain radiating to the left shoulder area.  Symptoms started earlier in on the day of admission.  There was soreness and then got worse.  Rated as 6 out of 10.  It was more constant pressure.  It was also radiating to the neck.  Patient's pain improved after she was admitted. Extensive work-up done.  Troponins have been negative twice.  EKG denies any significant changes.  Echocardiogram done today did not show any wall motion abnormality, EF of 65 to 14%, grade 1 diastolic dysfunction.  Findings same as previous echo.  Her chest pain could be musculoskeletal or could be associated with GERD.  She does not want to take any NSAIDs or narcotics for her pain.  She states she has not lidocaine patches at home.  Patient doesn't want to take PPI.  We have reached out to cardiology team to set up an appointment and discuss about possible nuclear stress test.  She is medically stable for discharge home today.  Following problems were addressed during her hospitalization:  Atypical chest pain: Discussion as above.  Paroxysmal A-fib: Currently in normal sinus rhythm.  On flecainide, Eliquis, metoprolol.  Hypertension: Continue current blood pressure medications.  Normotensive  Hyperlipidemia: Intolerant to statin.  Fibromyalgia: Continue gabapentin and other home medications   Discharge  Diagnoses:  Principal Problem:   Chest pain Active Problems:   Hyperlipidemia   Essential hypertension   Chronic bilateral upper abdominal pain   Gastroesophageal reflux disease without esophagitis   Generalized anxiety disorder with panic attacks   DDD (degenerative disc disease), cervical   Atrial fibrillation    Discharge Instructions  Discharge Instructions     Diet - low sodium heart healthy   Complete by: As directed    Discharge instructions   Complete by: As directed    1)Continue your home medications 2)Follow up with your PCP in a week 3)You will be called by cardiology for follow up appointment   Increase activity slowly   Complete by: As directed       Allergies as of 10/14/2022       Reactions   Rosuvastatin Other (See Comments)   Musculoskeletal Aches Muscle spasms   Sulfa Antibiotics Rash   Childhood reaction    Latex Rash        Medication List     STOP taking these medications    predniSONE 50 MG tablet Commonly known as: DELTASONE       TAKE these medications    amoxicillin 500 MG tablet Commonly known as: AMOXIL Take 2,000 mg by mouth See admin instructions. Take 2000 mg by mouth 1 hour before dental appointment   B-12 PO Take 1 tablet by mouth 2 (two) times a week.   bismuth subsalicylate 782 NF/62ZH suspension Commonly known as: PEPTO BISMOL Take 15 mLs by mouth as needed for indigestion.   calcium carbonate 500 MG chewable tablet Commonly known as: TUMS - dosed in mg elemental calcium Chew 1,000 mg by mouth as needed for indigestion or heartburn.  cetirizine 10 MG tablet Commonly known as: ZYRTEC Take 10 mg by mouth at bedtime.   clonazePAM 0.5 MG tablet Commonly known as: KLONOPIN Take 0.25 mg by mouth daily as needed for anxiety.   dicyclomine 10 MG capsule Commonly known as: BENTYL Take 1 capsule (10 mg total) by mouth every 6 (six) hours as needed for spasms.   DIGESTIVE ENZYMES PO Take 1 capsule by mouth  in the morning and at bedtime.   docusate sodium 100 MG capsule Commonly known as: COLACE Take 100 mg by mouth daily.   Eliquis 5 MG Tabs tablet Generic drug: apixaban TAKE 1 TABLET TWICE A DAY What changed: how much to take   flecainide 50 MG tablet Commonly known as: TAMBOCOR TAKE 1 TABLET(50 MG) BY MOUTH TWICE DAILY What changed: See the new instructions.   FLUoxetine 20 MG tablet Commonly known as: PROZAC TAKE 1 TABLET(20 MG) BY MOUTH DAILY What changed: See the new instructions.   gabapentin 100 MG capsule Commonly known as: NEURONTIN TAKE 2 CAPSULES(200 MG) BY MOUTH THREE TIMES DAILY What changed: See the new instructions.   metoprolol succinate 25 MG 24 hr tablet Commonly known as: Toprol XL Take 0.5 tablets (12.5 mg total) by mouth daily. What changed: when to take this   SYSTANE OP Place 1 drop into both eyes in the morning and at bedtime.   TYLENOL PO Take 2 tablets by mouth daily as needed (pain).   ZINC OXIDE (TOPICAL) EX Apply 1 Application topically as needed (rash under stomach).        Allergies  Allergen Reactions   Rosuvastatin Other (See Comments)    Musculoskeletal Aches Muscle spasms   Sulfa Antibiotics Rash    Childhood reaction    Latex Rash    Consultations: None   Procedures/Studies: ECHOCARDIOGRAM COMPLETE  Result Date: 10/14/2022    ECHOCARDIOGRAM REPORT   Patient Name:   Melissa Rodgers Date of Exam: 10/14/2022 Medical Rec #:  [id number]      Height:       63.0 in Accession #:    [id number]     Weight:       171.5 lb Date of Birth:  Jul 29, 1951      BSA:          1.811 m Patient Age:    71 years       BP:           128/73 mmHg Patient Gender: F              HR:           70 bpm. Exam Location:  Inpatient Procedure: 2D Echo, Color Doppler, Cardiac Doppler and Intracardiac            Opacification Agent Indications:    Chest Pain  History:        Patient has prior history of Echocardiogram examinations, most                 recent  05/03/2021. Arrythmias:Atrial Fibrillation,                 Signs/Symptoms:Shortness of Breath and Chest Pain; Risk                 Factors:Hypertension, Dyslipidemia, Former Smoker and ETOH.  Sonographer:    Eartha Inch Referring Phys: [id number] MOHAMMAD L GARBA  Sonographer Comments: Technically difficult study due to poor echo windows and patient is obese. Image acquisition challenging due to patient body habitus and Image  acquisition challenging due to respiratory motion. IMPRESSIONS  1. Left ventricular ejection fraction, by estimation, is 65 to 70%. The left ventricle has normal function. The left ventricle has no regional wall motion abnormalities. Left ventricular diastolic parameters are consistent with Grade I diastolic dysfunction (impaired relaxation).  2. Right ventricular systolic function is normal. The right ventricular size is normal. There is normal pulmonary artery systolic pressure.  3. The mitral valve is grossly normal. Trivial mitral valve regurgitation.  4. The aortic valve is tricuspid. Aortic valve regurgitation is mild. Aortic valve sclerosis is present, with no evidence of aortic valve stenosis.  5. The inferior vena cava is normal in size with greater than 50% respiratory variability, suggesting right atrial pressure of 3 mmHg. Comparison(s): No significant change from prior study. Prior images reviewed side by side. FINDINGS  Left Ventricle: Left ventricular ejection fraction, by estimation, is 65 to 70%. The left ventricle has normal function. The left ventricle has no regional wall motion abnormalities. The left ventricular internal cavity size was normal in size. There is  no left ventricular hypertrophy. Left ventricular diastolic parameters are consistent with Grade I diastolic dysfunction (impaired relaxation). Right Ventricle: The right ventricular size is normal. No increase in right ventricular wall thickness. Right ventricular systolic function is normal. There is normal  pulmonary artery systolic pressure. The tricuspid regurgitant velocity is 2.28 m/s, and  with an assumed right atrial pressure of 3 mmHg, the estimated right ventricular systolic pressure is 65.6 mmHg. Left Atrium: Left atrial size was normal in size. Right Atrium: Right atrial size was normal in size. Pericardium: Trivial pericardial effusion is present. Mitral Valve: The mitral valve is grossly normal. Mild mitral annular calcification. Trivial mitral valve regurgitation. Tricuspid Valve: The tricuspid valve is normal in structure. Tricuspid valve regurgitation is mild . No evidence of tricuspid stenosis. Aortic Valve: The aortic valve is tricuspid. Aortic valve regurgitation is mild. Aortic regurgitation PHT measures 694 msec. Aortic valve sclerosis is present, with no evidence of aortic valve stenosis. Pulmonic Valve: The pulmonic valve was normal in structure. Pulmonic valve regurgitation is mild. No evidence of pulmonic stenosis. Aorta: The aortic root, ascending aorta and aortic arch are all structurally normal, with no evidence of dilitation or obstruction. Venous: The inferior vena cava is normal in size with greater than 50% respiratory variability, suggesting right atrial pressure of 3 mmHg. IAS/Shunts: No atrial level shunt detected by color flow Doppler.  LEFT VENTRICLE PLAX 2D LVIDd:         4.50 cm     Diastology LVIDs:         2.90 cm     LV e' medial:    6.20 cm/s LV PW:         0.80 cm     LV E/e' medial:  11.7 LV IVS:        0.90 cm     LV e' lateral:   8.55 cm/s LVOT diam:     1.90 cm     LV E/e' lateral: 8.5 LV SV:         80 LV SV Index:   44 LVOT Area:     2.84 cm  LV Volumes (MOD) LV vol d, MOD A2C: 41.0 ml LV vol d, MOD A4C: 79.4 ml LV vol s, MOD A2C: 8.6 ml LV vol s, MOD A4C: 28.2 ml LV SV MOD A2C:     32.4 ml LV SV MOD A4C:     79.4 ml LV SV MOD BP:  43.9 ml RIGHT VENTRICLE            IVC RV S prime:     9.67 cm/s  IVC diam: 1.40 cm TAPSE (M-mode): 2.1 cm LEFT ATRIUM             Index         RIGHT ATRIUM           Index LA diam:        4.10 cm 2.26 cm/m   RA Area:     16.60 cm LA Vol (A2C):   47.1 ml 26.00 ml/m  RA Volume:   43.50 ml  24.01 ml/m LA Vol (A4C):   56.0 ml 30.91 ml/m LA Biplane Vol: 55.2 ml 30.47 ml/m  AORTIC VALVE LVOT Vmax:   113.00 cm/s LVOT Vmean:  87.400 cm/s LVOT VTI:    0.281 m AI PHT:      694 msec  AORTA Ao Root diam: 2.80 cm Ao Asc diam:  3.60 cm MITRAL VALVE               TRICUSPID VALVE MV Area (PHT): 2.75 cm    TR Peak grad:   20.8 mmHg MV Decel Time: 276 msec    TR Mean grad:   15.0 mmHg MV E velocity: 72.80 cm/s  TR Vmax:        228.00 cm/s MV A velocity: 95.50 cm/s  TR Vmean:       183.0 cm/s MV E/A ratio:  0.76                            SHUNTS                            Systemic VTI:  0.28 m                            Systemic Diam: 1.90 cm Rudean Haskell MD Electronically signed by Rudean Haskell MD Signature Date/Time: 10/14/2022/3:31:28 PM    Final    CT Angio Chest/Abd/Pel for Dissection W and/or Wo Contrast  Result Date: 10/13/2022 CLINICAL DATA:  Acute aortic syndrome suspected. EXAM: CT ANGIOGRAPHY CHEST, ABDOMEN AND PELVIS TECHNIQUE: Non-contrast CT of the chest was initially obtained. Multidetector CT imaging through the chest, abdomen and pelvis was performed using the standard protocol during bolus administration of intravenous contrast. Multiplanar reconstructed images and MIPs were obtained and reviewed to evaluate the vascular anatomy. RADIATION DOSE REDUCTION: This exam was performed according to the departmental dose-optimization program which includes automated exposure control, adjustment of the mA and/or kV according to patient size and/or use of iterative reconstruction technique. CONTRAST:  180m OMNIPAQUE IOHEXOL 350 MG/ML SOLN COMPARISON:  Chest radiograph dated 10/13/2022. FINDINGS: CTA CHEST FINDINGS Cardiovascular: There is no cardiomegaly or pericardial effusion. The thoracic aorta is unremarkable. The origins of  the great vessels of the aortic arch are patent. No pulmonary artery embolus identified. Mediastinum/Nodes: There is no hilar or mediastinal adenopathy. The esophagus is grossly unremarkable. No mediastinal fluid collection. Lungs/Pleura: The lungs are clear. There is no pleural effusion or pneumothorax. The central airways are patent. Musculoskeletal: No acute osseous pathology. Review of the MIP images confirms the above findings. CTA ABDOMEN AND PELVIS FINDINGS VASCULAR Aorta: Normal caliber aorta without aneurysm, dissection, vasculitis or significant stenosis. Celiac: Patent without evidence of aneurysm, dissection, vasculitis or significant stenosis. SMA: Patent  without evidence of aneurysm, dissection, vasculitis or significant stenosis. Renals: Both renal arteries are patent without evidence of aneurysm, dissection, vasculitis, fibromuscular dysplasia or significant stenosis. IMA: Patent without evidence of aneurysm, dissection, vasculitis or significant stenosis. Inflow: Patent without evidence of aneurysm, dissection, vasculitis or significant stenosis. Veins: No obvious venous abnormality within the limitations of this arterial phase study. Review of the MIP images confirms the above findings. NON-VASCULAR No intra-abdominal free air or free fluid. Hepatobiliary: Probable mild fatty liver. No biliary dilatation. The gallbladder is unremarkable. Pancreas: Unremarkable. No pancreatic ductal dilatation or surrounding inflammatory changes. Spleen: Normal in size without focal abnormality. Adrenals/Urinary Tract: The adrenal glands unremarkable. Subcentimeter left renal upper pole hypodense focus is too small to characterize. This can be better evaluated with ultrasound on a nonemergent/outpatient basis if clinically indicated. The kidneys, visualized ureters, and urinary bladder otherwise appear unremarkable. Stomach/Bowel: Postsurgical changes of Nissen fundoplication. There is no bowel obstruction or active  inflammation. Several small scattered sigmoid diverticula without active inflammatory changes. The appendix is normal. Lymphatic: No adenopathy. Reproductive: The uterus is retroverted. Small uterine fibroids noted. No adnexal masses. Other: None Musculoskeletal: Degenerative changes of the spine. No acute osseous pathology. Review of the MIP images confirms the above findings. IMPRESSION: 1. No acute intrathoracic, abdominal, or pelvic pathology. No aortic aneurysm or dissection. 2. Mild sigmoid diverticulosis. No bowel obstruction. Normal appendix. Electronically Signed   By: Anner Crete M.D.   On: 10/13/2022 19:58   DG Chest 2 View  Result Date: 10/13/2022 CLINICAL DATA:  Chest pain EXAM: CHEST - 2 VIEW COMPARISON:  09/26/2021 FINDINGS: Transverse diameter of heart is increased. There are no signs of pulmonary edema or focal pulmonary consolidation. There is no pleural effusion or pneumothorax. IMPRESSION: No active cardiopulmonary disease. Electronically Signed   By: Elmer Picker M.D.   On: 10/13/2022 16:35   DG Lumbar Spine Complete  Result Date: 09/24/2022 CLINICAL DATA:  Chronic low back pain, increased over the past month. The pain is in the region of the SI joints. EXAM: LUMBAR SPINE - COMPLETE 4+ VIEW COMPARISON:  Bilateral SI joints obtained at the same time. FINDINGS: Five non-rib-bearing lumbar vertebrae. Mild levoconvex thoracolumbar scoliosis. Marked disc space narrowing and moderate anterior and posterior spur formation at the L2-3 level. There is also grade 1 retrolisthesis at that level. Mild anterior spur formation at multiple levels of the lumbar and lower thoracic spine. Facet degenerative changes throughout the lumbar spine. No fractures or pars defects seen. IMPRESSION: 1. Degenerative changes, as described above. 2. No acute abnormality. Electronically Signed   By: Claudie Revering M.D.   On: 09/24/2022 20:55   DG Si Joints  Result Date: 09/24/2022 CLINICAL DATA:  Left  sacroiliac pain. EXAM: BILATERAL SACROILIAC JOINTS - 3+ VIEW COMPARISON:  None Available. FINDINGS: Normal appearing sacroiliac joints. Lower lumbar spine degenerative changes. IMPRESSION: 1. Normal sacroiliac joints. 2. Lower lumbar spine degenerative changes. Electronically Signed   By: Claudie Revering M.D.   On: 09/24/2022 20:52      Subjective: Patient seen and examined at bedside today.  Hemodynamically stable for discharge.  Free of chest pain during my evaluation today.  Eager to go home  Discharge Exam: Vitals:   10/14/22 1053 10/14/22 1255  BP:  128/73  Pulse:  68  Resp:  20  Temp: 98.2 F (36.8 C) 98.3 F (36.8 C)  SpO2:  98%   Vitals:   10/14/22 0254 10/14/22 0533 10/14/22 1053 10/14/22 1255  BP: 102/75 112/70  128/73  Pulse: 67 63  68  Resp: '18 16  20  '$ Temp: 98 F (36.7 C) 97.6 F (36.4 C) 98.2 F (36.8 C) 98.3 F (36.8 C)  TempSrc: Oral  Oral Oral  SpO2: 97% 98%  98%  Weight:      Height:        General: Pt is alert, awake, not in acute distress Cardiovascular: RRR, S1/S2 +, no rubs, no gallops Respiratory: CTA bilaterally, no wheezing, no rhonchi Abdominal: Soft, NT, ND, bowel sounds + Extremities: no edema, no cyanosis    The results of significant diagnostics from this hospitalization (including imaging, microbiology, ancillary and laboratory) are listed below for reference.     Microbiology: No results found for this or any previous visit (from the past 240 hour(s)).   Labs: BNP (last 3 results) No results for input(s): "BNP" in the last 8760 hours. Basic Metabolic Panel: Recent Labs  Lab 10/13/22 1615  NA 138  K 3.7  CL 105  CO2 24  GLUCOSE 94  BUN 20  CREATININE 0.63  CALCIUM 9.5   Liver Function Tests: Recent Labs  Lab 10/13/22 1639  AST 22  ALT 16  ALKPHOS 65  BILITOT 0.5  PROT 7.3  ALBUMIN 3.8   Recent Labs  Lab 10/13/22 1639  LIPASE 46   No results for input(s): "AMMONIA" in the last 168 hours. CBC: Recent Labs   Lab 10/13/22 1615  WBC 7.6  HGB 14.0  HCT 43.1  MCV 89.2  PLT 281   Cardiac Enzymes: No results for input(s): "CKTOTAL", "CKMB", "CKMBINDEX", "TROPONINI" in the last 168 hours. BNP: Invalid input(s): "POCBNP" CBG: No results for input(s): "GLUCAP" in the last 168 hours. D-Dimer Recent Labs    10/13/22 1815  DDIMER 1.32*   Hgb A1c No results for input(s): "HGBA1C" in the last 72 hours. Lipid Profile No results for input(s): "CHOL", "HDL", "LDLCALC", "TRIG", "CHOLHDL", "LDLDIRECT" in the last 72 hours. Thyroid function studies No results for input(s): "TSH", "T4TOTAL", "T3FREE", "THYROIDAB" in the last 72 hours.  Invalid input(s): "FREET3" Anemia work up No results for input(s): "VITAMINB12", "FOLATE", "FERRITIN", "TIBC", "IRON", "RETICCTPCT" in the last 72 hours. Urinalysis    Component Value Date/Time   COLORURINE YELLOW 07/01/2021 0000   APPEARANCEUR CLEAR 07/01/2021 0000   LABSPEC 1.016 07/01/2021 0000   PHURINE 5.5 07/01/2021 0000   GLUCOSEU NEGATIVE 07/01/2021 0000   HGBUR TRACE (A) 07/01/2021 0000   BILIRUBINUR NEGATIVE 04/29/2021 1123   KETONESUR NEGATIVE 07/01/2021 0000   PROTEINUR NEGATIVE 07/01/2021 0000   NITRITE NEGATIVE 07/01/2021 0000   LEUKOCYTESUR TRACE (A) 07/01/2021 0000   Sepsis Labs Recent Labs  Lab 10/13/22 1615  WBC 7.6   Microbiology No results found for this or any previous visit (from the past 240 hour(s)).  Please note: You were cared for by a hospitalist during your hospital stay. Once you are discharged, your primary care physician will handle any further medical issues. Please note that NO REFILLS for any discharge medications will be authorized once you are discharged, as it is imperative that you return to your primary care physician (or establish a relationship with a primary care physician if you do not have one) for your post hospital discharge needs so that they can reassess your need for medications and monitor your lab  values.    Time coordinating discharge: 40 minutes  SIGNED:   Shelly Coss, MD  [facility] 10/14/2022, 3:37 PM Pager [phone]  If 7PM-7AM, please contact night-coverage [email or web address] Password [email or web address]

## 2022-10-14 NOTE — Progress Notes (Signed)
  Echocardiogram 2D Echocardiogram has been performed.  Melissa Rodgers 10/14/2022, 2:35 PM

## 2022-10-15 DIAGNOSIS — M9902 Segmental and somatic dysfunction of thoracic region: Secondary | ICD-10-CM | POA: Diagnosis not present

## 2022-10-15 DIAGNOSIS — M4723 Other spondylosis with radiculopathy, cervicothoracic region: Secondary | ICD-10-CM | POA: Diagnosis not present

## 2022-10-15 DIAGNOSIS — M546 Pain in thoracic spine: Secondary | ICD-10-CM | POA: Diagnosis not present

## 2022-10-15 DIAGNOSIS — M9901 Segmental and somatic dysfunction of cervical region: Secondary | ICD-10-CM | POA: Diagnosis not present

## 2022-10-21 DIAGNOSIS — M4723 Other spondylosis with radiculopathy, cervicothoracic region: Secondary | ICD-10-CM | POA: Diagnosis not present

## 2022-10-21 DIAGNOSIS — M546 Pain in thoracic spine: Secondary | ICD-10-CM | POA: Diagnosis not present

## 2022-10-21 DIAGNOSIS — M9902 Segmental and somatic dysfunction of thoracic region: Secondary | ICD-10-CM | POA: Diagnosis not present

## 2022-10-21 DIAGNOSIS — M9901 Segmental and somatic dysfunction of cervical region: Secondary | ICD-10-CM | POA: Diagnosis not present

## 2022-10-24 DIAGNOSIS — M9902 Segmental and somatic dysfunction of thoracic region: Secondary | ICD-10-CM | POA: Diagnosis not present

## 2022-10-24 DIAGNOSIS — M9901 Segmental and somatic dysfunction of cervical region: Secondary | ICD-10-CM | POA: Diagnosis not present

## 2022-10-24 DIAGNOSIS — M546 Pain in thoracic spine: Secondary | ICD-10-CM | POA: Diagnosis not present

## 2022-10-24 DIAGNOSIS — M4723 Other spondylosis with radiculopathy, cervicothoracic region: Secondary | ICD-10-CM | POA: Diagnosis not present

## 2022-10-28 DIAGNOSIS — M546 Pain in thoracic spine: Secondary | ICD-10-CM | POA: Diagnosis not present

## 2022-10-28 DIAGNOSIS — M9902 Segmental and somatic dysfunction of thoracic region: Secondary | ICD-10-CM | POA: Diagnosis not present

## 2022-10-28 DIAGNOSIS — M9901 Segmental and somatic dysfunction of cervical region: Secondary | ICD-10-CM | POA: Diagnosis not present

## 2022-10-28 DIAGNOSIS — M4723 Other spondylosis with radiculopathy, cervicothoracic region: Secondary | ICD-10-CM | POA: Diagnosis not present

## 2022-10-31 DIAGNOSIS — M4723 Other spondylosis with radiculopathy, cervicothoracic region: Secondary | ICD-10-CM | POA: Diagnosis not present

## 2022-10-31 DIAGNOSIS — M9901 Segmental and somatic dysfunction of cervical region: Secondary | ICD-10-CM | POA: Diagnosis not present

## 2022-10-31 DIAGNOSIS — M546 Pain in thoracic spine: Secondary | ICD-10-CM | POA: Diagnosis not present

## 2022-10-31 DIAGNOSIS — M9902 Segmental and somatic dysfunction of thoracic region: Secondary | ICD-10-CM | POA: Diagnosis not present

## 2022-11-03 ENCOUNTER — Ambulatory Visit (INDEPENDENT_AMBULATORY_CARE_PROVIDER_SITE_OTHER): Payer: Medicare Other | Admitting: Sports Medicine

## 2022-11-03 ENCOUNTER — Other Ambulatory Visit: Payer: Self-pay | Admitting: Medical-Surgical

## 2022-11-03 DIAGNOSIS — M47817 Spondylosis without myelopathy or radiculopathy, lumbosacral region: Secondary | ICD-10-CM

## 2022-11-03 MED ORDER — PREGABALIN 50 MG PO CAPS: ORAL_CAPSULE | ORAL | 1 refills | Status: DC

## 2022-11-03 NOTE — Assessment & Plan Note (Signed)
Very pleasant 71 year old female, chronic axial low back pain with radiation down both lateral thighs with numbness and tingling, she has been seeing a chiropractor with moderate improvement, she improved to some degree with prednisone. X-rays showed multilevel degenerative changes, today her pain is worse with standing, more midline, I suspect pain is coming more from her facet joints or lumbar spinal stenosis. At this point she has failed greater than 6 weeks of conservative treatment, we will go ahead and add Lyrica, she has failed gabapentin, tramadol. She gets too much constipation with other narcotics. We will also add a lumbar spine MRI for epidural planning.

## 2022-11-03 NOTE — Progress Notes (Signed)
    Procedures performed today:    None.  Independent interpretation of notes and tests performed by another provider:   None.  Brief History, Exam, Impression, and Recommendations:    Lumbosacral spondylosis Very pleasant 71 year old female, chronic axial low back pain with radiation down both lateral thighs with numbness and tingling, she has been seeing a chiropractor with moderate improvement, she improved to some degree with prednisone. X-rays showed multilevel degenerative changes, today her pain is worse with standing, more midline, I suspect pain is coming more from her facet joints or lumbar spinal stenosis. At this point she has failed greater than 6 weeks of conservative treatment, we will go ahead and add Lyrica, she has failed gabapentin, tramadol. She gets too much constipation with other narcotics. We will also add a lumbar spine MRI for epidural planning.  Chronic process with exacerbation and pharmacologic intervention  ____________________________________________ [name], M.D., ABFM., CAQSM., AME. Primary Care and Sports Medicine [facility]  Adjunct Professor of [employer]  Risk manager

## 2022-11-04 DIAGNOSIS — M9901 Segmental and somatic dysfunction of cervical region: Secondary | ICD-10-CM | POA: Diagnosis not present

## 2022-11-04 DIAGNOSIS — M4723 Other spondylosis with radiculopathy, cervicothoracic region: Secondary | ICD-10-CM | POA: Diagnosis not present

## 2022-11-04 DIAGNOSIS — M546 Pain in thoracic spine: Secondary | ICD-10-CM | POA: Diagnosis not present

## 2022-11-04 DIAGNOSIS — M9902 Segmental and somatic dysfunction of thoracic region: Secondary | ICD-10-CM | POA: Diagnosis not present

## 2022-11-07 DIAGNOSIS — M4723 Other spondylosis with radiculopathy, cervicothoracic region: Secondary | ICD-10-CM | POA: Diagnosis not present

## 2022-11-07 DIAGNOSIS — M546 Pain in thoracic spine: Secondary | ICD-10-CM | POA: Diagnosis not present

## 2022-11-07 DIAGNOSIS — M9901 Segmental and somatic dysfunction of cervical region: Secondary | ICD-10-CM | POA: Diagnosis not present

## 2022-11-07 DIAGNOSIS — M9902 Segmental and somatic dysfunction of thoracic region: Secondary | ICD-10-CM | POA: Diagnosis not present

## 2022-11-09 ENCOUNTER — Ambulatory Visit (INDEPENDENT_AMBULATORY_CARE_PROVIDER_SITE_OTHER): Payer: Medicare Other

## 2022-11-09 DIAGNOSIS — M47817 Spondylosis without myelopathy or radiculopathy, lumbosacral region: Secondary | ICD-10-CM | POA: Diagnosis not present

## 2022-11-09 DIAGNOSIS — M545 Low back pain, unspecified: Secondary | ICD-10-CM | POA: Diagnosis not present

## 2022-11-09 DIAGNOSIS — M5126 Other intervertebral disc displacement, lumbar region: Secondary | ICD-10-CM | POA: Diagnosis not present

## 2022-11-11 DIAGNOSIS — M9901 Segmental and somatic dysfunction of cervical region: Secondary | ICD-10-CM | POA: Diagnosis not present

## 2022-11-11 DIAGNOSIS — M4723 Other spondylosis with radiculopathy, cervicothoracic region: Secondary | ICD-10-CM | POA: Diagnosis not present

## 2022-11-11 DIAGNOSIS — M546 Pain in thoracic spine: Secondary | ICD-10-CM | POA: Diagnosis not present

## 2022-11-11 DIAGNOSIS — M9902 Segmental and somatic dysfunction of thoracic region: Secondary | ICD-10-CM | POA: Diagnosis not present

## 2022-11-19 DIAGNOSIS — M546 Pain in thoracic spine: Secondary | ICD-10-CM | POA: Diagnosis not present

## 2022-11-19 DIAGNOSIS — M9901 Segmental and somatic dysfunction of cervical region: Secondary | ICD-10-CM | POA: Diagnosis not present

## 2022-11-19 DIAGNOSIS — M9902 Segmental and somatic dysfunction of thoracic region: Secondary | ICD-10-CM | POA: Diagnosis not present

## 2022-11-19 DIAGNOSIS — M4723 Other spondylosis with radiculopathy, cervicothoracic region: Secondary | ICD-10-CM | POA: Diagnosis not present

## 2022-11-21 DIAGNOSIS — M4723 Other spondylosis with radiculopathy, cervicothoracic region: Secondary | ICD-10-CM | POA: Diagnosis not present

## 2022-11-21 DIAGNOSIS — M9901 Segmental and somatic dysfunction of cervical region: Secondary | ICD-10-CM | POA: Diagnosis not present

## 2022-11-21 DIAGNOSIS — M9902 Segmental and somatic dysfunction of thoracic region: Secondary | ICD-10-CM | POA: Diagnosis not present

## 2022-11-21 DIAGNOSIS — M546 Pain in thoracic spine: Secondary | ICD-10-CM | POA: Diagnosis not present

## 2022-11-25 DIAGNOSIS — M9902 Segmental and somatic dysfunction of thoracic region: Secondary | ICD-10-CM | POA: Diagnosis not present

## 2022-11-25 DIAGNOSIS — M546 Pain in thoracic spine: Secondary | ICD-10-CM | POA: Diagnosis not present

## 2022-11-25 DIAGNOSIS — M4723 Other spondylosis with radiculopathy, cervicothoracic region: Secondary | ICD-10-CM | POA: Diagnosis not present

## 2022-11-25 DIAGNOSIS — M9901 Segmental and somatic dysfunction of cervical region: Secondary | ICD-10-CM | POA: Diagnosis not present

## 2022-11-28 DIAGNOSIS — M9902 Segmental and somatic dysfunction of thoracic region: Secondary | ICD-10-CM | POA: Diagnosis not present

## 2022-11-28 DIAGNOSIS — M4723 Other spondylosis with radiculopathy, cervicothoracic region: Secondary | ICD-10-CM | POA: Diagnosis not present

## 2022-11-28 DIAGNOSIS — M9901 Segmental and somatic dysfunction of cervical region: Secondary | ICD-10-CM | POA: Diagnosis not present

## 2022-11-28 DIAGNOSIS — M546 Pain in thoracic spine: Secondary | ICD-10-CM | POA: Diagnosis not present

## 2022-12-01 ENCOUNTER — Ambulatory Visit: Payer: Medicare Other | Admitting: Sports Medicine

## 2022-12-02 ENCOUNTER — Ambulatory Visit: Payer: Medicare Other | Admitting: Sports Medicine

## 2022-12-03 DIAGNOSIS — M9901 Segmental and somatic dysfunction of cervical region: Secondary | ICD-10-CM | POA: Diagnosis not present

## 2022-12-03 DIAGNOSIS — M9902 Segmental and somatic dysfunction of thoracic region: Secondary | ICD-10-CM | POA: Diagnosis not present

## 2022-12-03 DIAGNOSIS — M4723 Other spondylosis with radiculopathy, cervicothoracic region: Secondary | ICD-10-CM | POA: Diagnosis not present

## 2022-12-03 DIAGNOSIS — M546 Pain in thoracic spine: Secondary | ICD-10-CM | POA: Diagnosis not present

## 2022-12-10 ENCOUNTER — Encounter: Payer: Self-pay | Admitting: Medical-Surgical

## 2022-12-10 ENCOUNTER — Ambulatory Visit (INDEPENDENT_AMBULATORY_CARE_PROVIDER_SITE_OTHER): Payer: Medicare Other | Admitting: Medical-Surgical

## 2022-12-10 VITALS — BP 112/70 | HR 61 | Resp 20 | Ht 63.0 in | Wt 173.0 lb

## 2022-12-10 DIAGNOSIS — J3089 Other allergic rhinitis: Secondary | ICD-10-CM

## 2022-12-10 DIAGNOSIS — R739 Hyperglycemia, unspecified: Secondary | ICD-10-CM

## 2022-12-10 DIAGNOSIS — M47817 Spondylosis without myelopathy or radiculopathy, lumbosacral region: Secondary | ICD-10-CM

## 2022-12-10 DIAGNOSIS — F410 Panic disorder [episodic paroxysmal anxiety] without agoraphobia: Secondary | ICD-10-CM

## 2022-12-10 DIAGNOSIS — K219 Gastro-esophageal reflux disease without esophagitis: Secondary | ICD-10-CM | POA: Diagnosis not present

## 2022-12-10 DIAGNOSIS — E785 Hyperlipidemia, unspecified: Secondary | ICD-10-CM

## 2022-12-10 DIAGNOSIS — I10 Essential (primary) hypertension: Secondary | ICD-10-CM

## 2022-12-10 DIAGNOSIS — K224 Dyskinesia of esophagus: Secondary | ICD-10-CM

## 2022-12-10 DIAGNOSIS — F411 Generalized anxiety disorder: Secondary | ICD-10-CM

## 2022-12-10 DIAGNOSIS — I480 Paroxysmal atrial fibrillation: Secondary | ICD-10-CM

## 2022-12-10 DIAGNOSIS — R3989 Other symptoms and signs involving the genitourinary system: Secondary | ICD-10-CM | POA: Diagnosis not present

## 2022-12-10 MED ORDER — PREGABALIN 75 MG PO CAPS: ORAL_CAPSULE | ORAL | 2 refills | Status: DC

## 2022-12-10 MED ORDER — CLONAZEPAM 0.5 MG PO TABS: 0.2500 mg | ORAL_TABLET | Freq: Every day | ORAL | 0 refills | Status: DC | PRN

## 2022-12-10 NOTE — Progress Notes (Signed)
Established Patient Office Visit  Subjective   Patient ID: Melissa Rodgers, female   DOB: Jun 24, 1951 Age: 72 y.o. MRN: [id number]   Chief Complaint  Patient presents with   Follow-up   Hypertension   HPI Pleasant 72 year old female presenting today for chronic disease follow-up.  Interval update on multiple issues:  Breast lump: Started experiencing itching on her right breast.  She was evaluated by Dr. Mel Almond and sent to the breast center for further evaluation.  They did find a lump in the area but after further evaluation figured that it was benign.  She started using cortisone cream to the itchy area.  After about 10 days of cortisone cream, the itchiness resolved and she has had no further issues.  ED visit: On 10/13/2022, she went to the ED for pleuritic chest pain that also involved left arm/neck/jaw pain, dizziness, and nausea.  She was kept overnight to complete workup.  She was discharged with the suggestion for getting a stress test done.  She has not completed this but has an appointment with her cardiologist next month and plans to talk to him then.  No further episodes of chest pain.  Back pain: She had severe back pain for approximately 1 month that involved difficulty with standing.  She saw Dr. Dianah Field who ordered an MRI.  Injections were suggested however between the MRI and now, her back pain has improved.  She saw his chiropractor which helped with both back and neck pain.  She used a couple of days of half dose Norco that she had from her previous injury and then followed up with intermittent Tylenol use.  She still has some aches in the area which is worse during the later part of the day but the pain is nowhere near as severe as it was.  She was previously treated for pain with gabapentin however was switched to Lyrica.  She started right after Christmas and has decided to work her way up to the recommended dose and slowly.  Starting last week, she increased her dose to  100 mg twice daily.  Now she reports that she has been feeling significant fatigue, headache, nausea and just overall "yucky".  Did not have any of the symptoms on the lower dose of Lyrica.  Bladder pain: Does have a history of bladder pain that required urethral dilation in the past.  Lately has been having increased pain in the suprapubic area as well as discomfort with walking.  Notes that her symptoms have improved over the last couple of days and she is hoping that they will completely resolve soon.  Has a history of having swallowing spasms.  This is so much worse when she eats cold foods or drinks cold beverages.  Had an episode yesterday where it lasted approximately 5 minutes before resolving.  Today, she reports that she is taking all of her medications as prescribed, tolerating well without side effects.  She does not need refills on anything except for clonazepam which she uses extremely sparingly due to oversedation.  She did have an episode with a chest pain that prompted her to take a dose.  The medication works well but she would prefer to avoid using this on a daily basis.  Her last prescription was for 10 tablets over 1 year ago.  Today she is requesting a refill since she is completely out.   Objective:    Vitals:   12/10/22 1015  BP: 112/70  Pulse: 61  Resp: 20  Height:  $'5\' 3"'A$  (1.6 m)  Weight: 173 lb 0.6 oz (78.5 kg)  SpO2: 95%  BMI (Calculated): 30.66    Physical Exam Vitals reviewed.  Constitutional:      General: She is not in acute distress.    Appearance: Normal appearance. She is not ill-appearing.  HENT:     Head: Normocephalic and atraumatic.  Cardiovascular:     Rate and Rhythm: Normal rate and regular rhythm.     Pulses: Normal pulses.     Heart sounds: Normal heart sounds.  Pulmonary:     Effort: Pulmonary effort is normal. No respiratory distress.     Breath sounds: Normal breath sounds. No wheezing, rhonchi or rales.  Skin:    General: Skin is warm  and dry.  Neurological:     Mental Status: She is alert and oriented to person, place, and time.  Psychiatric:        Mood and Affect: Mood normal.        Behavior: Behavior normal.        Thought Content: Thought content normal.        Judgment: Judgment normal.   No results found for this or any previous visit (from the past 24 hour(s)).     The 10-year ASCVD risk score (Arnett DK, et al., 2019) is: 12.4%   Values used to calculate the score:     Age: 7 years     Sex: Female     Is Non-Hispanic African American: No     Diabetic: No     Tobacco smoker: No     Systolic Blood Pressure: 573 mmHg     Is BP treated: Yes     HDL Cholesterol: 52 mg/dL     Total Cholesterol: 217 mg/dL   Assessment & Plan:   1. Essential hypertension Recent labs checked for CBC and CMP.  Adding lipid panel today.  Blood pressure is at goal today.  Continue Toprol-XL 12.5 mg daily as prescribed.  Continue follow-up with cardiology as recommended.  2. Paroxysmal atrial fibrillation ([place]) Managed with flecainide and Eliquis.  Managed by cardiology.  3. Gastroesophageal reflux disease without esophagitis Stable. Continue as needed Tums/Pepto-Bismol.  4. Non-seasonal allergic rhinitis, unspecified trigger Continue Zyrtec 10 mg daily.  5. Generalized anxiety disorder with panic attacks Stable.  Continue fluoxetine 20 mg daily.  Recommend very sparing use of clonazepam.  Since 10 tablets has lasted her over a year, I will be glad to refill today as long as she continues to use very sparingly.  6. Hyperlipidemia, unspecified hyperlipidemia type Checking lipid panel today. - Lipid panel  7. Hyperglycemia, unspecified Checking hemoglobin A1c today. - Hemoglobin A1c  8. Spondylosis of lumbosacral region, unspecified spinal osteoarthritis complication status She seems to be a little overmedicated and is not tolerating the 100 mg twice daily dosing.  Reducing to 75 mg twice daily to see if this is more  beneficial without the side effects.  Follow-up with Dr. Darene Lamer as recommended. - pregabalin (LYRICA) 75 MG capsule; 1 capsule p.o. nightly for a week then p.o. twice daily for a week, then 2 capsules p.o. twice daily  Dispense: 60 capsule; Refill: 2  9. Bladder pain Offered to run a POCT urinalysis today for further investigation.  She notes that she has had this done in the past many times with no abnormalities found.  She would like to monitor her symptoms over the next few days and if it worsens, she will return for urinalysis.  10. Esophageal  spasm Advised to avoid cold foods and liquids whenever possible.  If she cannot avoid them, she should take slow small bites or sips to help prevent spasm.  She has a GI provider in place and will reach out to them if this becomes more frequent or bothersome.  Return in about 6 months (around 06/10/2023) for chronic disease follow up.  ___________________________________________ Clearnce Sorrel, DNP, APRN, FNP-BC Primary Care and [place]

## 2022-12-11 DIAGNOSIS — M4723 Other spondylosis with radiculopathy, cervicothoracic region: Secondary | ICD-10-CM | POA: Diagnosis not present

## 2022-12-11 DIAGNOSIS — M9902 Segmental and somatic dysfunction of thoracic region: Secondary | ICD-10-CM | POA: Diagnosis not present

## 2022-12-11 DIAGNOSIS — M546 Pain in thoracic spine: Secondary | ICD-10-CM | POA: Diagnosis not present

## 2022-12-11 DIAGNOSIS — M9901 Segmental and somatic dysfunction of cervical region: Secondary | ICD-10-CM | POA: Diagnosis not present

## 2022-12-15 ENCOUNTER — Ambulatory Visit (INDEPENDENT_AMBULATORY_CARE_PROVIDER_SITE_OTHER): Payer: Medicare Other | Admitting: Sports Medicine

## 2022-12-15 DIAGNOSIS — M47817 Spondylosis without myelopathy or radiculopathy, lumbosacral region: Secondary | ICD-10-CM | POA: Diagnosis not present

## 2022-12-15 MED ORDER — HYDROCODONE-ACETAMINOPHEN 5-325 MG PO TABS: 1.0000 | ORAL_TABLET | Freq: Three times a day (TID) | ORAL | 0 refills | Status: DC | PRN

## 2022-12-15 NOTE — Progress Notes (Addendum)
    Procedures performed today:    None.  Independent interpretation of notes and tests performed by another provider:   None.  Brief History, Exam, Impression, and Recommendations:    Lumbosacral spondylosis This is a very pleasant 72 year old female, chronic axial low back pain with radiation down both lateral thighs, some numbness and tingling. She improved to some degree with prednisone. Ultimately we obtained an MRI that showed predominantly multilevel facet degenerative changes bilaterally from L3-S1. Her discs looked okay and she really did not have much in the lines of central canal stenosis, she did have some foraminal stenosis. She has done really well with Tylenol and Lyrica although she is only able to tolerate 50 to 75 mg nightly, was not able to tolerate higher doses or increased frequency. She will let me know what dose she lands on and a happy to refill it. I am also happy to refill her hydrocodone for occasional use. She can return to see me as needed, but if persistent discomfort I am happy to call in another burst of prednisone and set her up for facet joint injections.  Update: Patient has decided to self discontinue Lyrica, she has improved coming off of it.    ____________________________________________ [name], M.D., ABFM., CAQSM., AME. Primary Care and Sports Medicine [facility]  Adjunct Professor of [employer]  Risk manager

## 2022-12-15 NOTE — Assessment & Plan Note (Addendum)
This is a very pleasant 72 year old female, chronic axial low back pain with radiation down both lateral thighs, some numbness and tingling. She improved to some degree with prednisone. Ultimately we obtained an MRI that showed predominantly multilevel facet degenerative changes bilaterally from L3-S1. Her discs looked okay and she really did not have much in the lines of central canal stenosis, she did have some foraminal stenosis. She has done really well with Tylenol and Lyrica although she is only able to tolerate 50 to 75 mg nightly, was not able to tolerate higher doses or increased frequency. She will let me know what dose she lands on and a happy to refill it. I am also happy to refill her hydrocodone for occasional use. She can return to see me as needed, but if persistent discomfort I am happy to call in another burst of prednisone and set her up for facet joint injections.  Update: Patient has decided to self discontinue Lyrica, she has improved coming off of it.

## 2022-12-16 DIAGNOSIS — M9902 Segmental and somatic dysfunction of thoracic region: Secondary | ICD-10-CM | POA: Diagnosis not present

## 2022-12-16 DIAGNOSIS — M4723 Other spondylosis with radiculopathy, cervicothoracic region: Secondary | ICD-10-CM | POA: Diagnosis not present

## 2022-12-16 DIAGNOSIS — M9901 Segmental and somatic dysfunction of cervical region: Secondary | ICD-10-CM | POA: Diagnosis not present

## 2022-12-16 DIAGNOSIS — M546 Pain in thoracic spine: Secondary | ICD-10-CM | POA: Diagnosis not present

## 2022-12-18 DIAGNOSIS — R739 Hyperglycemia, unspecified: Secondary | ICD-10-CM | POA: Diagnosis not present

## 2022-12-18 DIAGNOSIS — E785 Hyperlipidemia, unspecified: Secondary | ICD-10-CM | POA: Diagnosis not present

## 2022-12-19 ENCOUNTER — Encounter: Payer: Self-pay | Admitting: Medical-Surgical

## 2022-12-19 DIAGNOSIS — M9902 Segmental and somatic dysfunction of thoracic region: Secondary | ICD-10-CM | POA: Diagnosis not present

## 2022-12-19 DIAGNOSIS — M4723 Other spondylosis with radiculopathy, cervicothoracic region: Secondary | ICD-10-CM | POA: Diagnosis not present

## 2022-12-19 DIAGNOSIS — M546 Pain in thoracic spine: Secondary | ICD-10-CM | POA: Diagnosis not present

## 2022-12-19 DIAGNOSIS — M9901 Segmental and somatic dysfunction of cervical region: Secondary | ICD-10-CM | POA: Diagnosis not present

## 2022-12-19 LAB — LIPID PANEL
Cholesterol: 226 mg/dL — ABNORMAL HIGH (ref <200)
HDL: 47 mg/dL — ABNORMAL LOW (ref >=50)
LDL Cholesterol (Calc): 140 mg/dL (calc) — ABNORMAL HIGH
Non-HDL Cholesterol (Calc): 179 mg/dL (calc) — ABNORMAL HIGH (ref <130)
Total CHOL/HDL Ratio: 4.8 (calc) (ref <5.0)
Triglycerides: 239 mg/dL — ABNORMAL HIGH (ref <150)

## 2022-12-19 LAB — HEMOGLOBIN A1C
Hgb A1c MFr Bld: 6 % of total Hgb — ABNORMAL HIGH (ref <5.7)
Mean Plasma Glucose: 126 mg/dL
eAG (mmol/L): 7 mmol/L

## 2022-12-23 DIAGNOSIS — M9901 Segmental and somatic dysfunction of cervical region: Secondary | ICD-10-CM | POA: Diagnosis not present

## 2022-12-23 DIAGNOSIS — M9902 Segmental and somatic dysfunction of thoracic region: Secondary | ICD-10-CM | POA: Diagnosis not present

## 2022-12-23 DIAGNOSIS — M4723 Other spondylosis with radiculopathy, cervicothoracic region: Secondary | ICD-10-CM | POA: Diagnosis not present

## 2022-12-23 DIAGNOSIS — M546 Pain in thoracic spine: Secondary | ICD-10-CM | POA: Diagnosis not present

## 2022-12-26 DIAGNOSIS — M4723 Other spondylosis with radiculopathy, cervicothoracic region: Secondary | ICD-10-CM | POA: Diagnosis not present

## 2022-12-26 DIAGNOSIS — M9901 Segmental and somatic dysfunction of cervical region: Secondary | ICD-10-CM | POA: Diagnosis not present

## 2022-12-26 DIAGNOSIS — M9902 Segmental and somatic dysfunction of thoracic region: Secondary | ICD-10-CM | POA: Diagnosis not present

## 2022-12-26 DIAGNOSIS — M546 Pain in thoracic spine: Secondary | ICD-10-CM | POA: Diagnosis not present

## 2022-12-30 DIAGNOSIS — M9902 Segmental and somatic dysfunction of thoracic region: Secondary | ICD-10-CM | POA: Diagnosis not present

## 2022-12-30 DIAGNOSIS — M9901 Segmental and somatic dysfunction of cervical region: Secondary | ICD-10-CM | POA: Diagnosis not present

## 2022-12-30 DIAGNOSIS — M546 Pain in thoracic spine: Secondary | ICD-10-CM | POA: Diagnosis not present

## 2022-12-30 DIAGNOSIS — M4723 Other spondylosis with radiculopathy, cervicothoracic region: Secondary | ICD-10-CM | POA: Diagnosis not present

## 2023-01-02 DIAGNOSIS — M4723 Other spondylosis with radiculopathy, cervicothoracic region: Secondary | ICD-10-CM | POA: Diagnosis not present

## 2023-01-02 DIAGNOSIS — M9902 Segmental and somatic dysfunction of thoracic region: Secondary | ICD-10-CM | POA: Diagnosis not present

## 2023-01-02 DIAGNOSIS — M9901 Segmental and somatic dysfunction of cervical region: Secondary | ICD-10-CM | POA: Diagnosis not present

## 2023-01-02 DIAGNOSIS — M546 Pain in thoracic spine: Secondary | ICD-10-CM | POA: Diagnosis not present

## 2023-01-03 ENCOUNTER — Encounter: Payer: Self-pay | Admitting: Sports Medicine

## 2023-01-05 ENCOUNTER — Other Ambulatory Visit: Payer: Self-pay | Admitting: Medical-Surgical

## 2023-01-06 DIAGNOSIS — M9901 Segmental and somatic dysfunction of cervical region: Secondary | ICD-10-CM | POA: Diagnosis not present

## 2023-01-06 DIAGNOSIS — M546 Pain in thoracic spine: Secondary | ICD-10-CM | POA: Diagnosis not present

## 2023-01-06 DIAGNOSIS — M4723 Other spondylosis with radiculopathy, cervicothoracic region: Secondary | ICD-10-CM | POA: Diagnosis not present

## 2023-01-06 DIAGNOSIS — M9902 Segmental and somatic dysfunction of thoracic region: Secondary | ICD-10-CM | POA: Diagnosis not present

## 2023-01-08 ENCOUNTER — Encounter: Payer: Self-pay | Admitting: Cardiology

## 2023-01-08 ENCOUNTER — Ambulatory Visit: Payer: Medicare Other | Attending: Cardiology | Admitting: Cardiology

## 2023-01-08 VITALS — BP 132/88 | HR 64 | Ht 63.0 in | Wt 171.4 lb

## 2023-01-08 DIAGNOSIS — Z7901 Long term (current) use of anticoagulants: Secondary | ICD-10-CM | POA: Diagnosis not present

## 2023-01-08 DIAGNOSIS — I480 Paroxysmal atrial fibrillation: Secondary | ICD-10-CM | POA: Diagnosis not present

## 2023-01-08 DIAGNOSIS — Z79899 Other long term (current) drug therapy: Secondary | ICD-10-CM | POA: Diagnosis not present

## 2023-01-08 DIAGNOSIS — I10 Essential (primary) hypertension: Secondary | ICD-10-CM | POA: Insufficient documentation

## 2023-01-08 DIAGNOSIS — R072 Precordial pain: Secondary | ICD-10-CM | POA: Insufficient documentation

## 2023-01-08 MED ORDER — METOPROLOL TARTRATE 50 MG PO TABS: 50.0000 mg | ORAL_TABLET | Freq: Once | ORAL | 0 refills | Status: DC

## 2023-01-08 NOTE — Patient Instructions (Signed)
Medication Instructions:  Your physician recommends that you continue on your current medications as directed. Please refer to the Current Medication list given to you today.  *If you need a refill on your cardiac medications before your next appointment, please call your pharmacy*   Lab Work: Your physician recommends that you return for lab work in:   Labs 1 week before CT: BMP  If you have labs (blood work) drawn today and your tests are completely normal, you will receive your results only by: [place] (if you have MyChart) OR A paper copy in the mail If you have any lab test that is abnormal or we need to change your treatment, we will call you to review the results.   Testing/Procedures:   Your cardiac CT will be scheduled at one of the below locations:   [facility] [address] [place], [place] [address] ([address]) [place] [address] [place], [place] [address] [phone]  [facility] [place], [place] [address] [phone]  If scheduled at [facility], please arrive at the [facility] and Children's Entrance (Entrance C2) of [facility] 30 minutes prior to test start time. You can use the FREE valet parking offered at entrance C (encouraged to control the heart rate for the test)  Proceed to the [facility] Radiology Department (first floor) to check-in and test prep.  All radiology patients and guests should use entrance C2 at [facility], accessed from [facility], even though the hospital's physical address listed is [address]    If scheduled at [facility] or [facility], please arrive 15 mins early for check-in and test prep.   Please follow these instructions carefully (unless otherwise directed):  On the Night Before the Test: Be  sure to Drink plenty of water. Do not consume any caffeinated/decaffeinated beverages or chocolate 12 hours prior to your test. Do not take any antihistamines 12 hours prior to your test.  On the Day of the Test: Drink plenty of water until 1 hour prior to the test. Do not eat any food 1 hour prior to test. You may take your regular medications prior to the test.  Take metoprolol (Lopressor) two hours prior to test. FEMALES- please wear underwire-free bra if available, avoid dresses & tight clothing  After the Test: Drink plenty of water. After receiving IV contrast, you may experience a mild flushed feeling. This is normal. On occasion, you may experience a mild rash up to 24 hours after the test. This is not dangerous. If this occurs, you can take Benadryl 25 mg and increase your fluid intake. If you experience trouble breathing, this can be serious. If it is severe call 911 IMMEDIATELY. If it is mild, please call our office. If you take any of these medications: Glipizide/Metformin, Avandament, Glucavance, please do not take 48 hours after completing test unless otherwise instructed.  We will call to schedule your test 2-4 weeks out understanding that some insurance companies will need an authorization prior to the service being performed.   For non-scheduling related questions, please contact the cardiac imaging nurse navigator should you have any questions/concerns: Marchia Bond, Cardiac Imaging Nurse Navigator Gordy Clement, Cardiac Imaging Nurse Navigator [facility] Direct Office Dial: [phone]   For scheduling needs, including cancellations and rescheduling, please call [place], [phone].  Follow-Up: At [facility], you and your health needs are our priority.  As part of our continuing mission to provide you with exceptional heart care, we have created designated Provider Care Teams.  These Care Teams include your primary  Cardiologist (physician) and Advanced Practice Providers (APPs -  Physician Assistants and Nurse Practitioners) who all work together to provide you with the care you need, when you need it.  We recommend signing up for the patient portal called "MyChart".  Sign up information is provided on this After Visit Summary.  MyChart is used to connect with patients for Virtual Visits (Telemedicine).  Patients are able to view lab/test results, encounter notes, upcoming appointments, etc.  Non-urgent messages can be sent to your provider as well.   To learn more about what you can do with MyChart, go to [email or web address].    Your next appointment:   6 month(s)  Provider:   Shirlee More, MD    Other Instructions None  This visit was accompanied by Glade Lloyd

## 2023-01-08 NOTE — Addendum Note (Signed)
Addended by: Edwyna Shell I on: 01/08/2023 11:42 AM   Modules accepted: Orders

## 2023-01-08 NOTE — Progress Notes (Signed)
Cardiology Office Note:    Date:  01/08/2023   ID:  Melissa Rodgers, DOB 1950/12/02, MRN [id number]  PCP:  Samuel Bouche, NP  Cardiologist:  Shirlee More, MD    Referring MD: Samuel Bouche, NP    ASSESSMENT:    1. Precordial chest pain   2. PAF (paroxysmal atrial fibrillation) ([place])   3. High risk medication use   4. Chronic anticoagulation   5. Essential hypertension    PLAN:    In order of problems listed above:  I explained to her that the hospitalization ED evaluation showed she did not have acute coronary syndrome aortopathy no evidence of cardiac injury.  Unfortunately did not give her a diagnosis.  I suspect that she has is chronic costochondral pain syndrome however she is at risk for CAD and I think with ongoing symptoms she should undergo cardiac CTA she agrees I would not do an evaluation for pulmonary embolism D-dimer is nonspecific and she is fully anticoagulated Doing well with atrial fibrillation continue flecainide and her current anticoagulant Well-controlled hypertension at target continue beta-blocker   Next appointment: 6 months   Medication Adjustments/Labs and Tests Ordered: Current medicines are reviewed at length with the patient today.  Concerns regarding medicines are outlined above.  No orders of the defined types were placed in this encounter.  No orders of the defined types were placed in this encounter.   Chief complaint I wanted to see you after hospitalization with chest pain   History of Present Illness:    Melissa Rodgers is a 72 y.o. female with a hx of paroxysmal atrial fibrillation maintaining sinus rhythm on flecainide and anticoagulated and hypertension last seen 03/24/2022.  She was released and admitted to [facility] 10/13/2022 and discharged the next day.  Presentation describes left-sided chest pain radiating to the left shoulder described as soreness constant pressure.  Her high-sensitivity troponin was normal echocardiogram  showed normal left ventricular function EKG did not show ischemic changes there is a discussion with cardiology about evaluation however she was not seen in consultation.  She had a chest abdomen pelvis CT performed CTA performed showing no evidence of aortopathy.  Her EKG 10/15/2022 independently reviewed showed sinus rhythm QS in V1 to V3 consider anteroseptal MI.  Today she went to the hospital she was having localized chest pain soreness sharp has had a before varying locations right and left costochondral junction and that it was worse when she took a deep breath she is fully anticoagulated.  She had no associated respiratory symptoms of cough did not have fever or chills and was not wheezing.  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   Dermal nevus of left forearm 04/17/2016   Diverticulitis of sigmoid colon 08/01/2019   Dry eye syndrome of both eyes 09/09/2018   Dry skin 04/17/2016  Dysrhythmia    Afib   Essential hypertension 09/09/2018   Fibromyalgia    Gastroesophageal reflux disease without esophagitis 11/06/2015   Generalized anxiety disorder with panic attacks 08/01/2019   History of palpitations 09/09/2018   Hyperlipidemia 09/09/2018   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   Knee  pain, acute 03/05/2020   Left knee DJD 03/27/2014   Major depressive disorder 09/09/2018   Microscopic hematuria 09/09/2018   Multiple benign nevi 04/17/2016   Non-seasonal allergic rhinitis 09/09/2018   Osteoarthritis    Palpitations 09/28/2013   Formatting of this note is different from the original. 01/29/15:  24 Hour Holter Monitor CONCLUSIONS: 1. Overall normal 24-hour Holter monitor recording, with rare, brief,  asymptomatic nonsustained supraventricular tachycardia as described.  2. No cardiac rhythm explanation for patient's symptoms.       Primary osteoarthritis of both knees 10/07/2016   Formatting of this note might be different from the original. 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   Primary osteoarthritis of right knee 11/21/2014   S/P laparoscopic fundoplication 0000000   Scar condition and fibrosis of skin 04/17/2016   Formatting of this note might be different from the original. lower abdomen   Sebaceous hyperplasia 04/17/2016   Seborrheic keratoses 04/17/2016   Skin tags, multiple acquired 04/17/2016   Solar lentigo 04/17/2016   Subarachnoid hemorrhage following injury [facility]) 2010   Fall from horse; brief hospitalization    Past Surgical History:  Procedure Laterality Date   COLONOSCOPY  10/04/2019   Dr. Esaw Dace, [facility]. polyp 2 mm in the sigmoid colon, ( Polypectomy). Moderate diverticulosis of the descending colon and sigmoid colon. Lipoma in the ascending colon. Internal hemorrhoids.   ESOPHAGOGASTRODUODENOSCOPY     possibly last one was 2017. In [place]   HERNIA REPAIR     MEDIAL PARTIAL KNEE REPLACEMENT Right 0000000   NISSEN FUNDOPLICATION  [id number]   OOPHORECTOMY     PARTIAL KNEE ARTHROPLASTY Left 04/05/2014   TOTAL KNEE REVISION Left 05/10/2021   Procedure: TOTAL KNEE REVISION;  Surgeon: Dorna Leitz, MD;  Location: [facility];  Service: Orthopedics;  Laterality: Left;    Current Medications: Current  Meds  Medication Sig   Acetaminophen (TYLENOL PO) Take 2 tablets by mouth daily as needed (pain).   amoxicillin (AMOXIL) 500 MG tablet Take 2,000 mg by mouth See admin instructions. Take 2000 mg by mouth 1 hour before dental appointment   bismuth subsalicylate (PEPTO BISMOL) 262 MG/15ML suspension Take 15 mLs by mouth as needed for indigestion.   calcium carbonate (TUMS - DOSED IN MG ELEMENTAL CALCIUM) 500 MG chewable tablet Chew 1,000 mg by mouth as needed for indigestion or heartburn.   cetirizine (ZYRTEC) 10 MG tablet Take 10 mg by mouth at bedtime.   clonazePAM (KLONOPIN) 0.5 MG tablet Take 0.5 tablets (0.25 mg total) by mouth daily as needed for anxiety.   Cyanocobalamin (B-12 PO) Take 1 tablet by mouth 2 (two) times a week.   DIGESTIVE ENZYMES PO Take 1 capsule by mouth in the morning and at bedtime.   docusate sodium (COLACE) 100 MG capsule Take 100 mg by mouth daily.   ELIQUIS 5 MG TABS tablet TAKE 1 TABLET TWICE A DAY (Patient taking differently: Take 5 mg by mouth 2 (two) times daily.)   flecainide (TAMBOCOR) 50 MG tablet TAKE 1 TABLET(50 MG) BY MOUTH TWICE DAILY (Patient taking differently: Take 50 mg by mouth 2 (two) times daily.)   FLUoxetine (PROZAC) 20 MG tablet TAKE 1 TABLET(20  MG) BY MOUTH DAILY   HYDROcodone-acetaminophen (NORCO/VICODIN) 5-325 MG tablet Take 1 tablet by mouth every 8 (eight) hours as needed for moderate pain.   metoprolol succinate (TOPROL XL) 25 MG 24 hr tablet Take 0.5 tablets (12.5 mg total) by mouth daily. (Patient taking differently: Take 12.5 mg by mouth every evening.)   Polyethyl Glycol-Propyl Glycol (SYSTANE OP) Place 1 drop into both eyes in the morning and at bedtime.   ZINC OXIDE, TOPICAL, EX Apply 1 Application topically as needed (rash under stomach).     Allergies:   Rosuvastatin, Sulfa antibiotics, and Latex   Social History   Socioeconomic History   Marital status: Married    Spouse name: Kirtland Bouchard   Number of children: 0   Years of  education: 18   Highest education level: Master's degree (e.g., MA, MS, MEng, MEd, MSW, MBA)  Occupational History   Occupation: Retired  Tobacco Use   Smoking status: Former    Types: Cigarettes    Quit date: 08/18/1983    Years since quitting: 39.4    Passive exposure: Past   Smokeless tobacco: Never  Vaping Use   Vaping Use: Never used  Substance and Sexual Activity   Alcohol use: Yes    Alcohol/week: 7.0 - 14.0 standard drinks of alcohol    Types: 7 - 14 Glasses of wine per week    Comment: 1-2 drinks per night, usually wine   Drug use: Never   Sexual activity: Yes    Partners: Male    Birth control/protection: None  Other Topics Concern   Not on file  Social History Narrative   Lives with her husband. Stays active and has a lot of hobbies such as gardening, cooking, reading and doing crafts.   Social Determinants of Health   Financial Resource Strain: Low Risk  (04/16/2021)   Overall Financial Resource Strain (CARDIA)    Difficulty of Paying Living Expenses: Not hard at all  Food Insecurity: No Food Insecurity (10/13/2022)   Hunger Vital Sign    Worried About Running Out of Food in the Last Year: Never true    Ran Out of Food in the Last Year: Never true  Transportation Needs: No Transportation Needs (10/13/2022)   PRAPARE - Hydrologist (Medical): No    Lack of Transportation (Non-Medical): No  Physical Activity: Inactive (04/16/2021)   Exercise Vital Sign    Days of Exercise per Week: 0 days    Minutes of Exercise per Session: 0 min  Stress: No Stress Concern Present (04/16/2021)   [place]    Feeling of Stress : Not at all  Social Connections: Moderately Isolated (04/16/2021)   Social Connection and Isolation Panel [NHANES]    Frequency of Communication with Friends and Family: More than three times a week    Frequency of Social Gatherings with Friends and Family: Once  a week    Attends Religious Services: Never    Marine scientist or Organizations: No    Attends Music therapist: Never    Marital Status: Married     Family History: The patient's family history includes Alzheimer's disease in her father; Atrial fibrillation in her sister; Cancer in her father; Diabetes in her mother; Heart disease in her mother; Tremor in an other family member. There is no history of Colon cancer, Esophageal cancer, Stomach cancer, or Rectal cancer. ROS:   Please see the history of present  illness.    All other systems reviewed and are negative.  EKGs/Labs/Other Studies Reviewed:    The following studies were reviewed today:  EKG:  EKG ordered today and personally reviewed.  The ekg ordered today demonstrates sinus rhythm poor R wave progression otherwise normal EKG  Recent Labs: 10/13/2022: ALT 16; BUN 20; Creatinine, Ser 0.63; Hemoglobin 14.0; Platelets 281; Potassium 3.7; Sodium 138  Recent Lipid Panel    Component Value Date/Time   CHOL 226 (H) 12/18/2022 0839   TRIG 239 (H) 12/18/2022 0839   HDL 47 (L) 12/18/2022 0839   CHOLHDL 4.8 12/18/2022 0839   LDLCALC 140 (H) 12/18/2022 0839    Physical Exam:    VS:  BP 132/88 (BP Location: Right Arm, Patient Position: Sitting, Cuff Size: Normal)   Pulse 64   Ht 5' 3"$  (1.6 m)   Wt 171 lb 6.4 oz (77.7 kg)   SpO2 97%   BMI 30.36 kg/m     Wt Readings from Last 3 Encounters:  01/08/23 171 lb 6.4 oz (77.7 kg)  12/10/22 173 lb 0.6 oz (78.5 kg)  10/13/22 171 lb 8.3 oz (77.8 kg)     GEN:  Well nourished, well developed in no acute distress HEENT: Normal NECK: No JVD; No carotid bruits LYMPHATICS: No lymphadenopathy CARDIAC: RRR, no murmurs, rubs, gallops RESPIRATORY:  Clear to auscultation without rales, wheezing or rhonchi  ABDOMEN: Soft, non-tender, non-distended MUSCULOSKELETAL:  No edema; No deformity  SKIN: Warm and dry NEUROLOGIC:  Alert and oriented x 3 PSYCHIATRIC:  Normal  affect   Seen with Ronnell Freshwater patient axis advocate as chaperone     Signed, Shirlee More, MD  01/08/2023 11:24 AM    [place]

## 2023-01-09 DIAGNOSIS — M9901 Segmental and somatic dysfunction of cervical region: Secondary | ICD-10-CM | POA: Diagnosis not present

## 2023-01-09 DIAGNOSIS — M9902 Segmental and somatic dysfunction of thoracic region: Secondary | ICD-10-CM | POA: Diagnosis not present

## 2023-01-09 DIAGNOSIS — M546 Pain in thoracic spine: Secondary | ICD-10-CM | POA: Diagnosis not present

## 2023-01-09 DIAGNOSIS — M4723 Other spondylosis with radiculopathy, cervicothoracic region: Secondary | ICD-10-CM | POA: Diagnosis not present

## 2023-01-12 DIAGNOSIS — Z79899 Other long term (current) drug therapy: Secondary | ICD-10-CM | POA: Diagnosis not present

## 2023-01-12 DIAGNOSIS — I10 Essential (primary) hypertension: Secondary | ICD-10-CM | POA: Diagnosis not present

## 2023-01-12 DIAGNOSIS — Z7901 Long term (current) use of anticoagulants: Secondary | ICD-10-CM | POA: Diagnosis not present

## 2023-01-12 DIAGNOSIS — I480 Paroxysmal atrial fibrillation: Secondary | ICD-10-CM | POA: Diagnosis not present

## 2023-01-12 DIAGNOSIS — R072 Precordial pain: Secondary | ICD-10-CM | POA: Diagnosis not present

## 2023-01-13 LAB — BASIC METABOLIC PANEL
BUN/Creatinine Ratio: 32 — ABNORMAL HIGH (ref 12–28)
BUN: 18 mg/dL (ref 8–27)
CO2: 20 mmol/L (ref 20–29)
Calcium: 9.2 mg/dL (ref 8.7–10.3)
Chloride: 102 mmol/L (ref 96–106)
Creatinine, Ser: 0.56 mg/dL — ABNORMAL LOW (ref 0.57–1.00)
Glucose: 94 mg/dL (ref 70–99)
Potassium: 4 mmol/L (ref 3.5–5.2)
Sodium: 139 mmol/L (ref 134–144)
eGFR: 97 mL/min/{1.73_m2} (ref >59)

## 2023-01-16 ENCOUNTER — Telehealth (HOSPITAL_COMMUNITY): Payer: Self-pay | Admitting: Emergency Medicine

## 2023-01-16 ENCOUNTER — Ambulatory Visit (INDEPENDENT_AMBULATORY_CARE_PROVIDER_SITE_OTHER): Payer: Medicare Other | Admitting: Medical-Surgical

## 2023-01-16 DIAGNOSIS — Z0000 Encounter for general adult medical examination without abnormal findings: Secondary | ICD-10-CM

## 2023-01-16 NOTE — Progress Notes (Signed)
MEDICARE ANNUAL WELLNESS VISIT  01/16/2023  Telephone Visit Disclaimer This Medicare AWV was conducted by telephone due to national recommendations for restrictions regarding the COVID-19 Pandemic (e.g. social distancing).  I verified, using two identifiers, that I am speaking with Melissa Rodgers or their authorized healthcare agent. I discussed the limitations, risks, security, and privacy concerns of performing an evaluation and management service by telephone and the potential availability of an in-person appointment in the future. The patient expressed understanding and agreed to proceed.  Location of Patient: Home Location of Provider (nurse):  Provider home  Subjective:    Melissa Rodgers is a 72 y.o. female patient of Samuel Bouche, NP who had a Medicare Annual Wellness Visit today via telephone. Shakerra is Retired and lives with their spouse. she does not have any children. she reports that she is socially active and does interact with friends/family regularly. she is minimally physically active and enjoys gardening, cooking, sewing, reading, and doing crafts.  Patient Care Team: Samuel Bouche, NP as PCP - General (Nurse Practitioner) Richardo Priest, MD as PCP - Cardiology (Cardiology) Silverio Decamp, MD as Consulting Physician (Family Medicine)     01/16/2023    8:24 AM 10/13/2022   10:53 PM 05/29/2021    2:35 PM 05/10/2021    5:00 PM 04/29/2021   11:03 AM 04/16/2021   11:06 AM 07/07/2019    2:47 PM  Advanced Directives  Does Patient Have a Medical Advance Directive? No Yes No No No No Yes  Type of Advance Directive  Healthcare Power of [place]  Does patient want to make changes to medical advance directive?  No - Patient declined       Would patient like information on creating a medical advance directive? No - Patient declined  No - Patient declined No - Patient declined  No - Patient declined     Hospital Utilization Over the Past 12  Months: # of hospitalizations or ER visits: 0 # of surgeries: 1  Review of Systems    Patient reports that her overall health is unchanged compared to last year.  History obtained from chart review and the patient  Patient Reported Readings (BP, Pulse, CBG, Weight, etc) none  Pain Assessment Pain : 0-10 Pain Score: 5  Pain Type: Acute pain Pain Location: Abdomen Pain Descriptors / Indicators: Aching, Constant Pain Onset: In the past 7 days Pain Frequency: Intermittent Pain Relieving Factors: Tums, pepto bismol  Pain Relieving Factors: Tums, pepto bismol  Current Medications & Allergies (verified) Allergies as of 01/16/2023       Reactions   Rosuvastatin Other (See Comments)   Musculoskeletal Aches Muscle spasms   Sulfa Antibiotics Rash   Childhood reaction    Latex Rash        Medication List        Accurate as of January 16, 2023  8:41 AM. If you have any questions, ask your nurse or doctor.          amoxicillin 500 MG tablet Commonly known as: AMOXIL Take 2,000 mg by mouth See admin instructions. Take 2000 mg by mouth 1 hour before dental appointment   B-12 PO Take 1 tablet by mouth 2 (two) times a week.   bismuth subsalicylate [id number] suspension Commonly known as: PEPTO BISMOL Take 15 mLs by mouth as needed for indigestion.   calcium carbonate 500 MG chewable tablet Commonly known as: TUMS - dosed in mg elemental calcium  Chew 1,000 mg by mouth as needed for indigestion or heartburn.   cetirizine 10 MG tablet Commonly known as: ZYRTEC Take 10 mg by mouth at bedtime.   clonazePAM 0.5 MG tablet Commonly known as: KLONOPIN Take 0.5 tablets (0.25 mg total) by mouth daily as needed for anxiety.   DIGESTIVE ENZYMES PO Take 1 capsule by mouth in the morning and at bedtime.   docusate sodium 100 MG capsule Commonly known as: COLACE Take 100 mg by mouth daily.   Eliquis 5 MG Tabs tablet Generic drug: apixaban TAKE 1 TABLET TWICE A DAY What  changed: how much to take   flecainide 50 MG tablet Commonly known as: TAMBOCOR TAKE 1 TABLET(50 MG) BY MOUTH TWICE DAILY What changed: See the new instructions.   FLUoxetine 20 MG tablet Commonly known as: PROZAC TAKE 1 TABLET(20 MG) BY MOUTH DAILY   HYDROcodone-acetaminophen 5-325 MG tablet Commonly known as: NORCO/VICODIN Take 1 tablet by mouth every 8 (eight) hours as needed for moderate pain.   metoprolol succinate 25 MG 24 hr tablet Commonly known as: Toprol XL Take 0.5 tablets (12.5 mg total) by mouth daily. What changed: when to take this   metoprolol tartrate 50 MG tablet Commonly known as: LOPRESSOR Take 1 tablet (50 mg total) by mouth once for 1 dose. Please take this medication 2 hours before CT.   SYSTANE OP Place 1 drop into both eyes in the morning and at bedtime.   TYLENOL PO Take 2 tablets by mouth daily as needed (pain).   ZINC OXIDE (TOPICAL) EX Apply 1 Application topically as needed (rash under stomach).        History (reviewed): Past Medical History:  Diagnosis Date   Allergy    seasonal   Anemia    Atrial fibrillation 06/18/2019   BMI 30.0-30.9,adult 01/27/2013   Candidosis of skin 04/17/2016   Formatting of this note might be different from the original. 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 01/29/15:  24 Hour Holter Monitor CONCLUSIONS: 1. Overall normal 24-hour Holter monitor recording, with rare, brief,  asymptomatic nonsustained supraventricular tachycardia as described.  2. No cardiac rhythm explanation for patient's symptoms.       Primary osteoarthritis of both knees 10/07/2016   Formatting of this note might be different from the original. 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   Primary osteoarthritis of right knee 11/21/2014   S/P laparoscopic fundoplication 0000000   Scar condition and fibrosis of skin 04/17/2016   Formatting of this note might be different from  the original. lower abdomen   Sebaceous hyperplasia 04/17/2016   Seborrheic keratoses 04/17/2016   Skin tags, multiple acquired 04/17/2016   Solar lentigo 04/17/2016   Subarachnoid hemorrhage following injury [facility]) 2010   Fall from horse; brief hospitalization   Past Surgical History:  Procedure Laterality Date   COLONOSCOPY  10/04/2019   Dr. Esaw Dace, [facility]. polyp 2 mm in the sigmoid colon, ( Polypectomy). Moderate diverticulosis of the descending colon and sigmoid colon. Lipoma in the ascending colon. Internal hemorrhoids.   ESOPHAGOGASTRODUODENOSCOPY     possibly last one was 2017. In [place]   HERNIA REPAIR     JOINT REPLACEMENT     MEDIAL PARTIAL KNEE REPLACEMENT Right 0000000   NISSEN FUNDOPLICATION  [id number]   OOPHORECTOMY     PARTIAL KNEE ARTHROPLASTY Left 04/05/2014   TOTAL KNEE REVISION Left 05/10/2021   Procedure: TOTAL KNEE REVISION;  Surgeon: Dorna Leitz, MD;  Location: [facility];  Service: Orthopedics;  Laterality: Left;   Family  History  Problem Relation Age of Onset   Diabetes Mother    Heart disease Mother    Anxiety disorder Mother    Arthritis Mother    Depression Mother    Cancer Father        small places of skin cancer to face, unsure of type   Alzheimer's disease Father        Symptom onset in late 8s   Heart disease Father    Hypertension Father    Atrial fibrillation Sister    Diabetes Sister    Hypertension Sister    Tremor Other        Significant maternal history of tremor; unknown regarding Parkinson's disease diagnoses   Colon cancer Neg Hx    Esophageal cancer Neg Hx    Stomach cancer Neg Hx    Rectal cancer Neg Hx    Social History   Socioeconomic History   Marital status: Married    Spouse name: Kirtland Bouchard   Number of children: 0   Years of education: 18   Highest education level: Master's degree (e.g., MA, MS, MEng, MEd, MSW, MBA)  Occupational History   Occupation: Retired  Tobacco Use   Smoking status: Former    Packs/day: 0.25    Types: Cigarettes    Quit date: 08/18/1983    Years since quitting: 39.4    Passive exposure: Past   Smokeless tobacco: Never  Vaping Use   Vaping Use: Never used  Substance and Sexual Activity   Alcohol use: Yes    Alcohol/week: 1.0 - 3.0 standard drink of alcohol    Types: 1 - 3 Glasses of wine per week    Comment: 1-2 drinks a week, usually wine   Drug use: Never   Sexual activity: Not Currently    Partners: Male    Birth control/protection: None  Other Topics Concern   Not on file  Social History Narrative   Lives with her husband. Stays active and has a lot of hobbies such as gardening, cooking, reading and doing crafts.   Social Determinants of Health   Financial Resource Strain: Low Risk  (01/13/2023)   Overall Financial Resource Strain (CARDIA)    Difficulty of Paying Living Expenses: Not hard at all  Food Insecurity: No Food Insecurity (01/13/2023)   Hunger Vital Sign    Worried About Running Out of Food in the Last  Year: Never true    Ran Out of Food in the Last Year: Never true  Transportation  Needs: No Transportation Needs (01/13/2023)   PRAPARE - Hydrologist (Medical): No    Lack of Transportation (Non-Medical): No  Physical Activity: Insufficiently Active (01/13/2023)   Exercise Vital Sign    Days of Exercise per Week: 4 days    Minutes of Exercise per Session: 20 min  Stress: Stress Concern Present (01/13/2023)   Impact    Feeling of Stress : To some extent  Social Connections: Moderately Isolated (01/16/2023)   Social Connection and Isolation Panel [NHANES]    Frequency of Communication with Friends and Family: Twice a week    Frequency of Social Gatherings with Friends and Family: Once a week    Attends Religious Services: Never    Marine scientist or Organizations: No    Attends Archivist Meetings: Never    Marital Status: Married    Activities of Daily Living    01/13/2023    8:33 AM 10/13/2022   10:55 PM  In your present state of health, do you have any difficulty performing the following activities:  Hearing? 0   Vision? 0   Difficulty concentrating or making decisions? 0   Walking or climbing stairs? 0   Dressing or bathing? 0   Doing errands, shopping? 0 1  Preparing Food and eating ? N   Using the Toilet? N   In the past six months, have you accidently leaked urine? Y   Do you have problems with loss of bowel control? N   Managing your Medications? N   Managing your Finances? N   Housekeeping or managing your Housekeeping? N     Patient Education/ Literacy How often do you need to have someone help you when you read instructions, pamphlets, or other written materials from your doctor or pharmacy?: 1 - Never What is the last grade level you completed in school?: Masters degree  Exercise Current Exercise Habits: Home exercise routine, Type of exercise:  walking, Time (Minutes): 20, Frequency (Times/Week): 4, Weekly Exercise (Minutes/Week): 80, Intensity: Moderate, Exercise limited by: orthopedic condition(s)  Diet Patient reports consuming 3 meals a day and 2 snack(s) a day Patient reports that her primary diet is: Regular Patient reports that she does have regular access to food.   Depression Screen    01/16/2023    8:26 AM 12/10/2022   11:42 AM 06/09/2022    3:34 PM 03/07/2022   10:09 AM 12/06/2021    1:08 PM 09/26/2021   11:37 AM 07/15/2021    2:21 PM  PHQ 2/9 Scores  PHQ - 2 Score 0 0 0 0 0 0 0  PHQ- 9 Score  '3 4   1      '$ Fall Risk    01/16/2023    8:26 AM 01/13/2023    8:33 AM 12/10/2022   11:42 AM 06/09/2022    3:34 PM 03/07/2022   10:09 AM  Fall Risk   Falls in the past year? 0 0 0 1 0  Number falls in past yr: 0 0 0 0 0  Injury with Fall? 0 0 0 0 0  Risk for fall due to : No Fall Risks  No Fall Risks History of fall(s) No Fall Risks  Follow up Falls evaluation completed  Falls evaluation completed Falls evaluation completed Falls evaluation completed     Objective:  Aeralynn Monce seemed alert and oriented and she participated appropriately during our telephone visit.  Blood Pressure Weight  BMI  BP Readings from Last 3 Encounters:  01/08/23 132/88  12/10/22 112/70  10/14/22 128/73   Wt Readings from Last 3 Encounters:  01/08/23 171 lb 6.4 oz (77.7 kg)  12/10/22 173 lb 0.6 oz (78.5 kg)  10/13/22 171 lb 8.3 oz (77.8 kg)   BMI Readings from Last 1 Encounters:  01/08/23 30.36 kg/m    *Unable to obtain current vital signs, weight, and BMI due to telephone visit type  Hearing/Vision  Hayli did not seem to have difficulty with hearing/understanding during the telephone conversation Reports that she has not had a formal eye exam by an eye care professional within the past year Reports that she has not had a formal hearing evaluation within the past year *Unable to fully assess hearing and vision during telephone visit  type  Cognitive Function:    01/16/2023    8:33 AM 04/16/2021   11:16 AM  6CIT Screen  What Year? 0 points 0 points  What month? 0 points 0 points  What time? 0 points 0 points  Count back from 20 0 points 0 points  Months in reverse 0 points 0 points  Repeat phrase 0 points 0 points  Total Score 0 points 0 points   (Normal:0-7, Significant for Dysfunction: >8)  Normal Cognitive Function Screening: Yes   Immunization & Health Maintenance Record Immunization History  Administered Date(s) Administered   Fluad Quad(high Dose 65+) 08/29/2019   Influenza Whole 09/13/2014   Influenza, High Dose Seasonal PF 10/07/2016, 09/15/2017, 09/09/2018   Influenza,inj,Quad PF,6+ Mos 09/26/2015   Influenza,inj,quad, With Preservative 09/09/2013   Influenza-Unspecified 09/06/2020, 08/14/2022   Moderna SARS-COV2 Booster Vaccination 07/31/2021   PFIZER(Purple Top)SARS-COV-2 Vaccination 12/16/2019, 01/05/2020, 08/23/2020, 03/18/2021   Pfizer Covid-19 Vaccine Bivalent Booster 41yr & up 07/31/2021   Pneumococcal Conjugate-13 08/10/2015   Pneumococcal Polysaccharide-23 10/14/2013   Rsv, Bivalent, Protein Subunit Rsvpref,pf (Evans Lance 11/27/2022   Tdap 03/20/2015   Zoster, Live 01/27/2013    Health Maintenance  Topic Date Due   Zoster Vaccines- Shingrix (1 of 2) 01/17/2023 (Originally 06/21/1970)   COVID-19 Vaccine (5 - 2023-24 season) 02/01/2023 (Originally 07/25/2022)   Pneumonia Vaccine 73 Years old (314of 3 - PPSV23 or PCV20) 07/12/2023 (Originally 08/09/2020)   Hepatitis C Screening  07/12/2023 (Originally 06/21/1969)   Medicare Annual Wellness (AWV)  01/17/2024   MAMMOGRAM  08/20/2024   COLONOSCOPY (Pts 45-420yrInsurance coverage will need to be confirmed)  10/03/2024   DTaP/Tdap/Td (2 - Td or Tdap) 03/19/2025   INFLUENZA VACCINE  Completed   DEXA SCAN  Completed   HPV VACCINES  Aged Out       Assessment  This is a routine wellness examination for [employer] Health Maintenance:  Due or Overdue There are no preventive care reminders to display for this patient.   Ca[facility] not need a referral for Community Assistance: Care Management:   no Social Work:    no Prescription Assistance:  no Nutrition/Diabetes Education:  no   Plan:  Personalized Goals  Goals Addressed               This Visit's Progress     Patient Stated (pt-stated)        01/16/2023 AWV Goal: Exercise for General Health  Patient will verbalize understanding of the benefits of increased physical activity: Exercising regularly is important. It will improve your overall fitness, flexibility, and endurance. Regular exercise also will improve your overall health. It can help you control your weight, reduce stress, and improve  your bone density. Over the next year, patient will increase physical activity as tolerated with a goal of at least 150 minutes of moderate physical activity per week.  You can tell that you are exercising at a moderate intensity if your heart starts beating faster and you start breathing faster but can still hold a conversation. Moderate-intensity exercise ideas include: Walking 1 mile (1.6 km) in about 15 minutes Biking Hiking Golfing Dancing Water aerobics Patient will verbalize understanding of everyday activities that increase physical activity by providing examples like the following: Yard work, such as: Sales promotion account executive Gardening Washing windows or floors Patient will be able to explain general safety guidelines for exercising:  Before you start a new exercise program, talk with your health care provider. Do not exercise so much that you hurt yourself, feel dizzy, or get very short of breath. Wear comfortable clothes and wear shoes with good support. Drink plenty of water while you exercise to prevent dehydration or heat stroke. Work out until your breathing and  your heartbeat get faster.        Personalized Health Maintenance & Screening Recommendations  Pneumococcal vaccine  Shingrix vaccine Mammogram- due in September  Lung Cancer Screening Recommended: no (Low Dose CT Chest recommended if Age 64-80 years, 30 pack-year currently smoking OR have quit w/in past 15 years) Hepatitis C Screening recommended: yes HIV Screening recommended: no  Advanced Directives: Written information was not prepared per patient's request.  Referrals & Orders No orders of the defined types were placed in this encounter.   Follow-up Plan Follow-up with Samuel Bouche, NP as planned Schedule shingles vaccine at the pharmacy.  Discuss pneumonia vaccine and bone density scan with pcp at next appointment. Medicare wellness in one year. Patient will access AVS on my chart.   I have personally reviewed and noted the following in the patient's chart:   Medical and social history Use of alcohol, tobacco or illicit drugs  Current medications and supplements Functional ability and status Nutritional status Physical activity Advanced directives List of other physicians Hospitalizations, surgeries, and ER visits in previous 12 months Vitals Screenings to include cognitive, depression, and falls Referrals and appointments  In addition, I have reviewed and discussed with Melissa Rodgers certain preventive protocols, quality metrics, and best practice recommendations. A written personalized care plan for preventive services as well as general preventive health recommendations is available and can be mailed to the patient at her request.      Tinnie Gens, RN BSN  01/16/2023

## 2023-01-16 NOTE — Telephone Encounter (Signed)
Reaching out to patient to offer assistance regarding upcoming cardiac imaging study; pt verbalizes understanding of appt date/time, parking situation and where to check in, pre-test NPO status and medications ordered, and verified current allergies; name and call back number provided for further questions should they arise Melissa Bond RN Navigator Cardiac Imaging [facility] [phone] office [phone] cell  Arrival [address] entrance '50mg'$  metoprolol tartrate  Denies iv issues Aware contrast/nitro

## 2023-01-16 NOTE — Patient Instructions (Signed)
[facility] Maintenance Summary and Written Plan of Care  Melissa Rodgers ,  Thank you for allowing me to perform your Medicare Annual Wellness Visit and for your ongoing commitment to your health.   Health Maintenance & Immunization History Health Maintenance  Topic Date Due   Zoster Vaccines- Shingrix (1 of 2) 01/17/2023 (Originally 06/21/1970)   COVID-19 Vaccine (5 - 2023-24 season) 02/01/2023 (Originally 07/25/2022)   Pneumonia Vaccine 39+ Years old (11 of 3 - PPSV23 or PCV20) 07/12/2023 (Originally 08/09/2020)   Hepatitis C Screening  07/12/2023 (Originally 06/21/1969)   Medicare Annual Wellness (AWV)  01/17/2024   MAMMOGRAM  08/20/2024   COLONOSCOPY (Pts 45-78yr Insurance coverage will need to be confirmed)  10/03/2024   DTaP/Tdap/Td (2 - Td or Tdap) 03/19/2025   INFLUENZA VACCINE  Completed   DEXA SCAN  Completed   HPV VACCINES  Aged Out   Immunization History  Administered Date(s) Administered   Fluad Quad(high Dose 65+) 08/29/2019   Influenza Whole 09/13/2014   Influenza, High Dose Seasonal PF 10/07/2016, 09/15/2017, 09/09/2018   Influenza,inj,Quad PF,6+ Mos 09/26/2015   Influenza,inj,quad, With Preservative 09/09/2013   Influenza-Unspecified 09/06/2020, 08/14/2022   Moderna SARS-COV2 Booster Vaccination 07/31/2021   PFIZER(Purple Top)SARS-COV-2 Vaccination 12/16/2019, 01/05/2020, 08/23/2020, 03/18/2021   Pfizer Covid-19 Vaccine Bivalent Booster 155yr& up 07/31/2021   Pneumococcal Conjugate-13 08/10/2015   Pneumococcal Polysaccharide-23 10/14/2013   Rsv, Bivalent, Protein Subunit Rsvpref,pf (AEvans Lance01/02/2023   Tdap 03/20/2015   Zoster, Live 01/27/2013    These are the patient goals that we discussed:  Goals Addressed               This Visit's Progress     Patient Stated (pt-stated)        01/16/2023 AWV Goal: Exercise for General Health  Patient will verbalize understanding of the benefits of increased physical  activity: Exercising regularly is important. It will improve your overall fitness, flexibility, and endurance. Regular exercise also will improve your overall health. It can help you control your weight, reduce stress, and improve your bone density. Over the next year, patient will increase physical activity as tolerated with a goal of at least 150 minutes of moderate physical activity per week.  You can tell that you are exercising at a moderate intensity if your heart starts beating faster and you start breathing faster but can still hold a conversation. Moderate-intensity exercise ideas include: Walking 1 mile (1.6 km) in about 15 minutes Biking Hiking Golfing Dancing Water aerobics Patient will verbalize understanding of everyday activities that increase physical activity by providing examples like the following: Yard work, such as: PuSales promotion account executiveardening Washing windows or floors Patient will be able to explain general safety guidelines for exercising:  Before you start a new exercise program, talk with your health care provider. Do not exercise so much that you hurt yourself, feel dizzy, or get very short of breath. Wear comfortable clothes and wear shoes with good support. Drink plenty of water while you exercise to prevent dehydration or heat stroke. Work out until your breathing and your heartbeat get faster.          This is a list of Health Maintenance Items that are overdue or due now: Pneumococcal vaccine  Shingrix vaccine Mammogram- due in September    Orders/Referrals Placed Today: No orders of the defined types were placed in this encounter.  (Contact our referral department at [phone]  if you have not spoken with someone about your referral appointment within the next 5 days)    Follow-up Plan Follow-up with Melissa Bouche, Melissa Rodgers as planned Schedule shingles vaccine at the  pharmacy.  Discuss pneumonia vaccine and bone density scan with pcp at next appointment. Medicare wellness in one year. Patient will access AVS on my chart.      Health Maintenance, Female Adopting a healthy lifestyle and getting preventive care are important in promoting health and wellness. Ask your health care provider about: The right schedule for you to have regular tests and exams. Things you can do on your own to prevent diseases and keep yourself healthy. What should I know about diet, weight, and exercise? Eat a healthy diet  Eat a diet that includes plenty of vegetables, fruits, low-fat dairy products, and lean protein. Do not eat a lot of foods that are high in solid fats, added sugars, or sodium. Maintain a healthy weight Body mass index (BMI) is used to identify weight problems. It estimates body fat based on height and weight. Your health care provider can help determine your BMI and help you achieve or maintain a healthy weight. Get regular exercise Get regular exercise. This is one of the most important things you can do for your health. Most adults should: Exercise for at least 150 minutes each week. The exercise should increase your heart rate and make you sweat (moderate-intensity exercise). Do strengthening exercises at least twice a week. This is in addition to the moderate-intensity exercise. Spend less time sitting. Even light physical activity can be beneficial. Watch cholesterol and blood lipids Have your blood tested for lipids and cholesterol at 73 years of age, then have this test every 5 years. Have your cholesterol levels checked more often if: Your lipid or cholesterol levels are high. You are older than 73 years of age. You are at high risk for heart disease. What should I know about cancer screening? Depending on your health history and family history, you may need to have cancer screening at various ages. This may include screening for: Breast  cancer. Cervical cancer. Colorectal cancer. Skin cancer. Lung cancer. What should I know about heart disease, diabetes, and high blood pressure? Blood pressure and heart disease High blood pressure causes heart disease and increases the risk of stroke. This is more likely to develop in people who have high blood pressure readings or are overweight. Have your blood pressure checked: Every 3-5 years if you are 98-8 years of age. Every year if you are 55 years old or older. Diabetes Have regular diabetes screenings. This checks your fasting blood sugar level. Have the screening done: Once every three years after age 65 if you are at a normal weight and have a low risk for diabetes. More often and at a younger age if you are overweight or have a high risk for diabetes. What should I know about preventing infection? Hepatitis B If you have a higher risk for hepatitis B, you should be screened for this virus. Talk with your health care provider to find out if you are at risk for hepatitis B infection. Hepatitis C Testing is recommended for: Everyone born from [birth year] through 1966. Anyone with known risk factors for hepatitis C. Sexually transmitted infections (STIs) Get screened for STIs, including gonorrhea and chlamydia, if: You are sexually active and are younger than 73 years of age. You are older than 73 years of age and your health care provider tells you that you  are at risk for this type of infection. Your sexual activity has changed since you were last screened, and you are at increased risk for chlamydia or gonorrhea. Ask your health care provider if you are at risk. Ask your health care provider about whether you are at high risk for HIV. Your health care provider may recommend a prescription medicine to help prevent HIV infection. If you choose to take medicine to prevent HIV, you should first get tested for HIV. You should then be tested every 3 months for as long as you are taking  the medicine. Pregnancy If you are about to stop having your period (premenopausal) and you may become pregnant, seek counseling before you get pregnant. Take 400 to 800 micrograms (mcg) of folic acid every day if you become pregnant. Ask for birth control (contraception) if you want to prevent pregnancy. Osteoporosis and menopause Osteoporosis is a disease in which the bones lose minerals and strength with aging. This can result in bone fractures. If you are 63 years old or older, or if you are at risk for osteoporosis and fractures, ask your health care provider if you should: Be screened for bone loss. Take a calcium or vitamin D supplement to lower your risk of fractures. Be given hormone replacement therapy (HRT) to treat symptoms of menopause. Follow these instructions at home: Alcohol use Do not drink alcohol if: Your health care provider tells you not to drink. You are pregnant, may be pregnant, or are planning to become pregnant. If you drink alcohol: Limit how much you have to: 0-1 drink a day. Know how much alcohol is in your drink. In the [place] one drink equals one 12 oz bottle of beer (355 mL), one 5 oz glass of wine (148 mL), or one 1 oz glass of hard liquor (44 mL). Lifestyle Do not use any products that contain nicotine or tobacco. These products include cigarettes, chewing tobacco, and vaping devices, such as e-cigarettes. If you need help quitting, ask your health care provider. Do not use street drugs. Do not share needles. Ask your health care provider for help if you need support or information about quitting drugs. General instructions Schedule regular health, dental, and eye exams. Stay current with your vaccines. Tell your health care provider if: You often feel depressed. You have ever been abused or do not feel safe at home. Summary Adopting a healthy lifestyle and getting preventive care are important in promoting health and wellness. Follow your health  care provider's instructions about healthy diet, exercising, and getting tested or screened for diseases. Follow your health care provider's instructions on monitoring your cholesterol and blood pressure. This information is not intended to replace advice given to you by your health care provider. Make sure you discuss any questions you have with your health care provider. Document Revised: 04/01/2021 Document Reviewed: 04/01/2021 Elsevier Patient Education  [place].

## 2023-01-19 ENCOUNTER — Ambulatory Visit (HOSPITAL_COMMUNITY)
Admission: RE | Admit: 2023-01-19 | Discharge: 2023-01-19 | Disposition: A | Discharge status: Home / Self Care | Payer: Medicare Other | Source: Ambulatory Visit | Attending: Cardiology | Admitting: Cardiology

## 2023-01-19 DIAGNOSIS — R072 Precordial pain: Secondary | ICD-10-CM | POA: Diagnosis not present

## 2023-01-19 MED ORDER — NITROGLYCERIN 0.4 MG SL SUBL
0.8000 mg | SUBLINGUAL_TABLET | Freq: Once | SUBLINGUAL | Status: AC
  Administered 2023-01-19: 0.8 mg via SUBLINGUAL

## 2023-01-19 MED ORDER — NITROGLYCERIN 0.4 MG SL SUBL
SUBLINGUAL_TABLET | SUBLINGUAL | Status: AC
  Filled 2023-01-19: qty 2

## 2023-01-19 MED ORDER — IOHEXOL 350 MG/ML SOLN
95.0000 mL | Freq: Once | INTRAVENOUS | Status: AC | PRN
  Administered 2023-01-19: 95 mL via INTRAVENOUS

## 2023-01-20 DIAGNOSIS — M9902 Segmental and somatic dysfunction of thoracic region: Secondary | ICD-10-CM | POA: Diagnosis not present

## 2023-01-20 DIAGNOSIS — M546 Pain in thoracic spine: Secondary | ICD-10-CM | POA: Diagnosis not present

## 2023-01-20 DIAGNOSIS — M4723 Other spondylosis with radiculopathy, cervicothoracic region: Secondary | ICD-10-CM | POA: Diagnosis not present

## 2023-01-20 DIAGNOSIS — M9901 Segmental and somatic dysfunction of cervical region: Secondary | ICD-10-CM | POA: Diagnosis not present

## 2023-01-23 DIAGNOSIS — M4723 Other spondylosis with radiculopathy, cervicothoracic region: Secondary | ICD-10-CM | POA: Diagnosis not present

## 2023-01-23 DIAGNOSIS — M9902 Segmental and somatic dysfunction of thoracic region: Secondary | ICD-10-CM | POA: Diagnosis not present

## 2023-01-23 DIAGNOSIS — M546 Pain in thoracic spine: Secondary | ICD-10-CM | POA: Diagnosis not present

## 2023-01-23 DIAGNOSIS — M9901 Segmental and somatic dysfunction of cervical region: Secondary | ICD-10-CM | POA: Diagnosis not present

## 2023-01-24 ENCOUNTER — Other Ambulatory Visit: Payer: Self-pay | Admitting: Cardiology

## 2023-01-27 DIAGNOSIS — M9902 Segmental and somatic dysfunction of thoracic region: Secondary | ICD-10-CM | POA: Diagnosis not present

## 2023-01-27 DIAGNOSIS — M4723 Other spondylosis with radiculopathy, cervicothoracic region: Secondary | ICD-10-CM | POA: Diagnosis not present

## 2023-01-27 DIAGNOSIS — M546 Pain in thoracic spine: Secondary | ICD-10-CM | POA: Diagnosis not present

## 2023-01-27 DIAGNOSIS — M9901 Segmental and somatic dysfunction of cervical region: Secondary | ICD-10-CM | POA: Diagnosis not present

## 2023-01-30 ENCOUNTER — Other Ambulatory Visit: Payer: Self-pay | Admitting: Medical-Surgical

## 2023-01-30 DIAGNOSIS — M4723 Other spondylosis with radiculopathy, cervicothoracic region: Secondary | ICD-10-CM | POA: Diagnosis not present

## 2023-01-30 DIAGNOSIS — M546 Pain in thoracic spine: Secondary | ICD-10-CM | POA: Diagnosis not present

## 2023-01-30 DIAGNOSIS — M9901 Segmental and somatic dysfunction of cervical region: Secondary | ICD-10-CM | POA: Diagnosis not present

## 2023-01-30 DIAGNOSIS — M9902 Segmental and somatic dysfunction of thoracic region: Secondary | ICD-10-CM | POA: Diagnosis not present

## 2023-02-02 DIAGNOSIS — N200 Calculus of kidney: Secondary | ICD-10-CM | POA: Diagnosis not present

## 2023-02-02 DIAGNOSIS — K219 Gastro-esophageal reflux disease without esophagitis: Secondary | ICD-10-CM | POA: Diagnosis not present

## 2023-02-02 DIAGNOSIS — K3189 Other diseases of stomach and duodenum: Secondary | ICD-10-CM | POA: Diagnosis not present

## 2023-02-02 DIAGNOSIS — F419 Anxiety disorder, unspecified: Secondary | ICD-10-CM | POA: Diagnosis not present

## 2023-02-02 DIAGNOSIS — R110 Nausea: Secondary | ICD-10-CM | POA: Diagnosis not present

## 2023-02-02 DIAGNOSIS — Z882 Allergy status to sulfonamides status: Secondary | ICD-10-CM | POA: Diagnosis not present

## 2023-02-02 DIAGNOSIS — Z791 Long term (current) use of non-steroidal anti-inflammatories (NSAID): Secondary | ICD-10-CM | POA: Diagnosis not present

## 2023-02-02 DIAGNOSIS — R1111 Vomiting without nausea: Secondary | ICD-10-CM | POA: Diagnosis not present

## 2023-02-02 DIAGNOSIS — Z888 Allergy status to other drugs, medicaments and biological substances status: Secondary | ICD-10-CM | POA: Diagnosis not present

## 2023-02-02 DIAGNOSIS — M797 Fibromyalgia: Secondary | ICD-10-CM | POA: Diagnosis not present

## 2023-02-02 DIAGNOSIS — K6389 Other specified diseases of intestine: Secondary | ICD-10-CM | POA: Diagnosis not present

## 2023-02-02 DIAGNOSIS — K529 Noninfective gastroenteritis and colitis, unspecified: Secondary | ICD-10-CM | POA: Diagnosis not present

## 2023-02-02 DIAGNOSIS — I10 Essential (primary) hypertension: Secondary | ICD-10-CM | POA: Diagnosis not present

## 2023-02-02 DIAGNOSIS — Z79899 Other long term (current) drug therapy: Secondary | ICD-10-CM | POA: Diagnosis not present

## 2023-02-02 DIAGNOSIS — I4891 Unspecified atrial fibrillation: Secondary | ICD-10-CM | POA: Diagnosis not present

## 2023-02-02 DIAGNOSIS — Z7901 Long term (current) use of anticoagulants: Secondary | ICD-10-CM | POA: Diagnosis not present

## 2023-02-06 DIAGNOSIS — M4723 Other spondylosis with radiculopathy, cervicothoracic region: Secondary | ICD-10-CM | POA: Diagnosis not present

## 2023-02-06 DIAGNOSIS — M546 Pain in thoracic spine: Secondary | ICD-10-CM | POA: Diagnosis not present

## 2023-02-06 DIAGNOSIS — M9901 Segmental and somatic dysfunction of cervical region: Secondary | ICD-10-CM | POA: Diagnosis not present

## 2023-02-06 DIAGNOSIS — M9902 Segmental and somatic dysfunction of thoracic region: Secondary | ICD-10-CM | POA: Diagnosis not present

## 2023-02-11 DIAGNOSIS — M4723 Other spondylosis with radiculopathy, cervicothoracic region: Secondary | ICD-10-CM | POA: Diagnosis not present

## 2023-02-11 DIAGNOSIS — M546 Pain in thoracic spine: Secondary | ICD-10-CM | POA: Diagnosis not present

## 2023-02-11 DIAGNOSIS — M9901 Segmental and somatic dysfunction of cervical region: Secondary | ICD-10-CM | POA: Diagnosis not present

## 2023-02-11 DIAGNOSIS — M9902 Segmental and somatic dysfunction of thoracic region: Secondary | ICD-10-CM | POA: Diagnosis not present

## 2023-02-18 DIAGNOSIS — M4723 Other spondylosis with radiculopathy, cervicothoracic region: Secondary | ICD-10-CM | POA: Diagnosis not present

## 2023-02-18 DIAGNOSIS — M9901 Segmental and somatic dysfunction of cervical region: Secondary | ICD-10-CM | POA: Diagnosis not present

## 2023-02-18 DIAGNOSIS — M9902 Segmental and somatic dysfunction of thoracic region: Secondary | ICD-10-CM | POA: Diagnosis not present

## 2023-02-18 DIAGNOSIS — M546 Pain in thoracic spine: Secondary | ICD-10-CM | POA: Diagnosis not present

## 2023-02-25 DIAGNOSIS — M9901 Segmental and somatic dysfunction of cervical region: Secondary | ICD-10-CM | POA: Diagnosis not present

## 2023-02-25 DIAGNOSIS — M546 Pain in thoracic spine: Secondary | ICD-10-CM | POA: Diagnosis not present

## 2023-02-25 DIAGNOSIS — M9902 Segmental and somatic dysfunction of thoracic region: Secondary | ICD-10-CM | POA: Diagnosis not present

## 2023-02-25 DIAGNOSIS — M4723 Other spondylosis with radiculopathy, cervicothoracic region: Secondary | ICD-10-CM | POA: Diagnosis not present

## 2023-02-27 DIAGNOSIS — M546 Pain in thoracic spine: Secondary | ICD-10-CM | POA: Diagnosis not present

## 2023-02-27 DIAGNOSIS — M9901 Segmental and somatic dysfunction of cervical region: Secondary | ICD-10-CM | POA: Diagnosis not present

## 2023-02-27 DIAGNOSIS — M4723 Other spondylosis with radiculopathy, cervicothoracic region: Secondary | ICD-10-CM | POA: Diagnosis not present

## 2023-02-27 DIAGNOSIS — M9902 Segmental and somatic dysfunction of thoracic region: Secondary | ICD-10-CM | POA: Diagnosis not present

## 2023-03-03 DIAGNOSIS — M4723 Other spondylosis with radiculopathy, cervicothoracic region: Secondary | ICD-10-CM | POA: Diagnosis not present

## 2023-03-03 DIAGNOSIS — M9902 Segmental and somatic dysfunction of thoracic region: Secondary | ICD-10-CM | POA: Diagnosis not present

## 2023-03-03 DIAGNOSIS — M546 Pain in thoracic spine: Secondary | ICD-10-CM | POA: Diagnosis not present

## 2023-03-03 DIAGNOSIS — M9901 Segmental and somatic dysfunction of cervical region: Secondary | ICD-10-CM | POA: Diagnosis not present

## 2023-03-06 DIAGNOSIS — M9901 Segmental and somatic dysfunction of cervical region: Secondary | ICD-10-CM | POA: Diagnosis not present

## 2023-03-06 DIAGNOSIS — M4723 Other spondylosis with radiculopathy, cervicothoracic region: Secondary | ICD-10-CM | POA: Diagnosis not present

## 2023-03-06 DIAGNOSIS — M9902 Segmental and somatic dysfunction of thoracic region: Secondary | ICD-10-CM | POA: Diagnosis not present

## 2023-03-06 DIAGNOSIS — M546 Pain in thoracic spine: Secondary | ICD-10-CM | POA: Diagnosis not present

## 2023-03-10 DIAGNOSIS — M4723 Other spondylosis with radiculopathy, cervicothoracic region: Secondary | ICD-10-CM | POA: Diagnosis not present

## 2023-03-10 DIAGNOSIS — M9902 Segmental and somatic dysfunction of thoracic region: Secondary | ICD-10-CM | POA: Diagnosis not present

## 2023-03-10 DIAGNOSIS — M546 Pain in thoracic spine: Secondary | ICD-10-CM | POA: Diagnosis not present

## 2023-03-10 DIAGNOSIS — M9901 Segmental and somatic dysfunction of cervical region: Secondary | ICD-10-CM | POA: Diagnosis not present

## 2023-03-12 DIAGNOSIS — M9902 Segmental and somatic dysfunction of thoracic region: Secondary | ICD-10-CM | POA: Diagnosis not present

## 2023-03-12 DIAGNOSIS — M9901 Segmental and somatic dysfunction of cervical region: Secondary | ICD-10-CM | POA: Diagnosis not present

## 2023-03-12 DIAGNOSIS — M4723 Other spondylosis with radiculopathy, cervicothoracic region: Secondary | ICD-10-CM | POA: Diagnosis not present

## 2023-03-12 DIAGNOSIS — M546 Pain in thoracic spine: Secondary | ICD-10-CM | POA: Diagnosis not present

## 2023-03-17 DIAGNOSIS — M4723 Other spondylosis with radiculopathy, cervicothoracic region: Secondary | ICD-10-CM | POA: Diagnosis not present

## 2023-03-17 DIAGNOSIS — M9902 Segmental and somatic dysfunction of thoracic region: Secondary | ICD-10-CM | POA: Diagnosis not present

## 2023-03-17 DIAGNOSIS — M9901 Segmental and somatic dysfunction of cervical region: Secondary | ICD-10-CM | POA: Diagnosis not present

## 2023-03-17 DIAGNOSIS — M546 Pain in thoracic spine: Secondary | ICD-10-CM | POA: Diagnosis not present

## 2023-03-20 DIAGNOSIS — M9902 Segmental and somatic dysfunction of thoracic region: Secondary | ICD-10-CM | POA: Diagnosis not present

## 2023-03-20 DIAGNOSIS — M9901 Segmental and somatic dysfunction of cervical region: Secondary | ICD-10-CM | POA: Diagnosis not present

## 2023-03-20 DIAGNOSIS — M4723 Other spondylosis with radiculopathy, cervicothoracic region: Secondary | ICD-10-CM | POA: Diagnosis not present

## 2023-03-20 DIAGNOSIS — M546 Pain in thoracic spine: Secondary | ICD-10-CM | POA: Diagnosis not present

## 2023-03-24 DIAGNOSIS — M546 Pain in thoracic spine: Secondary | ICD-10-CM | POA: Diagnosis not present

## 2023-03-24 DIAGNOSIS — M9902 Segmental and somatic dysfunction of thoracic region: Secondary | ICD-10-CM | POA: Diagnosis not present

## 2023-03-24 DIAGNOSIS — M4723 Other spondylosis with radiculopathy, cervicothoracic region: Secondary | ICD-10-CM | POA: Diagnosis not present

## 2023-03-24 DIAGNOSIS — M9901 Segmental and somatic dysfunction of cervical region: Secondary | ICD-10-CM | POA: Diagnosis not present

## 2023-04-04 ENCOUNTER — Other Ambulatory Visit: Payer: Self-pay | Admitting: Cardiology

## 2023-04-04 DIAGNOSIS — I480 Paroxysmal atrial fibrillation: Secondary | ICD-10-CM

## 2023-04-06 NOTE — Telephone Encounter (Signed)
Prescription refill request for Eliquis received. Indication:  PAF Last office visit: 01/08/23  Caryl Pina MD Scr: 0.64 on 02/02/23 Age: 72 Weight: 77.7kg  Based on above findings Eliquis 5mg  twice daily is the appropriate dose.  Refill approved.

## 2023-04-08 DIAGNOSIS — M546 Pain in thoracic spine: Secondary | ICD-10-CM | POA: Diagnosis not present

## 2023-04-08 DIAGNOSIS — M4723 Other spondylosis with radiculopathy, cervicothoracic region: Secondary | ICD-10-CM | POA: Diagnosis not present

## 2023-04-08 DIAGNOSIS — M9901 Segmental and somatic dysfunction of cervical region: Secondary | ICD-10-CM | POA: Diagnosis not present

## 2023-04-08 DIAGNOSIS — M9902 Segmental and somatic dysfunction of thoracic region: Secondary | ICD-10-CM | POA: Diagnosis not present

## 2023-04-09 DIAGNOSIS — Z96652 Presence of left artificial knee joint: Secondary | ICD-10-CM | POA: Diagnosis not present

## 2023-04-09 DIAGNOSIS — M25562 Pain in left knee: Secondary | ICD-10-CM | POA: Diagnosis not present

## 2023-04-15 DIAGNOSIS — M4723 Other spondylosis with radiculopathy, cervicothoracic region: Secondary | ICD-10-CM | POA: Diagnosis not present

## 2023-04-15 DIAGNOSIS — M9902 Segmental and somatic dysfunction of thoracic region: Secondary | ICD-10-CM | POA: Diagnosis not present

## 2023-04-15 DIAGNOSIS — M9901 Segmental and somatic dysfunction of cervical region: Secondary | ICD-10-CM | POA: Diagnosis not present

## 2023-04-15 DIAGNOSIS — M546 Pain in thoracic spine: Secondary | ICD-10-CM | POA: Diagnosis not present

## 2023-04-21 DIAGNOSIS — H25813 Combined forms of age-related cataract, bilateral: Secondary | ICD-10-CM | POA: Diagnosis not present

## 2023-04-21 DIAGNOSIS — H524 Presbyopia: Secondary | ICD-10-CM | POA: Diagnosis not present

## 2023-04-21 DIAGNOSIS — H04123 Dry eye syndrome of bilateral lacrimal glands: Secondary | ICD-10-CM | POA: Diagnosis not present

## 2023-04-21 DIAGNOSIS — H5203 Hypermetropia, bilateral: Secondary | ICD-10-CM | POA: Diagnosis not present

## 2023-04-21 DIAGNOSIS — H43813 Vitreous degeneration, bilateral: Secondary | ICD-10-CM | POA: Diagnosis not present

## 2023-04-22 ENCOUNTER — Ambulatory Visit (INDEPENDENT_AMBULATORY_CARE_PROVIDER_SITE_OTHER): Payer: Medicare Other | Admitting: Gastroenterology

## 2023-04-22 ENCOUNTER — Encounter: Payer: Self-pay | Admitting: Gastroenterology

## 2023-04-22 VITALS — BP 122/68 | HR 66 | Ht 63.0 in | Wt 171.0 lb

## 2023-04-22 DIAGNOSIS — R140 Abdominal distension (gaseous): Secondary | ICD-10-CM | POA: Diagnosis not present

## 2023-04-22 DIAGNOSIS — K5730 Diverticulosis of large intestine without perforation or abscess without bleeding: Secondary | ICD-10-CM

## 2023-04-22 DIAGNOSIS — K581 Irritable bowel syndrome with constipation: Secondary | ICD-10-CM | POA: Diagnosis not present

## 2023-04-22 DIAGNOSIS — R110 Nausea: Secondary | ICD-10-CM | POA: Diagnosis not present

## 2023-04-22 MED ORDER — ONDANSETRON 4 MG PO TBDP: 4.0000 mg | ORAL_TABLET | Freq: Four times a day (QID) | ORAL | 1 refills | Status: AC | PRN

## 2023-04-22 NOTE — Patient Instructions (Signed)
We have sent the following medications to your pharmacy for you to pick up at your convenience: Zofran  Follow up as needed. Call with any questions or concerns.   _______________________________________________________  If your blood pressure at your visit was 140/90 or greater, please contact your primary care physician to follow up on this.  _______________________________________________________  If you are age 73 or older, your body mass index should be between 23-30. Your Body mass index is 30.29 kg/m. If this is out of the aforementioned range listed, please consider follow up with your Primary Care Provider.  If you are age 77 or younger, your body mass index should be between 19-25. Your Body mass index is 30.29 kg/m. If this is out of the aformentioned range listed, please consider follow up with your Primary Care Provider.   __________________________________________________________  The [facility] providers would like to encourage you to use [facility] to communicate with providers for non-urgent requests or questions.  Due to long hold times on the telephone, sending your provider a message by [facility] may be a faster and more efficient way to get a response.  Please allow 48 business hours for a response.  Please remember that this is for non-urgent requests.   Thank you for choosing me and [facility].  [name], D.O.

## 2023-04-22 NOTE — Progress Notes (Signed)
Chief Complaint:    ER follow-up, nausea  GI History: Melissa Rodgers is a 72 y.o. female with a history of paroxysmal atrial fibrillation (flecainide, Eliquis), hypertension, GERD, low back pain, who follows in the GI Clinic for diverticular disease, IBS, and GERD.  Previously followed with [facility], established care with me in 02/2020.   1) Diverticulosis: Patient with episode of diverticulitis in 07/2019 -CT 07/2019: Severe, acute sigmoid diverticulitis; treated with antibiotics -Colonoscopy 09/2019 ([facility]): moderate diverticulosis of the descending and sigmoid colon, lipoma, internal hemorrhoids, and 2 mm inflammatory polyp of the sigmoid colon, 1.5 cm ascending colon lipoma -CT 12/05/2019: Normal, resolution of sigmoid diverticulitis.  Treated with 14-day course of Augmentin for LLQ tenderness -CT 01/17/2020: Mild diverticulosis without diverticulitis, stable uterine fibroid - 04/2022: Distal sigmoid diverticulitis w/o abscess diagnosed on CT during ER evaluation in [place], and wife.  Treated with Augmentin   2) IBS-C:  Intermittent abdominal cramping and constipation, with occasional overflow diarrhea.  Improved with MiraLAX and Benefiber. Uses Gas-X for gas/bloating.  History of epigastric pain resolved with clonazepam as needed. - 09/17/2021: Recommended trial Linzess 72 mcg/day, but she never started   3) GERD: History of GERD, with previous hiatal hernia repair and Nissen fundoplication in 2017.  Reflux now well controlled, no longer needs any acid suppression therapy. -10/08/2021: EGD: Intact Nissen fundoplication.   Otherwise normal esophagus and duodenum  HPI:     Patient is a 72 y.o. female presenting to the Gastroenterology Clinic for ER follow-up.  Was last seen by me on 05/22/2022, which was following episode of uncomplicated diverticulitis diagnosed in [place] and treated with Augmentin as above.  We had planned for probiotics with consideration for repeat colonoscopy depending on  symptoms.  Was also prescribed Bentyl and low FODMAP diet for IBS.  Was referred to GYN for uterine fibroids.  She was seen in the ER on 02/02/2023 with abdominal pain, nausea with retching, and diarrhea.  WBC 17.2, otherwise normal CBC, CMP, lipase, PT/INR, UA.  CT A/P with contrast with gastric wall thickening suspicious for gastroenteritis but no diverticulitis.  Was treated with antiemetics, IV fluids with improvement and discharged home with Zofran.  Today, she states she has fully recovered from her presumed viral gastroenteritis and no recurrence.  She is requesting a refill of Zofran in case she has any recurrence.  Otherwise no active symptoms today.  Constipation controlled with colace and dietary modifications.   Review of systems:     No chest pain, no SOB, no fevers, no urinary sx   Past Medical History:  Diagnosis Date   Allergy    seasonal   Anemia    Atrial fibrillation 06/18/2019   BMI 30.0-30.9,adult 01/27/2013   Candidosis of skin 04/17/2016   Formatting of this note might be different from the original. 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 01/29/15:  24 Hour Holter Monitor CONCLUSIONS: 1. Overall normal 24-hour Holter monitor recording, with rare, brief,  asymptomatic nonsustained supraventricular tachycardia as described.  2. No cardiac rhythm explanation for patient's symptoms.       Primary osteoarthritis of both knees 10/07/2016   Formatting of this note might be different from the original. 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   Primary osteoarthritis of right knee 11/21/2014   S/P laparoscopic fundoplication 03/17/2016   Scar condition and fibrosis of skin 04/17/2016   Formatting of this note might be different from the original. lower abdomen   Sebaceous hyperplasia 04/17/2016   Seborrheic keratoses 04/17/2016   Skin tags, multiple acquired 04/17/2016   Solar lentigo 04/17/2016   Subarachnoid hemorrhage following injury [facility]) 2010   Fall from horse; brief hospitalization    Patient's surgical history, family medical history, social history, medications and allergies were all reviewed in Epic    Current Outpatient Medications  Medication Sig Dispense Refill   Acetaminophen (TYLENOL PO) Take 2 tablets by mouth daily as needed (pain).     amoxicillin (AMOXIL) 500 MG tablet Take 2,000 mg by mouth See admin instructions. Take 2000 mg by mouth 1 hour before dental appointment     bismuth subsalicylate (PEPTO BISMOL) 262 MG/15ML suspension Take 15 mLs by mouth as needed for indigestion.     calcium carbonate (TUMS - DOSED IN MG ELEMENTAL CALCIUM) 500 MG chewable tablet Chew 1,000 mg by mouth as needed for indigestion or heartburn.     cetirizine (ZYRTEC) 10 MG tablet Take 10 mg by mouth at bedtime.     clonazePAM (KLONOPIN) 0.5 MG tablet Take 0.5 tablets (0.25 mg total) by mouth daily as needed for anxiety. 15 tablet 0   Cyanocobalamin (B-12 PO) Take 1 tablet by  mouth 2 (two) times a week.     DIGESTIVE ENZYMES PO Take 1 capsule by mouth in the morning and at bedtime.     docusate sodium (COLACE) 100 MG capsule Take 100 mg by mouth daily.     ELIQUIS 5 MG TABS tablet TAKE 1 TABLET TWICE A DAY 180 tablet 1   flecainide (TAMBOCOR) 50 MG tablet TAKE 1 TABLET(50 MG) BY MOUTH TWICE DAILY (Patient taking differently: Take 50 mg by mouth 2 (two) times daily.) 180 tablet 2   FLUoxetine (PROZAC) 20 MG tablet TAKE 1 TABLET(20 MG) BY MOUTH DAILY 90 tablet 0   HYDROcodone-acetaminophen (NORCO/VICODIN) 5-325 MG tablet Take 1 tablet by mouth every 8 (eight) hours as needed for moderate pain. 15 tablet 0   metoprolol succinate (TOPROL-XL) 25 MG 24 hr tablet TAKE 1 TABLET(25 MG) BY MOUTH DAILY 90 tablet 0   Polyethyl Glycol-Propyl Glycol (SYSTANE OP) Place 1 drop into both eyes in the morning and at bedtime.     ZINC OXIDE, TOPICAL, EX Apply 1 Application topically as needed (rash under stomach).     No current facility-administered medications for this visit.    Physical Exam:     Ht 5\' 3"  (1.6 m)   Wt 171 lb (77.6 kg)   BMI 30.29 kg/m   GENERAL:  Pleasant female in NAD PSYCH: : Cooperative, normal affect EENT:  conjunctiva pink, mucous membranes moist, neck supple without masses CARDIAC:  RRR, no murmur heard, no peripheral edema PULM: Normal respiratory effort, lungs CTA bilaterally, no wheezing ABDOMEN:  Nondistended, soft. SKIN:  turgor, no lesions seen Musculoskeletal:  Normal muscle tone, normal strength NEURO: Alert and oriented x 3, no focal neurologic deficits   IMPRESSION and PLAN:    1) Nausea Presumably viral gastroenteritis.  Symptoms have since resolved.  Patient very fearful of a return of symptoms, particularly with her inability to vomit after her previous Nissen fundoplication.  Requesting Rx for Zofran in case symptoms should recur. -Provided with Rx for Zofran ODT 4 mg prn Q6 hours for nausea, #30, RF1  2)  Diverticulosis Diverticulosis with history of diverticulitis, with last episode in 04/2022.  No recurrence since then. - Plan for repeat colonoscopy 2025 for surveillance  3) IBS-C 4) Abdominal bloating - Well-controlled on current therapy  RTC prn          Shellia Cleverly ,DO, FACG 04/22/2023, 1:37 PM

## 2023-04-23 ENCOUNTER — Other Ambulatory Visit: Payer: Self-pay | Admitting: Cardiology

## 2023-04-23 DIAGNOSIS — M546 Pain in thoracic spine: Secondary | ICD-10-CM | POA: Diagnosis not present

## 2023-04-23 DIAGNOSIS — M9902 Segmental and somatic dysfunction of thoracic region: Secondary | ICD-10-CM | POA: Diagnosis not present

## 2023-04-23 DIAGNOSIS — M4723 Other spondylosis with radiculopathy, cervicothoracic region: Secondary | ICD-10-CM | POA: Diagnosis not present

## 2023-04-23 DIAGNOSIS — M9901 Segmental and somatic dysfunction of cervical region: Secondary | ICD-10-CM | POA: Diagnosis not present

## 2023-04-29 DIAGNOSIS — M9902 Segmental and somatic dysfunction of thoracic region: Secondary | ICD-10-CM | POA: Diagnosis not present

## 2023-04-29 DIAGNOSIS — M9901 Segmental and somatic dysfunction of cervical region: Secondary | ICD-10-CM | POA: Diagnosis not present

## 2023-04-29 DIAGNOSIS — M4723 Other spondylosis with radiculopathy, cervicothoracic region: Secondary | ICD-10-CM | POA: Diagnosis not present

## 2023-04-29 DIAGNOSIS — M546 Pain in thoracic spine: Secondary | ICD-10-CM | POA: Diagnosis not present

## 2023-05-02 ENCOUNTER — Other Ambulatory Visit: Payer: Self-pay | Admitting: Medical-Surgical

## 2023-05-08 DIAGNOSIS — M546 Pain in thoracic spine: Secondary | ICD-10-CM | POA: Diagnosis not present

## 2023-05-08 DIAGNOSIS — M4723 Other spondylosis with radiculopathy, cervicothoracic region: Secondary | ICD-10-CM | POA: Diagnosis not present

## 2023-05-08 DIAGNOSIS — M9901 Segmental and somatic dysfunction of cervical region: Secondary | ICD-10-CM | POA: Diagnosis not present

## 2023-05-08 DIAGNOSIS — M9902 Segmental and somatic dysfunction of thoracic region: Secondary | ICD-10-CM | POA: Diagnosis not present

## 2023-05-13 DIAGNOSIS — M9902 Segmental and somatic dysfunction of thoracic region: Secondary | ICD-10-CM | POA: Diagnosis not present

## 2023-05-13 DIAGNOSIS — M546 Pain in thoracic spine: Secondary | ICD-10-CM | POA: Diagnosis not present

## 2023-05-13 DIAGNOSIS — M9901 Segmental and somatic dysfunction of cervical region: Secondary | ICD-10-CM | POA: Diagnosis not present

## 2023-05-13 DIAGNOSIS — M4723 Other spondylosis with radiculopathy, cervicothoracic region: Secondary | ICD-10-CM | POA: Diagnosis not present

## 2023-05-20 DIAGNOSIS — M9901 Segmental and somatic dysfunction of cervical region: Secondary | ICD-10-CM | POA: Diagnosis not present

## 2023-05-20 DIAGNOSIS — M9902 Segmental and somatic dysfunction of thoracic region: Secondary | ICD-10-CM | POA: Diagnosis not present

## 2023-05-20 DIAGNOSIS — M4723 Other spondylosis with radiculopathy, cervicothoracic region: Secondary | ICD-10-CM | POA: Diagnosis not present

## 2023-05-20 DIAGNOSIS — M546 Pain in thoracic spine: Secondary | ICD-10-CM | POA: Diagnosis not present

## 2023-06-02 DIAGNOSIS — M4723 Other spondylosis with radiculopathy, cervicothoracic region: Secondary | ICD-10-CM | POA: Diagnosis not present

## 2023-06-02 DIAGNOSIS — M546 Pain in thoracic spine: Secondary | ICD-10-CM | POA: Diagnosis not present

## 2023-06-02 DIAGNOSIS — M9902 Segmental and somatic dysfunction of thoracic region: Secondary | ICD-10-CM | POA: Diagnosis not present

## 2023-06-02 DIAGNOSIS — M9901 Segmental and somatic dysfunction of cervical region: Secondary | ICD-10-CM | POA: Diagnosis not present

## 2023-06-05 DIAGNOSIS — M9901 Segmental and somatic dysfunction of cervical region: Secondary | ICD-10-CM | POA: Diagnosis not present

## 2023-06-05 DIAGNOSIS — M4723 Other spondylosis with radiculopathy, cervicothoracic region: Secondary | ICD-10-CM | POA: Diagnosis not present

## 2023-06-05 DIAGNOSIS — M9902 Segmental and somatic dysfunction of thoracic region: Secondary | ICD-10-CM | POA: Diagnosis not present

## 2023-06-05 DIAGNOSIS — M546 Pain in thoracic spine: Secondary | ICD-10-CM | POA: Diagnosis not present

## 2023-06-09 ENCOUNTER — Encounter: Payer: Self-pay | Admitting: Medical-Surgical

## 2023-06-09 NOTE — Progress Notes (Unsigned)
        Established patient visit  History, exam, impression, and plan:  Primary female hypogonadism Up-to-date testosterone labs.  Due for his next testosterone injection.  Doing well on his current regimen with stable vitals.  Testosterone cypionate 100 mg IM given x 1 in the office.  Due for next shot in 2 weeks.  Lump of skin of lower extremity, left Noted a lump on the left posterior calf approximately 1 month ago.  Initially, had redness, tenderness, swelling at the area however this has fully resolved.  No longer has any residual symptoms but the lump has remained.  His wife noticed it and urged him to be seen to evaluate it.  History notable for varicose veins, compliant with recommendations for compression socks daily.  No recent injury or trauma to the area.  Unclear etiology.  The lump is approximately 1 cm x 1.5 cm and slightly discolored.  See clinical photo.  Plan to get vascular ultrasound as it does seem to be connected to one of the varicose veins that runs through the area.  Suspect superficial thrombophlebitis initially.  Discussed conservative measures with Tylenol, heat, ice, and compression socks.    Procedures performed this visit: None.  Return in about 2 weeks (around 03/04/2023) for nurse visit for testosterone shot.  __________________________________ Joy L. Jessup, DNP, APRN, FNP-BC Primary Care and Sports Medicine [facility] [place]  

## 2023-06-10 ENCOUNTER — Encounter: Payer: Self-pay | Admitting: Medical-Surgical

## 2023-06-10 ENCOUNTER — Ambulatory Visit (INDEPENDENT_AMBULATORY_CARE_PROVIDER_SITE_OTHER): Payer: Medicare Other | Admitting: Medical-Surgical

## 2023-06-10 VITALS — BP 125/76 | HR 64 | Resp 20 | Ht 63.0 in | Wt 171.1 lb

## 2023-06-10 DIAGNOSIS — E785 Hyperlipidemia, unspecified: Secondary | ICD-10-CM | POA: Diagnosis not present

## 2023-06-10 DIAGNOSIS — F411 Generalized anxiety disorder: Secondary | ICD-10-CM | POA: Diagnosis not present

## 2023-06-10 DIAGNOSIS — Z23 Encounter for immunization: Secondary | ICD-10-CM | POA: Diagnosis not present

## 2023-06-10 DIAGNOSIS — R7303 Prediabetes: Secondary | ICD-10-CM

## 2023-06-10 DIAGNOSIS — I480 Paroxysmal atrial fibrillation: Secondary | ICD-10-CM | POA: Diagnosis not present

## 2023-06-10 DIAGNOSIS — I10 Essential (primary) hypertension: Secondary | ICD-10-CM | POA: Diagnosis not present

## 2023-06-10 DIAGNOSIS — F410 Panic disorder [episodic paroxysmal anxiety] without agoraphobia: Secondary | ICD-10-CM

## 2023-06-10 LAB — POCT GLYCOSYLATED HEMOGLOBIN (HGB A1C)
HbA1c, POC (prediabetic range): 5.8 % (ref 5.7–6.4)
Hemoglobin A1C: 5.8 % — AB (ref 4.0–5.6)

## 2023-06-10 MED ORDER — CLONAZEPAM 0.5 MG PO TABS: 0.2500 mg | ORAL_TABLET | Freq: Every day | ORAL | 0 refills | Status: DC | PRN

## 2023-06-10 MED ORDER — FLUOXETINE HCL 20 MG PO TABS: 20.0000 mg | ORAL_TABLET | Freq: Every day | ORAL | 3 refills | Status: DC

## 2023-06-11 DIAGNOSIS — M4723 Other spondylosis with radiculopathy, cervicothoracic region: Secondary | ICD-10-CM | POA: Diagnosis not present

## 2023-06-11 DIAGNOSIS — M9902 Segmental and somatic dysfunction of thoracic region: Secondary | ICD-10-CM | POA: Diagnosis not present

## 2023-06-11 DIAGNOSIS — M546 Pain in thoracic spine: Secondary | ICD-10-CM | POA: Diagnosis not present

## 2023-06-11 DIAGNOSIS — M9901 Segmental and somatic dysfunction of cervical region: Secondary | ICD-10-CM | POA: Diagnosis not present

## 2023-06-18 DIAGNOSIS — M9901 Segmental and somatic dysfunction of cervical region: Secondary | ICD-10-CM | POA: Diagnosis not present

## 2023-06-18 DIAGNOSIS — M546 Pain in thoracic spine: Secondary | ICD-10-CM | POA: Diagnosis not present

## 2023-06-18 DIAGNOSIS — M9902 Segmental and somatic dysfunction of thoracic region: Secondary | ICD-10-CM | POA: Diagnosis not present

## 2023-06-18 DIAGNOSIS — M4723 Other spondylosis with radiculopathy, cervicothoracic region: Secondary | ICD-10-CM | POA: Diagnosis not present

## 2023-06-24 DIAGNOSIS — M4723 Other spondylosis with radiculopathy, cervicothoracic region: Secondary | ICD-10-CM | POA: Diagnosis not present

## 2023-06-24 DIAGNOSIS — M9902 Segmental and somatic dysfunction of thoracic region: Secondary | ICD-10-CM | POA: Diagnosis not present

## 2023-06-24 DIAGNOSIS — M9901 Segmental and somatic dysfunction of cervical region: Secondary | ICD-10-CM | POA: Diagnosis not present

## 2023-06-24 DIAGNOSIS — M546 Pain in thoracic spine: Secondary | ICD-10-CM | POA: Diagnosis not present

## 2023-07-03 DIAGNOSIS — M4723 Other spondylosis with radiculopathy, cervicothoracic region: Secondary | ICD-10-CM | POA: Diagnosis not present

## 2023-07-03 DIAGNOSIS — M9901 Segmental and somatic dysfunction of cervical region: Secondary | ICD-10-CM | POA: Diagnosis not present

## 2023-07-03 DIAGNOSIS — M9902 Segmental and somatic dysfunction of thoracic region: Secondary | ICD-10-CM | POA: Diagnosis not present

## 2023-07-03 DIAGNOSIS — M546 Pain in thoracic spine: Secondary | ICD-10-CM | POA: Diagnosis not present

## 2023-07-08 DIAGNOSIS — M9902 Segmental and somatic dysfunction of thoracic region: Secondary | ICD-10-CM | POA: Diagnosis not present

## 2023-07-08 DIAGNOSIS — M4723 Other spondylosis with radiculopathy, cervicothoracic region: Secondary | ICD-10-CM | POA: Diagnosis not present

## 2023-07-08 DIAGNOSIS — M546 Pain in thoracic spine: Secondary | ICD-10-CM | POA: Diagnosis not present

## 2023-07-08 DIAGNOSIS — M9901 Segmental and somatic dysfunction of cervical region: Secondary | ICD-10-CM | POA: Diagnosis not present

## 2023-07-16 DIAGNOSIS — M9902 Segmental and somatic dysfunction of thoracic region: Secondary | ICD-10-CM | POA: Diagnosis not present

## 2023-07-16 DIAGNOSIS — M4723 Other spondylosis with radiculopathy, cervicothoracic region: Secondary | ICD-10-CM | POA: Diagnosis not present

## 2023-07-16 DIAGNOSIS — M546 Pain in thoracic spine: Secondary | ICD-10-CM | POA: Diagnosis not present

## 2023-07-16 DIAGNOSIS — M9901 Segmental and somatic dysfunction of cervical region: Secondary | ICD-10-CM | POA: Diagnosis not present

## 2023-07-21 ENCOUNTER — Ambulatory Visit: Payer: Medicare Other | Admitting: Cardiology

## 2023-07-23 DIAGNOSIS — M9901 Segmental and somatic dysfunction of cervical region: Secondary | ICD-10-CM | POA: Diagnosis not present

## 2023-07-23 DIAGNOSIS — M546 Pain in thoracic spine: Secondary | ICD-10-CM | POA: Diagnosis not present

## 2023-07-23 DIAGNOSIS — M9902 Segmental and somatic dysfunction of thoracic region: Secondary | ICD-10-CM | POA: Diagnosis not present

## 2023-07-23 DIAGNOSIS — M4723 Other spondylosis with radiculopathy, cervicothoracic region: Secondary | ICD-10-CM | POA: Diagnosis not present

## 2023-07-23 DIAGNOSIS — Z23 Encounter for immunization: Secondary | ICD-10-CM | POA: Diagnosis not present

## 2023-07-29 DIAGNOSIS — M9901 Segmental and somatic dysfunction of cervical region: Secondary | ICD-10-CM | POA: Diagnosis not present

## 2023-07-29 DIAGNOSIS — M9902 Segmental and somatic dysfunction of thoracic region: Secondary | ICD-10-CM | POA: Diagnosis not present

## 2023-07-29 DIAGNOSIS — M4723 Other spondylosis with radiculopathy, cervicothoracic region: Secondary | ICD-10-CM | POA: Diagnosis not present

## 2023-07-29 DIAGNOSIS — M546 Pain in thoracic spine: Secondary | ICD-10-CM | POA: Diagnosis not present

## 2023-08-06 DIAGNOSIS — M546 Pain in thoracic spine: Secondary | ICD-10-CM | POA: Diagnosis not present

## 2023-08-06 DIAGNOSIS — M4723 Other spondylosis with radiculopathy, cervicothoracic region: Secondary | ICD-10-CM | POA: Diagnosis not present

## 2023-08-06 DIAGNOSIS — M9901 Segmental and somatic dysfunction of cervical region: Secondary | ICD-10-CM | POA: Diagnosis not present

## 2023-08-06 DIAGNOSIS — M9902 Segmental and somatic dysfunction of thoracic region: Secondary | ICD-10-CM | POA: Diagnosis not present

## 2023-08-13 DIAGNOSIS — M4723 Other spondylosis with radiculopathy, cervicothoracic region: Secondary | ICD-10-CM | POA: Diagnosis not present

## 2023-08-13 DIAGNOSIS — M546 Pain in thoracic spine: Secondary | ICD-10-CM | POA: Diagnosis not present

## 2023-08-13 DIAGNOSIS — M9902 Segmental and somatic dysfunction of thoracic region: Secondary | ICD-10-CM | POA: Diagnosis not present

## 2023-08-13 DIAGNOSIS — M9901 Segmental and somatic dysfunction of cervical region: Secondary | ICD-10-CM | POA: Diagnosis not present

## 2023-08-17 DIAGNOSIS — L739 Follicular disorder, unspecified: Secondary | ICD-10-CM | POA: Diagnosis not present

## 2023-08-17 DIAGNOSIS — D225 Melanocytic nevi of trunk: Secondary | ICD-10-CM | POA: Diagnosis not present

## 2023-08-17 DIAGNOSIS — L820 Inflamed seborrheic keratosis: Secondary | ICD-10-CM | POA: Diagnosis not present

## 2023-08-17 DIAGNOSIS — L814 Other melanin hyperpigmentation: Secondary | ICD-10-CM | POA: Diagnosis not present

## 2023-08-17 DIAGNOSIS — L299 Pruritus, unspecified: Secondary | ICD-10-CM | POA: Diagnosis not present

## 2023-08-20 DIAGNOSIS — M9902 Segmental and somatic dysfunction of thoracic region: Secondary | ICD-10-CM | POA: Diagnosis not present

## 2023-08-20 DIAGNOSIS — M9901 Segmental and somatic dysfunction of cervical region: Secondary | ICD-10-CM | POA: Diagnosis not present

## 2023-08-20 DIAGNOSIS — M546 Pain in thoracic spine: Secondary | ICD-10-CM | POA: Diagnosis not present

## 2023-08-20 DIAGNOSIS — M4723 Other spondylosis with radiculopathy, cervicothoracic region: Secondary | ICD-10-CM | POA: Diagnosis not present

## 2023-08-27 DIAGNOSIS — M546 Pain in thoracic spine: Secondary | ICD-10-CM | POA: Diagnosis not present

## 2023-08-27 DIAGNOSIS — M9902 Segmental and somatic dysfunction of thoracic region: Secondary | ICD-10-CM | POA: Diagnosis not present

## 2023-08-27 DIAGNOSIS — M9901 Segmental and somatic dysfunction of cervical region: Secondary | ICD-10-CM | POA: Diagnosis not present

## 2023-08-27 DIAGNOSIS — M4723 Other spondylosis with radiculopathy, cervicothoracic region: Secondary | ICD-10-CM | POA: Diagnosis not present

## 2023-09-03 DIAGNOSIS — M9901 Segmental and somatic dysfunction of cervical region: Secondary | ICD-10-CM | POA: Diagnosis not present

## 2023-09-03 DIAGNOSIS — M4723 Other spondylosis with radiculopathy, cervicothoracic region: Secondary | ICD-10-CM | POA: Diagnosis not present

## 2023-09-03 DIAGNOSIS — M9902 Segmental and somatic dysfunction of thoracic region: Secondary | ICD-10-CM | POA: Diagnosis not present

## 2023-09-03 DIAGNOSIS — M546 Pain in thoracic spine: Secondary | ICD-10-CM | POA: Diagnosis not present

## 2023-09-04 DIAGNOSIS — Z23 Encounter for immunization: Secondary | ICD-10-CM | POA: Diagnosis not present

## 2023-09-10 DIAGNOSIS — M4723 Other spondylosis with radiculopathy, cervicothoracic region: Secondary | ICD-10-CM | POA: Diagnosis not present

## 2023-09-10 DIAGNOSIS — M546 Pain in thoracic spine: Secondary | ICD-10-CM | POA: Diagnosis not present

## 2023-09-10 DIAGNOSIS — M9901 Segmental and somatic dysfunction of cervical region: Secondary | ICD-10-CM | POA: Diagnosis not present

## 2023-09-10 DIAGNOSIS — M9902 Segmental and somatic dysfunction of thoracic region: Secondary | ICD-10-CM | POA: Diagnosis not present

## 2023-09-14 ENCOUNTER — Other Ambulatory Visit: Payer: Self-pay

## 2023-09-14 DIAGNOSIS — T7840XA Allergy, unspecified, initial encounter: Secondary | ICD-10-CM | POA: Insufficient documentation

## 2023-09-15 NOTE — Progress Notes (Unsigned)
Cardiology Office Note:    Date:  09/16/2023   ID:  Melissa Rodgers, DOB [birth year], MRN [id number]  PCP:  Christen Butter, NP  Cardiologist:  Norman Herrlich, MD    Referring MD: Christen Butter, NP    ASSESSMENT:    1. Precordial pain   2. PAF (paroxysmal atrial fibrillation) (HCC)   3. High risk medication use   4. Chronic anticoagulation   5. Essential hypertension    PLAN:    In order of problems listed above:  Fortunately cardiac CTA is near normal no ongoing symptoms Maintaining sinus rhythm tolerates low-dose flecainide and continued along with her current anticoagulant.  If she has any breakthrough episodes I think she would benefit from EP Controlled with her beta-blocker continue the same She has declined lipid-lowering treatment   Next appointment: 6 months   Medication Adjustments/Labs and Tests Ordered: Current medicines are reviewed at length with the patient today.  Concerns regarding medicines are outlined above.  Orders Placed This Encounter  Procedures   EKG 12-Lead   No orders of the defined types were placed in this encounter.    History of Present Illness:    Melissa Rodgers is a 72 y.o. female with a hx of paroxysmal atrial fibrillation maintaining sinus rhythm with flecainide chronic anticoagulation and hypertension last seen 01/08/2023 for chest pain. She had a cardiac CTA reported 01/19/2023 with a coronary calcium score of less than 1 and minimal plaque in the proximal LAD less than 25%  Compliance with diet, lifestyle and medications: Yes  She self monitors both mobile Kardia and a smart watch I reviewed multiple strips all sinus rhythm She tolerates flecainide without side effect her anticoagulant without bleeding complication She has had no further chest pain edema shortness of breath or syncope She has decided not text left lipid-lowering therapy and with the coronary calcium score approaching 0 I think this is a reasonable option Her LDL was  140 last determination He is worried about diabetes pain precipitated by statins Past Medical History:  Diagnosis Date   Allergy    seasonal   Chronic bilateral low back pain without sciatica 10/07/2016   DDD (degenerative disc disease), cervical 12/03/2020   Diverticulitis of sigmoid colon 08/01/2019   Dysrhythmia    Afib   Essential hypertension 09/09/2018   Family history of diabetes mellitus 06/09/2022   Fibromyalgia    Gastroesophageal reflux disease without esophagitis 11/06/2015   Generalized anxiety disorder with panic attacks 08/01/2019   Hyperlipidemia 09/09/2018   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   Lumbosacral spondylosis 10/07/2016   Mass of lower inner quadrant of right breast 07/11/2022   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   amoxicillin (AMOXIL) 500 MG tablet Take 2,000 mg by mouth See admin instructions. Take 2000 mg by mouth 1 hour before dental appointment   apixaban (ELIQUIS) 5 MG TABS tablet Take 5 mg by mouth 2 (two) times daily.   bismuth subsalicylate (PEPTO BISMOL) 262 MG/15ML suspension Take 15 mLs by mouth as needed for indigestion.   calcium carbonate (TUMS - DOSED IN MG ELEMENTAL CALCIUM) 500 MG chewable tablet Chew 1,000 mg by mouth as needed for indigestion or heartburn.  cetirizine (ZYRTEC) 10 MG tablet Take 10 mg by mouth at bedtime.   clonazePAM (KLONOPIN) 0.5 MG tablet Take 0.5 tablets (0.25 mg total) by mouth daily as needed for anxiety.   Cyanocobalamin (B-12 PO) Take 1 tablet by mouth 2 (two) times a week.   DIGESTIVE ENZYMES PO Take 1 capsule by mouth in the  morning and at bedtime.   docusate sodium (COLACE) 100 MG capsule Take 100 mg by mouth daily.   flecainide (TAMBOCOR) 50 MG tablet Take 1 tablet (50 mg total) by mouth 2 (two) times daily.   FLUoxetine (PROZAC) 20 MG tablet Take 1 tablet (20 mg total) by mouth daily.   HYDROcodone-acetaminophen (NORCO/VICODIN) 5-325 MG tablet Take 1 tablet by mouth every 8 (eight) hours as needed for moderate pain.   metoprolol succinate (TOPROL-XL) 25 MG 24 hr tablet TAKE 1 TABLET(25 MG) BY MOUTH DAILY (Patient taking differently: Take 25 mg by mouth daily.)   ondansetron (ZOFRAN-ODT) 4 MG disintegrating tablet Take 1 tablet (4 mg total) by mouth every 6 (six) hours as needed for nausea or vomiting.   Polyethyl Glycol-Propyl Glycol (SYSTANE OP) Place 1 drop into both eyes in the morning and at bedtime.   ZINC OXIDE, TOPICAL, EX Apply 1 Application topically as needed (rash under stomach).      EKGs/Labs/Other Studies Reviewed:    The following studies were reviewed today:  Cardiac Studies & Procedures       ECHOCARDIOGRAM  ECHOCARDIOGRAM COMPLETE 10/14/2022  Narrative ECHOCARDIOGRAM REPORT    Patient Name:   Melissa Rodgers Date of Exam: 10/14/2022 Medical Rec #:  [id number]      Height:       63.0 in Accession #:    [id number]     Weight:       171.5 lb Date of Birth:  [birth year]      BSA:          1.811 m Patient Age:    71 years       BP:           128/73 mmHg Patient Gender: F              HR:           70 bpm. Exam Location:  Inpatient  Procedure: 2D Echo, Color Doppler, Cardiac Doppler and Intracardiac Opacification Agent  Indications:    Chest Pain  History:        Patient has prior history of Echocardiogram examinations, most recent 05/03/2021. Arrythmias:Atrial Fibrillation, Signs/Symptoms:Shortness of Breath and Chest Pain; Risk Factors:Hypertension, Dyslipidemia, Former Smoker and ETOH.  Sonographer:    Milda Smart Referring Phys: [id number] MOHAMMAD L GARBA   Sonographer  Comments: Technically difficult study due to poor echo windows and patient is obese. Image acquisition challenging due to patient body habitus and Image acquisition challenging due to respiratory motion. IMPRESSIONS   1. Left ventricular ejection fraction, by estimation, is 65 to 70%. The left ventricle has normal function. The left ventricle has no regional wall motion abnormalities. Left ventricular diastolic parameters are consistent with Grade I diastolic dysfunction (impaired relaxation). 2. Right ventricular systolic function is normal. The right ventricular size is normal. There is normal pulmonary artery systolic pressure. 3. The mitral valve is grossly normal. Trivial mitral valve regurgitation. 4. The aortic valve is tricuspid. Aortic valve regurgitation is mild. Aortic valve sclerosis is present, with no evidence of aortic valve stenosis. 5. The inferior vena cava is normal in size with greater than 50% respiratory variability, suggesting  right atrial pressure of 3 mmHg.  Comparison(s): No significant change from prior study. Prior images reviewed side by side.  FINDINGS Left Ventricle: Left ventricular ejection fraction, by estimation, is 65 to 70%. The left ventricle has normal function. The left ventricle has no regional wall motion abnormalities. The left ventricular internal cavity size was normal in size. There is no left ventricular hypertrophy. Left ventricular diastolic parameters are consistent with Grade I diastolic dysfunction (impaired relaxation).  Right Ventricle: The right ventricular size is normal. No increase in right ventricular wall thickness. Right ventricular systolic function is normal. There is normal pulmonary artery systolic pressure. The tricuspid regurgitant velocity is 2.28 m/s, and with an assumed right atrial pressure of 3 mmHg, the estimated right ventricular systolic pressure is 23.8 mmHg.  Left Atrium: Left atrial size was normal in size.  Right  Atrium: Right atrial size was normal in size.  Pericardium: Trivial pericardial effusion is present.  Mitral Valve: The mitral valve is grossly normal. Mild mitral annular calcification. Trivial mitral valve regurgitation.  Tricuspid Valve: The tricuspid valve is normal in structure. Tricuspid valve regurgitation is mild . No evidence of tricuspid stenosis.  Aortic Valve: The aortic valve is tricuspid. Aortic valve regurgitation is mild. Aortic regurgitation PHT measures 694 msec. Aortic valve sclerosis is present, with no evidence of aortic valve stenosis.  Pulmonic Valve: The pulmonic valve was normal in structure. Pulmonic valve regurgitation is mild. No evidence of pulmonic stenosis.  Aorta: The aortic root, ascending aorta and aortic arch are all structurally normal, with no evidence of dilitation or obstruction.  Venous: The inferior vena cava is normal in size with greater than 50% respiratory variability, suggesting right atrial pressure of 3 mmHg.  IAS/Shunts: No atrial level shunt detected by color flow Doppler.   LEFT VENTRICLE PLAX 2D LVIDd:         4.50 cm     Diastology LVIDs:         2.90 cm     LV e' medial:    6.20 cm/s LV PW:         0.80 cm     LV E/e' medial:  11.7 LV IVS:        0.90 cm     LV e' lateral:   8.55 cm/s LVOT diam:     1.90 cm     LV E/e' lateral: 8.5 LV SV:         80 LV SV Index:   44 LVOT Area:     2.84 cm  LV Volumes (MOD) LV vol d, MOD A2C: 41.0 ml LV vol d, MOD A4C: 79.4 ml LV vol s, MOD A2C: 8.6 ml LV vol s, MOD A4C: 28.2 ml LV SV MOD A2C:     32.4 ml LV SV MOD A4C:     79.4 ml LV SV MOD BP:      43.9 ml  RIGHT VENTRICLE            IVC RV S prime:     9.67 cm/s  IVC diam: 1.40 cm TAPSE (M-mode): 2.1 cm  LEFT ATRIUM             Index        RIGHT ATRIUM           Index LA diam:        4.10 cm 2.26 cm/m   RA Area:     16.60 cm LA Vol (A2C):   47.1 ml 26.00 ml/m  RA  Volume:   43.50 ml  24.01 ml/m LA Vol (A4C):   56.0 ml 30.91  ml/m LA Biplane Vol: 55.2 ml 30.47 ml/m AORTIC VALVE LVOT Vmax:   113.00 cm/s LVOT Vmean:  87.400 cm/s LVOT VTI:    0.281 m AI PHT:      694 msec  AORTA Ao Root diam: 2.80 cm Ao Asc diam:  3.60 cm  MITRAL VALVE               TRICUSPID VALVE MV Area (PHT): 2.75 cm    TR Peak grad:   20.8 mmHg MV Decel Time: 276 msec    TR Mean grad:   15.0 mmHg MV E velocity: 72.80 cm/s  TR Vmax:        228.00 cm/s MV A velocity: 95.50 cm/s  TR Vmean:       183.0 cm/s MV E/A ratio:  0.76 SHUNTS Systemic VTI:  0.28 m Systemic Diam: 1.90 cm  Riley Lam MD Electronically signed by Riley Lam MD Signature Date/Time: 10/14/2022/3:31:28 PM    Final     CT SCANS  CT CORONARY MORPH W/CTA COR W/SCORE 01/19/2023  Addendum 01/20/2023  9:12 PM ADDENDUM REPORT: 01/20/2023 21:10  EXAM: OVER-READ INTERPRETATION  CT CHEST  The following report is an over-read performed by radiologist Dr. [facility] on 01/20/2023. This over-read does not include interpretation of cardiac or coronary anatomy or pathology. The coronary CTA interpretation by the cardiologist is attached.  COMPARISON:  10/13/2022  FINDINGS: Visualized lungs are clear. No visualized thoracic adenopathy. This lies esophagus is unremarkable. Abdomen is largely excluded from view, however, the visualized portion is unremarkable. No acute bone abnormality. No lytic or blastic bone lesion.  IMPRESSION: 1. No significant incidental extracardiac findings.   Electronically Signed By: Helyn Numbers M.D. On: 01/20/2023 21:10  Narrative CLINICAL DATA:  72 year old with chest pain  EXAM: Cardiac/Coronary  CTA  TECHNIQUE: The patient was scanned on a [employer].  FINDINGS: A 80 kV prospective scan was triggered in the descending thoracic aorta at 111 HU's. Axial non-contrast 3 mm slices were carried out through the heart. The data set was analyzed on a dedicated  work station and scored using the Agatson method. Gantry rotation speed was 250 msecs and collimation was .6 mm. 0.8 mg of sl NTG was given. The 3D data set was reconstructed in 5% intervals of the 67-82 % of the R-R cycle. Diastolic phases were analyzed on a dedicated work station using MPR, MIP and VRT modes. The patient received 80 cc of contrast.  Image quality: good  Aorta:  Normal size.  No calcifications.  No dissection.  Aortic Valve:  Trileaflet.  No calcifications.  Coronary Arteries:  Normal coronary origin.  Right dominance.  RCA is a large dominant artery that gives rise to PDA and PLA. There is no plaque.  Left main is a large artery that gives rise to LAD and LCX arteries.  LAD is a large vessel that has small focal proximal plaque, 0-24% stenosis.  -D1 - small caliber, no plaque  -D2 - small caliber, no plaque  -D3 - moderate sized caliber, no plaque  LCX is a non-dominant artery.  There is no plaque.  -OM1 moderate sized, tortuous vessel, no plaque.  -OM2 moderate sized, no plaque.  Other findings:  Normal pulmonary vein drainage into the left atrium.  Normal left atrial appendage without a thrombus.  Borderline dilated pulmonary artery, 31.9 mm.  Please see  radiology report for non cardiac findings.  IMPRESSION: 1. Coronary calcium score of 0.76. This was 35 percentile for age and sex matched control.  2. Normal coronary origin with right dominance.  3. Minimal non obstructive small focal proximal LAD plaque, 0-24% stenosis.  4. Borderline dilated pulmonary artery, 31.9 mm.  CAD-RADS 1. Minimal non-obstructive CAD (0-24%). Consider non-atherosclerotic causes of chest pain. Consider preventive therapy and risk factor modification.  .  Electronically Signed: By: Donato Schultz M.D. On: 01/19/2023 13:24          EKG Interpretation Date/Time:  Wednesday September 16 2023 10:08:05 EDT Ventricular Rate:  60 PR Interval:  192 QRS  Duration:  90 QT Interval:  428 QTC Calculation: 428 R Axis:   -32  Text Interpretation: Normal sinus rhythm Left axis deviation Low voltage QRS Septal infarct v lead placement20-NOV-2023) Abnormal ECG When compared with ECG of 13-Oct-2022 16:12, QRS axis Shifted left Questionable change in initial forces of Anterior leads Confirmed by Norman Herrlich ([id number]) on 09/16/2023 11:17:41 AM   Recent Labs: 10/13/2022: ALT 16; Hemoglobin 14.0; Platelets 281 01/12/2023: BUN 18; Creatinine, Ser 0.56; Potassium 4.0; Sodium 139  Recent Lipid Panel    Component Value Date/Time   CHOL 226 (H) 12/18/2022 0839   TRIG 239 (H) 12/18/2022 0839   HDL 47 (L) 12/18/2022 0839   CHOLHDL 4.8 12/18/2022 0839   LDLCALC 140 (H) 12/18/2022 0839    Physical Exam:    VS:  BP 130/84 (BP Location: Right Arm, Patient Position: Sitting)   Pulse 60   Ht 5\' 3"  (1.6 m)   Wt 176 lb (79.8 kg)   SpO2 97%   BMI 31.18 kg/m     Wt Readings from Last 3 Encounters:  09/16/23 176 lb (79.8 kg)  06/10/23 171 lb 1.9 oz (77.6 kg)  04/22/23 171 lb (77.6 kg)     GEN:  Well nourished, well developed in no acute distress HEENT: Normal NECK: No JVD; No carotid bruits LYMPHATICS: No lymphadenopathy CARDIAC: RRR, no murmurs, rubs, gallops RESPIRATORY:  Clear to auscultation without rales, wheezing or rhonchi  ABDOMEN: Soft, non-tender, non-distended MUSCULOSKELETAL:  No edema; No deformity  SKIN: Warm and dry NEUROLOGIC:  Alert and oriented x 3 PSYCHIATRIC:  Normal affect    Signed, Norman Herrlich, MD  09/16/2023 11:18 AM    [facility]

## 2023-09-16 ENCOUNTER — Encounter: Payer: Self-pay | Admitting: Cardiology

## 2023-09-16 ENCOUNTER — Ambulatory Visit: Payer: Medicare Other | Attending: Cardiology | Admitting: Cardiology

## 2023-09-16 VITALS — BP 130/84 | HR 60 | Ht 63.0 in | Wt 176.0 lb

## 2023-09-16 DIAGNOSIS — I10 Essential (primary) hypertension: Secondary | ICD-10-CM

## 2023-09-16 DIAGNOSIS — R072 Precordial pain: Secondary | ICD-10-CM

## 2023-09-16 DIAGNOSIS — Z7901 Long term (current) use of anticoagulants: Secondary | ICD-10-CM | POA: Diagnosis not present

## 2023-09-16 DIAGNOSIS — Z79899 Other long term (current) drug therapy: Secondary | ICD-10-CM | POA: Diagnosis not present

## 2023-09-16 DIAGNOSIS — I480 Paroxysmal atrial fibrillation: Secondary | ICD-10-CM | POA: Diagnosis not present

## 2023-09-16 NOTE — Patient Instructions (Signed)
Medication Instructions:  Your physician recommends that you continue on your current medications as directed. Please refer to the Current Medication list given to you today.  *If you need a refill on your cardiac medications before your next appointment, please call your pharmacy*   Lab Work: None If you have labs (blood work) drawn today and your tests are completely normal, you will receive your results only by: MyChart Message (if you have MyChart) OR A paper copy in the mail If you have any lab test that is abnormal or we need to change your treatment, we will call you to review the results.   Testing/Procedures: None   Follow-Up: At [facility], you and your health needs are our priority.  As part of our continuing mission to provide you with exceptional heart care, we have created designated Provider Care Teams.  These Care Teams include your primary Cardiologist (physician) and Advanced Practice Providers (APPs -  Physician Assistants and Nurse Practitioners) who all work together to provide you with the care you need, when you need it.  We recommend signing up for the patient portal called "MyChart".  Sign up information is provided on this After Visit Summary.  MyChart is used to connect with patients for Virtual Visits (Telemedicine).  Patients are able to view lab/test results, encounter notes, upcoming appointments, etc.  Non-urgent messages can be sent to your provider as well.   To learn more about what you can do with MyChart, go to [email or web address].    Your next appointment:   6 month(s)  Provider:   Brian Munley, MD    Other Instructions None  

## 2023-09-17 DIAGNOSIS — M4723 Other spondylosis with radiculopathy, cervicothoracic region: Secondary | ICD-10-CM | POA: Diagnosis not present

## 2023-09-17 DIAGNOSIS — M546 Pain in thoracic spine: Secondary | ICD-10-CM | POA: Diagnosis not present

## 2023-09-17 DIAGNOSIS — M9901 Segmental and somatic dysfunction of cervical region: Secondary | ICD-10-CM | POA: Diagnosis not present

## 2023-09-17 DIAGNOSIS — M9902 Segmental and somatic dysfunction of thoracic region: Secondary | ICD-10-CM | POA: Diagnosis not present

## 2023-09-21 ENCOUNTER — Encounter: Payer: Self-pay | Admitting: Medical-Surgical

## 2023-09-21 ENCOUNTER — Other Ambulatory Visit: Payer: Self-pay | Admitting: Medical-Surgical

## 2023-09-21 DIAGNOSIS — Z1231 Encounter for screening mammogram for malignant neoplasm of breast: Secondary | ICD-10-CM

## 2023-09-24 DIAGNOSIS — M9902 Segmental and somatic dysfunction of thoracic region: Secondary | ICD-10-CM | POA: Diagnosis not present

## 2023-09-24 DIAGNOSIS — M9901 Segmental and somatic dysfunction of cervical region: Secondary | ICD-10-CM | POA: Diagnosis not present

## 2023-09-24 DIAGNOSIS — M546 Pain in thoracic spine: Secondary | ICD-10-CM | POA: Diagnosis not present

## 2023-09-24 DIAGNOSIS — M4723 Other spondylosis with radiculopathy, cervicothoracic region: Secondary | ICD-10-CM | POA: Diagnosis not present

## 2023-09-30 DIAGNOSIS — M9902 Segmental and somatic dysfunction of thoracic region: Secondary | ICD-10-CM | POA: Diagnosis not present

## 2023-09-30 DIAGNOSIS — M9901 Segmental and somatic dysfunction of cervical region: Secondary | ICD-10-CM | POA: Diagnosis not present

## 2023-09-30 DIAGNOSIS — M4723 Other spondylosis with radiculopathy, cervicothoracic region: Secondary | ICD-10-CM | POA: Diagnosis not present

## 2023-09-30 DIAGNOSIS — M546 Pain in thoracic spine: Secondary | ICD-10-CM | POA: Diagnosis not present

## 2023-10-03 ENCOUNTER — Other Ambulatory Visit: Payer: Self-pay | Admitting: Cardiology

## 2023-10-05 NOTE — Telephone Encounter (Signed)
Prescription refill request for Eliquis received. Indication:afib Last office visit:10/24 Scr:0.64  3/24 Age: 72 Weight:79.8  kg  Prescription refilled

## 2023-10-08 DIAGNOSIS — M546 Pain in thoracic spine: Secondary | ICD-10-CM | POA: Diagnosis not present

## 2023-10-08 DIAGNOSIS — M4723 Other spondylosis with radiculopathy, cervicothoracic region: Secondary | ICD-10-CM | POA: Diagnosis not present

## 2023-10-08 DIAGNOSIS — M9902 Segmental and somatic dysfunction of thoracic region: Secondary | ICD-10-CM | POA: Diagnosis not present

## 2023-10-08 DIAGNOSIS — M9901 Segmental and somatic dysfunction of cervical region: Secondary | ICD-10-CM | POA: Diagnosis not present

## 2023-10-15 DIAGNOSIS — M546 Pain in thoracic spine: Secondary | ICD-10-CM | POA: Diagnosis not present

## 2023-10-15 DIAGNOSIS — M9902 Segmental and somatic dysfunction of thoracic region: Secondary | ICD-10-CM | POA: Diagnosis not present

## 2023-10-15 DIAGNOSIS — M9901 Segmental and somatic dysfunction of cervical region: Secondary | ICD-10-CM | POA: Diagnosis not present

## 2023-10-15 DIAGNOSIS — M4723 Other spondylosis with radiculopathy, cervicothoracic region: Secondary | ICD-10-CM | POA: Diagnosis not present

## 2023-10-29 DIAGNOSIS — M4723 Other spondylosis with radiculopathy, cervicothoracic region: Secondary | ICD-10-CM | POA: Diagnosis not present

## 2023-10-29 DIAGNOSIS — M9901 Segmental and somatic dysfunction of cervical region: Secondary | ICD-10-CM | POA: Diagnosis not present

## 2023-10-29 DIAGNOSIS — M9902 Segmental and somatic dysfunction of thoracic region: Secondary | ICD-10-CM | POA: Diagnosis not present

## 2023-10-29 DIAGNOSIS — M546 Pain in thoracic spine: Secondary | ICD-10-CM | POA: Diagnosis not present

## 2023-11-04 ENCOUNTER — Ambulatory Visit: Payer: Medicare Other

## 2023-11-05 DIAGNOSIS — M9902 Segmental and somatic dysfunction of thoracic region: Secondary | ICD-10-CM | POA: Diagnosis not present

## 2023-11-05 DIAGNOSIS — M9901 Segmental and somatic dysfunction of cervical region: Secondary | ICD-10-CM | POA: Diagnosis not present

## 2023-11-05 DIAGNOSIS — M4723 Other spondylosis with radiculopathy, cervicothoracic region: Secondary | ICD-10-CM | POA: Diagnosis not present

## 2023-11-05 DIAGNOSIS — M546 Pain in thoracic spine: Secondary | ICD-10-CM | POA: Diagnosis not present

## 2023-11-12 DIAGNOSIS — M9901 Segmental and somatic dysfunction of cervical region: Secondary | ICD-10-CM | POA: Diagnosis not present

## 2023-11-12 DIAGNOSIS — M4723 Other spondylosis with radiculopathy, cervicothoracic region: Secondary | ICD-10-CM | POA: Diagnosis not present

## 2023-11-12 DIAGNOSIS — M546 Pain in thoracic spine: Secondary | ICD-10-CM | POA: Diagnosis not present

## 2023-11-12 DIAGNOSIS — M9902 Segmental and somatic dysfunction of thoracic region: Secondary | ICD-10-CM | POA: Diagnosis not present

## 2023-11-26 DIAGNOSIS — M9901 Segmental and somatic dysfunction of cervical region: Secondary | ICD-10-CM | POA: Diagnosis not present

## 2023-11-26 DIAGNOSIS — M4723 Other spondylosis with radiculopathy, cervicothoracic region: Secondary | ICD-10-CM | POA: Diagnosis not present

## 2023-11-26 DIAGNOSIS — M9902 Segmental and somatic dysfunction of thoracic region: Secondary | ICD-10-CM | POA: Diagnosis not present

## 2023-11-26 DIAGNOSIS — M546 Pain in thoracic spine: Secondary | ICD-10-CM | POA: Diagnosis not present

## 2023-11-28 DIAGNOSIS — Z882 Allergy status to sulfonamides status: Secondary | ICD-10-CM | POA: Diagnosis not present

## 2023-11-28 DIAGNOSIS — Z87891 Personal history of nicotine dependence: Secondary | ICD-10-CM | POA: Diagnosis not present

## 2023-11-28 DIAGNOSIS — R079 Chest pain, unspecified: Secondary | ICD-10-CM | POA: Diagnosis not present

## 2023-11-28 DIAGNOSIS — Z7901 Long term (current) use of anticoagulants: Secondary | ICD-10-CM | POA: Diagnosis not present

## 2023-11-28 DIAGNOSIS — F419 Anxiety disorder, unspecified: Secondary | ICD-10-CM | POA: Diagnosis not present

## 2023-11-28 DIAGNOSIS — R9431 Abnormal electrocardiogram [ECG] [EKG]: Secondary | ICD-10-CM | POA: Diagnosis not present

## 2023-11-28 DIAGNOSIS — R109 Unspecified abdominal pain: Secondary | ICD-10-CM | POA: Diagnosis not present

## 2023-11-28 DIAGNOSIS — E785 Hyperlipidemia, unspecified: Secondary | ICD-10-CM | POA: Diagnosis not present

## 2023-11-28 DIAGNOSIS — K224 Dyskinesia of esophagus: Secondary | ICD-10-CM | POA: Diagnosis not present

## 2023-11-28 DIAGNOSIS — R0789 Other chest pain: Secondary | ICD-10-CM | POA: Diagnosis not present

## 2023-11-28 DIAGNOSIS — Z888 Allergy status to other drugs, medicaments and biological substances status: Secondary | ICD-10-CM | POA: Diagnosis not present

## 2023-11-28 DIAGNOSIS — I4891 Unspecified atrial fibrillation: Secondary | ICD-10-CM | POA: Diagnosis not present

## 2023-11-28 DIAGNOSIS — I10 Essential (primary) hypertension: Secondary | ICD-10-CM | POA: Diagnosis not present

## 2023-11-28 DIAGNOSIS — K219 Gastro-esophageal reflux disease without esophagitis: Secondary | ICD-10-CM | POA: Diagnosis not present

## 2023-11-30 ENCOUNTER — Telehealth: Payer: Self-pay | Admitting: Gastroenterology

## 2023-11-30 NOTE — Telephone Encounter (Signed)
 Inbound call from patient stating she went to the ED yesterday 1/5 due to esophageal spasms and having a hard time swallowing. Patient is requesting a call to discuss further. Please advise, thank you.

## 2023-11-30 NOTE — Telephone Encounter (Signed)
 I have spoken to patient who c/o mild esophageal spasms for some time now. However, over the last month, she has been experiencing more frequent and more severe chest pain which she relates to spasms. This occurs typically with solid foods but can occur with liquids as well. She has had a recent episode where she feels the spasm was so bad, she was unable to get food down and could not vomit it back up either. She denies any shortness of breath or diaphoresis when having chest pain, no nausea or vomiting. She did have some dizziness with the most recent severe episode. Patient went to the ER and cardiac causes of chest pain were ruled out at that time. She was given nitroglycerin  which helped. She also says since being home, she took zofran  and clonazepam  which also helped her symptoms. She has a history of nissen fundoplication.   Patient has been scheduled to see Elida Shawl, NP on 12/07/23 at 10 am. She is advised that she should remain on soft foods, chew well in the meantime. She should also return to the ER should she develop worsening symptoms, diaphoresis with chest pain, SOB etc. She verbalizes understanding.

## 2023-12-03 DIAGNOSIS — M4723 Other spondylosis with radiculopathy, cervicothoracic region: Secondary | ICD-10-CM | POA: Diagnosis not present

## 2023-12-03 DIAGNOSIS — M9901 Segmental and somatic dysfunction of cervical region: Secondary | ICD-10-CM | POA: Diagnosis not present

## 2023-12-03 DIAGNOSIS — M546 Pain in thoracic spine: Secondary | ICD-10-CM | POA: Diagnosis not present

## 2023-12-03 DIAGNOSIS — M9902 Segmental and somatic dysfunction of thoracic region: Secondary | ICD-10-CM | POA: Diagnosis not present

## 2023-12-07 ENCOUNTER — Ambulatory Visit: Payer: Medicare Other | Admitting: Nurse Practitioner

## 2023-12-07 ENCOUNTER — Encounter: Payer: Self-pay | Admitting: Nurse Practitioner

## 2023-12-07 VITALS — BP 130/72 | HR 68 | Ht 63.0 in | Wt 176.1 lb

## 2023-12-07 DIAGNOSIS — K219 Gastro-esophageal reflux disease without esophagitis: Secondary | ICD-10-CM

## 2023-12-07 DIAGNOSIS — K224 Dyskinesia of esophagus: Secondary | ICD-10-CM

## 2023-12-07 NOTE — Progress Notes (Signed)
 12/07/2023 Melissa Rodgers [id number] [birth year]   Chief Complaint: Esophageal spasms  History of Present Illness: Melissa Rodgers is a 73 year old female with a past medical history of hypertension, paroxysmal atrial fibrillation, GERD s/p Nissen fundoplication 2017, diverticulitis/diverticular disease, IBS and colon polyps. She is known by Dr. San. On Saturday 11/28/2023, she took 1 bite of couscous which resulted in a bad spasm in her throat and upper esophagus, she could speak and breathe without any difficulty, this pain came and went in waves.  She tried to take a sip of water which felt like it just got stuck in her throat.  She had dry heaves but could not vomit, doesn't  vomit since she underwent Nissen fundoplication.  She spit up a small amount of white foam with tiny bits of broccoli and couscous.  She took 1 dose of Zofran  without improvement.  She noted having a prior history of esophageal spasms which sometimes occur once every 2 to 3 weeks but this episode was more severe.  She presented to [facility] ED 11/28/2023 for further evaluation.  She was assessed to most likely have esophageal spasms for which she received Nitroglycerin  and Lorazepam  and her symptoms abated.  She denies having any further episodes of throat/esophageal spasms/chest pain since then.  She has intermittent heartburn for which she takes Pepto-Bismol, DGL/licorice root extract Tums with symptom relief.  She denies having any abdominal pain.  She typically passes a normal brown bowel movement most days.  No rectal bleeding or black stools.  No alcohol.  She stopped smoking cigarettes in the 1970s.   She also notes having a history of chronic mid chest discomfort which is unrelated to the times when she experiences esophageal spasms. She last saw her cardiologist Dr. Redell Leiter 01/08/2023 and he assessed her chronic chest pain was likely due to chronic costochondral pain syndrome but also noted that she is at  high risk for CAD.  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  She denies having chest pain at this time.  She has a history of atrial fibrillation on Metoprolol , Flecainide  and Eliquis .  Labs 11/28/2023: WBC 6.9.  Hemoglobin 14.8.  Hematocrit 45.0.  Platelet 263.  Sodium 139.  Potassium 3.9.  Glucose 115.  BUN 16.  Creatinine 0.59.  Calcium  9.8.  Total bili 0.3.  Alk phos 98.  AST AST 28.  ALT 25.  Lipase 54.  Normal troponin level.  Normal D-dimer.  Normal chest x-ray.  KG showed a normal sinus rhythm with nonspecific ST abnormality, no acute ischemia.  CTAP with contrast 02/02/2023 and in the ED due to nausea, abdominal pain and diarrhea.  CT ABDOMEN PELVIS   Lower Chest: Unremarkable   Liver: Unremarkable   Gallbladder and Biliary Tree: Unremarkable   Pancreas: Unremarkable   Spleen: Unremarkable   Adrenal Glands: Unremarkable   Kidneys and Urinary Bladder: Punctate bilateral renal calculi.  Negative for obstructive uropathy or ureteral calculus.  Unremarkable urinary bladder.   Reproductive: Unremarkable   Vasculature: Unremarkable abdominal aorta.  IVC flattening.   Lymph Nodes: Unremarkable   Peritoneum: No free fluid. No free air.  No abscess.   Gastrointestinal Tract: Nelson fundoplication.  Mild diffuse gastric wall thickening.  Multiple mildly distended fluid-filled loops of small and large bowel with air-fluid levels.  No bowel obstruction.  Normal appendix.   Abdominal and Pelvic Walls: Unremarkable   Bones: No acute or destructive osseous  process.  Multilevel spondylosis.  Scoliosis.    IMPRESSION:  CT ABDOMEN PELVIS  1.  Suspected gastroenteritis.  2.  Negative for bowel obstruction.  3.  No evidence for acute appendicitis.  4.  IVC flattening suggesting intravascular volume depletion.   PAST GI PROCEDURES:  EGD 10/08/2021:  - Normal esophagus - Z-line regular, 35 cm from  the incisors.  - A Nissen fundoplication was found. The wrap appears intact.  - Mild, non-ulcer gastritis. Biopsied.  - Normal examined duodenum.  Colonoscopy 10/04/2019 [facility]: 2 mm polyp removed from the sigmoid colon Moderate diverticulosis of the descending and sigmoid colon Lipoma in the ascending colon Internal hemorrhoids Path report showed polypoid granulation tissue with inflammation and reactive changes negative for hyperplastic or adenomatous change  Current Outpatient Medications on File Prior to Visit  Medication Sig Dispense Refill   Acetaminophen  (TYLENOL  PO) Take 2 tablets by mouth daily as needed (pain).     calcium  carbonate (TUMS - DOSED IN MG ELEMENTAL CALCIUM ) 500 MG chewable tablet Chew 1,000 mg by mouth as needed for indigestion or heartburn.     cetirizine (ZYRTEC) 10 MG tablet Take 10 mg by mouth at bedtime.     clonazePAM  (KLONOPIN ) 0.5 MG tablet Take 0.5 tablets (0.25 mg total) by mouth daily as needed for anxiety. 15 tablet 0   Cyanocobalamin (B-12 PO) Take 1 tablet by mouth 2 (two) times a week.     DIGESTIVE ENZYMES PO Take 1 capsule by mouth as needed.     docusate sodium  (COLACE) 100 MG capsule Take 100 mg by mouth as needed.     ELIQUIS  5 MG TABS tablet TAKE 1 TABLET TWICE A DAY 180 tablet 1   flecainide  (TAMBOCOR ) 50 MG tablet Take 1 tablet (50 mg total) by mouth 2 (two) times daily. 180 tablet 2   FLUoxetine  (PROZAC ) 20 MG tablet Take 1 tablet (20 mg total) by mouth daily. 90 tablet 3   HYDROcodone -acetaminophen  (NORCO/VICODIN) 5-325 MG tablet Take 1 tablet by mouth every 8 (eight) hours as needed for moderate pain. 15 tablet 0   metoprolol  succinate (TOPROL -XL) 25 MG 24 hr tablet TAKE 1 TABLET(25 MG) BY MOUTH DAILY (Patient taking differently: Take 25 mg by mouth daily.) 90 tablet 0   ondansetron  (ZOFRAN -ODT) 4 MG disintegrating tablet Take 1 tablet (4 mg total) by mouth every 6 (six) hours as needed for nausea or vomiting. 30 tablet  1   Polyethyl Glycol-Propyl Glycol (SYSTANE OP) Place 1 drop into both eyes in the morning and at bedtime.     amoxicillin  (AMOXIL ) 500 MG tablet Take 2,000 mg by mouth See admin instructions. Take 2000 mg by mouth 1 hour before dental appointment (Patient not taking: Reported on 12/07/2023)     bismuth subsalicylate (PEPTO BISMOL) 262 MG/15ML suspension Take 15 mLs by mouth as needed for indigestion. (Patient not taking: Reported on 12/07/2023)     ZINC OXIDE, TOPICAL, EX Apply 1 Application topically as needed (rash under stomach). (Patient not taking: Reported on 12/07/2023)     No current facility-administered medications on file prior to visit.   Allergies  Allergen Reactions   Crestor [Rosuvastatin] Other (See Comments)    Musculoskeletal Aches Muscle spasms   Sulfa Antibiotics Rash    Childhood reaction    Latex Rash   Current Medications, Allergies, Past Medical History, Past Surgical History, Family History and Social History were reviewed in [employer] record.  Review of Systems:   Constitutional: Negative for fever,  sweats, chills or weight loss.  Respiratory: Negative for shortness of breath.   Cardiovascular: See HPI. Gastrointestinal: See HPI.  Musculoskeletal: Negative for back pain or muscle aches.  Neurological: Negative for dizziness, headaches or paresthesias.   Physical Exam: BP 130/72 (BP Location: Left Arm, Patient Position: Sitting, Cuff Size: Normal)   Pulse 68   Ht 5' 3 (1.6 m)   Wt 176 lb 2 oz (79.9 kg)   BMI 31.20 kg/m  General: 73 year old female in no acute distress. Head: Normocephalic and atraumatic. Eyes: No scleral icterus. Conjunctiva pink . Ears: Normal auditory acuity. Mouth: Dentition intact. No ulcers or lesions.  Lungs: Clear throughout to auscultation. Heart: Regular rate and rhythm, no murmur. Abdomen: Soft, nontender and nondistended. No masses or hepatomegaly. Normal bowel sounds x 4 quadrants.  Rectal:  Deferred.  Musculoskeletal: Symmetrical with no gross deformities. Extremities: No edema. Neurological: Alert oriented x 4. No focal deficits.  Psychological: Alert and cooperative. Normal mood and affect  Assessment and Recommendations:  73 year old female with a history of GERD s/p Nissen fundoplication surgery 2017 with suspected esophageal spasms.  Normal lipase and LFTs. -Barium swallow study with tablet -Consider repeat EGD, await barium swallow study results  -Consider eventual esophageal manometry if the above evaluation unrevealing  -Avoid cold products -Hyoscyamine SL for esophageal spasms, patient declined for now.  She has a supply of Clonazepam  to use this medication if needed for esophageal spasms. -Patient wishes to avoid PPI therapy for now -Patient to contact office if symptoms recur  Atrial fibrillation on Metoprolol , Flecainide  and Eliquis   Chronic chest pain, chronic costochondral pain syndrome per cardiology. Cardiac CT 01/19/2023 showed minimal nonobstructive CAD (0 to 24%).   Colon cancer screening.  Colonoscopy 10/04/2019 identified one 2 mm polyp removed from the sigmoid colon, path report showed benign polypoid granulation tissue.  Moderate diverticulosis to the descending and sigmoid colon and a lipoma in the ascending colon. -Colonoscopy due 09/2024

## 2023-12-07 NOTE — Patient Instructions (Signed)
 You have been scheduled for a Barium Esophogram at [facility] (1st floor of the hospital) on 12/16/23 at 10:00 am. Please arrive 15-20 minutes prior to your appointment for registration. Make certain not to have anything to eat or drink 3 hours prior to your test. If you need to reschedule for any reason, please contact radiology at [phone] to do so. __________________________________________________________________  Go to the ED if you develop severe chest pain.  Avoid cold foods and liquids.  Due to recent changes in healthcare laws, you may see the results of your imaging and laboratory studies on MyChart before your provider has had a chance to review them.  We understand that in some cases there may be results that are confusing or concerning to you. Not all laboratory results come back in the same time frame and the provider may be waiting for multiple results in order to interpret others.  Please give us  48 hours in order for your provider to thoroughly review all the results before contacting the office for clarification of your results.   Thank you for trusting me with your gastrointestinal care!   Elida Shawl, CRNP

## 2023-12-10 DIAGNOSIS — M9901 Segmental and somatic dysfunction of cervical region: Secondary | ICD-10-CM | POA: Diagnosis not present

## 2023-12-10 DIAGNOSIS — M546 Pain in thoracic spine: Secondary | ICD-10-CM | POA: Diagnosis not present

## 2023-12-10 DIAGNOSIS — M9902 Segmental and somatic dysfunction of thoracic region: Secondary | ICD-10-CM | POA: Diagnosis not present

## 2023-12-10 DIAGNOSIS — M4723 Other spondylosis with radiculopathy, cervicothoracic region: Secondary | ICD-10-CM | POA: Diagnosis not present

## 2023-12-11 ENCOUNTER — Encounter: Payer: Self-pay | Admitting: Medical-Surgical

## 2023-12-11 ENCOUNTER — Ambulatory Visit (INDEPENDENT_AMBULATORY_CARE_PROVIDER_SITE_OTHER): Payer: Medicare Other | Admitting: Medical-Surgical

## 2023-12-11 VITALS — BP 130/78 | HR 63 | Resp 20 | Ht 63.0 in | Wt 176.7 lb

## 2023-12-11 DIAGNOSIS — E785 Hyperlipidemia, unspecified: Secondary | ICD-10-CM | POA: Diagnosis not present

## 2023-12-11 DIAGNOSIS — F410 Panic disorder [episodic paroxysmal anxiety] without agoraphobia: Secondary | ICD-10-CM

## 2023-12-11 DIAGNOSIS — R6889 Other general symptoms and signs: Secondary | ICD-10-CM

## 2023-12-11 DIAGNOSIS — Z09 Encounter for follow-up examination after completed treatment for conditions other than malignant neoplasm: Secondary | ICD-10-CM | POA: Diagnosis not present

## 2023-12-11 DIAGNOSIS — I10 Essential (primary) hypertension: Secondary | ICD-10-CM

## 2023-12-11 DIAGNOSIS — F411 Generalized anxiety disorder: Secondary | ICD-10-CM | POA: Diagnosis not present

## 2023-12-11 DIAGNOSIS — R7303 Prediabetes: Secondary | ICD-10-CM | POA: Diagnosis not present

## 2023-12-11 DIAGNOSIS — K219 Gastro-esophageal reflux disease without esophagitis: Secondary | ICD-10-CM

## 2023-12-11 LAB — POCT GLYCOSYLATED HEMOGLOBIN (HGB A1C)
HbA1c, POC (prediabetic range): 5.7 % (ref 5.7–6.4)
Hemoglobin A1C: 5.7 % — AB (ref 4.0–5.6)

## 2023-12-11 MED ORDER — CLONAZEPAM 0.5 MG PO TABS: 0.2500 mg | ORAL_TABLET | Freq: Two times a day (BID) | ORAL | 1 refills | Status: DC | PRN

## 2023-12-11 NOTE — Progress Notes (Signed)
        Established patient visit  History, exam, impression, and plan:  1. Hospital discharge follow-up Melissa Rodgers 73 year old female presenting to today after having a visit at the ED on 1/4 for esophageal spasms.  She has a history of this and notes that it usually lets up after several minutes.  Unfortunately, when she started having the spasms that day, they persisted and lasted for greater than an hour.  At the ED, she was fully evaluated for cardiovascular etiology of the spasms and discomfort but the findings were benign.  She was given nitro, lorazepam, and Zofran which seem to help with her symptoms.  She has been able to get in with gastroenterology and there is a plan for a barium swallow next week.  She was instructed to take clonazepam twice daily at home to prevent recurrence of the persistent spasms.  She took 1/2 tablet twice daily for 3 days and now has reduced to 1/4 tablet twice daily.  Requesting refill.  2. Essential hypertension (Primary) She is followed by cardiology.  Reports home blood pressures of 120/70.  Compliant with medications and denies any current concerns.  Cardiopulmonary exam normal.  Blood pressure is normal today.  Continue current medications per cardiology.  3. Gastroesophageal reflux disease without esophagitis History of GERD with esophageal spasms as noted above.  Has already been followed by gastroenterology with upcoming procedure as noted above.  4. Hyperlipidemia, unspecified hyperlipidemia type She is currently not taking any medications for cholesterol management.  Checking lipids today. - Lipid panel  5. Generalized anxiety disorder with panic attacks Fluoxetine seems to working well for her the 20 mg dose.  Does not desire any change at this time.  Feels that her mood is well-controlled and she is not having panic attacks like she used to.  Using clonazepam as noted above but typically only uses this very infrequently.  Continue fluoxetine and as  needed clonazepam.  6. Cold intolerance Ports having significant issues with cold intolerance and temperature instability.  Having to put on and take off close throughout the day due to discomfort with sweating and sudden burst of heat versus cold.  Labs as below. - Iron, TIBC and Ferritin Panel - TSH  7.  Prediabetes Checking hemoglobin A1c.  Procedures performed this visit: None.  Return in about 6 months (around 06/09/2024) for chronic disease follow up.  __________________________________ Thayer Ohm, DNP, APRN, FNP-BC Primary Care and Sports Medicine [facility] [place]

## 2023-12-11 NOTE — Telephone Encounter (Signed)
Printed and updated.

## 2023-12-12 LAB — IRON,TIBC AND FERRITIN PANEL
Ferritin: 69 ng/mL (ref 15–150)
Iron Saturation: 22 % (ref 15–55)
Iron: 77 ug/dL (ref 27–139)
Total Iron Binding Capacity: 351 ug/dL (ref 250–450)
UIBC: 274 ug/dL (ref 118–369)

## 2023-12-12 LAB — LIPID PANEL
Chol/HDL Ratio: 5.1 {ratio} — ABNORMAL HIGH (ref 0.0–4.4)
Cholesterol, Total: 216 mg/dL — ABNORMAL HIGH (ref 100–199)
HDL: 42 mg/dL (ref >39)
LDL Chol Calc (NIH): 116 mg/dL — ABNORMAL HIGH (ref 0–99)
Triglycerides: 335 mg/dL — ABNORMAL HIGH (ref 0–149)
VLDL Cholesterol Cal: 58 mg/dL — ABNORMAL HIGH (ref 5–40)

## 2023-12-12 LAB — TSH: TSH: 1.73 u[IU]/mL (ref 0.450–4.500)

## 2023-12-14 ENCOUNTER — Encounter: Payer: Self-pay | Admitting: Medical-Surgical

## 2023-12-14 MED ORDER — LOVASTATIN 10 MG PO TABS: 10.0000 mg | ORAL_TABLET | Freq: Every day | ORAL | 1 refills | Status: DC

## 2023-12-16 ENCOUNTER — Other Ambulatory Visit: Payer: Self-pay | Admitting: Nurse Practitioner

## 2023-12-16 ENCOUNTER — Ambulatory Visit (HOSPITAL_COMMUNITY)
Admission: RE | Admit: 2023-12-16 | Discharge: 2023-12-16 | Disposition: A | Discharge status: Home / Self Care | Payer: Medicare Other | Source: Ambulatory Visit | Attending: Nurse Practitioner | Admitting: Nurse Practitioner

## 2023-12-16 DIAGNOSIS — K224 Dyskinesia of esophagus: Secondary | ICD-10-CM | POA: Insufficient documentation

## 2023-12-16 DIAGNOSIS — K219 Gastro-esophageal reflux disease without esophagitis: Secondary | ICD-10-CM

## 2023-12-16 DIAGNOSIS — K2289 Other specified disease of esophagus: Secondary | ICD-10-CM | POA: Diagnosis not present

## 2023-12-17 DIAGNOSIS — M9902 Segmental and somatic dysfunction of thoracic region: Secondary | ICD-10-CM | POA: Diagnosis not present

## 2023-12-17 DIAGNOSIS — M546 Pain in thoracic spine: Secondary | ICD-10-CM | POA: Diagnosis not present

## 2023-12-17 DIAGNOSIS — M4723 Other spondylosis with radiculopathy, cervicothoracic region: Secondary | ICD-10-CM | POA: Diagnosis not present

## 2023-12-17 DIAGNOSIS — M9901 Segmental and somatic dysfunction of cervical region: Secondary | ICD-10-CM | POA: Diagnosis not present

## 2023-12-20 NOTE — Progress Notes (Signed)
Agree with the assessment and plan as outlined by Alcide Evener, NP.   Doristine Locks, DO, [facility]

## 2023-12-24 DIAGNOSIS — M9902 Segmental and somatic dysfunction of thoracic region: Secondary | ICD-10-CM | POA: Diagnosis not present

## 2023-12-24 DIAGNOSIS — M546 Pain in thoracic spine: Secondary | ICD-10-CM | POA: Diagnosis not present

## 2023-12-24 DIAGNOSIS — M9901 Segmental and somatic dysfunction of cervical region: Secondary | ICD-10-CM | POA: Diagnosis not present

## 2023-12-24 DIAGNOSIS — M4723 Other spondylosis with radiculopathy, cervicothoracic region: Secondary | ICD-10-CM | POA: Diagnosis not present

## 2023-12-28 NOTE — Telephone Encounter (Signed)
Dr. Barron Alvine, pls review patient's message below as she requested your input as well. I am ok with prescribing her PPI. She previously declined EGD and esophageal manometry.   If she goes on a PPI, do you want her to follow up with you in 3 months and you can re-assess the need for EGD and esophageal manometry at that point?

## 2023-12-29 ENCOUNTER — Other Ambulatory Visit: Payer: Self-pay | Admitting: Nurse Practitioner

## 2023-12-29 DIAGNOSIS — M9901 Segmental and somatic dysfunction of cervical region: Secondary | ICD-10-CM | POA: Diagnosis not present

## 2023-12-29 DIAGNOSIS — M4723 Other spondylosis with radiculopathy, cervicothoracic region: Secondary | ICD-10-CM | POA: Diagnosis not present

## 2023-12-29 DIAGNOSIS — M546 Pain in thoracic spine: Secondary | ICD-10-CM | POA: Diagnosis not present

## 2023-12-29 DIAGNOSIS — M9902 Segmental and somatic dysfunction of thoracic region: Secondary | ICD-10-CM | POA: Diagnosis not present

## 2023-12-29 MED ORDER — PANTOPRAZOLE SODIUM 40 MG PO TBEC: 40.0000 mg | DELAYED_RELEASE_TABLET | Freq: Every day | ORAL | 0 refills | Status: DC

## 2023-12-30 ENCOUNTER — Ambulatory Visit: Payer: Medicare Other

## 2024-01-07 DIAGNOSIS — M9901 Segmental and somatic dysfunction of cervical region: Secondary | ICD-10-CM | POA: Diagnosis not present

## 2024-01-07 DIAGNOSIS — M546 Pain in thoracic spine: Secondary | ICD-10-CM | POA: Diagnosis not present

## 2024-01-07 DIAGNOSIS — M9902 Segmental and somatic dysfunction of thoracic region: Secondary | ICD-10-CM | POA: Diagnosis not present

## 2024-01-07 DIAGNOSIS — M4723 Other spondylosis with radiculopathy, cervicothoracic region: Secondary | ICD-10-CM | POA: Diagnosis not present

## 2024-01-15 DIAGNOSIS — M9902 Segmental and somatic dysfunction of thoracic region: Secondary | ICD-10-CM | POA: Diagnosis not present

## 2024-01-15 DIAGNOSIS — M9901 Segmental and somatic dysfunction of cervical region: Secondary | ICD-10-CM | POA: Diagnosis not present

## 2024-01-15 DIAGNOSIS — M4723 Other spondylosis with radiculopathy, cervicothoracic region: Secondary | ICD-10-CM | POA: Diagnosis not present

## 2024-01-15 DIAGNOSIS — M546 Pain in thoracic spine: Secondary | ICD-10-CM | POA: Diagnosis not present

## 2024-01-20 ENCOUNTER — Ambulatory Visit: Payer: Medicare Other

## 2024-01-20 VITALS — Ht 63.0 in | Wt 172.0 lb

## 2024-01-20 DIAGNOSIS — Z0000 Encounter for general adult medical examination without abnormal findings: Secondary | ICD-10-CM

## 2024-01-20 DIAGNOSIS — Z780 Asymptomatic menopausal state: Secondary | ICD-10-CM | POA: Diagnosis not present

## 2024-01-20 DIAGNOSIS — Z13820 Encounter for screening for osteoporosis: Secondary | ICD-10-CM

## 2024-01-20 NOTE — Progress Notes (Signed)
 Subjective:   Melissa Rodgers is a 72 y.o. female who presents for Medicare Annual (Subsequent) preventive examination.  Visit Complete: Virtual I connected with  Melissa Rodgers on 01/20/24 by a audio enabled telemedicine application and verified that I am speaking with the correct person using two identifiers.  Patient Location: Home  Provider Location: Office/Clinic  I discussed the limitations of evaluation and management by telemedicine. The patient expressed understanding and agreed to proceed.  Vital Signs: Because this visit was a virtual/telehealth visit, some criteria may be missing or patient reported. Any vitals not documented were not able to be obtained and vitals that have been documented are patient reported.  Patient Medicare AWV questionnaire was completed by the patient on 12/10/2023; I have confirmed that all information answered by patient is correct and no changes since this date.  Cardiac Risk Factors include: advanced age (>57men, >39 women);smoking/ tobacco exposure;dyslipidemia;hypertension;family history of premature cardiovascular disease;obesity (BMI >30kg/m2)     Objective:    Today's Vitals   01/20/24 0857  Weight: 172 lb (78 kg)  Height: 5\' 3"  (1.6 m)  PainSc: 4    Body mass index is 30.47 kg/m.     01/20/2024    9:11 AM 01/16/2023    8:24 AM 10/13/2022   10:53 PM 05/29/2021    2:35 PM 05/10/2021    5:00 PM 04/29/2021   11:03 AM 04/16/2021   11:06 AM  Advanced Directives  Does Patient Have a Medical Advance Directive? Yes No Yes No No No No  Type of Advance Directive Living will;Healthcare Power of [employer] Power of Attorney      Does patient want to make changes to medical advance directive? No - Patient declined  No - Patient declined      Copy of Healthcare Power of Attorney in Chart? No - copy requested        Would patient like information on creating a medical advance directive?  No - Patient declined  No - Patient declined No -  Patient declined  No - Patient declined    Current Medications (verified) Outpatient Encounter Medications as of 01/20/2024  Medication Sig   Acetaminophen (TYLENOL PO) Take 2 tablets by mouth daily as needed (pain).   amoxicillin (AMOXIL) 500 MG tablet Take 2,000 mg by mouth See admin instructions. Take 2000 mg by mouth 1 hour before dental appointment   bismuth subsalicylate (PEPTO BISMOL) 262 MG/15ML suspension Take 15 mLs by mouth as needed for indigestion.   calcium carbonate (TUMS - DOSED IN MG ELEMENTAL CALCIUM) 500 MG chewable tablet Chew 1,000 mg by mouth as needed for indigestion or heartburn.   cetirizine (ZYRTEC) 10 MG tablet Take 10 mg by mouth at bedtime.   clonazePAM (KLONOPIN) 0.5 MG tablet Take 0.5 tablets (0.25 mg total) by mouth 2 (two) times daily as needed for anxiety.   Cyanocobalamin (B-12 PO) Take 1 tablet by mouth 2 (two) times a week.   DIGESTIVE ENZYMES PO Take 1 capsule by mouth as needed.   docusate sodium (COLACE) 100 MG capsule Take 100 mg by mouth as needed.   ELIQUIS 5 MG TABS tablet TAKE 1 TABLET TWICE A DAY   flecainide (TAMBOCOR) 50 MG tablet Take 1 tablet (50 mg total) by mouth 2 (two) times daily.   FLUoxetine (PROZAC) 20 MG tablet Take 1 tablet (20 mg total) by mouth daily.   HYDROcodone-acetaminophen (NORCO/VICODIN) 5-325 MG tablet Take 1 tablet by mouth every 8 (eight) hours as needed for moderate pain.  lovastatin (MEVACOR) 10 MG tablet Take 1 tablet (10 mg total) by mouth at bedtime.   metoprolol succinate (TOPROL-XL) 25 MG 24 hr tablet TAKE 1 TABLET(25 MG) BY MOUTH DAILY (Patient taking differently: Take 25 mg by mouth daily.)   ondansetron (ZOFRAN-ODT) 4 MG disintegrating tablet Take 1 tablet (4 mg total) by mouth every 6 (six) hours as needed for nausea or vomiting.   pantoprazole (PROTONIX) 40 MG tablet Take 1 tablet (40 mg total) by mouth daily. To be taken 30 minutes before breakfast   Polyethyl Glycol-Propyl Glycol (SYSTANE OP) Place 1 drop  into both eyes in the morning and at bedtime.   ZINC OXIDE, TOPICAL, EX Apply 1 Application topically as needed (rash under stomach).   No facility-administered encounter medications on file as of 01/20/2024.    Allergies (verified) Crestor [rosuvastatin], Sulfa antibiotics, and Latex   History: Past Medical History:  Diagnosis Date   Allergy    seasonal   Chronic bilateral low back pain without sciatica 10/07/2016   DDD (degenerative disc disease), cervical 12/03/2020   Diverticulitis of sigmoid colon 08/01/2019   Dysrhythmia    Afib   Essential hypertension 09/09/2018   Family history of diabetes mellitus 06/09/2022   Fibromyalgia    Gastroesophageal reflux disease without esophagitis 11/06/2015   Generalized anxiety disorder with panic attacks 08/01/2019   Hyperlipidemia 09/09/2018   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   Lumbosacral spondylosis 10/07/2016   Mass of lower inner quadrant of right breast 07/11/2022   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 Rinaldo Ratel, [facility]. polyp 2 mm in the sigmoid colon, ( Polypectomy). Moderate diverticulosis of the descending colon and sigmoid colon. Lipoma in the ascending colon. Internal hemorrhoids.   ESOPHAGOGASTRODUODENOSCOPY     possibly last one was 2017. In [place]   HERNIA REPAIR     JOINT REPLACEMENT     MEDIAL PARTIAL KNEE REPLACEMENT Right 12/14/2014   NISSEN FUNDOPLICATION  02/21/2016   OOPHORECTOMY     PARTIAL KNEE ARTHROPLASTY Left 04/05/2014   TOTAL KNEE REVISION Left 05/10/2021   Procedure: TOTAL KNEE REVISION;  Surgeon: Jodi Geralds, MD;  Location: [facility];  Service: Orthopedics;   Laterality: Left;   Family History  Problem Relation Age of Onset   Diabetes Mother    Heart disease Mother    Anxiety disorder Mother    Arthritis Mother    Depression Mother    Cancer Father        small places of skin cancer to face, unsure of type   Alzheimer's disease Father        Symptom onset in late 12s   Heart disease Father    Hypertension Father    Atrial fibrillation Sister    Diabetes Sister    Hypertension Sister    Tremor Other        Significant maternal history of tremor; unknown regarding Parkinson's disease diagnoses   Colon cancer Neg Hx    Esophageal cancer Neg Hx    Stomach cancer Neg Hx    Rectal cancer Neg Hx    Social History   Socioeconomic History   Marital status: Married    Spouse name: Scharlene Gloss   Number of children: 0  Years of education: 64   Highest education level: Master's degree (e.g., MA, MS, MEng, MEd, MSW, MBA)  Occupational History   Occupation: Retired  Tobacco Use   Smoking status: Former    Current packs/day: 0.00    Average packs/day: 0.3 packs/day for 5.1 years (1.3 ttl pk-yrs)    Types: Cigarettes    Start date: 07/25/1978    Quit date: 08/18/1983    Years since quitting: 40.4    Passive exposure: Past   Smokeless tobacco: Never  Vaping Use   Vaping status: Never Used  Substance and Sexual Activity   Alcohol use: Yes    Alcohol/week: 1.0 - 3.0 standard drink of alcohol    Types: 1 - 3 Glasses of wine per week    Comment: 1-2 drinks a week, usually wine   Drug use: Never   Sexual activity: Not Currently    Partners: Male    Birth control/protection: None  Other Topics Concern   Not on file  Social History Narrative   Lives with her husband. Stays active and has a lot of hobbies such as gardening, cooking, reading and doing crafts.   Social Drivers of Corporate investment banker Strain: Low Risk  (01/20/2024)   Overall Financial Resource Strain (CARDIA)    Difficulty of Paying Living Expenses: Not hard at  all  Food Insecurity: No Food Insecurity (01/20/2024)   Hunger Vital Sign    Worried About Running Out of Food in the Last Year: Never true    Ran Out of Food in the Last Year: Never true  Transportation Needs: No Transportation Needs (01/20/2024)   PRAPARE - Administrator, Civil Service (Medical): No    Lack of Transportation (Non-Medical): No  Physical Activity: Sufficiently Active (01/20/2024)   Exercise Vital Sign    Days of Exercise per Week: 6 days    Minutes of Exercise per Session: 40 min  Recent Concern: Physical Activity - Insufficiently Active (12/10/2023)   Exercise Vital Sign    Days of Exercise per Week: 3 days    Minutes of Exercise per Session: 20 min  Stress: No Stress Concern Present (01/20/2024)   [employer] of Occupational Health - Occupational Stress Questionnaire    Feeling of Stress : Not at all  Social Connections: Moderately Isolated (01/20/2024)   Social Connection and Isolation Panel [NHANES]    Frequency of Communication with Friends and Family: Three times a week    Frequency of Social Gatherings with Friends and Family: More than three times a week    Attends Religious Services: Never    Database administrator or Organizations: No    Attends Engineer, structural: Never    Marital Status: Married    Tobacco Counseling Counseling given: Not Answered   Clinical Intake:  Pre-visit preparation completed: Yes  Pain : 0-10 Pain Score: 4  Pain Type: Chronic pain Pain Location: Shoulder Pain Orientation: Right Pain Radiating Towards: right arm Pain Descriptors / Indicators: Burning, Aching Pain Onset: More than a month ago Pain Frequency: Constant Pain Relieving Factors: Ice Effect of Pain on Daily Activities: yes, drawing or using the computer.  Pain Relieving Factors: Ice  BMI - recorded: 30.47 Nutritional Status: BMI > 30  Obese Nutritional Risks: None Diabetes: No  How often do you need to have someone help you  when you read instructions, pamphlets, or other written materials from your doctor or pharmacy?: 1 - Never What is the last grade level you  completed in school?: 21  Interpreter Needed?: No      Activities of Daily Living    01/20/2024    9:01 AM 01/16/2024    3:04 PM  In your present state of health, do you have any difficulty performing the following activities:  Hearing? 0 0  Vision? 0 0  Difficulty concentrating or making decisions? 0 0  Walking or climbing stairs? 0 0  Dressing or bathing? 0 0  Doing errands, shopping? 0 0  Preparing Food and eating ? N N  Using the Toilet? N N  In the past six months, have you accidently leaked urine? Y Y  Do you have problems with loss of bowel control? N N  Managing your Medications? N N  Managing your Finances? N N  Housekeeping or managing your Housekeeping? N N    Patient Care Team: Christen Butter, NP as PCP - General (Nurse Practitioner) Baldo Daub, MD as PCP - Cardiology (Cardiology) Monica Becton, MD as Consulting Physician (Family Medicine)  Indicate any recent Medical Services you may have received from other than [facility] providers in the past year (date may be approximate).     Assessment:   This is a routine wellness examination for Melissa Rodgers.  Hearing/Vision screen No results found.   Goals Addressed             This Visit's Progress    Activity and Exercise Increased       She would like to increase exercise.       Depression Screen    01/20/2024    9:10 AM 06/10/2023   10:52 AM 01/16/2023    8:26 AM 12/10/2022   11:42 AM 06/09/2022    3:34 PM 03/07/2022   10:09 AM 12/06/2021    1:08 PM  PHQ 2/9 Scores  PHQ - 2 Score 0 0 0 0 0 0 0  PHQ- 9 Score  3  3 4       Fall Risk    01/20/2024    9:12 AM 01/16/2024    3:04 PM 12/11/2023   10:38 AM 06/10/2023   10:51 AM 01/16/2023    8:26 AM  Fall Risk   Falls in the past year? 1 1 1  0 0  Number falls in past yr: 0 0 1 0 0  Injury with Fall? 0 0 0 0 0  Risk  for fall due to : History of fall(s)   No Fall Risks No Fall Risks  Follow up Falls evaluation completed  Falls evaluation completed Falls evaluation completed Falls evaluation completed    MEDICARE RISK AT HOME: Medicare Risk at Home Any stairs in or around the home?: Yes If so, are there any without handrails?: No Home free of loose throw rugs in walkways, pet beds, electrical cords, etc?: Yes Adequate lighting in your home to reduce risk of falls?: Yes Life alert?: No Use of a cane, walker or w/c?: No Grab bars in the bathroom?: No Shower chair or bench in shower?: No Elevated toilet seat or a handicapped toilet?: No  TIMED UP AND GO:  Was the test performed?  No    Cognitive Function:        01/20/2024    9:13 AM 01/16/2023    8:33 AM 04/16/2021   11:16 AM  6CIT Screen  What Year? 0 points 0 points 0 points  What month? 0 points 0 points 0 points  What time? 0 points 0 points 0 points  Count back from 20  0 points 0 points 0 points  Months in reverse 0 points 0 points 0 points  Repeat phrase 0 points 0 points 0 points  Total Score 0 points 0 points 0 points    Immunizations Immunization History  Administered Date(s) Administered   Fluad Quad(high Dose 65+) 08/29/2019   Influenza Whole 09/13/2014   Influenza, High Dose Seasonal PF 10/07/2016, 09/15/2017, 09/09/2018   Influenza,inj,Quad PF,6+ Mos 09/26/2015   Influenza,inj,quad, With Preservative 09/09/2013   Influenza-Unspecified 09/06/2020, 08/14/2022, 09/04/2023   Moderna SARS-COV2 Booster Vaccination 07/31/2021   PFIZER(Purple Top)SARS-COV-2 Vaccination 12/16/2019, 01/05/2020, 08/23/2020, 03/18/2021   PNEUMOCOCCAL CONJUGATE-20 06/10/2023   Pfizer Covid-19 Vaccine Bivalent Booster 61yrs & up 07/31/2021   Pneumococcal Conjugate-13 08/10/2015   Pneumococcal Polysaccharide-23 10/14/2013   Rsv, Bivalent, Protein Subunit Rsvpref,pf Verdis Frederickson) 11/27/2022   Tdap 03/20/2015   Zoster Recombinant(Shingrix) 08/04/2023    Zoster, Live 01/27/2013    TDAP status: Up to date  Flu Vaccine status: Up to date  Pneumococcal vaccine status: Up to date  Covid-19 vaccine status: Information provided on how to obtain vaccines.   Qualifies for Shingles Vaccine? Yes   Zostavax completed Yes   Shingrix Completed?: No.    Education has been provided regarding the importance of this vaccine. Patient has been advised to call insurance company to determine out of pocket expense if they have not yet received this vaccine. Advised may also receive vaccine at local pharmacy or Health Dept. Verbalized acceptance and understanding.  Screening Tests Health Maintenance  Topic Date Due   COVID-19 Vaccine (5 - 2024-25 season) 07/26/2023   Zoster Vaccines- Shingrix (2 of 2) 09/29/2023   Hepatitis C Screening  12/10/2024 (Originally 06/21/1969)   MAMMOGRAM  08/20/2024   Colonoscopy  10/03/2024   Medicare Annual Wellness (AWV)  01/19/2025   DTaP/Tdap/Td (2 - Td or Tdap) 03/19/2025   Pneumonia Vaccine 8+ Years old  Completed   INFLUENZA VACCINE  Completed   DEXA SCAN  Completed   HPV VACCINES  Aged Out    Health Maintenance  Health Maintenance Due  Topic Date Due   COVID-19 Vaccine (5 - 2024-25 season) 07/26/2023   Zoster Vaccines- Shingrix (2 of 2) 09/29/2023    Colorectal cancer screening: Type of screening: Colonoscopy. Completed 10/04/2019. Repeat every 5 years  Mammogram status: Completed 08/20/2022. Repeat every year  Bone Density status: Ordered 01/20/2024. Pt provided with contact info and advised to call to schedule appt.  Lung Cancer Screening: (Low Dose CT Chest recommended if Age 73-80 years, 20 pack-year currently smoking OR have quit w/in 15years.) does not qualify.   Lung Cancer Screening Referral: n/a  Additional Screening:  Hepatitis C Screening: does qualify; Completed not completed  Vision Screening: Recommended annual ophthalmology exams for early detection of glaucoma and other disorders of  the eye. Is the patient up to date with their annual eye exam?  No  Who is the provider or what is the name of the office in which the patient attends annual eye exams? [facility] If pt is not established with a provider, would they like to be referred to a provider to establish care?  N/a .   Dental Screening: Recommended annual dental exams for proper oral hygiene   Community Resource Referral / Chronic Care Management: CRR required this visit?  No   CCM required this visit?  No     Plan:     I have personally reviewed and noted the following in the patient's chart:   Medical and social history Use of alcohol,  tobacco or illicit drugs  Current medications and supplements including opioid prescriptions. Patient is currently taking opioid prescriptions. Information provided to patient regarding non-opioid alternatives. Patient advised to discuss non-opioid treatment plan with their provider. Functional ability and status Nutritional status Physical activity Advanced directives List of other physicians Hospitalizations, surgeries, and ER visits in previous 12 months Vitals Screenings to include cognitive, depression, and falls Referrals and appointments  In addition, I have reviewed and discussed with patient certain preventive protocols, quality metrics, and best practice recommendations. A written personalized care plan for preventive services as well as general preventive health recommendations were provided to patient.     Esmond Harps, [place]   01/20/2024   After Visit Summary: (MyChart) Due to this being a telephonic visit, the after visit summary with patients personalized plan was offered to patient via MyChart   Nurse Notes:   Melissa Rodgers is a 73 y.o. female patient of Christen Butter, NP who had a Medicare Annual Wellness Visit today via telephone. Melissa Rodgers is Retired and lives with their spouse. She does not have any children. She reports that she is socially  active and does interact with friends/family regularly. She is minimally physically active and enjoys gardening, cooking, sewing, reading, and doing crafts.   Ordered bone density.   Recommend 2nd shingles vaccine.

## 2024-01-20 NOTE — Patient Instructions (Signed)
 Melissa Rodgers , Thank you for taking time to come for your Medicare Wellness Visit. I appreciate your ongoing commitment to your health goals. Please review the following plan we discussed and let me know if I can assist you in the future.   These are the goals we discussed:  Goals       Activity and Exercise Increased      She would like to increase exercise.       Patient Stated (pt-stated)      04/16/2021 AWV Goal: Exercise for General Health  Patient will verbalize understanding of the benefits of increased physical activity: Exercising regularly is important. It will improve your overall fitness, flexibility, and endurance. Regular exercise also will improve your overall health. It can help you control your weight, reduce stress, and improve your bone density. Over the next year, patient will increase physical activity as tolerated with a goal of at least 150 minutes of moderate physical activity per week.  You can tell that you are exercising at a moderate intensity if your heart starts beating faster and you start breathing faster but can still hold a conversation. Moderate-intensity exercise ideas include: Walking 1 mile (1.6 km) in about 15 minutes Biking Hiking Golfing Dancing Water aerobics Patient will verbalize understanding of everyday activities that increase physical activity by providing examples like the following: Yard work, such as: Insurance underwriter Gardening Washing windows or floors Patient will be able to explain general safety guidelines for exercising:  Before you start a new exercise program, talk with your health care provider. Do not exercise so much that you hurt yourself, feel dizzy, or get very short of breath. Wear comfortable clothes and wear shoes with good support. Drink plenty of water while you exercise to prevent dehydration or heat stroke. Work out until your  breathing and your heartbeat get faster.       Patient Stated (pt-stated)      01/16/2023 AWV Goal: Exercise for General Health  Patient will verbalize understanding of the benefits of increased physical activity: Exercising regularly is important. It will improve your overall fitness, flexibility, and endurance. Regular exercise also will improve your overall health. It can help you control your weight, reduce stress, and improve your bone density. Over the next year, patient will increase physical activity as tolerated with a goal of at least 150 minutes of moderate physical activity per week.  You can tell that you are exercising at a moderate intensity if your heart starts beating faster and you start breathing faster but can still hold a conversation. Moderate-intensity exercise ideas include: Walking 1 mile (1.6 km) in about 15 minutes Biking Hiking Golfing Dancing Water aerobics Patient will verbalize understanding of everyday activities that increase physical activity by providing examples like the following: Yard work, such as: Insurance underwriter Gardening Washing windows or floors Patient will be able to explain general safety guidelines for exercising:  Before you start a new exercise program, talk with your health care provider. Do not exercise so much that you hurt yourself, feel dizzy, or get very short of breath. Wear comfortable clothes and wear shoes with good support. Drink plenty of water while you exercise to prevent dehydration or heat stroke. Work out until your breathing and your heartbeat get faster.         This is a  list of the screening recommended for you and due dates:  Health Maintenance  Topic Date Due   COVID-19 Vaccine (8 - 2024-25 season) 09/17/2023   Zoster (Shingles) Vaccine (2 of 2) 09/29/2023   Hepatitis C Screening  12/10/2024*   Mammogram  08/20/2024    Colon Cancer Screening  10/03/2024   Medicare Annual Wellness Visit  01/19/2025   DTaP/Tdap/Td vaccine (2 - Td or Tdap) 03/19/2025   Pneumonia Vaccine  Completed   Flu Shot  Completed   DEXA scan (bone density measurement)  Completed   HPV Vaccine  Aged Out  *Topic was postponed. The date shown is not the original due date.

## 2024-01-21 DIAGNOSIS — M9901 Segmental and somatic dysfunction of cervical region: Secondary | ICD-10-CM | POA: Diagnosis not present

## 2024-01-21 DIAGNOSIS — M546 Pain in thoracic spine: Secondary | ICD-10-CM | POA: Diagnosis not present

## 2024-01-21 DIAGNOSIS — M4723 Other spondylosis with radiculopathy, cervicothoracic region: Secondary | ICD-10-CM | POA: Diagnosis not present

## 2024-01-21 DIAGNOSIS — M9902 Segmental and somatic dysfunction of thoracic region: Secondary | ICD-10-CM | POA: Diagnosis not present

## 2024-01-27 ENCOUNTER — Other Ambulatory Visit: Payer: Self-pay | Admitting: Cardiology

## 2024-01-27 NOTE — Telephone Encounter (Signed)
 Prescription sent to pharmacy.

## 2024-01-28 DIAGNOSIS — M546 Pain in thoracic spine: Secondary | ICD-10-CM | POA: Diagnosis not present

## 2024-01-28 DIAGNOSIS — M9901 Segmental and somatic dysfunction of cervical region: Secondary | ICD-10-CM | POA: Diagnosis not present

## 2024-01-28 DIAGNOSIS — M4723 Other spondylosis with radiculopathy, cervicothoracic region: Secondary | ICD-10-CM | POA: Diagnosis not present

## 2024-01-28 DIAGNOSIS — M9902 Segmental and somatic dysfunction of thoracic region: Secondary | ICD-10-CM | POA: Diagnosis not present

## 2024-01-29 ENCOUNTER — Encounter: Payer: Self-pay | Admitting: Sports Medicine

## 2024-01-29 ENCOUNTER — Other Ambulatory Visit (INDEPENDENT_AMBULATORY_CARE_PROVIDER_SITE_OTHER): Payer: Self-pay

## 2024-01-29 ENCOUNTER — Ambulatory Visit: Admitting: Sports Medicine

## 2024-01-29 ENCOUNTER — Ambulatory Visit

## 2024-01-29 DIAGNOSIS — G8929 Other chronic pain: Secondary | ICD-10-CM | POA: Diagnosis not present

## 2024-01-29 DIAGNOSIS — M75101 Unspecified rotator cuff tear or rupture of right shoulder, not specified as traumatic: Secondary | ICD-10-CM | POA: Insufficient documentation

## 2024-01-29 DIAGNOSIS — M47817 Spondylosis without myelopathy or radiculopathy, lumbosacral region: Secondary | ICD-10-CM | POA: Diagnosis not present

## 2024-01-29 DIAGNOSIS — M25511 Pain in right shoulder: Secondary | ICD-10-CM

## 2024-01-29 HISTORY — DX: Unspecified rotator cuff tear or rupture of right shoulder, not specified as traumatic: M75.101

## 2024-01-29 MED ORDER — TRIAMCINOLONE ACETONIDE 40 MG/ML IJ SUSP
40.0000 mg | Freq: Once | INTRAMUSCULAR | Status: AC
  Administered 2024-01-29: 40 mg via INTRAMUSCULAR

## 2024-01-29 MED ORDER — HYDROCODONE-ACETAMINOPHEN 5-325 MG PO TABS: 1.0000 | ORAL_TABLET | Freq: Three times a day (TID) | ORAL | 0 refills | Status: DC | PRN

## 2024-01-29 NOTE — Assessment & Plan Note (Signed)
 Very pleasant 73 year old female, history of left-sided shoulder bursitis that responded well to an injection several years ago. Now having increasing pain right shoulder localized over the deltoid, worse with abduction, she also has some pain radiating down to the thumb. Moderate neck pain On exam she has very strongly positive impingement signs. Weakness to abduction all suspicious for rotator cuff disease. Due to severity of pain we will do an injection today, x-rays, patient declines physical therapy. She does have a diltiazem to take with steroid injections to prevent going back into atrial fibrillation. Return to see me in 6 weeks.

## 2024-01-29 NOTE — Addendum Note (Signed)
 Addended by: Samule Dry on: 01/29/2024 04:50 PM   Modules accepted: Orders

## 2024-01-29 NOTE — Progress Notes (Signed)
    Procedures performed today:    Procedure: Real-time Ultrasound Guided injection of the right subacromial bursa Device: Samsung HS60  Verbal informed consent obtained.  Time-out conducted.  Noted no overlying erythema, induration, or other signs of local infection.  Skin prepped in a sterile fashion.  Local anesthesia: Topical Ethyl chloride.  With sterile technique and under real time ultrasound guidance: Noted subacromial bursitis and full-thickness anterior supraspinatus tearing, 1 cc Kenalog 40, 1 cc lidocaine, 1 cc bupivacaine injected easily Completed without difficulty  Advised to call if fevers/chills, erythema, induration, drainage, or persistent bleeding.  Images permanently stored and available for review in PACS.  Impression: Technically successful ultrasound guided injection.  Independent interpretation of notes and tests performed by another provider:   None.  Brief History, Exam, Impression, and Recommendations:    Chronic right shoulder pain Very pleasant 73 year old female, history of left-sided shoulder bursitis that responded well to an injection several years ago. Now having increasing pain right shoulder localized over the deltoid, worse with abduction, she also has some pain radiating down to the thumb. Moderate neck pain On exam she has very strongly positive impingement signs. Weakness to abduction all suspicious for rotator cuff disease. Due to severity of pain we will do an injection today, x-rays, patient declines physical therapy. She does have a diltiazem to take with steroid injections to prevent going back into atrial fibrillation. Return to see me in 6 weeks.    ____________________________________________ Ihor Austin. Benjamin Stain, M.D., ABFM., CAQSM., AME. Primary Care and Sports Medicine [facility]  Adjunct Professor of Family Medicine  [employer] [facility] [employer] [facility] [employer]  Restaurant manager, fast food

## 2024-02-04 DIAGNOSIS — M4723 Other spondylosis with radiculopathy, cervicothoracic region: Secondary | ICD-10-CM | POA: Diagnosis not present

## 2024-02-04 DIAGNOSIS — M546 Pain in thoracic spine: Secondary | ICD-10-CM | POA: Diagnosis not present

## 2024-02-04 DIAGNOSIS — M9901 Segmental and somatic dysfunction of cervical region: Secondary | ICD-10-CM | POA: Diagnosis not present

## 2024-02-04 DIAGNOSIS — M9902 Segmental and somatic dysfunction of thoracic region: Secondary | ICD-10-CM | POA: Diagnosis not present

## 2024-02-05 ENCOUNTER — Encounter: Payer: Self-pay | Admitting: Sports Medicine

## 2024-02-11 DIAGNOSIS — M9902 Segmental and somatic dysfunction of thoracic region: Secondary | ICD-10-CM | POA: Diagnosis not present

## 2024-02-11 DIAGNOSIS — M546 Pain in thoracic spine: Secondary | ICD-10-CM | POA: Diagnosis not present

## 2024-02-11 DIAGNOSIS — M4723 Other spondylosis with radiculopathy, cervicothoracic region: Secondary | ICD-10-CM | POA: Diagnosis not present

## 2024-02-11 DIAGNOSIS — M9901 Segmental and somatic dysfunction of cervical region: Secondary | ICD-10-CM | POA: Diagnosis not present

## 2024-02-14 ENCOUNTER — Other Ambulatory Visit: Payer: Self-pay | Admitting: Medical-Surgical

## 2024-02-16 ENCOUNTER — Encounter: Payer: Self-pay | Admitting: Sports Medicine

## 2024-02-16 ENCOUNTER — Encounter: Payer: Self-pay | Admitting: Medical-Surgical

## 2024-02-16 MED ORDER — GABAPENTIN 100 MG PO CAPS: 200.0000 mg | ORAL_CAPSULE | Freq: Three times a day (TID) | ORAL | 2 refills | Status: DC

## 2024-02-17 ENCOUNTER — Encounter: Payer: Self-pay | Admitting: Medical-Surgical

## 2024-02-17 ENCOUNTER — Ambulatory Visit

## 2024-02-17 ENCOUNTER — Ambulatory Visit (INDEPENDENT_AMBULATORY_CARE_PROVIDER_SITE_OTHER): Payer: Medicare Other

## 2024-02-17 DIAGNOSIS — Z780 Asymptomatic menopausal state: Secondary | ICD-10-CM

## 2024-02-17 DIAGNOSIS — Z1231 Encounter for screening mammogram for malignant neoplasm of breast: Secondary | ICD-10-CM | POA: Diagnosis not present

## 2024-02-17 DIAGNOSIS — Z13820 Encounter for screening for osteoporosis: Secondary | ICD-10-CM

## 2024-02-18 ENCOUNTER — Encounter: Payer: Self-pay | Admitting: Medical-Surgical

## 2024-02-18 DIAGNOSIS — M9902 Segmental and somatic dysfunction of thoracic region: Secondary | ICD-10-CM | POA: Diagnosis not present

## 2024-02-18 DIAGNOSIS — M4723 Other spondylosis with radiculopathy, cervicothoracic region: Secondary | ICD-10-CM | POA: Diagnosis not present

## 2024-02-18 DIAGNOSIS — M546 Pain in thoracic spine: Secondary | ICD-10-CM | POA: Diagnosis not present

## 2024-02-18 DIAGNOSIS — M9901 Segmental and somatic dysfunction of cervical region: Secondary | ICD-10-CM | POA: Diagnosis not present

## 2024-02-25 DIAGNOSIS — M4723 Other spondylosis with radiculopathy, cervicothoracic region: Secondary | ICD-10-CM | POA: Diagnosis not present

## 2024-02-25 DIAGNOSIS — M9901 Segmental and somatic dysfunction of cervical region: Secondary | ICD-10-CM | POA: Diagnosis not present

## 2024-02-25 DIAGNOSIS — M9902 Segmental and somatic dysfunction of thoracic region: Secondary | ICD-10-CM | POA: Diagnosis not present

## 2024-02-25 DIAGNOSIS — M546 Pain in thoracic spine: Secondary | ICD-10-CM | POA: Diagnosis not present

## 2024-02-27 ENCOUNTER — Other Ambulatory Visit: Payer: Self-pay | Admitting: Cardiology

## 2024-02-29 NOTE — Telephone Encounter (Signed)
 Prescription sent to pharmacy.

## 2024-03-03 DIAGNOSIS — M9901 Segmental and somatic dysfunction of cervical region: Secondary | ICD-10-CM | POA: Diagnosis not present

## 2024-03-03 DIAGNOSIS — M4723 Other spondylosis with radiculopathy, cervicothoracic region: Secondary | ICD-10-CM | POA: Diagnosis not present

## 2024-03-03 DIAGNOSIS — M546 Pain in thoracic spine: Secondary | ICD-10-CM | POA: Diagnosis not present

## 2024-03-03 DIAGNOSIS — M9902 Segmental and somatic dysfunction of thoracic region: Secondary | ICD-10-CM | POA: Diagnosis not present

## 2024-03-08 DIAGNOSIS — H43813 Vitreous degeneration, bilateral: Secondary | ICD-10-CM | POA: Diagnosis not present

## 2024-03-08 DIAGNOSIS — H25813 Combined forms of age-related cataract, bilateral: Secondary | ICD-10-CM | POA: Diagnosis not present

## 2024-03-08 DIAGNOSIS — H524 Presbyopia: Secondary | ICD-10-CM | POA: Diagnosis not present

## 2024-03-08 DIAGNOSIS — H04123 Dry eye syndrome of bilateral lacrimal glands: Secondary | ICD-10-CM | POA: Diagnosis not present

## 2024-03-08 DIAGNOSIS — H5203 Hypermetropia, bilateral: Secondary | ICD-10-CM | POA: Diagnosis not present

## 2024-03-10 DIAGNOSIS — M9901 Segmental and somatic dysfunction of cervical region: Secondary | ICD-10-CM | POA: Diagnosis not present

## 2024-03-10 DIAGNOSIS — M4723 Other spondylosis with radiculopathy, cervicothoracic region: Secondary | ICD-10-CM | POA: Diagnosis not present

## 2024-03-10 DIAGNOSIS — M546 Pain in thoracic spine: Secondary | ICD-10-CM | POA: Diagnosis not present

## 2024-03-10 DIAGNOSIS — M9902 Segmental and somatic dysfunction of thoracic region: Secondary | ICD-10-CM | POA: Diagnosis not present

## 2024-03-14 ENCOUNTER — Encounter: Payer: Self-pay | Admitting: Sports Medicine

## 2024-03-14 ENCOUNTER — Ambulatory Visit (INDEPENDENT_AMBULATORY_CARE_PROVIDER_SITE_OTHER): Admitting: Sports Medicine

## 2024-03-14 ENCOUNTER — Other Ambulatory Visit (INDEPENDENT_AMBULATORY_CARE_PROVIDER_SITE_OTHER)

## 2024-03-14 DIAGNOSIS — M75121 Complete rotator cuff tear or rupture of right shoulder, not specified as traumatic: Secondary | ICD-10-CM | POA: Diagnosis not present

## 2024-03-14 DIAGNOSIS — M2550 Pain in unspecified joint: Secondary | ICD-10-CM

## 2024-03-14 DIAGNOSIS — M25511 Pain in right shoulder: Secondary | ICD-10-CM

## 2024-03-14 DIAGNOSIS — G8929 Other chronic pain: Secondary | ICD-10-CM

## 2024-03-14 MED ORDER — TRIAMCINOLONE ACETONIDE 40 MG/ML IJ SUSP
40.0000 mg | Freq: Once | INTRAMUSCULAR | Status: AC
  Administered 2024-03-14: 40 mg via INTRAMUSCULAR

## 2024-03-14 NOTE — Addendum Note (Signed)
 Addended by: OLIVA-AVELLANEDA, Chanin Frumkin L on: 03/14/2024 03:16 PM   Modules accepted: Orders

## 2024-03-14 NOTE — Progress Notes (Addendum)
    Procedures performed today:    Procedure: Real-time Ultrasound Guided injection of the right biceps sheath Device: Samsung HS60  Verbal informed consent obtained.  Time-out conducted.  Noted no overlying erythema, induration, or other signs of local infection.  Skin prepped in a sterile fashion.  Local anesthesia: Topical Ethyl chloride.  With sterile technique and under real time ultrasound guidance: Some split tearing, 1 cc Kenalog  40, 1 cc lidocaine, 1 cc bupivacaine  injected easily Completed without difficulty  Advised to call if fevers/chills, erythema, induration, drainage, or persistent bleeding.  Images permanently stored and available for review in PACS.  Impression: Technically successful ultrasound guided injection.  Independent interpretation of notes and tests performed by another provider:   None.  Brief History, Exam, Impression, and Recommendations:    Rotator cuff tear, right Chronic multifactorial left shoulder pain, we did a subacromial injection about a month and a half ago, had some relief but then with recurrence of pain, I did note full-thickness rotator cuff tearing on the ultrasound. Today symptoms are more bicipital with a positive speeds test. We did a biceps sheath injection today, she did have some partial immediate relief, I do think we are nearing surgical intervention so I have ordered a shoulder MRI. She will continue her home conditioning, I think we are close to getting a surgical consult. Also with some polyarthralgias, we will going do some rheumatoid testing.  Update, no better, fullthickness retracted cuff tearing, referral to Dr. Yvonne Hering.  Polyarthralgia with elevated ESR Abia does have a history of fibromyalgia, she also has multiple joint aches and pains, we did some rheumatoid testing, most of the tests were negative however her ESR did come back over 50, combined with widespread aches and pains as well as her age this is suggestive of  polymyalgia rheumatica. This is different than rheumatoid arthritis. We can discuss this at her follow-up visit however a low-dose daily prednisone  would be the treatment of choice.    ____________________________________________ Melissa Rodgers. Sandy Crumb, M.D., ABFM., CAQSM., AME. Primary Care and Sports Medicine [facility]  Adjunct Professor of [facility] Medicine  [employer]  Restaurant manager, fast food

## 2024-03-14 NOTE — Assessment & Plan Note (Addendum)
 Chronic multifactorial left shoulder pain, we did a subacromial injection about a month and a half ago, had some relief but then with recurrence of pain, I did note full-thickness rotator cuff tearing on the ultrasound. Today symptoms are more bicipital with a positive speeds test. We did a biceps sheath injection today, she did have some partial immediate relief, I do think we are nearing surgical intervention so I have ordered a shoulder MRI. She will continue her home conditioning, I think we are close to getting a surgical consult. Also with some polyarthralgias, we will going do some rheumatoid testing.  Update, no better, fullthickness retracted cuff tearing, referral to Dr. Yvonne Hering.

## 2024-03-15 ENCOUNTER — Ambulatory Visit

## 2024-03-15 ENCOUNTER — Encounter (INDEPENDENT_AMBULATORY_CARE_PROVIDER_SITE_OTHER): Payer: Self-pay | Admitting: Sports Medicine

## 2024-03-15 DIAGNOSIS — M25511 Pain in right shoulder: Secondary | ICD-10-CM

## 2024-03-15 DIAGNOSIS — M2550 Pain in unspecified joint: Secondary | ICD-10-CM

## 2024-03-15 DIAGNOSIS — M75121 Complete rotator cuff tear or rupture of right shoulder, not specified as traumatic: Secondary | ICD-10-CM | POA: Diagnosis not present

## 2024-03-15 DIAGNOSIS — M948X1 Other specified disorders of cartilage, shoulder: Secondary | ICD-10-CM | POA: Diagnosis not present

## 2024-03-15 DIAGNOSIS — M19011 Primary osteoarthritis, right shoulder: Secondary | ICD-10-CM | POA: Diagnosis not present

## 2024-03-15 DIAGNOSIS — G8929 Other chronic pain: Secondary | ICD-10-CM

## 2024-03-15 DIAGNOSIS — M7581 Other shoulder lesions, right shoulder: Secondary | ICD-10-CM | POA: Diagnosis not present

## 2024-03-15 HISTORY — DX: Pain in unspecified joint: M25.50

## 2024-03-15 NOTE — Assessment & Plan Note (Signed)
 Melissa Rodgers does have a history of fibromyalgia, she also has multiple joint aches and pains, we did some rheumatoid testing, most of the tests were negative however her ESR did come back over 50, combined with widespread aches and pains as well as her age this is suggestive of polymyalgia rheumatica. This is different than rheumatoid arthritis. We can discuss this at her follow-up visit however a low-dose daily prednisone  would be the treatment of choice.

## 2024-03-16 LAB — CBC WITH DIFFERENTIAL/PLATELET
Basophils Absolute: 0.1 x10E3/uL (ref 0.0–0.2)
Basos: 1 %
EOS (ABSOLUTE): 0.2 x10E3/uL (ref 0.0–0.4)
Eos: 3 %
Hematocrit: 42.8 % (ref 34.0–46.6)
Hemoglobin: 13.9 g/dL (ref 11.1–15.9)
Immature Grans (Abs): 0 10*3/uL (ref 0.0–0.1)
Immature Granulocytes: 0 %
Lymphocytes Absolute: 2.2 10*3/uL (ref 0.7–3.1)
Lymphs: 30 %
MCH: 29 pg (ref 26.6–33.0)
MCHC: 32.5 g/dL (ref 31.5–35.7)
MCV: 89 fL (ref 79–97)
Monocytes Absolute: 0.7 x10E3/uL (ref 0.1–0.9)
Monocytes: 9 %
Neutrophils Absolute: 4.1 10*3/uL (ref 1.4–7.0)
Neutrophils: 57 %
Platelets: 283 10*3/uL (ref 150–450)
RBC: 4.8 x10E6/uL (ref 3.77–5.28)
RDW: 13.6 % (ref 11.7–15.4)
WBC: 7.2 x10E3/uL (ref 3.4–10.8)

## 2024-03-16 LAB — SYSTEMIC LUPUS PROFILE A
Chromatin Ab SerPl-aCnc: 0.4 AI (ref 0.0–0.9)
ENA RNP Ab: <0.2 AI (ref 0.0–0.9)
ENA SM Ab Ser-aCnc: <0.2 AI (ref 0.0–0.9)
ENA SSA (RO) Ab: <0.2 AI (ref 0.0–0.9)
ENA SSB (LA) Ab: <0.2 AI (ref 0.0–0.9)
Rheumatoid fact SerPl-aCnc: <10 [IU]/mL (ref <14.0)
dsDNA Ab: <1 [IU]/mL (ref 0–9)

## 2024-03-16 LAB — COMPREHENSIVE METABOLIC PANEL WITH GFR
ALT: 18 IU/L (ref 0–32)
AST: 19 IU/L (ref 0–40)
Albumin: 4.5 g/dL (ref 3.8–4.8)
Alkaline Phosphatase: 94 IU/L (ref 44–121)
BUN/Creatinine Ratio: 23 (ref 12–28)
BUN: 13 mg/dL (ref 8–27)
Bilirubin Total: 0.2 mg/dL (ref 0.0–1.2)
CO2: 22 mmol/L (ref 20–29)
Calcium: 9.3 mg/dL (ref 8.7–10.3)
Chloride: 105 mmol/L (ref 96–106)
Creatinine, Ser: 0.57 mg/dL (ref 0.57–1.00)
Globulin, Total: 2.2 g/dL (ref 1.5–4.5)
Glucose: 72 mg/dL (ref 70–99)
Potassium: 4.1 mmol/L (ref 3.5–5.2)
Sodium: 141 mmol/L (ref 134–144)
Total Protein: 6.7 g/dL (ref 6.0–8.5)
eGFR: 96 mL/min/{1.73_m2} (ref >59)

## 2024-03-16 LAB — RHEUMATOID ARTHRITIS PROFILE: Cyclic Citrullin Peptide Ab: 6 U (ref 0–19)

## 2024-03-16 LAB — CK: Total CK: 90 U/L (ref 32–182)

## 2024-03-16 LAB — URIC ACID: Uric Acid: 5.1 mg/dL (ref 3.1–7.9)

## 2024-03-16 LAB — SEDIMENTATION RATE: Sed Rate: 50 mm/h — ABNORMAL HIGH (ref 0–40)

## 2024-03-17 DIAGNOSIS — M546 Pain in thoracic spine: Secondary | ICD-10-CM | POA: Diagnosis not present

## 2024-03-17 DIAGNOSIS — M9902 Segmental and somatic dysfunction of thoracic region: Secondary | ICD-10-CM | POA: Diagnosis not present

## 2024-03-17 DIAGNOSIS — M9901 Segmental and somatic dysfunction of cervical region: Secondary | ICD-10-CM | POA: Diagnosis not present

## 2024-03-17 DIAGNOSIS — M4723 Other spondylosis with radiculopathy, cervicothoracic region: Secondary | ICD-10-CM | POA: Diagnosis not present

## 2024-03-21 ENCOUNTER — Other Ambulatory Visit: Payer: Self-pay | Admitting: Cardiology

## 2024-03-21 NOTE — Telephone Encounter (Signed)
 Prescription refill request for Eliquis  received. Indication: PAF Last office visit: 09/16/23  Johana Musty MD Scr: 0.57 on 03/14/24  Epic Age: 73 Weight: 79.8kg  Based on above findings Eliquis  5mg  twice daily is the appropriate dose.  Refill approved.

## 2024-03-22 NOTE — Addendum Note (Signed)
 Addended by: Gean Keels on: 03/22/2024 03:39 PM   Modules accepted: Orders

## 2024-03-22 NOTE — Telephone Encounter (Signed)

## 2024-03-23 NOTE — Telephone Encounter (Signed)
 Last read by Booker Buys at 7:23PM on 03/22/2024.

## 2024-03-24 DIAGNOSIS — M9901 Segmental and somatic dysfunction of cervical region: Secondary | ICD-10-CM | POA: Diagnosis not present

## 2024-03-24 DIAGNOSIS — M4723 Other spondylosis with radiculopathy, cervicothoracic region: Secondary | ICD-10-CM | POA: Diagnosis not present

## 2024-03-24 DIAGNOSIS — M9902 Segmental and somatic dysfunction of thoracic region: Secondary | ICD-10-CM | POA: Diagnosis not present

## 2024-03-24 DIAGNOSIS — M546 Pain in thoracic spine: Secondary | ICD-10-CM | POA: Diagnosis not present

## 2024-03-25 ENCOUNTER — Ambulatory Visit: Payer: Medicare Other | Admitting: Gastroenterology

## 2024-03-26 ENCOUNTER — Other Ambulatory Visit: Payer: Self-pay | Admitting: Nurse Practitioner

## 2024-03-29 DIAGNOSIS — M4723 Other spondylosis with radiculopathy, cervicothoracic region: Secondary | ICD-10-CM | POA: Diagnosis not present

## 2024-03-29 DIAGNOSIS — M9901 Segmental and somatic dysfunction of cervical region: Secondary | ICD-10-CM | POA: Diagnosis not present

## 2024-03-29 DIAGNOSIS — M546 Pain in thoracic spine: Secondary | ICD-10-CM | POA: Diagnosis not present

## 2024-03-29 DIAGNOSIS — M9902 Segmental and somatic dysfunction of thoracic region: Secondary | ICD-10-CM | POA: Diagnosis not present

## 2024-03-31 NOTE — Addendum Note (Signed)
 Addended by: Gean Keels on: 03/31/2024 07:01 AM   Modules accepted: Orders

## 2024-04-08 DIAGNOSIS — M4723 Other spondylosis with radiculopathy, cervicothoracic region: Secondary | ICD-10-CM | POA: Diagnosis not present

## 2024-04-08 DIAGNOSIS — M546 Pain in thoracic spine: Secondary | ICD-10-CM | POA: Diagnosis not present

## 2024-04-08 DIAGNOSIS — M9902 Segmental and somatic dysfunction of thoracic region: Secondary | ICD-10-CM | POA: Diagnosis not present

## 2024-04-08 DIAGNOSIS — M9901 Segmental and somatic dysfunction of cervical region: Secondary | ICD-10-CM | POA: Diagnosis not present

## 2024-04-09 ENCOUNTER — Other Ambulatory Visit: Payer: Self-pay | Admitting: Nurse Practitioner

## 2024-04-11 ENCOUNTER — Ambulatory Visit: Payer: Medicare Other | Admitting: Gastroenterology

## 2024-04-11 ENCOUNTER — Encounter: Payer: Self-pay | Admitting: Gastroenterology

## 2024-04-11 VITALS — BP 126/84 | HR 61 | Ht 62.25 in | Wt 180.5 lb

## 2024-04-11 DIAGNOSIS — K224 Dyskinesia of esophagus: Secondary | ICD-10-CM

## 2024-04-11 DIAGNOSIS — K219 Gastro-esophageal reflux disease without esophagitis: Secondary | ICD-10-CM

## 2024-04-11 DIAGNOSIS — Z860100 Personal history of colon polyps, unspecified: Secondary | ICD-10-CM

## 2024-04-11 DIAGNOSIS — Z860109 Personal history of other colon polyps: Secondary | ICD-10-CM

## 2024-04-11 DIAGNOSIS — K581 Irritable bowel syndrome with constipation: Secondary | ICD-10-CM | POA: Diagnosis not present

## 2024-04-11 DIAGNOSIS — Z860102 Personal history of hyperplastic colon polyps: Secondary | ICD-10-CM

## 2024-04-11 DIAGNOSIS — K5730 Diverticulosis of large intestine without perforation or abscess without bleeding: Secondary | ICD-10-CM

## 2024-04-11 NOTE — Patient Instructions (Signed)
 _______________________________________________________  If your blood pressure at your visit was 140/90 or greater, please contact your primary care physician to follow up on this. _______________________________________________________  If you are age 74 or older, your body mass index should be between 23-30. Your Body mass index is 32.75 kg/m. If this is out of the aforementioned range listed, please consider follow up with your Primary Care Provider. ________________________________________________________  The [facility] providers would like to encourage you to use MYCHART to communicate with providers for non-urgent requests or questions.  Due to long hold times on the telephone, sending your provider a message by [facility] may be a faster and more efficient way to get a response.  Please allow 48 business hours for a response.  Please remember that this is for non-urgent requests.  _______________________________________________________  Elene Griffes will need to follow up in one year.  We will contact you to schedule this appointment.  It was a pleasure to see you today!  [name], D.O.

## 2024-04-11 NOTE — Progress Notes (Signed)
 Chief Complaint:    Esophageal spasm  GI History: Melissa Rodgers is a 73 y.o. female with a history of paroxysmal atrial fibrillation (flecainide , Eliquis ), hypertension, GERD s/p Nissen fundoplication 2017, low back pain, who follows in the GI Clinic for diverticular disease, IBS, and GERD.  Previously followed with [facility], established care with me in 02/2020.   1) Diverticulosis: Patient with episode of diverticulitis in 07/2019 -CT 07/2019: Severe, acute sigmoid diverticulitis; treated with antibiotics -Colonoscopy 09/2019 ([facility]): moderate diverticulosis of the descending and sigmoid colon, lipoma, internal hemorrhoids, and 2 mm inflammatory polyp of the sigmoid colon, 1.5 cm ascending colon lipoma -CT 12/05/2019: Normal, resolution of sigmoid diverticulitis.  Treated with 14-day course of Augmentin  for LLQ tenderness -CT 01/17/2020: Mild diverticulosis without diverticulitis, stable uterine fibroid - 04/2022: Distal sigmoid diverticulitis w/o abscess diagnosed on CT during ER evaluation in [place], and wife.  Treated with Augmentin    2) IBS-C:  Intermittent abdominal cramping and constipation, with occasional overflow diarrhea.  Improved with MiraLAX  and Benefiber. Uses Gas-X for gas/bloating.  History of epigastric pain resolved with clonazepam  as needed. - 09/17/2021: Recommended trial Linzess  72 mcg/day, but she never started - 03/2024: IBS symptoms well-controlled with fiber supplement, healthy diet   3) GERD: History of GERD, with previous hiatal hernia repair and Nissen fundoplication in 2017.  Reflux now well controlled, no longer needs any acid suppression therapy. -10/08/2021: EGD: Intact Nissen fundoplication.   Otherwise normal esophagus and duodenum  4) History of colon polyps.  Last colonoscopy in 09/2019 with a 2 mm sigmoid benign polyp, moderate left-sided diverticulosis, ascending colon lipoma.  Prior to that, colonoscopy in 02/2012 with benign hyperplastic polyps.   HPI:      Patient is a 73 y.o. female presenting to the Gastroenterology Clinic for routine follow-up.  Was last seen by [name] on 12/07/2023 for evaluation of esophageal spasm.  - 12/16/2023: Esophagram: 13 mm barium tablet did not pass beyond Nissen fundoplication.  Sluggish esophageal motility.  Recommended EGD and esophageal manometry, however patient reported that her episodes had resolved and she wanted to monitor and treat conservatively.    Today, she states symptoms have resolved without recurrence.  She used 1/2 tab clonazepam  on demand (already had this prescription) with good response.  She otherwise declined PPI or hyoscyamine previously.   Will use Zofran  prn for nausea. Has used 3-4 tabs over the last 12+ months in total. No emesis.   IBS well controlled with fiber supplements, healthy diet.   Reviewed labs from 02/2024: Normal CMP, CBC  Review of systems:     No chest pain, no SOB, no fevers, no urinary sx   Past Medical History:  Diagnosis Date   Allergy    seasonal   Chronic bilateral low back pain without sciatica 10/07/2016   DDD (degenerative disc disease), cervical 12/03/2020   Diverticulitis of sigmoid colon 08/01/2019   Dysrhythmia    Afib   Essential hypertension 09/09/2018   Family history of diabetes mellitus 06/09/2022   Fibromyalgia    Gastroesophageal reflux disease without esophagitis 11/06/2015   Generalized anxiety disorder with panic attacks 08/01/2019   Hyperlipidemia 09/09/2018   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   Lumbosacral spondylosis 10/07/2016   Mass of lower inner quadrant of right breast 07/11/2022   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     amoxicillin  (AMOXIL ) 500 MG tablet Take 2,000 mg by mouth See admin instructions. Take 2000 mg by mouth 1 hour before dental appointment     bismuth subsalicylate (PEPTO BISMOL) 262 MG/15ML suspension Take 15 mLs by mouth as needed for indigestion.     calcium  carbonate (TUMS - DOSED IN MG ELEMENTAL CALCIUM ) 500 MG chewable tablet Chew 1,000 mg by mouth as needed for indigestion or heartburn.     cetirizine (ZYRTEC) 10 MG tablet Take 10 mg by mouth at bedtime.     clonazePAM  (KLONOPIN ) 0.5 MG tablet Take 0.5 tablets (0.25 mg total) by mouth 2 (two) times daily as needed for anxiety. 30 tablet 1   Cyanocobalamin (B-12 PO) Take 1 tablet by mouth 2 (two) times a week.     DIGESTIVE ENZYMES PO Take 1 capsule by mouth as needed.     ELIQUIS  5 MG TABS tablet TAKE 1 TABLET TWICE A DAY 180 tablet 1   flecainide  (TAMBOCOR ) 50 MG tablet TAKE 1 TABLET(50 MG) BY MOUTH TWICE DAILY 180 tablet 2   FLUoxetine  (PROZAC ) 20 MG tablet Take 1 tablet (20 mg total) by mouth daily. 90 tablet 3   gabapentin  (NEURONTIN ) 100 MG capsule Take 2 capsules (200 mg total) by mouth 3 (three) times daily. 180 capsule 2   HYDROcodone -acetaminophen  (NORCO/VICODIN) 5-325 MG tablet Take 1 tablet by mouth every 8 (eight) hours as needed for moderate pain (pain score 4-6). 15 tablet 0   lovastatin  (MEVACOR ) 10 MG tablet TAKE 1 TABLET(10 MG) BY MOUTH AT BEDTIME 30 tablet 1   metoprolol  succinate (TOPROL -XL) 25 MG 24 hr tablet TAKE 1 TABLET(25 MG) BY MOUTH DAILY 90 tablet 1   ondansetron  (ZOFRAN -ODT) 4 MG disintegrating tablet Take 1 tablet (4 mg total) by mouth every 6 (six) hours as needed for nausea or vomiting. 30 tablet 1   pantoprazole  (PROTONIX ) 40 MG tablet TAKE 1 TABLET(40 MG) BY MOUTH DAILY 30  MINUTES BEFORE BREAKFAST 90 tablet 0   Polyethyl Glycol-Propyl Glycol (SYSTANE OP) Place 1 drop into both eyes in the morning and at bedtime.     ZINC OXIDE, TOPICAL, EX Apply 1 Application topically as needed (rash under stomach).     No current facility-administered medications for this visit.    Physical Exam:     BP 126/84 (BP Location: Left Arm, Patient Position: Sitting, Cuff Size: Normal)   Pulse 61   Ht 5' 2.25" (1.581 m) Comment: height measured without shoes  Wt 180 lb 8 oz (81.9 kg)   BMI 32.75 kg/m   GENERAL:  Pleasant female in NAD PSYCH: : Cooperative, normal affect NEURO: Alert and oriented x 3, no focal neurologic deficits   IMPRESSION and PLAN:    1) GERD - Well-controlled after Nissen fundoplication  2) Diverticulosis Diverticulosis with history of diverticulitis, with last episode in 04/2022. No recurrence since then.  3) History of colon polyps Colonoscopy in 02/2012 with benign hyperplastic polyps.  Repeat colonoscopy in 09/2019 with single benign inflammatory polyp.  Both prior procedures done at outside facilities.  Reviewed endoscopy reports and pathology results with patient today.  Not due for repeat colonoscopy at this juncture.  Can consider repeat in 2027.  4) IBS-C  5) Abdominal bloating - Well-controlled with fiber supplement and dietary modification  6) Esophageal spasm Esophagram in 11/2023 with sluggish esophageal motility and 13 mm barium tablet did not pass Nissen fundoplication readily.  She reports her symptoms have essentially resolved though.  We discussed the role/utility of esophageal manometry today, and given resolution of symptoms, she would like to hold off. - Continue cutting food into small pieces, chew thoroughly, plenty of fluids with meals - To call me if symptoms recur and we will plan for manometry    RTC in 12 months or sooner as needed     I spent 30 minutes of time, including in depth chart review, independent review of  results as outlined above, communicating results with the patient directly, face-to-face time with the patient, coordinating care, and ordering studies and medications as appropriate, and documentation.       Laquetta Plank Offie Waide ,DO, FACG 04/11/2024, 11:18 AM

## 2024-04-13 DIAGNOSIS — M4723 Other spondylosis with radiculopathy, cervicothoracic region: Secondary | ICD-10-CM | POA: Diagnosis not present

## 2024-04-13 DIAGNOSIS — M546 Pain in thoracic spine: Secondary | ICD-10-CM | POA: Diagnosis not present

## 2024-04-13 DIAGNOSIS — M9902 Segmental and somatic dysfunction of thoracic region: Secondary | ICD-10-CM | POA: Diagnosis not present

## 2024-04-13 DIAGNOSIS — M9901 Segmental and somatic dysfunction of cervical region: Secondary | ICD-10-CM | POA: Diagnosis not present

## 2024-04-19 ENCOUNTER — Other Ambulatory Visit: Payer: Self-pay | Admitting: Orthopaedic Surgery

## 2024-04-19 DIAGNOSIS — M25519 Pain in unspecified shoulder: Secondary | ICD-10-CM

## 2024-04-19 DIAGNOSIS — S46011A Strain of muscle(s) and tendon(s) of the rotator cuff of right shoulder, initial encounter: Secondary | ICD-10-CM | POA: Diagnosis not present

## 2024-04-20 ENCOUNTER — Other Ambulatory Visit: Payer: Self-pay | Admitting: Medical-Surgical

## 2024-04-21 DIAGNOSIS — M9902 Segmental and somatic dysfunction of thoracic region: Secondary | ICD-10-CM | POA: Diagnosis not present

## 2024-04-21 DIAGNOSIS — M546 Pain in thoracic spine: Secondary | ICD-10-CM | POA: Diagnosis not present

## 2024-04-21 DIAGNOSIS — M4723 Other spondylosis with radiculopathy, cervicothoracic region: Secondary | ICD-10-CM | POA: Diagnosis not present

## 2024-04-21 DIAGNOSIS — M9901 Segmental and somatic dysfunction of cervical region: Secondary | ICD-10-CM | POA: Diagnosis not present

## 2024-04-25 ENCOUNTER — Encounter: Payer: Self-pay | Admitting: Sports Medicine

## 2024-04-25 ENCOUNTER — Ambulatory Visit (INDEPENDENT_AMBULATORY_CARE_PROVIDER_SITE_OTHER): Admitting: Sports Medicine

## 2024-04-25 DIAGNOSIS — M2550 Pain in unspecified joint: Secondary | ICD-10-CM | POA: Diagnosis not present

## 2024-04-25 DIAGNOSIS — M75121 Complete rotator cuff tear or rupture of right shoulder, not specified as traumatic: Secondary | ICD-10-CM | POA: Diagnosis not present

## 2024-04-25 MED ORDER — TRAMADOL HCL 50 MG PO TABS: 50.0000 mg | ORAL_TABLET | Freq: Three times a day (TID) | ORAL | 0 refills | Status: DC | PRN

## 2024-04-25 NOTE — Telephone Encounter (Signed)
 Patient requesting Tramadol  refill  Medication not showing in current med list  In past med list - last written 5.2022 Patient seen in office today

## 2024-04-25 NOTE — Assessment & Plan Note (Signed)
 This very pleasant 73 year old female returns, she has multifactorial right shoulder pain, at the last visit she had more bicipital signs, I did note a full-thickness rotator cuff tear on the ultrasound. We did a biceps sheath injection, we obtain the MRI which did show full-thickness rotator cuff tearing and she has consulted with Dr. Yvonne Hering who is planning reverse shoulder arthroplasty. In the meantime she can continue to work out in the pool, she can add tramadol  to her pain regimen and I am happy to refill this if she runs out. She understands we will hold off on true schedule II narcotics as she will likely need stronger pain control postop.

## 2024-04-25 NOTE — Progress Notes (Signed)
    Procedures performed today:    None.  Independent interpretation of notes and tests performed by another provider:   None.  Brief History, Exam, Impression, and Recommendations:    Rotator cuff tear, right This very pleasant 73 year old female returns, she has multifactorial right shoulder pain, at the last visit she had more bicipital signs, I did note a full-thickness rotator cuff tear on the ultrasound. We did a biceps sheath injection, we obtain the MRI which did show full-thickness rotator cuff tearing and she has consulted with Dr. Yvonne Hering who is planning reverse shoulder arthroplasty. In the meantime she can continue to work out in the pool, she can add tramadol  to her pain regimen and I am happy to refill this if she runs out. She understands we will hold off on true schedule II narcotics as she will likely need stronger pain control postop.  Polyarthralgia with elevated ESR Geraldina does have a history of fibromyalgia, she has multiple joint aches and pains, she has had rheumatoid testing, everything was normal with the exception of the ESR which did come back over 50, combined with widespread aches and pains as well as her age this is suggestive of polymyalgia rheumatica. I would like her to consult with rheumatology, I will hold off on any prednisone  treatment or any additional labs right now.    ____________________________________________ Joselyn Nicely. Sandy Crumb, M.D., ABFM., CAQSM., AME. Primary Care and Sports Medicine [facility]  Adjunct Professor of [facility] [facility] [employer] Medicine  [employer]  Restaurant manager, fast food

## 2024-04-25 NOTE — Assessment & Plan Note (Signed)
 Melissa Rodgers does have a history of fibromyalgia, she has multiple joint aches and pains, she has had rheumatoid testing, everything was normal with the exception of the ESR which did come back over 50, combined with widespread aches and pains as well as her age this is suggestive of polymyalgia rheumatica. I would like her to consult with rheumatology, I will hold off on any prednisone  treatment or any additional labs right now.

## 2024-04-26 ENCOUNTER — Other Ambulatory Visit: Payer: Self-pay | Admitting: Medical-Surgical

## 2024-04-28 ENCOUNTER — Ambulatory Visit
Admission: RE | Admit: 2024-04-28 | Discharge: 2024-04-28 | Disposition: A | Discharge status: Home / Self Care | Source: Ambulatory Visit | Attending: Orthopaedic Surgery

## 2024-04-28 DIAGNOSIS — M9901 Segmental and somatic dysfunction of cervical region: Secondary | ICD-10-CM | POA: Diagnosis not present

## 2024-04-28 DIAGNOSIS — Z01818 Encounter for other preprocedural examination: Secondary | ICD-10-CM | POA: Diagnosis not present

## 2024-04-28 DIAGNOSIS — M25519 Pain in unspecified shoulder: Secondary | ICD-10-CM

## 2024-04-28 DIAGNOSIS — M4723 Other spondylosis with radiculopathy, cervicothoracic region: Secondary | ICD-10-CM | POA: Diagnosis not present

## 2024-04-28 DIAGNOSIS — M25511 Pain in right shoulder: Secondary | ICD-10-CM | POA: Diagnosis not present

## 2024-04-28 DIAGNOSIS — M19011 Primary osteoarthritis, right shoulder: Secondary | ICD-10-CM | POA: Diagnosis not present

## 2024-04-28 DIAGNOSIS — M546 Pain in thoracic spine: Secondary | ICD-10-CM | POA: Diagnosis not present

## 2024-04-28 DIAGNOSIS — M9902 Segmental and somatic dysfunction of thoracic region: Secondary | ICD-10-CM | POA: Diagnosis not present

## 2024-05-03 ENCOUNTER — Telehealth: Payer: Self-pay

## 2024-05-03 NOTE — Telephone Encounter (Signed)
 Copied from CRM [id number]. Topic: Clinical - Medical Advice >> May 03, 2024 12:34 PM Danelle Dunning F wrote: Reason for CRM:   Patient called in looking to schedule a sooner appointment that her July 17th, 2025 6 month follow up appointment so she can get surgery clearance for her total reverse shoulder replacement for her right shoulder.   Patient was notified by agent that there were no sooner appointments than early July available. Please work with admin staff to find the patient a sooner appointment due to the patient being instructed to schedule a sooner visit with PCP by CMA.   Callback Number: [id number]

## 2024-05-03 NOTE — Telephone Encounter (Signed)
 Is this something that you can assist the patient with ?

## 2024-05-04 ENCOUNTER — Encounter: Payer: Self-pay | Admitting: Cardiology

## 2024-05-04 ENCOUNTER — Ambulatory Visit: Attending: Cardiology | Admitting: Cardiology

## 2024-05-04 VITALS — BP 160/86 | HR 80 | Ht 62.0 in

## 2024-05-04 DIAGNOSIS — I2510 Atherosclerotic heart disease of native coronary artery without angina pectoris: Secondary | ICD-10-CM

## 2024-05-04 DIAGNOSIS — Z01810 Encounter for preprocedural cardiovascular examination: Secondary | ICD-10-CM

## 2024-05-04 DIAGNOSIS — E782 Mixed hyperlipidemia: Secondary | ICD-10-CM | POA: Diagnosis not present

## 2024-05-04 DIAGNOSIS — I480 Paroxysmal atrial fibrillation: Secondary | ICD-10-CM | POA: Diagnosis not present

## 2024-05-04 DIAGNOSIS — I10 Essential (primary) hypertension: Secondary | ICD-10-CM | POA: Diagnosis not present

## 2024-05-04 HISTORY — DX: Atherosclerotic heart disease of native coronary artery without angina pectoris: I25.10

## 2024-05-04 HISTORY — DX: Encounter for preprocedural cardiovascular examination: Z01.810

## 2024-05-04 NOTE — Progress Notes (Signed)
 Cardiology Office Note:    Date:  05/04/2024   ID:  Melissa Rodgers, DOB 06/25/1951, MRN [id number]  PCP:  Cherre Cornish, NP  Cardiologist:  Nelia Balzarine, MD   Referring MD: Cherre Cornish, NP    ASSESSMENT:    1. Paroxysmal atrial fibrillation (HCC)   2. Essential hypertension   3. Mild coronary artery disease   4. Preoperative cardiovascular examination   5. Mixed dyslipidemia    PLAN:    In order of problems listed above:  Coronary artery disease: Mild in nature: CT coronary angiography report from last year was reviewed and discussed with her at length.  She has mild coronary artery disease.  She has good effort tolerance.  I told her to walk half an hour on a daily basis and she promises to do so.  With this she has no symptoms.  In view of that she is not at high risk for coronary events during the forementioned surgery.  Meticulous hemodynamic monitoring will further reduce the risk of coronary events. Essential hypertension: Blood pressure stable and diet was emphasized.  She was advised to keep a track of her blood pressures at home.  She tells me that she has an element of whitecoat hypertension.  Blood pressure today follow-up was 134/70. Mixed dyslipidemia.  She stopped statins by herself.  She was advised to get started after her surgery.  She tells me that it gives her some spasmodic muscle pains.  She will follow-up with this with primary care.  If she has statin intolerance she can try medication such as Nexlizet at or PCSK9 group of medications. Paroxysmal atrial fibrillation:I discussed with the patient atrial fibrillation, disease process. Management and therapy including rate and rhythm control, anticoagulation benefits and potential risks were discussed extensively with the patient. Patient had multiple questions which were answered to patient's satisfaction. It was mentioned to her that she take her flecainide  in an uninterrupted fashion in the perioperative..  I told  her that she could stop Eliquis  3 days before the surgery unless her surgeon told her otherwise.  Benefits and potential risks explained.  Heparin bridging explained and she is not keen on it. Patient will be seen in follow-up appointment in 9 months or earlier if the patient has any concerns.    Medication Adjustments/Labs and Tests Ordered: Current medicines are reviewed at length with the patient today.  Concerns regarding medicines are outlined above.  Orders Placed This Encounter  Procedures   EKG 12-Lead   No orders of the defined types were placed in this encounter.    No chief complaint on file.    History of Present Illness:    Melissa Rodgers is a 73 y.o. female.  Patient has past medical history of paroxysmal atrial fibrillation, essential hypertension, mixed dyslipidemia and mild coronary artery disease.  She is planning to undergo shoulder reversal surgery.  She denies any chest pain orthopnea or PND.  She is well ambulatory.  She says she can walk 20 to 15 minutes on a normal pace without any problems.  At the time of my evaluation, the patient is alert awake oriented and in no distress.  Past Medical History:  Diagnosis Date   Allergy    seasonal   Chronic bilateral low back pain without sciatica 10/07/2016   DDD (degenerative disc disease), cervical 12/03/2020   Diverticulitis of sigmoid colon 08/01/2019   Dysrhythmia    Afib   Essential hypertension 09/09/2018   Family history of diabetes mellitus 06/09/2022  Fibromyalgia    Gastroesophageal reflux disease without esophagitis 11/06/2015   Generalized anxiety disorder with panic attacks 08/01/2019   Hyperlipidemia 09/09/2018   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   Lumbosacral spondylosis 10/07/2016   Mass of lower inner quadrant of right breast 07/11/2022   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 Hervey Loser, [facility]. polyp 2 mm in the sigmoid colon, ( Polypectomy). Moderate diverticulosis of the descending colon and sigmoid colon. Lipoma in the ascending colon. Internal hemorrhoids.   ESOPHAGOGASTRODUODENOSCOPY     possibly last one was 2017. In [place]   HERNIA REPAIR     JOINT REPLACEMENT     MEDIAL PARTIAL KNEE REPLACEMENT Right 12/14/2014   NISSEN FUNDOPLICATION  02/21/2016   OOPHORECTOMY     PARTIAL KNEE ARTHROPLASTY Left 04/05/2014   TOTAL KNEE REVISION Left 05/10/2021   Procedure: TOTAL KNEE REVISION;  Surgeon: Neil Balls, MD;  Location: [facility];  Service: Orthopedics;  Laterality: Left;    Current Medications: Current Meds  Medication Sig   Acetaminophen  (TYLENOL  PO) Take 2 tablets by mouth daily as needed (pain).   amoxicillin  (AMOXIL ) 500 MG tablet Take 2,000 mg by mouth See admin instructions. Take 2000 mg by mouth 1 hour before dental appointment   bismuth subsalicylate (PEPTO BISMOL) 262 MG/15ML suspension Take 15 mLs by mouth as needed for indigestion.   calcium  carbonate (TUMS - DOSED IN MG ELEMENTAL CALCIUM ) 500 MG chewable tablet Chew 1,000 mg by mouth as needed for indigestion or heartburn.   cetirizine (ZYRTEC) 10 MG tablet Take 10 mg by mouth at bedtime.   clonazePAM  (KLONOPIN ) 0.5 MG tablet Take 0.5 tablets (0.25 mg total) by mouth 2 (two) times daily as needed for anxiety.   Cyanocobalamin (B-12 PO) Take 1 tablet by mouth 2 (two) times a week.   DIGESTIVE ENZYMES PO Take 1 capsule by mouth as needed.   ELIQUIS  5 MG TABS tablet TAKE 1 TABLET TWICE A DAY   flecainide  (TAMBOCOR ) 50 MG tablet TAKE 1 TABLET(50 MG) BY MOUTH TWICE DAILY   FLUoxetine  (PROZAC ) 20 MG tablet Take 1 tablet (20 mg total) by mouth daily.   gabapentin   (NEURONTIN ) 100 MG capsule Take 2 capsules (200 mg total) by mouth 3 (three) times daily.   metoprolol  succinate (TOPROL -XL) 25 MG 24 hr tablet TAKE 1 TABLET(25 MG) BY MOUTH DAILY   ondansetron  (ZOFRAN -ODT) 4 MG disintegrating tablet Take 1 tablet (4 mg total) by mouth every 6 (six) hours as needed for nausea or vomiting.   pantoprazole  (PROTONIX ) 40 MG tablet TAKE 1 TABLET(40 MG) BY MOUTH DAILY 30 MINUTES BEFORE BREAKFAST   Polyethyl Glycol-Propyl Glycol (SYSTANE OP) Place 1 drop into both eyes in the morning and at bedtime.   traMADol  (ULTRAM ) 50 MG tablet Take 1 tablet (50 mg total) by mouth every 8 (eight) hours as needed for moderate pain (pain score 4-6).   ZINC OXIDE, TOPICAL, EX Apply 1 Application topically as needed (rash under stomach).     Allergies:   Crestor [rosuvastatin], Sulfa antibiotics, and Latex   Social History   Socioeconomic History   Marital status: Married    Spouse name: Palmer Bobo   Number of children: 0   Years of  education: 18   Highest education level: Master's degree (e.g., MA, MS, MEng, MEd, MSW, MBA)  Occupational History   Occupation: Retired  Tobacco Use   Smoking status: Former    Current packs/day: 0.00    Average packs/day: 0.3 packs/day for 5.1 years (1.3 ttl pk-yrs)    Types: Cigarettes    Start date: 07/25/1978    Quit date: 08/18/1983    Years since quitting: 40.7    Passive exposure: Past   Smokeless tobacco: Never  Vaping Use   Vaping status: Never Used  Substance and Sexual Activity   Alcohol use: Yes    Alcohol/week: 1.0 - 3.0 standard drink of alcohol    Types: 1 - 3 Glasses of wine per week    Comment: 1-2 drinks a week, usually wine   Drug use: Never   Sexual activity: Not Currently    Partners: Male    Birth control/protection: None  Other Topics Concern   Not on file  Social History Narrative   Lives with her husband. Stays active and has a lot of hobbies such as gardening, cooking, reading and doing crafts.   Social  Drivers of Corporate investment banker Strain: Low Risk  (01/20/2024)   Overall Financial Resource Strain (CARDIA)    Difficulty of Paying Living Expenses: Not hard at all  Food Insecurity: No Food Insecurity (01/20/2024)   Hunger Vital Sign    Worried About Running Out of Food in the Last Year: Never true    Ran Out of Food in the Last Year: Never true  Transportation Needs: No Transportation Needs (01/20/2024)   PRAPARE - Administrator, Civil Service (Medical): No    Lack of Transportation (Non-Medical): No  Physical Activity: Sufficiently Active (01/20/2024)   Exercise Vital Sign    Days of Exercise per Week: 6 days    Minutes of Exercise per Session: 40 min  Recent Concern: Physical Activity - Insufficiently Active (12/10/2023)   Exercise Vital Sign    Days of Exercise per Week: 3 days    Minutes of Exercise per Session: 20 min  Stress: No Stress Concern Present (01/20/2024)   [employer] of Occupational Health - Occupational Stress Questionnaire    Feeling of Stress : Not at all  Social Connections: Moderately Isolated (01/20/2024)   Social Connection and Isolation Panel [NHANES]    Frequency of Communication with Friends and Family: Three times a week    Frequency of Social Gatherings with Friends and Family: More than three times a week    Attends Religious Services: Never    Database administrator or Organizations: No    Attends Banker Meetings: Never    Marital Status: Married     Family History: The patient's family history includes Alzheimer's disease in her father; Anxiety disorder in her mother; Arthritis in her mother; Atrial fibrillation in her sister; Cancer in her father; Depression in her mother; Diabetes in her mother and sister; Heart disease in her father and mother; Hypertension in her father and sister; Tremor in an other family member. There is no history of Colon cancer, Esophageal cancer, Stomach cancer, or Rectal cancer.  ROS:    Please see the history of present illness.    All other systems reviewed and are negative.  EKGs/Labs/Other Studies Reviewed:    The following studies were reviewed today: .Aaron AasEKG Interpretation Date/Time:  Wednesday May 04 2024 09:31:28 EDT Ventricular Rate:  64 PR Interval:  194 QRS Duration:  82 QT Interval:  414 QTC Calculation: 427 R Axis:   -12  Text Interpretation: Normal sinus rhythm Septal infarct (cited on or before 13-Oct-2022) When compared with ECG of 16-Sep-2023 10:08, No significant change was found Confirmed by Hillis Lu [phone]) on 05/04/2024 9:39:11 AM     Recent Labs: 12/11/2023: TSH 1.730 03/14/2024: ALT 18; BUN 13; Creatinine, Ser 0.57; Hemoglobin 13.9; Platelets 283; Potassium 4.1; Sodium 141  Recent Lipid Panel    Component Value Date/Time   CHOL 216 (H) 12/11/2023 1057   TRIG 335 (H) 12/11/2023 1057   HDL 42 12/11/2023 1057   CHOLHDL 5.1 (H) 12/11/2023 1057   CHOLHDL 4.8 12/18/2022 0839   LDLCALC 116 (H) 12/11/2023 1057   LDLCALC 140 (H) 12/18/2022 0839    Physical Exam:    VS:  BP (!) 160/86   Pulse 80   Ht 5' 2 (1.575 m)   SpO2 98%   BMI 33.01 kg/m     Wt Readings from Last 3 Encounters:  04/11/24 180 lb 8 oz (81.9 kg)  01/20/24 172 lb (78 kg)  12/11/23 176 lb 11.2 oz (80.2 kg)     GEN: Patient is in no acute distress HEENT: Normal NECK: No JVD; No carotid bruits LYMPHATICS: No lymphadenopathy CARDIAC: Hear sounds regular, 2/6 systolic murmur at the apex. RESPIRATORY:  Clear to auscultation without rales, wheezing or rhonchi  ABDOMEN: Soft, non-tender, non-distended MUSCULOSKELETAL:  No edema; No deformity  SKIN: Warm and dry NEUROLOGIC:  Alert and oriented x 3 PSYCHIATRIC:  Normal affect   Signed, Nelia Balzarine, MD  05/04/2024 9:58 AM    [facility]

## 2024-05-04 NOTE — Patient Instructions (Addendum)
 Medication Instructions:  Your physician recommends that you continue on your current medications as directed. Please refer to the Current Medication list given to you today.  *If you need a refill on your cardiac medications before your next appointment, please call your pharmacy*  Lab Work: None ordered.  You may go to any [facility] Location for your lab work:  [facility] - [address] ([facility]) - [address] [phone] [address]  [place] - [address]   [place]  - [address]   [place] - [address] - [address] [employer]  - [address] ([facility]   If you have labs (blood work) drawn today and your tests are completely normal, you will receive your results only by: [employer] (if you have MyChart)  If you have any lab test that is abnormal or we need to change your treatment, we will call you or send a MyChart message to review the results.  Testing/Procedures: None ordered.  Follow-Up: At [facility], you and your health needs are our priority.  As part of our continuing mission to provide you with exceptional heart care, we have created designated Provider Care Teams.  These Care Teams include your primary Cardiologist (physician) and Advanced Practice Providers (APPs -  Physician Assistants and Nurse Practitioners) who all work together to provide you with the care you need, when you need it.   Your next appointment:   6 months  The format for your next appointment:   In Person  Provider:   Hillis Lu, MD

## 2024-05-05 ENCOUNTER — Telehealth: Payer: Self-pay

## 2024-05-05 DIAGNOSIS — M9902 Segmental and somatic dysfunction of thoracic region: Secondary | ICD-10-CM | POA: Diagnosis not present

## 2024-05-05 DIAGNOSIS — M4723 Other spondylosis with radiculopathy, cervicothoracic region: Secondary | ICD-10-CM | POA: Diagnosis not present

## 2024-05-05 DIAGNOSIS — M9901 Segmental and somatic dysfunction of cervical region: Secondary | ICD-10-CM | POA: Diagnosis not present

## 2024-05-05 DIAGNOSIS — M546 Pain in thoracic spine: Secondary | ICD-10-CM | POA: Diagnosis not present

## 2024-05-05 NOTE — Telephone Encounter (Signed)
 Clearance addressed in office visit note from 05/04/24 from Dr. Lafayette Pierre.

## 2024-05-05 NOTE — Telephone Encounter (Signed)
   Pre-operative Risk Assessment    Patient Name: Melissa Rodgers  DOB: 20-Apr-1951 MRN: [id number]   Date of last office visit: 05/04/24 Date of next office visit: N/A   Request for Surgical Clearance    Procedure:  right reverse total shoulder arthroplasty  Date of Surgery:  Clearance TBD                                Surgeon:  Dr. Grafton Lawrence Surgeon's Group or Practice Name:  [facility] Phone number:  [phone] Fax number:  [phone]   Type of Clearance Requested:   - Medical    Type of Anesthesia:  General  with interscalene block   Additional requests/questions:    SignedBascom Lily   05/05/2024, 10:39 AM

## 2024-05-12 ENCOUNTER — Encounter: Payer: Self-pay | Admitting: Medical-Surgical

## 2024-05-12 DIAGNOSIS — M546 Pain in thoracic spine: Secondary | ICD-10-CM | POA: Diagnosis not present

## 2024-05-12 DIAGNOSIS — M9902 Segmental and somatic dysfunction of thoracic region: Secondary | ICD-10-CM | POA: Diagnosis not present

## 2024-05-12 DIAGNOSIS — M4723 Other spondylosis with radiculopathy, cervicothoracic region: Secondary | ICD-10-CM | POA: Diagnosis not present

## 2024-05-12 DIAGNOSIS — M9901 Segmental and somatic dysfunction of cervical region: Secondary | ICD-10-CM | POA: Diagnosis not present

## 2024-05-13 NOTE — Telephone Encounter (Signed)
 Okay to get placed on cancellation list to get moved up.

## 2024-05-16 NOTE — Telephone Encounter (Signed)
 LMVM for the patient to contact the office to schedule her surgical clearance appointment on Thursday 05/19/2024. Okay per Joy Jessup to use a Hospital follow up or same day slot.

## 2024-05-18 ENCOUNTER — Encounter: Payer: Self-pay | Admitting: Medical-Surgical

## 2024-05-19 DIAGNOSIS — M4723 Other spondylosis with radiculopathy, cervicothoracic region: Secondary | ICD-10-CM | POA: Diagnosis not present

## 2024-05-19 DIAGNOSIS — M546 Pain in thoracic spine: Secondary | ICD-10-CM | POA: Diagnosis not present

## 2024-05-19 DIAGNOSIS — M9901 Segmental and somatic dysfunction of cervical region: Secondary | ICD-10-CM | POA: Diagnosis not present

## 2024-05-19 DIAGNOSIS — M9902 Segmental and somatic dysfunction of thoracic region: Secondary | ICD-10-CM | POA: Diagnosis not present

## 2024-05-20 ENCOUNTER — Ambulatory Visit (INDEPENDENT_AMBULATORY_CARE_PROVIDER_SITE_OTHER): Admitting: Sports Medicine

## 2024-05-20 ENCOUNTER — Encounter: Payer: Self-pay | Admitting: Medical-Surgical

## 2024-05-20 DIAGNOSIS — M75121 Complete rotator cuff tear or rupture of right shoulder, not specified as traumatic: Secondary | ICD-10-CM | POA: Diagnosis not present

## 2024-05-20 MED ORDER — TRAMADOL HCL 50 MG PO TABS: 50.0000 mg | ORAL_TABLET | Freq: Two times a day (BID) | ORAL | 0 refills | Status: DC | PRN

## 2024-05-20 NOTE — Progress Notes (Signed)
    Procedures performed today:    None.  Independent interpretation of notes and tests performed by another provider:   None.  Brief History, Exam, Impression, and Recommendations:    Rotator cuff tear, right Melissa Rodgers is scheduled for reverse shoulder arthroplasty, she does have atrial fibrillation currently rhythm controlled with medication. Twelve-lead ECG performed earlier this month. She has greater than 4 metabolic equivalents of cardiac capacity. She is taking Eliquis , she will stop Eliquis  2 nights before surgery, she has a low CHADS2Vasc score (<5) so we will not need to bridge with Lovenox. She can restart Eliquis  postop day 1. We are faxing the surgery clearance to the surgery center and Dr. Raguel office.  I spent 30 minutes of total time managing this patient today, this includes chart review, face to face, and non-face to face time.  ____________________________________________ Melissa Rodgers, M.D., ABFM., CAQSM., AME. Primary Care and Sports Medicine  [facility]  Adjunct Professor of [facility] Medicine  [employer]  Restaurant manager, fast food

## 2024-05-20 NOTE — Assessment & Plan Note (Addendum)
 Melissa Rodgers is scheduled for reverse shoulder arthroplasty, she does have atrial fibrillation currently rhythm controlled with medication. Twelve-lead ECG performed earlier this month. She has greater than 4 metabolic equivalents of cardiac capacity. She is taking Eliquis , she will stop Eliquis  2 nights before surgery, she has a low CHADS2Vasc score (<5) so we will not need to bridge with Lovenox. She can restart Eliquis  postop day 1. We are faxing the surgery clearance to the surgery center and Dr. Raguel office.

## 2024-05-25 ENCOUNTER — Other Ambulatory Visit: Payer: Self-pay

## 2024-05-25 MED ORDER — LOVASTATIN 10 MG PO TABS: 10.0000 mg | ORAL_TABLET | Freq: Every day | ORAL | 0 refills | Status: DC

## 2024-05-26 ENCOUNTER — Ambulatory Visit: Admitting: Cardiology

## 2024-06-01 ENCOUNTER — Ambulatory Visit: Admitting: Medical-Surgical

## 2024-06-01 DIAGNOSIS — M546 Pain in thoracic spine: Secondary | ICD-10-CM | POA: Diagnosis not present

## 2024-06-01 DIAGNOSIS — M9901 Segmental and somatic dysfunction of cervical region: Secondary | ICD-10-CM | POA: Diagnosis not present

## 2024-06-01 DIAGNOSIS — S46011D Strain of muscle(s) and tendon(s) of the rotator cuff of right shoulder, subsequent encounter: Secondary | ICD-10-CM | POA: Diagnosis not present

## 2024-06-01 DIAGNOSIS — M9902 Segmental and somatic dysfunction of thoracic region: Secondary | ICD-10-CM | POA: Diagnosis not present

## 2024-06-01 DIAGNOSIS — M4723 Other spondylosis with radiculopathy, cervicothoracic region: Secondary | ICD-10-CM | POA: Diagnosis not present

## 2024-06-01 DIAGNOSIS — M25511 Pain in right shoulder: Secondary | ICD-10-CM | POA: Diagnosis not present

## 2024-06-02 LAB — LAB REPORT - SCANNED: EGFR: 92

## 2024-06-09 ENCOUNTER — Ambulatory Visit (INDEPENDENT_AMBULATORY_CARE_PROVIDER_SITE_OTHER): Payer: Medicare Other | Admitting: Medical-Surgical

## 2024-06-09 ENCOUNTER — Encounter: Payer: Self-pay | Admitting: Medical-Surgical

## 2024-06-09 VITALS — BP 132/73 | HR 63 | Resp 20 | Ht 62.0 in | Wt 179.1 lb

## 2024-06-09 DIAGNOSIS — T466X5A Adverse effect of antihyperlipidemic and antiarteriosclerotic drugs, initial encounter: Secondary | ICD-10-CM | POA: Diagnosis not present

## 2024-06-09 DIAGNOSIS — E782 Mixed hyperlipidemia: Secondary | ICD-10-CM | POA: Diagnosis not present

## 2024-06-09 DIAGNOSIS — Z01818 Encounter for other preprocedural examination: Secondary | ICD-10-CM | POA: Diagnosis not present

## 2024-06-09 DIAGNOSIS — F411 Generalized anxiety disorder: Secondary | ICD-10-CM

## 2024-06-09 DIAGNOSIS — I10 Essential (primary) hypertension: Secondary | ICD-10-CM

## 2024-06-09 DIAGNOSIS — F410 Panic disorder [episodic paroxysmal anxiety] without agoraphobia: Secondary | ICD-10-CM | POA: Diagnosis not present

## 2024-06-09 DIAGNOSIS — G720 Drug-induced myopathy: Secondary | ICD-10-CM | POA: Diagnosis not present

## 2024-06-09 DIAGNOSIS — M2550 Pain in unspecified joint: Secondary | ICD-10-CM | POA: Diagnosis not present

## 2024-06-09 DIAGNOSIS — M4723 Other spondylosis with radiculopathy, cervicothoracic region: Secondary | ICD-10-CM | POA: Diagnosis not present

## 2024-06-09 DIAGNOSIS — M9902 Segmental and somatic dysfunction of thoracic region: Secondary | ICD-10-CM | POA: Diagnosis not present

## 2024-06-09 DIAGNOSIS — M546 Pain in thoracic spine: Secondary | ICD-10-CM | POA: Diagnosis not present

## 2024-06-09 DIAGNOSIS — M9901 Segmental and somatic dysfunction of cervical region: Secondary | ICD-10-CM | POA: Diagnosis not present

## 2024-06-09 MED ORDER — GABAPENTIN 300 MG PO CAPS: 300.0000 mg | ORAL_CAPSULE | Freq: Three times a day (TID) | ORAL | Status: DC

## 2024-06-09 MED ORDER — FLUOXETINE HCL 20 MG PO TABS: 20.0000 mg | ORAL_TABLET | Freq: Every day | ORAL | 3 refills | Status: AC

## 2024-06-09 NOTE — Progress Notes (Signed)
 Established patient visit  Discussed the use of AI scribe software for clinical note transcription with the patient, who gave verbal consent to proceed.  History of Present Illness   Melissa Rodgers is a 73 year old female who presents for follow-up and management of multiple chronic conditions.   Upcoming right shoulder surgery - Having a total reverse shoulder replacement on 06/29/24  Dyslipidemia and statin intolerance - Elevated triglycerides and LDL cholesterol at 125 mg/dL - Discontinued statins due to muscle spasms and pain, which resolved after cessation - Previous trials of Crestor and lovastatin , both causing muscle aches - Attempting a low-fat diet, but recently consumed comfort foods such as cookies  Elevated hematocrit and glycemic status - Elevated hematocrit - Hemoglobin A1c slightly elevated at 5.7  Polymyalgia and inflammatory markers - Polymyalgia rheumatica with previously elevated ESR, possibly related to acute inflammation from a thumb injury - Awaiting rheumatology appointment in December - Rheumatoid arthritis and lupus panels are normal  Chronic pain and mood symptoms - Chronic pain managed with fluoxetine  and gabapentin  200 mg three times daily - Edible gummies provide temporary pain relief without side effects - Persistent pain negatively impacts mood despite fluoxetine  use - Hydrocodone  and tramadol  reserved for post-surgical pain        Physical Exam Vitals reviewed.  Constitutional:      General: She is not in acute distress.    Appearance: Normal appearance. She is not ill-appearing.  HENT:     Head: Normocephalic and atraumatic.  Cardiovascular:     Rate and Rhythm: Normal rate and regular rhythm.     Pulses: Normal pulses.     Heart sounds: Normal heart sounds. No murmur heard.    No friction rub. No gallop.  Pulmonary:     Effort: Pulmonary effort is normal. No respiratory distress.     Breath sounds: Normal breath sounds. No  wheezing.  Skin:    General: Skin is warm and dry.  Neurological:     Mental Status: She is alert and oriented to person, place, and time.  Psychiatric:        Mood and Affect: Mood normal.        Behavior: Behavior normal.        Thought Content: Thought content normal.        Judgment: Judgment normal.     Assessment and Plan    GAD w/panic attacks Stable on Fluoxetine  20mg , some breakthrough anxiety related to widespread pain - Continue Fluoxetine   Polyarthralgia Pain in hip and shoulder girdles with previously elevated ESR. Rheumatology referral in place. - Order ESR test to reassess inflammation levels.  Hyperlipidemia Elevated triglycerides and LDL cholesterol. Statin intolerance documented. Dietary modifications attempted with occasional non-compliance. - Document statin myopathy in the chart to prevent automatic statin prescriptions. - Continue dietary modifications to manage lipid levels.  Hypertension Well-controlled under current management.   Preoperative clearance Reviewed history and current medications for upcoming right reverse total shoulder replacement - She is a low to moderate risk for her upcoming procedure - Medically cleared for her upcoming procedure.  General Health Maintenance Considering edibles for pain management, informed of risks and benefits. - Discuss risks and benefits of using edibles for pain management. - Ensure informed decision-making regarding non-prescription pain management options.     Return in about 6 months (around 12/10/2024) for chronic disease follow up.  __________________________________ Zada FREDRIK Palin, DNP, APRN, FNP-BC Primary Care and Sports Medicine [facility] [place]

## 2024-06-10 ENCOUNTER — Ambulatory Visit: Payer: Self-pay | Admitting: Medical-Surgical

## 2024-06-10 LAB — SEDIMENTATION RATE: Sed Rate: 26 mm/h (ref 0–40)

## 2024-06-10 NOTE — Telephone Encounter (Signed)
Printed to abstract.

## 2024-06-16 DIAGNOSIS — M546 Pain in thoracic spine: Secondary | ICD-10-CM | POA: Diagnosis not present

## 2024-06-16 DIAGNOSIS — M4723 Other spondylosis with radiculopathy, cervicothoracic region: Secondary | ICD-10-CM | POA: Diagnosis not present

## 2024-06-16 DIAGNOSIS — M9902 Segmental and somatic dysfunction of thoracic region: Secondary | ICD-10-CM | POA: Diagnosis not present

## 2024-06-16 DIAGNOSIS — M9901 Segmental and somatic dysfunction of cervical region: Secondary | ICD-10-CM | POA: Diagnosis not present

## 2024-06-23 DIAGNOSIS — M546 Pain in thoracic spine: Secondary | ICD-10-CM | POA: Diagnosis not present

## 2024-06-23 DIAGNOSIS — M9902 Segmental and somatic dysfunction of thoracic region: Secondary | ICD-10-CM | POA: Diagnosis not present

## 2024-06-23 DIAGNOSIS — M9901 Segmental and somatic dysfunction of cervical region: Secondary | ICD-10-CM | POA: Diagnosis not present

## 2024-06-23 DIAGNOSIS — M4723 Other spondylosis with radiculopathy, cervicothoracic region: Secondary | ICD-10-CM | POA: Diagnosis not present

## 2024-06-29 DIAGNOSIS — M75101 Unspecified rotator cuff tear or rupture of right shoulder, not specified as traumatic: Secondary | ICD-10-CM | POA: Diagnosis not present

## 2024-06-29 DIAGNOSIS — G8918 Other acute postprocedural pain: Secondary | ICD-10-CM | POA: Diagnosis not present

## 2024-06-29 DIAGNOSIS — M19011 Primary osteoarthritis, right shoulder: Secondary | ICD-10-CM | POA: Diagnosis not present

## 2024-06-29 DIAGNOSIS — M25711 Osteophyte, right shoulder: Secondary | ICD-10-CM | POA: Diagnosis not present

## 2024-06-30 ENCOUNTER — Other Ambulatory Visit: Payer: Self-pay | Admitting: Medical-Surgical

## 2024-06-30 NOTE — Therapy (Signed)
 OUTPATIENT PHYSICAL THERAPY UPPER EXTREMITY EVALUATION   Patient Name: Melissa Rodgers MRN: [id number] DOB:03-Jun-1951, 73 y.o., female Today's Date: 07/01/2024  END OF SESSION:  PT End of Session - 07/01/24 1051     Visit Number 1    Number of Visits 24    Date for PT Re-Evaluation 09/23/24    Authorization Type MCR    Progress Note Due on Visit 10    PT Start Time 1100    PT Stop Time 1145    PT Time Calculation (min) 45 min    Activity Tolerance Patient tolerated treatment well    Behavior During Therapy [facility] for tasks assessed/performed          Past Medical History:  Diagnosis Date   Allergy    seasonal   Cataract 2024   Chronic bilateral low back pain without sciatica 10/07/2016   DDD (degenerative disc disease), cervical 12/03/2020   Diverticulitis of sigmoid colon 08/01/2019   Dysrhythmia    Afib   Essential hypertension 09/09/2018   Family history of diabetes mellitus 06/09/2022   Fibromyalgia    Gastroesophageal reflux disease without esophagitis 11/06/2015   Generalized anxiety disorder with panic attacks 08/01/2019   Hyperlipidemia 09/09/2018   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   Lumbosacral spondylosis 10/07/2016   Mass of lower inner quadrant of right breast 07/11/2022   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 Onetha Larger, [facility]. polyp 2 mm in the sigmoid colon, ( Polypectomy). Moderate diverticulosis of the descending colon and sigmoid colon. Lipoma in the ascending colon. Internal hemorrhoids.   ESOPHAGOGASTRODUODENOSCOPY     possibly last one was 2017. In [place]   HERNIA REPAIR     JOINT REPLACEMENT      MEDIAL PARTIAL KNEE REPLACEMENT Right 12/14/2014   NISSEN FUNDOPLICATION  02/21/2016   OOPHORECTOMY     PARTIAL KNEE ARTHROPLASTY Left 04/05/2014   TOTAL KNEE REVISION Left 05/10/2021   Procedure: TOTAL KNEE REVISION;  Surgeon: Yvone Rush, MD;  Location: [facility];  Service: Orthopedics;  Laterality: Left;   TUBAL LIGATION     Patient Active Problem List   Diagnosis Date Noted   Mild coronary artery disease 05/04/2024   Preoperative cardiovascular examination 05/04/2024   Polyarthralgia with elevated ESR 03/15/2024   Rotator cuff tear, right 01/29/2024   Allergy    Mass of lower inner quadrant of right breast 07/11/2022   Family history of diabetes mellitus 06/09/2022   Paroxysmal atrial fibrillation (HCC) 07/15/2021   DDD (degenerative disc disease), cervical 12/03/2020   Fibromyalgia 03/05/2020   Diverticulitis of sigmoid colon 08/01/2019   Generalized anxiety disorder with panic attacks 08/01/2019   Non-seasonal allergic rhinitis 09/09/2018   Essential hypertension 09/09/2018   Mixed dyslipidemia 09/09/2018   Lumbosacral spondylosis 10/07/2016   Primary osteoarthritis of both knees post bilateral hemiarthroplasty 10/07/2016   Chronic bilateral low back pain without sciatica 10/07/2016   Atrial fibrillation 04/17/2016   S/P laparoscopic fundoplication 03/17/2016   Gastroesophageal reflux disease without esophagitis 11/06/2015   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    PCP: Willo Mini, NP  REFERRING PROVIDER: Cristy Bonner DASEN, MD  REFERRING DIAG: Right lateralized reverse total shoulder arthroplasty   THERAPY DIAG:  Acute pain of right shoulder  Stiffness of right shoulder, not elsewhere classified  Muscle weakness (generalized)  Localized edema  Rationale for Evaluation and Treatment: Rehabilitation  ONSET DATE: 06/29/24  SUBJECTIVE:                                                                                                                                                                                       SUBJECTIVE STATEMENT: Patient underwent Rt lateralized RTSA surgery on 06/29/24. Right now the pain is pretty good since taking tylenol  and ibuprofen this morning. Pain has ebbed and flowed over the past 2 days. Does have some tingling in the thumb currently. All of her hobbies involve her arms including crafting and gardening. Wants to be able to use her sewing machine and is having difficulty with basic daily tasks right now.  Hand dominance: Left  PERTINENT HISTORY: Fibromyalgia  A-fib Chronic low back pain   PAIN:  Are you having pain? Yes: NPRS scale: 3 currently; at worst 7 Pain location: Rt shoulder Pain description: sore Aggravating factors: sleep positioning, anything aggravates it now Relieving factors: medication  PRECAUTIONS: Other: see protocol for specific ROM and lifting restrictions  RED FLAGS: None   WEIGHT BEARING RESTRICTIONS: Yes RUE < 1lb   FALLS:  Has patient fallen in last 6 months? No  LIVING ENVIRONMENT: Lives with: lives with their spouse Lives in: House/apartment Stairs: Yes: External: 8 steps; can reach both Has following equipment at home: Quad cane small base  OCCUPATION: Retired   PLOF: Independent  PATIENT GOALS: I want to use my arm again.   NEXT MD VISIT: 07/07/24  OBJECTIVE:  Note: Objective measures were completed at Evaluation unless otherwise noted.  DIAGNOSTIC FINDINGS:  None on file   PATIENT SURVEYS :  Quick DASH: 81.8% disability   COGNITION: Overall cognitive status: Within functional limits for tasks assessed     SENSATION: Not tested  POSTURE: Rt arm in sling, forward shoulders   UPPER EXTREMITY ROM:   Passive ROM Right eval Left eval  Shoulder flexion 30    Shoulder extension    Shoulder abduction    Shoulder adduction    Shoulder internal rotation    Shoulder external rotation 0    Elbow flexion    Elbow extension Full    Wrist flexion    Wrist extension     Wrist ulnar deviation    Wrist radial deviation    Wrist pronation    Wrist supination    (Blank rows = not tested)  UPPER EXTREMITY MMT: deferred on eval due to post-op acuity   MMT Right eval Left eval  Shoulder flexion    Shoulder extension  Shoulder abduction    Shoulder adduction    Shoulder internal rotation    Shoulder external rotation    Middle trapezius    Lower trapezius    Elbow flexion    Elbow extension    Wrist flexion    Wrist extension    Wrist ulnar deviation    Wrist radial deviation    Wrist pronation    Wrist supination    Grip strength (lbs)    (Blank rows = not tested)    PALPATION:  Not assessed                                                                                                                              [facility] Adult PT Treatment:                                                DATE: 07/01/24 Therapeutic Exercise: Demonstrated,performed and issued initial HEP.    Self Care: Reviewed protocol, typical progression Sling adjustment and educated on proper fit Ice frequently  Current restrictions/precautions    PATIENT EDUCATION: Education details: see treatment; POC  Person educated: Patient Education method: Explanation, Demonstration, Tactile cues, Verbal cues, and Handouts Education comprehension: verbalized understanding, returned demonstration, verbal cues required, tactile cues required, and needs further education  HOME EXERCISE PROGRAM: Access Code: [facility] URL: [email or web address] Date: 07/01/2024 Prepared by: Lucie Meeter  Exercises - Putty Squeezes  - 3 x daily - 7 x weekly - 1 sets - 10 reps - Wrist Flexion AROM  - 3 x daily - 7 x weekly - 1 sets - 10 reps - Wrist Extension AROM  - 3 x daily - 7 x weekly - 1 sets - 10 reps  ASSESSMENT:  CLINICAL IMPRESSION: Patient is a 73 y.o. female who was seen today for physical therapy evaluation and treatment for s/p Rt lateralized RTSA on 06/29/24.  She demonstrates Rt shoulder ROM and strength deficits that are consistent with her recent post-operative status. She will benefit from skilled PT to assist in restoring her RUE strength and ROM in order to optimize her function and assist in overall pain reduction.    OBJECTIVE IMPAIRMENTS: decreased activity tolerance, decreased knowledge of condition, decreased knowledge of use of DME, decreased ROM, decreased strength, increased edema, impaired UE functional use, postural dysfunction, and pain.   ACTIVITY LIMITATIONS: carrying, lifting, transfers, bed mobility, bathing, toileting, dressing, self feeding, reach over head, and hygiene/grooming  PARTICIPATION LIMITATIONS: meal prep, cleaning, laundry, driving, shopping, community activity, and yard work  PERSONAL FACTORS: Age, Fitness, Time since onset of injury/illness/exacerbation, and 3+ comorbidities: see PMH above are also affecting patient's functional outcome.   REHAB POTENTIAL: Good  CLINICAL DECISION MAKING: Stable/uncomplicated  EVALUATION COMPLEXITY: Low  GOALS: Goals reviewed with patient? Yes  SHORT TERM GOALS: Target date: 08/12/2024  Patient will be independent and compliant with initial HEP.   Baseline: initial HEP issued  Goal status: INITIAL  2.  Patient will demonstrate at least 120 degrees of Rt shoulder flexion PROM to facilitate progression towards AROM activity.  Baseline: see above Goal status: INITIAL  3.  Patient will demonstrate at least 30 degrees of Rt shoulder ER PROM to facilitate progression towards AROM activity.  Baseline: see above  Goal status: INITIAL   LONG TERM GOALS: Target date: 09/23/2024    Patient will demonstrate at least 140 degrees of shoulder flexion AROM without shrug to improve ability to reach into cabinets.  Baseline: AROM deferred  Goal status: INITIAL  2.  Patient will be able to complete sewing activity with minimal/no Rt shoulder pain.  Baseline: unable  Goal  status: INITIAL  3.  Patient will demonstrate at least 45 degrees of Rt shoulder ER AROM to improve ability to complete self-care activities.  Baseline: deferred Goal status: INITIAL  4.  Patient will demonstrate at least 90 degrees of Rt shoulder abduction AROM to improve ability to complete reaching activities.  Baseline: deferred Goal status: INITIAL  5.  Patient will score </= 20 % disability on the QuickDASH (MCID is 8-15.9) to signify clinically meaningful improvement in functional abilities.   Baseline: see above Goal status: INITIAL   PLAN: PT FREQUENCY: 2x/week  PT DURATION: 12 weeks  PLANNED INTERVENTIONS: 97164- PT Re-evaluation, 97750- Physical Performance Testing, 97110-Therapeutic exercises, 97530- Therapeutic activity, [id number]- Neuromuscular re-education, 97535- Self Care, [id number]- Manual therapy, 97016- Vasopneumatic device, 20560 (1-2 muscles), 20561 (3+ muscles)- Dry Needling, Cryotherapy, and Moist heat  PLAN FOR NEXT SESSION: Rt shoulder PROM within current protocol restrictions. Grip strengthening, elbow/wrist ROM. Game ready if tolerated.   Cree Kunert, PT, DPT, ATC 07/01/24 12:06 PM

## 2024-07-01 ENCOUNTER — Ambulatory Visit: Attending: Orthopaedic Surgery

## 2024-07-01 ENCOUNTER — Other Ambulatory Visit: Payer: Self-pay

## 2024-07-01 DIAGNOSIS — R600 Localized edema: Secondary | ICD-10-CM | POA: Insufficient documentation

## 2024-07-01 DIAGNOSIS — M25611 Stiffness of right shoulder, not elsewhere classified: Secondary | ICD-10-CM | POA: Diagnosis not present

## 2024-07-01 DIAGNOSIS — M25511 Pain in right shoulder: Secondary | ICD-10-CM | POA: Diagnosis not present

## 2024-07-01 DIAGNOSIS — M6281 Muscle weakness (generalized): Secondary | ICD-10-CM | POA: Insufficient documentation

## 2024-07-01 DIAGNOSIS — Z96611 Presence of right artificial shoulder joint: Secondary | ICD-10-CM | POA: Insufficient documentation

## 2024-07-05 NOTE — Therapy (Incomplete)
 OUTPATIENT PHYSICAL THERAPY TREATMENT   Patient Name: Melissa Rodgers MRN: [id number] DOB:1951-08-18, 73 y.o., female Today's Date: 07/05/2024  END OF SESSION:    Past Medical History:  Diagnosis Date   Allergy    seasonal   Cataract 2024   Chronic bilateral low back pain without sciatica 10/07/2016   DDD (degenerative disc disease), cervical 12/03/2020   Diverticulitis of sigmoid colon 08/01/2019   Dysrhythmia    Afib   Essential hypertension 09/09/2018   Family history of diabetes mellitus 06/09/2022   Fibromyalgia    Gastroesophageal reflux disease without esophagitis 11/06/2015   Generalized anxiety disorder with panic attacks 08/01/2019   Hyperlipidemia 09/09/2018   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   Lumbosacral spondylosis 10/07/2016   Mass of lower inner quadrant of right breast 07/11/2022   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 Onetha Larger, [facility]. polyp 2 mm in the sigmoid colon, ( Polypectomy). Moderate diverticulosis of the descending colon and sigmoid colon. Lipoma in the ascending colon. Internal hemorrhoids.   ESOPHAGOGASTRODUODENOSCOPY     possibly last one was 2017. In [place]   HERNIA REPAIR     JOINT REPLACEMENT     MEDIAL PARTIAL KNEE REPLACEMENT Right 12/14/2014   NISSEN FUNDOPLICATION  02/21/2016   OOPHORECTOMY     PARTIAL KNEE ARTHROPLASTY Left 04/05/2014   TOTAL KNEE REVISION Left 05/10/2021   Procedure: TOTAL KNEE REVISION;  Surgeon: Yvone Rush, MD;  Location: [facility];  Service: Orthopedics;  Laterality: Left;   TUBAL LIGATION     Patient Active Problem List   Diagnosis Date Noted   Mild coronary  artery disease 05/04/2024   Preoperative cardiovascular examination 05/04/2024   Polyarthralgia with elevated ESR 03/15/2024   Rotator cuff tear, right 01/29/2024   Allergy    Mass of lower inner quadrant of right breast 07/11/2022   Family history of diabetes mellitus 06/09/2022   Paroxysmal atrial fibrillation (HCC) 07/15/2021   DDD (degenerative disc disease), cervical 12/03/2020   Fibromyalgia 03/05/2020   Diverticulitis of sigmoid colon 08/01/2019   Generalized anxiety disorder with panic attacks 08/01/2019   Non-seasonal allergic rhinitis 09/09/2018   Essential hypertension 09/09/2018   Mixed dyslipidemia 09/09/2018   Lumbosacral spondylosis 10/07/2016   Primary osteoarthritis of both knees post bilateral hemiarthroplasty 10/07/2016   Chronic bilateral low back pain without sciatica 10/07/2016   Atrial fibrillation 04/17/2016   S/P laparoscopic fundoplication 03/17/2016   Gastroesophageal reflux disease without esophagitis 11/06/2015   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    PCP: Willo Mini, NP  REFERRING PROVIDER: Cristy Bonner DASEN, MD  REFERRING DIAG: Right lateralized reverse total shoulder arthroplasty   THERAPY DIAG:  No diagnosis found.  Rationale for Evaluation and Treatment: Rehabilitation  ONSET DATE: 06/29/24  SUBJECTIVE:  Per eval: Patient underwent Rt lateralized RTSA surgery on 06/29/24. Right now the pain is pretty good since taking tylenol  and ibuprofen this morning. Pain has ebbed and flowed over the past 2 days. Does have some tingling in the thumb currently. All of her hobbies involve her arms including crafting and gardening. Wants to be able to use her sewing machine and is having difficulty with basic daily tasks right now.  Hand dominance: Left  SUBJECTIVE STATEMENT: 07/05/2024:  ***  PERTINENT HISTORY: Fibromyalgia  A-fib Chronic low back pain   PAIN:  Are you having pain? Yes: NPRS scale: 3 currently; at worst 7 Pain location: Rt shoulder Pain description: sore Aggravating factors: sleep positioning, anything aggravates it now Relieving factors: medication  PRECAUTIONS: Other: see protocol for specific ROM and lifting restrictions  RED FLAGS: None   WEIGHT BEARING RESTRICTIONS: Yes RUE < 1lb   FALLS:  Has patient fallen in last 6 months? No  LIVING ENVIRONMENT: Lives with: lives with their spouse Lives in: House/apartment Stairs: Yes: External: 8 steps; can reach both Has following equipment at home: Quad cane small base  OCCUPATION: Retired   PLOF: Independent  PATIENT GOALS: I want to use my arm again.   NEXT MD VISIT: 07/07/24  OBJECTIVE:  Note: Objective measures were completed at Evaluation unless otherwise noted.  DIAGNOSTIC FINDINGS:  None on file   PATIENT SURVEYS :  Quick DASH: 81.8% disability   COGNITION: Overall cognitive status: Within functional limits for tasks assessed     SENSATION: Not tested  POSTURE: Rt arm in sling, forward shoulders   UPPER EXTREMITY ROM:   Passive ROM Right eval Left eval  Shoulder flexion 30    Shoulder extension    Shoulder abduction    Shoulder adduction    Shoulder internal rotation    Shoulder external rotation 0    Elbow flexion    Elbow extension Full    Wrist flexion    Wrist extension    Wrist ulnar deviation    Wrist radial deviation    Wrist pronation    Wrist supination    (Blank rows = not tested)  UPPER EXTREMITY MMT: deferred on eval due to post-op acuity   MMT Right eval Left eval  Shoulder flexion    Shoulder extension    Shoulder abduction    Shoulder adduction    Shoulder internal rotation    Shoulder external rotation    Middle trapezius    Lower trapezius    Elbow flexion    Elbow extension    Wrist flexion    Wrist extension     Wrist ulnar deviation    Wrist radial deviation    Wrist pronation    Wrist supination    Grip strength (lbs)    (Blank rows = not tested)    PALPATION:  Not assessed  [facility] Adult PT Treatment:                                                DATE: 07/06/24 Therapeutic Exercise: *** Manual Therapy: *** Neuromuscular re-ed: *** Therapeutic Activity: *** Modalities: *** Self Care: [facility] Adult PT Treatment:                                                DATE: 07/01/24 Therapeutic Exercise: Demonstrated,performed and issued initial HEP.    Self Care: Reviewed protocol, typical progression Sling adjustment and educated on proper fit Ice frequently  Current restrictions/precautions    PATIENT EDUCATION: Education details: see treatment; POC  Person educated: Patient Education method: Explanation, Demonstration, Tactile cues, Verbal cues, and Handouts Education comprehension: verbalized understanding, returned demonstration, verbal cues required, tactile cues required, and needs further education  HOME EXERCISE PROGRAM: Access Code: [facility] URL: [email or web address] Date: 07/01/2024 Prepared by: Lucie Meeter  Exercises - Putty Squeezes  - 3 x daily - 7 x weekly - 1 sets - 10 reps - Wrist Flexion AROM  - 3 x daily - 7 x weekly - 1 sets - 10 reps - Wrist Extension AROM  - 3 x daily - 7 x weekly - 1 sets - 10 reps  ASSESSMENT:  CLINICAL IMPRESSION: 07/05/2024: ***  Per eval: Patient is a 73 y.o. female who was seen today for physical therapy evaluation and treatment for s/p Rt lateralized RTSA on 06/29/24. She demonstrates Rt shoulder ROM and strength deficits that are consistent with her recent post-operative status. She will benefit from skilled PT to assist in restoring her RUE strength and ROM in order to optimize her  function and assist in overall pain reduction.    OBJECTIVE IMPAIRMENTS: decreased activity tolerance, decreased knowledge of condition, decreased knowledge of use of DME, decreased ROM, decreased strength, increased edema, impaired UE functional use, postural dysfunction, and pain.   ACTIVITY LIMITATIONS: carrying, lifting, transfers, bed mobility, bathing, toileting, dressing, self feeding, reach over head, and hygiene/grooming  PARTICIPATION LIMITATIONS: meal prep, cleaning, laundry, driving, shopping, community activity, and yard work  PERSONAL FACTORS: Age, Fitness, Time since onset of injury/illness/exacerbation, and 3+ comorbidities: see PMH above are also affecting patient's functional outcome.   REHAB POTENTIAL: Good  CLINICAL DECISION MAKING: Stable/uncomplicated  EVALUATION COMPLEXITY: Low  GOALS: Goals reviewed with patient? Yes  SHORT TERM GOALS: Target date: 08/12/2024    Patient will be independent and compliant with initial HEP.   Baseline: initial HEP issued  Goal status: INITIAL  2.  Patient will demonstrate at least 120 degrees of Rt shoulder flexion PROM to facilitate progression towards AROM activity.  Baseline: see above Goal status: INITIAL  3.  Patient will demonstrate at least 30 degrees of Rt shoulder ER PROM to facilitate progression towards AROM activity.  Baseline: see above  Goal status: INITIAL   LONG TERM GOALS: Target date: 09/23/2024    Patient will demonstrate at least 140 degrees of shoulder flexion AROM without shrug to improve ability to reach into cabinets.  Baseline: AROM deferred  Goal status: INITIAL  2.  Patient will be able to complete sewing activity with minimal/no Rt shoulder pain.  Baseline: unable  Goal status: INITIAL  3.  Patient will demonstrate at least 45 degrees of Rt shoulder ER AROM to improve ability to complete self-care activities.  Baseline: deferred Goal status: INITIAL  4.  Patient will demonstrate at  least 90 degrees of Rt shoulder abduction AROM to improve ability to complete reaching activities.  Baseline: deferred Goal status: INITIAL  5.  Patient will score </= 20 % disability on the QuickDASH (MCID is 8-15.9) to signify clinically meaningful improvement in functional abilities.   Baseline: see above Goal status: INITIAL   PLAN: PT FREQUENCY: 2x/week  PT DURATION: 12 weeks  PLANNED INTERVENTIONS: 97164- PT Re-evaluation, 97750- Physical Performance Testing, 97110-Therapeutic exercises, 97530- Therapeutic activity, [id number]- Neuromuscular re-education, 97535- Self Care, 02859- Manual therapy, 97016- Vasopneumatic device, 20560 (1-2 muscles), 20561 (3+ muscles)- Dry Needling, Cryotherapy, and Moist heat  PLAN FOR NEXT SESSION: Rt shoulder PROM within current protocol restrictions. Grip strengthening, elbow/wrist ROM. Game ready if tolerated.   Alm DELENA Jenny PT, DPT 07/05/2024 5:08 PM

## 2024-07-06 ENCOUNTER — Ambulatory Visit: Admitting: Physical Therapy

## 2024-07-07 DIAGNOSIS — M19011 Primary osteoarthritis, right shoulder: Secondary | ICD-10-CM | POA: Diagnosis not present

## 2024-07-08 ENCOUNTER — Ambulatory Visit

## 2024-07-08 DIAGNOSIS — M25611 Stiffness of right shoulder, not elsewhere classified: Secondary | ICD-10-CM | POA: Diagnosis not present

## 2024-07-08 DIAGNOSIS — M6281 Muscle weakness (generalized): Secondary | ICD-10-CM | POA: Diagnosis not present

## 2024-07-08 DIAGNOSIS — R600 Localized edema: Secondary | ICD-10-CM | POA: Diagnosis not present

## 2024-07-08 DIAGNOSIS — Z96611 Presence of right artificial shoulder joint: Secondary | ICD-10-CM | POA: Diagnosis not present

## 2024-07-08 DIAGNOSIS — M25511 Pain in right shoulder: Secondary | ICD-10-CM

## 2024-07-08 NOTE — Therapy (Signed)
 OUTPATIENT PHYSICAL THERAPY TREATMENT   Patient Name: Melissa Rodgers MRN: [id number] DOB:1951/10/21, 73 y.o., female Today's Date: 07/08/2024  END OF SESSION:  PT End of Session - 07/08/24 1054     Visit Number 2    Number of Visits 24    Date for PT Re-Evaluation 09/23/24    Authorization Type MCR    Progress Note Due on Visit 10    PT Start Time 1100    PT Stop Time 1145    PT Time Calculation (min) 45 min    Activity Tolerance Patient tolerated treatment well    Behavior During Therapy [facility] for tasks assessed/performed           Past Medical History:  Diagnosis Date   Allergy    seasonal   Cataract 2024   Chronic bilateral low back pain without sciatica 10/07/2016   DDD (degenerative disc disease), cervical 12/03/2020   Diverticulitis of sigmoid colon 08/01/2019   Dysrhythmia    Afib   Essential hypertension 09/09/2018   Family history of diabetes mellitus 06/09/2022   Fibromyalgia    Gastroesophageal reflux disease without esophagitis 11/06/2015   Generalized anxiety disorder with panic attacks 08/01/2019   Hyperlipidemia 09/09/2018   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   Lumbosacral spondylosis 10/07/2016   Mass of lower inner quadrant of right breast 07/11/2022   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 Onetha Larger, [facility]. polyp 2 mm in the sigmoid colon, ( Polypectomy). Moderate diverticulosis of the descending colon and sigmoid colon. Lipoma in the ascending colon. Internal hemorrhoids.   ESOPHAGOGASTRODUODENOSCOPY     possibly last one was 2017. In [place]   HERNIA REPAIR     JOINT REPLACEMENT     MEDIAL PARTIAL KNEE  REPLACEMENT Right 12/14/2014   NISSEN FUNDOPLICATION  02/21/2016   OOPHORECTOMY     PARTIAL KNEE ARTHROPLASTY Left 04/05/2014   TOTAL KNEE REVISION Left 05/10/2021   Procedure: TOTAL KNEE REVISION;  Surgeon: Yvone Rush, MD;  Location: [facility];  Service: Orthopedics;  Laterality: Left;   TUBAL LIGATION     Patient Active Problem List   Diagnosis Date Noted   Mild coronary artery disease 05/04/2024   Preoperative cardiovascular examination 05/04/2024   Polyarthralgia with elevated ESR 03/15/2024   Rotator cuff tear, right 01/29/2024   Allergy    Mass of lower inner quadrant of right breast 07/11/2022   Family history of diabetes mellitus 06/09/2022   Paroxysmal atrial fibrillation (HCC) 07/15/2021   DDD (degenerative disc disease), cervical 12/03/2020   Fibromyalgia 03/05/2020   Diverticulitis of sigmoid colon 08/01/2019   Generalized anxiety disorder with panic attacks 08/01/2019   Non-seasonal allergic rhinitis 09/09/2018   Essential hypertension 09/09/2018   Mixed dyslipidemia 09/09/2018   Lumbosacral spondylosis 10/07/2016   Primary osteoarthritis of both knees post bilateral hemiarthroplasty 10/07/2016   Chronic bilateral low back pain without sciatica 10/07/2016   Atrial fibrillation 04/17/2016   S/P laparoscopic fundoplication 03/17/2016   Gastroesophageal reflux disease without esophagitis 11/06/2015   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    PCP: Willo Mini, NP  REFERRING PROVIDER: Cristy Bonner DASEN, MD  REFERRING DIAG: Right lateralized reverse total shoulder arthroplasty   THERAPY DIAG:  Acute pain of right shoulder  Stiffness  of right shoulder, not elsewhere classified  Muscle weakness (generalized)  Localized edema  Rationale for Evaluation and Treatment: Rehabilitation  ONSET DATE: 06/29/24  SUBJECTIVE:                                                                                                                                                                                      Per eval: Patient underwent Rt lateralized RTSA surgery on 06/29/24. Right now the pain is pretty good since taking tylenol  and ibuprofen this morning. Pain has ebbed and flowed over the past 2 days. Does have some tingling in the thumb currently. All of her hobbies involve her arms including crafting and gardening. Wants to be able to use her sewing machine and is having difficulty with basic daily tasks right now.  Hand dominance: Left  SUBJECTIVE STATEMENT: Patient had f/u with Dr. Cristy yesterday who said things are healing up nicely. He does think some of her pain is coming from her neck and gave her permission to come out of the sling some if she can stay cautious.   PERTINENT HISTORY: Fibromyalgia  A-fib Chronic low back pain   PAIN:  Are you having pain? Yes: NPRS scale: 3 currently Pain location: Rt shoulder Pain description: ache Aggravating factors: sleep positioning, anything aggravates it now Relieving factors: medication  PRECAUTIONS: Other: see protocol for specific ROM and lifting restrictions  RED FLAGS: None   WEIGHT BEARING RESTRICTIONS: Yes RUE < 1lb   FALLS:  Has patient fallen in last 6 months? No  LIVING ENVIRONMENT: Lives with: lives with their spouse Lives in: House/apartment Stairs: Yes: External: 8 steps; can reach both Has following equipment at home: Quad cane small base  OCCUPATION: Retired   PLOF: Independent  PATIENT GOALS: I want to use my arm again.   NEXT MD VISIT: September 2025  OBJECTIVE:  Note: Objective measures were completed at Evaluation unless otherwise noted.  DIAGNOSTIC FINDINGS:  None on file   PATIENT SURVEYS :  Quick DASH: 81.8% disability   COGNITION: Overall cognitive status: Within functional limits for tasks assessed     SENSATION: Not tested  POSTURE: Rt arm in sling, forward shoulders   UPPER EXTREMITY ROM:   Passive ROM Right eval Left eval 07/08/24 Right  PROM  Shoulder flexion  30   50  Shoulder extension     Shoulder abduction     Shoulder adduction     Shoulder internal rotation     Shoulder external rotation 0     Elbow flexion     Elbow extension Full     Wrist flexion     Wrist extension  Wrist ulnar deviation     Wrist radial deviation     Wrist pronation     Wrist supination     (Blank rows = not tested)  UPPER EXTREMITY MMT: deferred on eval due to post-op acuity   MMT Right eval Left eval  Shoulder flexion    Shoulder extension    Shoulder abduction    Shoulder adduction    Shoulder internal rotation    Shoulder external rotation    Middle trapezius    Lower trapezius    Elbow flexion    Elbow extension    Wrist flexion    Wrist extension    Wrist ulnar deviation    Wrist radial deviation    Wrist pronation    Wrist supination    Grip strength (lbs)    (Blank rows = not tested)    PALPATION:  Not assessed                                                                                                                              [facility] Adult PT Treatment:                                                DATE: 07/06/24 Therapeutic Exercise: Seated elbow flexion/extension AROM x 10 Reviewed HEP Manual Therapy: Rt shoulder flexion and ER PROM within protocol parameters Rt elbow PROM all planes to tolerance Rt wrist PROM all planes to tolerance   Modalities: Game ready (VASO) low compression 38 degrees Rt shoulder x 10 minutes  Self Care: Reviewed current post-operative restrictions   [facility] Adult PT Treatment:                                                DATE: 07/01/24 Therapeutic Exercise: Demonstrated,performed and issued initial HEP.    Self Care: Reviewed protocol, typical progression Sling adjustment and educated on proper fit Ice frequently  Current restrictions/precautions    PATIENT EDUCATION: Education details: see treatment Person educated: Patient Education method: Explanation, Demonstration, Tactile  cues, Verbal cues, and Handouts Education comprehension: verbalized understanding, returned demonstration, verbal cues required, tactile cues required, and needs further education  HOME EXERCISE PROGRAM: Access Code: [facility] URL: [email or web address] Date: 07/08/2024 Prepared by: Lucie Meeter  Exercises - Putty Squeezes  - 3 x daily - 7 x weekly - 1 sets - 10 reps - Wrist Flexion AROM  - 3 x daily - 7 x weekly - 1 sets - 10 reps - Wrist Extension AROM  - 3 x daily - 7 x weekly - 1 sets - 10 reps - Seated Elbow Flexion and Extension AROM  - 3 x daily - 7 x weekly - 1  sets - 10 reps  ASSESSMENT:  CLINICAL IMPRESSION: Patient overall tolerated session well today focusing on RUE PROM within post-operative restrictions. Rt shoulder flexion PROM has improved compared to evaluation, but still limited in regards to current post-operative ROM goal. Finished session with game ready for pain and swelling with good tolerance.   Per eval: Patient is a 73 y.o. female who was seen today for physical therapy evaluation and treatment for s/p Rt lateralized RTSA on 06/29/24. She demonstrates Rt shoulder ROM and strength deficits that are consistent with her recent post-operative status. She will benefit from skilled PT to assist in restoring her RUE strength and ROM in order to optimize her function and assist in overall pain reduction.    OBJECTIVE IMPAIRMENTS: decreased activity tolerance, decreased knowledge of condition, decreased knowledge of use of DME, decreased ROM, decreased strength, increased edema, impaired UE functional use, postural dysfunction, and pain.   ACTIVITY LIMITATIONS: carrying, lifting, transfers, bed mobility, bathing, toileting, dressing, self feeding, reach over head, and hygiene/grooming  PARTICIPATION LIMITATIONS: meal prep, cleaning, laundry, driving, shopping, community activity, and yard work  PERSONAL FACTORS: Age, Fitness, Time since onset of  injury/illness/exacerbation, and 3+ comorbidities: see PMH above are also affecting patient's functional outcome.   REHAB POTENTIAL: Good  CLINICAL DECISION MAKING: Stable/uncomplicated  EVALUATION COMPLEXITY: Low  GOALS: Goals reviewed with patient? Yes  SHORT TERM GOALS: Target date: 08/12/2024    Patient will be independent and compliant with initial HEP.   Baseline: initial HEP issued  Goal status: INITIAL  2.  Patient will demonstrate at least 120 degrees of Rt shoulder flexion PROM to facilitate progression towards AROM activity.  Baseline: see above Goal status: INITIAL  3.  Patient will demonstrate at least 30 degrees of Rt shoulder ER PROM to facilitate progression towards AROM activity.  Baseline: see above  Goal status: INITIAL   LONG TERM GOALS: Target date: 09/23/2024    Patient will demonstrate at least 140 degrees of shoulder flexion AROM without shrug to improve ability to reach into cabinets.  Baseline: AROM deferred  Goal status: INITIAL  2.  Patient will be able to complete sewing activity with minimal/no Rt shoulder pain.  Baseline: unable  Goal status: INITIAL  3.  Patient will demonstrate at least 45 degrees of Rt shoulder ER AROM to improve ability to complete self-care activities.  Baseline: deferred Goal status: INITIAL  4.  Patient will demonstrate at least 90 degrees of Rt shoulder abduction AROM to improve ability to complete reaching activities.  Baseline: deferred Goal status: INITIAL  5.  Patient will score </= 20 % disability on the QuickDASH (MCID is 8-15.9) to signify clinically meaningful improvement in functional abilities.   Baseline: see above Goal status: INITIAL   PLAN: PT FREQUENCY: 2x/week  PT DURATION: 12 weeks  PLANNED INTERVENTIONS: 97164- PT Re-evaluation, 97750- Physical Performance Testing, 97110-Therapeutic exercises, 97530- Therapeutic activity, [id number]- Neuromuscular re-education, 97535- Self Care, [id number]-  Manual therapy, 97016- Vasopneumatic device, 20560 (1-2 muscles), 20561 (3+ muscles)- Dry Needling, Cryotherapy, and Moist heat  PLAN FOR NEXT SESSION: Rt shoulder PROM within current protocol restrictions. Grip strengthening, elbow/wrist ROM. Game ready.   Zaira Iacovelli, PT, DPT, ATC 07/08/24 12:01 PM

## 2024-07-08 NOTE — Therapy (Signed)
 OUTPATIENT PHYSICAL THERAPY TREATMENT   Patient Name: Susann Lawhorne MRN: [id number] DOB:1951-04-19, 73 y.o., female Today's Date: 07/11/2024  END OF SESSION:  PT End of Session - 07/11/24 1314     Visit Number 3    Number of Visits 24    Date for PT Re-Evaluation 09/23/24    Authorization Type MCR    Progress Note Due on Visit 10    PT Start Time 1315    PT Stop Time 1401   10 min vaso   PT Time Calculation (min) 46 min    Activity Tolerance Patient tolerated treatment well            Past Medical History:  Diagnosis Date   Allergy    seasonal   Cataract 2024   Chronic bilateral low back pain without sciatica 10/07/2016   DDD (degenerative disc disease), cervical 12/03/2020   Diverticulitis of sigmoid colon 08/01/2019   Dysrhythmia    Afib   Essential hypertension 09/09/2018   Family history of diabetes mellitus 06/09/2022   Fibromyalgia    Gastroesophageal reflux disease without esophagitis 11/06/2015   Generalized anxiety disorder with panic attacks 08/01/2019   Hyperlipidemia 09/09/2018   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   Lumbosacral spondylosis 10/07/2016   Mass of lower inner quadrant of right breast 07/11/2022   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 Onetha Larger, [facility]. polyp 2 mm in the sigmoid colon, ( Polypectomy). Moderate diverticulosis of the descending colon and sigmoid colon. Lipoma in the ascending colon. Internal hemorrhoids.   ESOPHAGOGASTRODUODENOSCOPY     possibly last one was 2017. In [place]   HERNIA REPAIR     JOINT REPLACEMENT     MEDIAL PARTIAL KNEE REPLACEMENT Right 12/14/2014   NISSEN  FUNDOPLICATION  02/21/2016   OOPHORECTOMY     PARTIAL KNEE ARTHROPLASTY Left 04/05/2014   TOTAL KNEE REVISION Left 05/10/2021   Procedure: TOTAL KNEE REVISION;  Surgeon: Yvone Rush, MD;  Location: [facility];  Service: Orthopedics;  Laterality: Left;   TUBAL LIGATION     Patient Active Problem List   Diagnosis Date Noted   Mild coronary artery disease 05/04/2024   Preoperative cardiovascular examination 05/04/2024   Polyarthralgia with elevated ESR 03/15/2024   Rotator cuff tear, right 01/29/2024   Allergy    Mass of lower inner quadrant of right breast 07/11/2022   Family history of diabetes mellitus 06/09/2022   Paroxysmal atrial fibrillation (HCC) 07/15/2021   DDD (degenerative disc disease), cervical 12/03/2020   Fibromyalgia 03/05/2020   Diverticulitis of sigmoid colon 08/01/2019   Generalized anxiety disorder with panic attacks 08/01/2019   Non-seasonal allergic rhinitis 09/09/2018   Essential hypertension 09/09/2018   Mixed dyslipidemia 09/09/2018   Lumbosacral spondylosis 10/07/2016   Primary osteoarthritis of both knees post bilateral hemiarthroplasty 10/07/2016   Chronic bilateral low back pain without sciatica 10/07/2016   Atrial fibrillation 04/17/2016   S/P laparoscopic fundoplication 03/17/2016   Gastroesophageal reflux disease without esophagitis 11/06/2015   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    PCP: Willo Mini, NP  REFERRING PROVIDER: Cristy Bonner DASEN, MD  REFERRING DIAG: Right lateralized reverse total shoulder arthroplasty   THERAPY DIAG:  Acute pain of right shoulder  Stiffness of right shoulder, not elsewhere  classified  Muscle weakness (generalized)  Localized edema  Rationale for Evaluation and Treatment: Rehabilitation  ONSET DATE: 06/29/24  SUBJECTIVE:                                                                                                                                                                                     Per eval: Patient  underwent Rt lateralized RTSA surgery on 06/29/24. Right now the pain is pretty good since taking tylenol  and ibuprofen this morning. Pain has ebbed and flowed over the past 2 days. Does have some tingling in the thumb currently. All of her hobbies involve her arms including crafting and gardening. Wants to be able to use her sewing machine and is having difficulty with basic daily tasks right now.  Hand dominance: Left  SUBJECTIVE STATEMENT: Says she felt good during session but was a bit achey afterwards. Improved w/ ice and tylenol . Still having baseline achiness since surgery, but feels it is getting a bit more comfortable.    PERTINENT HISTORY: Fibromyalgia  A-fib Chronic low back pain   PAIN:  Are you having pain? Yes: NPRS scale: 4-5 currently Pain location: Rt shoulder Pain description: ache Aggravating factors: sleep positioning, anything aggravates it now Relieving factors: medication  PRECAUTIONS: Other: see protocol for specific ROM and lifting restrictions  RED FLAGS: None   WEIGHT BEARING RESTRICTIONS: Yes RUE < 1lb   FALLS:  Has patient fallen in last 6 months? No  LIVING ENVIRONMENT: Lives with: lives with their spouse Lives in: House/apartment Stairs: Yes: External: 8 steps; can reach both Has following equipment at home: Quad cane small base  OCCUPATION: Retired   PLOF: Independent  PATIENT GOALS: I want to use my arm again.   NEXT MD VISIT: September 2025  OBJECTIVE:  Note: Objective measures were completed at Evaluation unless otherwise noted.  DIAGNOSTIC FINDINGS:  None on file   PATIENT SURVEYS :  Quick DASH: 81.8% disability   COGNITION: Overall cognitive status: Within functional limits for tasks assessed     SENSATION: Not tested  POSTURE: Rt arm in sling, forward shoulders   UPPER EXTREMITY ROM:   Passive ROM Right eval Left eval 07/08/24 Right  PROM  Shoulder flexion 30   50  Shoulder extension     Shoulder abduction      Shoulder adduction     Shoulder internal rotation     Shoulder external rotation 0     Elbow flexion     Elbow extension Full     Wrist flexion     Wrist extension     Wrist ulnar deviation     Wrist radial deviation  Wrist pronation     Wrist supination     (Blank rows = not tested)  UPPER EXTREMITY MMT: deferred on eval due to post-op acuity   MMT Right eval Left eval  Shoulder flexion    Shoulder extension    Shoulder abduction    Shoulder adduction    Shoulder internal rotation    Shoulder external rotation    Middle trapezius    Lower trapezius    Elbow flexion    Elbow extension    Wrist flexion    Wrist extension    Wrist ulnar deviation    Wrist radial deviation    Wrist pronation    Wrist supination    Grip strength (lbs)    (Blank rows = not tested)    PALPATION:  Not assessed                                                                                                                              [facility] Adult PT Treatment:                                                DATE: 07/11/24 Therapeutic Exercise: Seated elbow flex/ext AAROM x10 Seated towel grip x10  HEP discussion/education  Manual Therapy: Supine; passive physiological movement  R GH scaption within pt tolerance, gentle oscillations to reduce guarding  Modalities: Game ready (VASO) low compression 38 degrees Rt shoulder x 10 minutes   Self Care: Education on post op protocol, rehab process, reinforcing post op restrictions, appropriate positioning    [facility] Adult PT Treatment:                                                DATE: 07/06/24 Therapeutic Exercise: Seated elbow flexion/extension AROM x 10 Reviewed HEP Manual Therapy: Rt shoulder flexion and ER PROM within protocol parameters Rt elbow PROM all planes to tolerance Rt wrist PROM all planes to tolerance   Modalities: Game ready (VASO) low compression 38 degrees Rt shoulder x 10 minutes  Self Care: Reviewed current  post-operative restrictions   [facility] Adult PT Treatment:                                                DATE: 07/01/24 Therapeutic Exercise: Demonstrated,performed and issued initial HEP.    Self Care: Reviewed protocol, typical progression Sling adjustment and educated on proper fit Ice frequently  Current restrictions/precautions    PATIENT EDUCATION: Education details: see treatment Person educated: Patient Education method: Explanation, Demonstration, Tactile cues, Verbal cues, and Handouts Education  comprehension: verbalized understanding, returned demonstration, verbal cues required, tactile cues required, and needs further education  HOME EXERCISE PROGRAM: Access Code: [facility] URL: [email or web address] Date: 07/08/2024 Prepared by: Lucie Meeter  Exercises - Putty Squeezes  - 3 x daily - 7 x weekly - 1 sets - 10 reps - Wrist Flexion AROM  - 3 x daily - 7 x weekly - 1 sets - 10 reps - Wrist Extension AROM  - 3 x daily - 7 x weekly - 1 sets - 10 reps - Seated Elbow Flexion and Extension AROM  - 3 x daily - 7 x weekly - 1 sets - 10 reps  ASSESSMENT:  CLINICAL IMPRESSION: 07/11/2024: Pt arrives w/ report of overall improvement despite some achiness after last session. Today continuing to work on PROM within post op protocol - limited mostly by muscle guarding although this improves with time/repetition. Formal ROM measurements deferred but appears comparable to most recent measurement. Elbow ROM modified to AAROM given report of inc effort with AROM. No adverse events, vaso at end of session with pt reporting good relief. Recommend continuing along current POC in order to address relevant deficits and improve functional tolerance. Pt departs today's session in no acute distress, all voiced questions/concerns addressed appropriately from PT perspective.     Per eval: Patient is a 73 y.o. female who was seen today for physical therapy evaluation and treatment for  s/p Rt lateralized RTSA on 06/29/24. She demonstrates Rt shoulder ROM and strength deficits that are consistent with her recent post-operative status. She will benefit from skilled PT to assist in restoring her RUE strength and ROM in order to optimize her function and assist in overall pain reduction.    OBJECTIVE IMPAIRMENTS: decreased activity tolerance, decreased knowledge of condition, decreased knowledge of use of DME, decreased ROM, decreased strength, increased edema, impaired UE functional use, postural dysfunction, and pain.   ACTIVITY LIMITATIONS: carrying, lifting, transfers, bed mobility, bathing, toileting, dressing, self feeding, reach over head, and hygiene/grooming  PARTICIPATION LIMITATIONS: meal prep, cleaning, laundry, driving, shopping, community activity, and yard work  PERSONAL FACTORS: Age, Fitness, Time since onset of injury/illness/exacerbation, and 3+ comorbidities: see PMH above are also affecting patient's functional outcome.   REHAB POTENTIAL: Good  CLINICAL DECISION MAKING: Stable/uncomplicated  EVALUATION COMPLEXITY: Low  GOALS: Goals reviewed with patient? Yes  SHORT TERM GOALS: Target date: 08/12/2024    Patient will be independent and compliant with initial HEP.   Baseline: initial HEP issued  Goal status: INITIAL  2.  Patient will demonstrate at least 120 degrees of Rt shoulder flexion PROM to facilitate progression towards AROM activity.  Baseline: see above Goal status: INITIAL  3.  Patient will demonstrate at least 30 degrees of Rt shoulder ER PROM to facilitate progression towards AROM activity.  Baseline: see above  Goal status: INITIAL   LONG TERM GOALS: Target date: 09/23/2024    Patient will demonstrate at least 140 degrees of shoulder flexion AROM without shrug to improve ability to reach into cabinets.  Baseline: AROM deferred  Goal status: INITIAL  2.  Patient will be able to complete sewing activity with minimal/no Rt shoulder  pain.  Baseline: unable  Goal status: INITIAL  3.  Patient will demonstrate at least 45 degrees of Rt shoulder ER AROM to improve ability to complete self-care activities.  Baseline: deferred Goal status: INITIAL  4.  Patient will demonstrate at least 90 degrees of Rt shoulder abduction AROM to improve ability to complete reaching activities.  Baseline: deferred  Goal status: INITIAL  5.  Patient will score </= 20 % disability on the QuickDASH (MCID is 8-15.9) to signify clinically meaningful improvement in functional abilities.   Baseline: see above Goal status: INITIAL   PLAN: PT FREQUENCY: 2x/week  PT DURATION: 12 weeks  PLANNED INTERVENTIONS: 97164- PT Re-evaluation, 97750- Physical Performance Testing, 97110-Therapeutic exercises, 97530- Therapeutic activity, [id number]- Neuromuscular re-education, 97535- Self Care, [id number]- Manual therapy, 97016- Vasopneumatic device, 20560 (1-2 muscles), 20561 (3+ muscles)- Dry Needling, Cryotherapy, and Moist heat  PLAN FOR NEXT SESSION: R shoulder PROM vs trial of gravity reduced AAROM within pt tolerance and post op protocol. Grip strengthening, elbow/wrist ROM. Game ready.   Alm DELENA Jenny PT, DPT 07/11/2024 3:40 PM

## 2024-07-10 ENCOUNTER — Encounter: Payer: Self-pay | Admitting: Medical-Surgical

## 2024-07-11 ENCOUNTER — Ambulatory Visit: Admitting: Physical Therapy

## 2024-07-11 ENCOUNTER — Encounter: Payer: Self-pay | Admitting: Physical Therapy

## 2024-07-11 DIAGNOSIS — R600 Localized edema: Secondary | ICD-10-CM | POA: Diagnosis not present

## 2024-07-11 DIAGNOSIS — M25611 Stiffness of right shoulder, not elsewhere classified: Secondary | ICD-10-CM

## 2024-07-11 DIAGNOSIS — M25511 Pain in right shoulder: Secondary | ICD-10-CM

## 2024-07-11 DIAGNOSIS — M6281 Muscle weakness (generalized): Secondary | ICD-10-CM

## 2024-07-11 DIAGNOSIS — Z96611 Presence of right artificial shoulder joint: Secondary | ICD-10-CM | POA: Diagnosis not present

## 2024-07-11 MED ORDER — GABAPENTIN 100 MG PO CAPS: 100.0000 mg | ORAL_CAPSULE | Freq: Three times a day (TID) | ORAL | 3 refills | Status: AC | PRN

## 2024-07-13 ENCOUNTER — Ambulatory Visit: Admitting: Physical Therapy

## 2024-07-13 DIAGNOSIS — M25611 Stiffness of right shoulder, not elsewhere classified: Secondary | ICD-10-CM

## 2024-07-13 DIAGNOSIS — M25511 Pain in right shoulder: Secondary | ICD-10-CM | POA: Diagnosis not present

## 2024-07-13 DIAGNOSIS — M6281 Muscle weakness (generalized): Secondary | ICD-10-CM | POA: Diagnosis not present

## 2024-07-13 DIAGNOSIS — Z96611 Presence of right artificial shoulder joint: Secondary | ICD-10-CM | POA: Diagnosis not present

## 2024-07-13 DIAGNOSIS — R600 Localized edema: Secondary | ICD-10-CM | POA: Diagnosis not present

## 2024-07-13 NOTE — Therapy (Signed)
 OUTPATIENT PHYSICAL THERAPY TREATMENT   Patient Name: Patrice Matthew MRN: [id number] DOB:1951/01/17, 73 y.o., female Today's Date: 07/13/2024  END OF SESSION:  PT End of Session - 07/13/24 1105     Visit Number 4    Number of Visits 24    Date for PT Re-Evaluation 09/23/24    Authorization Type MCR    Progress Note Due on Visit 10    PT Start Time 1104    PT Stop Time 1145    PT Time Calculation (min) 41 min    Activity Tolerance Patient tolerated treatment well             Past Medical History:  Diagnosis Date   Allergy    seasonal   Cataract 2024   Chronic bilateral low back pain without sciatica 10/07/2016   DDD (degenerative disc disease), cervical 12/03/2020   Diverticulitis of sigmoid colon 08/01/2019   Dysrhythmia    Afib   Essential hypertension 09/09/2018   Family history of diabetes mellitus 06/09/2022   Fibromyalgia    Gastroesophageal reflux disease without esophagitis 11/06/2015   Generalized anxiety disorder with panic attacks 08/01/2019   Hyperlipidemia 09/09/2018   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   Lumbosacral spondylosis 10/07/2016   Mass of lower inner quadrant of right breast 07/11/2022   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 Onetha Larger, [facility]. polyp 2 mm in the sigmoid colon, ( Polypectomy). Moderate diverticulosis of the descending colon and sigmoid colon. Lipoma in the ascending colon. Internal hemorrhoids.   ESOPHAGOGASTRODUODENOSCOPY     possibly last one was 2017. In [place]   HERNIA REPAIR     JOINT REPLACEMENT     MEDIAL PARTIAL KNEE REPLACEMENT Right 12/14/2014   NISSEN FUNDOPLICATION   02/21/2016   OOPHORECTOMY     PARTIAL KNEE ARTHROPLASTY Left 04/05/2014   TOTAL KNEE REVISION Left 05/10/2021   Procedure: TOTAL KNEE REVISION;  Surgeon: Yvone Rush, MD;  Location: [facility];  Service: Orthopedics;  Laterality: Left;   TUBAL LIGATION     Patient Active Problem List   Diagnosis Date Noted   Mild coronary artery disease 05/04/2024   Preoperative cardiovascular examination 05/04/2024   Polyarthralgia with elevated ESR 03/15/2024   Rotator cuff tear, right 01/29/2024   Allergy    Mass of lower inner quadrant of right breast 07/11/2022   Family history of diabetes mellitus 06/09/2022   Paroxysmal atrial fibrillation (HCC) 07/15/2021   DDD (degenerative disc disease), cervical 12/03/2020   Fibromyalgia 03/05/2020   Diverticulitis of sigmoid colon 08/01/2019   Generalized anxiety disorder with panic attacks 08/01/2019   Non-seasonal allergic rhinitis 09/09/2018   Essential hypertension 09/09/2018   Mixed dyslipidemia 09/09/2018   Lumbosacral spondylosis 10/07/2016   Primary osteoarthritis of both knees post bilateral hemiarthroplasty 10/07/2016   Chronic bilateral low back pain without sciatica 10/07/2016   Atrial fibrillation 04/17/2016   S/P laparoscopic fundoplication 03/17/2016   Gastroesophageal reflux disease without esophagitis 11/06/2015   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    PCP: Willo Mini, NP  REFERRING PROVIDER: Cristy Bonner DASEN, MD  REFERRING DIAG: Right lateralized reverse total shoulder arthroplasty   THERAPY DIAG:  Acute pain of right shoulder  Stiffness of right shoulder, not elsewhere classified  Muscle  weakness (generalized)  Localized edema  Rationale for Evaluation and Treatment: Rehabilitation  ONSET DATE: 06/29/24  SUBJECTIVE:                                                                                                                                                                                     Per eval: Patient underwent Rt  lateralized RTSA surgery on 06/29/24. Right now the pain is pretty good since taking tylenol  and ibuprofen this morning. Pain has ebbed and flowed over the past 2 days. Does have some tingling in the thumb currently. All of her hobbies involve her arms including crafting and gardening. Wants to be able to use her sewing machine and is having difficulty with basic daily tasks right now.  Hand dominance: Left  SUBJECTIVE STATEMENT: Patient reports the shoulder is starting to feel better. Is on day 4 on prednisone  and thinks this is helping.    PERTINENT HISTORY: Fibromyalgia  A-fib Chronic low back pain   PAIN:  Are you having pain? Yes: NPRS scale: 2 Pain location: Rt shoulder Pain description: ache Aggravating factors: sleep positioning, anything aggravates it now Relieving factors: medication  PRECAUTIONS: Other: see protocol for specific ROM and lifting restrictions  RED FLAGS: None   WEIGHT BEARING RESTRICTIONS: Yes RUE < 1lb   FALLS:  Has patient fallen in last 6 months? No  LIVING ENVIRONMENT: Lives with: lives with their spouse Lives in: House/apartment Stairs: Yes: External: 8 steps; can reach both Has following equipment at home: Quad cane small base  OCCUPATION: Retired   PLOF: Independent  PATIENT GOALS: I want to use my arm again.   NEXT MD VISIT: September 2025  OBJECTIVE:  Note: Objective measures were completed at Evaluation unless otherwise noted.  DIAGNOSTIC FINDINGS:  None on file   PATIENT SURVEYS :  Quick DASH: 81.8% disability   COGNITION: Overall cognitive status: Within functional limits for tasks assessed     SENSATION: Not tested  POSTURE: Rt arm in sling, forward shoulders   UPPER EXTREMITY ROM:   Passive ROM Right eval Left eval 07/08/24 Right  PROM 07/13/24 Right PROM  Shoulder flexion 30   50 89  Shoulder extension      Shoulder abduction      Shoulder adduction      Shoulder internal rotation      Shoulder  external rotation 0    12  Elbow flexion      Elbow extension Full      Wrist flexion      Wrist extension      Wrist ulnar deviation      Wrist radial deviation      Wrist  pronation      Wrist supination      (Blank rows = not tested)  UPPER EXTREMITY MMT: deferred on eval due to post-op acuity   MMT Right eval Left eval  Shoulder flexion    Shoulder extension    Shoulder abduction    Shoulder adduction    Shoulder internal rotation    Shoulder external rotation    Middle trapezius    Lower trapezius    Elbow flexion    Elbow extension    Wrist flexion    Wrist extension    Wrist ulnar deviation    Wrist radial deviation    Wrist pronation    Wrist supination    Grip strength (lbs)    (Blank rows = not tested)    PALPATION:  Not assessed    [facility] Adult PT Treatment:                                                DATE: 07/13/24 Therapeutic Exercise: Supine shoulder flexion AAROM with wrist clasp x 5 Seated and standing towel slides at plinth in flexion attempted d/c due to pain/form  Manual Therapy: Rt shoulder flexion, ER PROM within post-op parameters; Rt elbow PROM all planes to tolerance   Self Care: Reviewed post-op restrictions.                                                                                                                         [facility] Adult PT Treatment:                                                DATE: 07/11/24 Therapeutic Exercise: Seated elbow flex/ext AAROM x10 Seated towel grip x10  HEP discussion/education  Manual Therapy: Supine; passive physiological movement  R GH scaption within pt tolerance, gentle oscillations to reduce guarding  Modalities: Game ready (VASO) low compression 38 degrees Rt shoulder x 10 minutes   Self Care: Education on post op protocol, rehab process, reinforcing post op restrictions, appropriate positioning    [facility] Adult PT Treatment:                                                DATE:  07/06/24 Therapeutic Exercise: Seated elbow flexion/extension AROM x 10 Reviewed HEP Manual Therapy: Rt shoulder flexion and ER PROM within protocol parameters Rt elbow PROM all planes to tolerance Rt wrist PROM all planes to tolerance   Modalities: Game ready (VASO) low compression 38 degrees Rt shoulder x 10 minutes  Self Care: Reviewed current post-operative restrictions   [facility] Adult  PT Treatment:                                                DATE: 07/01/24 Therapeutic Exercise: Demonstrated,performed and issued initial HEP.    Self Care: Reviewed protocol, typical progression Sling adjustment and educated on proper fit Ice frequently  Current restrictions/precautions    PATIENT EDUCATION: Education details: see treatment Person educated: Patient Education method: Explanation, Demonstration, Tactile cues, and Verbal cues Education comprehension: verbalized understanding, returned demonstration, verbal cues required, tactile cues required, and needs further education  HOME EXERCISE PROGRAM: Access Code: [facility] URL: [email or web address] Date: 07/13/2024 Prepared by: Lucie Meeter  Exercises - Putty Squeezes  - 3 x daily - 7 x weekly - 1 sets - 10 reps - Wrist Flexion AROM  - 3 x daily - 7 x weekly - 1 sets - 10 reps - Wrist Extension AROM  - 3 x daily - 7 x weekly - 1 sets - 10 reps - Seated Elbow Flexion and Extension AROM  - 3 x daily - 7 x weekly - 1 sets - 10 reps - Supine Shoulder Flexion AAROM  - 2 x daily - 7 x weekly - 1 sets - 5 reps  ASSESSMENT:  CLINICAL IMPRESSION: Patient tolerated session well today with continued emphasis on shoulder PROM within post-op parameters. Shoulder flexion and ER PROM continues to improve. See objective measures above. Introduced AAROM with patient tolerating supine shoulder flexion AAROM with wrist clasp well. Attempted towel slides, but this was too painful and patient was unsure if she could setup and  maintain ROM restrictions with this exercise at home. We added supine shoulder flexion AAROM as part of HEP educating patient on her current ROM restrictions with patient verbalizing understanding. She declined ice and has plans to ice at home.    Per eval: Patient is a 73 y.o. female who was seen today for physical therapy evaluation and treatment for s/p Rt lateralized RTSA on 06/29/24. She demonstrates Rt shoulder ROM and strength deficits that are consistent with her recent post-operative status. She will benefit from skilled PT to assist in restoring her RUE strength and ROM in order to optimize her function and assist in overall pain reduction.    OBJECTIVE IMPAIRMENTS: decreased activity tolerance, decreased knowledge of condition, decreased knowledge of use of DME, decreased ROM, decreased strength, increased edema, impaired UE functional use, postural dysfunction, and pain.   ACTIVITY LIMITATIONS: carrying, lifting, transfers, bed mobility, bathing, toileting, dressing, self feeding, reach over head, and hygiene/grooming  PARTICIPATION LIMITATIONS: meal prep, cleaning, laundry, driving, shopping, community activity, and yard work  PERSONAL FACTORS: Age, Fitness, Time since onset of injury/illness/exacerbation, and 3+ comorbidities: see PMH above are also affecting patient's functional outcome.   REHAB POTENTIAL: Good  CLINICAL DECISION MAKING: Stable/uncomplicated  EVALUATION COMPLEXITY: Low  GOALS: Goals reviewed with patient? Yes  SHORT TERM GOALS: Target date: 08/12/2024    Patient will be independent and compliant with initial HEP.   Baseline: initial HEP issued  Goal status: INITIAL  2.  Patient will demonstrate at least 120 degrees of Rt shoulder flexion PROM to facilitate progression towards AROM activity.  Baseline: see above Goal status: INITIAL  3.  Patient will demonstrate at least 30 degrees of Rt shoulder ER PROM to facilitate progression towards AROM activity.   Baseline: see above  Goal status: INITIAL  LONG TERM GOALS: Target date: 09/23/2024    Patient will demonstrate at least 140 degrees of shoulder flexion AROM without shrug to improve ability to reach into cabinets.  Baseline: AROM deferred  Goal status: INITIAL  2.  Patient will be able to complete sewing activity with minimal/no Rt shoulder pain.  Baseline: unable  Goal status: INITIAL  3.  Patient will demonstrate at least 45 degrees of Rt shoulder ER AROM to improve ability to complete self-care activities.  Baseline: deferred Goal status: INITIAL  4.  Patient will demonstrate at least 90 degrees of Rt shoulder abduction AROM to improve ability to complete reaching activities.  Baseline: deferred Goal status: INITIAL  5.  Patient will score </= 20 % disability on the QuickDASH (MCID is 8-15.9) to signify clinically meaningful improvement in functional abilities.   Baseline: see above Goal status: INITIAL   PLAN: PT FREQUENCY: 2x/week  PT DURATION: 12 weeks  PLANNED INTERVENTIONS: 97164- PT Re-evaluation, 97750- Physical Performance Testing, 97110-Therapeutic exercises, 97530- Therapeutic activity, [id number]- Neuromuscular re-education, 97535- Self Care, [id number]- Manual therapy, 97016- Vasopneumatic device, 20560 (1-2 muscles), 20561 (3+ muscles)- Dry Needling, Cryotherapy, and Moist heat  PLAN FOR NEXT SESSION: R shoulder PROM vs trial of gravity reduced AAROM within pt tolerance and post op protocol. Grip strengthening, elbow/wrist ROM. Game ready.   Leeandra Ellerson, PT, DPT, ATC 07/13/24 11:57 AM

## 2024-07-15 NOTE — Therapy (Signed)
 OUTPATIENT PHYSICAL THERAPY TREATMENT   Patient Name: Melissa Rodgers MRN: [id number] DOB:1951-10-01, 74 y.o., female Today's Date: 07/18/2024  END OF SESSION:  PT End of Session - 07/18/24 1314     Visit Number 5    Number of Visits 24    Date for PT Re-Evaluation 09/23/24    Authorization Type MCR    Progress Note Due on Visit 10    PT Start Time 1315    PT Stop Time 1355    PT Time Calculation (min) 40 min    Activity Tolerance Patient tolerated treatment well              Past Medical History:  Diagnosis Date   Allergy    seasonal   Cataract 2024   Chronic bilateral low back pain without sciatica 10/07/2016   DDD (degenerative disc disease), cervical 12/03/2020   Diverticulitis of sigmoid colon 08/01/2019   Dysrhythmia    Afib   Essential hypertension 09/09/2018   Family history of diabetes mellitus 06/09/2022   Fibromyalgia    Gastroesophageal reflux disease without esophagitis 11/06/2015   Generalized anxiety disorder with panic attacks 08/01/2019   Hyperlipidemia 09/09/2018   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   Lumbosacral spondylosis 10/07/2016   Mass of lower inner quadrant of right breast 07/11/2022   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 Onetha Larger, [facility]. polyp 2 mm in the sigmoid colon, ( Polypectomy). Moderate diverticulosis of the descending colon and sigmoid colon. Lipoma in the ascending colon. Internal hemorrhoids.   ESOPHAGOGASTRODUODENOSCOPY     possibly last one was 2017. In [place]   HERNIA REPAIR     JOINT REPLACEMENT     MEDIAL PARTIAL KNEE REPLACEMENT Right 12/14/2014   NISSEN FUNDOPLICATION   02/21/2016   OOPHORECTOMY     PARTIAL KNEE ARTHROPLASTY Left 04/05/2014   TOTAL KNEE REVISION Left 05/10/2021   Procedure: TOTAL KNEE REVISION;  Surgeon: Yvone Rush, MD;  Location: [facility];  Service: Orthopedics;  Laterality: Left;   TUBAL LIGATION     Patient Active Problem List   Diagnosis Date Noted   Mild coronary artery disease 05/04/2024   Preoperative cardiovascular examination 05/04/2024   Polyarthralgia with elevated ESR 03/15/2024   Rotator cuff tear, right 01/29/2024   Allergy    Mass of lower inner quadrant of right breast 07/11/2022   Family history of diabetes mellitus 06/09/2022   Paroxysmal atrial fibrillation (HCC) 07/15/2021   DDD (degenerative disc disease), cervical 12/03/2020   Fibromyalgia 03/05/2020   Diverticulitis of sigmoid colon 08/01/2019   Generalized anxiety disorder with panic attacks 08/01/2019   Non-seasonal allergic rhinitis 09/09/2018   Essential hypertension 09/09/2018   Mixed dyslipidemia 09/09/2018   Lumbosacral spondylosis 10/07/2016   Primary osteoarthritis of both knees post bilateral hemiarthroplasty 10/07/2016   Chronic bilateral low back pain without sciatica 10/07/2016   Atrial fibrillation 04/17/2016   S/P laparoscopic fundoplication 03/17/2016   Gastroesophageal reflux disease without esophagitis 11/06/2015   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    PCP: Willo Mini, NP  REFERRING PROVIDER: Cristy Bonner DASEN, MD  REFERRING DIAG: Right lateralized reverse total shoulder arthroplasty   THERAPY DIAG:  Acute pain of right shoulder  Stiffness of right shoulder, not elsewhere classified  Muscle weakness (generalized)  Localized edema  Rationale for Evaluation and Treatment: Rehabilitation  ONSET DATE: 06/29/24  SUBJECTIVE:                                                                                                                                                                                     Per eval: Patient underwent Rt  lateralized RTSA surgery on 06/29/24. Right now the pain is pretty good since taking tylenol  and ibuprofen this morning. Pain has ebbed and flowed over the past 2 days. Does have some tingling in the thumb currently. All of her hobbies involve her arms including crafting and gardening. Wants to be able to use her sewing machine and is having difficulty with basic daily tasks right now.  Hand dominance: Left  SUBJECTIVE STATEMENT: 07/18/2024: states shoulder is feeling a bit better. States she can't recline today due to her acid reflux. Felt good after last session. No other new issues.    PERTINENT HISTORY: Fibromyalgia  A-fib Chronic low back pain   PAIN:  Are you having pain? Yes: NPRS scale: 0/10 at rest Pain location: Rt shoulder Pain description: ache Aggravating factors: sleep positioning, anything aggravates it now Relieving factors: medication  PRECAUTIONS: Other: see protocol for specific ROM and lifting restrictions  RED FLAGS: None   WEIGHT BEARING RESTRICTIONS: Yes RUE < 1lb   FALLS:  Has patient fallen in last 6 months? No  LIVING ENVIRONMENT: Lives with: lives with their spouse Lives in: House/apartment Stairs: Yes: External: 8 steps; can reach both Has following equipment at home: Quad cane small base  OCCUPATION: Retired   PLOF: Independent  PATIENT GOALS: I want to use my arm again.   NEXT MD VISIT: September 2025  OBJECTIVE:  Note: Objective measures were completed at Evaluation unless otherwise noted.  DIAGNOSTIC FINDINGS:  None on file   PATIENT SURVEYS :  Quick DASH: 81.8% disability   COGNITION: Overall cognitive status: Within functional limits for tasks assessed     SENSATION: Not tested  POSTURE: Rt arm in sling, forward shoulders   UPPER EXTREMITY ROM:   Passive ROM Right eval Left eval 07/08/24 Right  PROM 07/13/24 Right PROM  Shoulder flexion 30   50 89  Shoulder extension      Shoulder abduction      Shoulder  adduction      Shoulder internal rotation      Shoulder external rotation 0    12  Elbow flexion      Elbow extension Full      Wrist flexion      Wrist extension      Wrist ulnar deviation  Wrist radial deviation      Wrist pronation      Wrist supination      (Blank rows = not tested)  UPPER EXTREMITY MMT: deferred on eval due to post-op acuity   MMT Right eval Left eval  Shoulder flexion    Shoulder extension    Shoulder abduction    Shoulder adduction    Shoulder internal rotation    Shoulder external rotation    Middle trapezius    Lower trapezius    Elbow flexion    Elbow extension    Wrist flexion    Wrist extension    Wrist ulnar deviation    Wrist radial deviation    Wrist pronation    Wrist supination    Grip strength (lbs)    (Blank rows = not tested)    PALPATION:  Not assessed     [facility] Adult PT Treatment:                                                DATE: 07/18/24 Therapeutic Exercise: Seated passive shoulder slides in scapular plane 3x5 <90 cues for comfortable ROM Standing AAROM shoulder flexion at table (itsy bitsy spider) x5 Standing passive shoulder flexion walkbacks x5 cues to avoid WB Seated passive shoulder flexion (sliding on legs) 2x5 cues for appropriate ROM (stopping above mid shin) HEP education/discussion  Self Care: Continued education re: post op rehab protocol, reinforcing precautions, ensuring appropriate setup w/ home program   [facility] Adult PT Treatment:                                                DATE: 07/13/24 Therapeutic Exercise: Supine shoulder flexion AAROM with wrist clasp x 5 Seated and standing towel slides at plinth in flexion attempted d/c due to pain/form  Manual Therapy: Rt shoulder flexion, ER PROM within post-op parameters; Rt elbow PROM all planes to tolerance   Self Care: Reviewed post-op restrictions.                                                                                                                          [facility] Adult PT Treatment:                                                DATE: 07/11/24 Therapeutic Exercise: Seated elbow flex/ext AAROM x10 Seated towel grip x10  HEP discussion/education  Manual Therapy: Supine; passive physiological movement  R GH scaption within pt tolerance, gentle oscillations to reduce guarding  Modalities: Game ready (VASO) low compression 38 degrees Rt shoulder x  10 minutes   Self Care: Education on post op protocol, rehab process, reinforcing post op restrictions, appropriate positioning     PATIENT EDUCATION: Education details: see treatment Person educated: Patient Education method: Explanation, Demonstration, Tactile cues, and Verbal cues Education comprehension: verbalized understanding, returned demonstration, verbal cues required, tactile cues required, and needs further education  HOME EXERCISE PROGRAM: Access Code: [facility] URL: [email or web address] Date: 07/18/2024 Prepared by: Alm Jenny  Exercises - Putty Squeezes  - 3 x daily - 7 x weekly - 1 sets - 10 reps - Wrist Flexion AROM  - 3 x daily - 7 x weekly - 1 sets - 10 reps - Wrist Extension AROM  - 3 x daily - 7 x weekly - 1 sets - 10 reps - Seated Elbow Flexion and Extension AROM  - 3 x daily - 7 x weekly - 1 sets - 10 reps - Supine Shoulder Flexion AAROM  - 2 x daily - 7 x weekly - 1 sets - 5 reps - Seated Shoulder Flexion Towel Slide at Table Top  - 2 x daily - 7 x weekly - 1 sets - 5 reps  ASSESSMENT:  CLINICAL IMPRESSION: 07/18/2024: Pt arrives just shy of 3 weeks post op with report of improving symptoms. Today continuing to work on [employer] within post op protocol. Deferring PT assisted passive physiological movement  as pt reports difficulty with reclined positions d/t reflux today. Given this, we expand program for pt-led [facility] which she tolerates quite well, reports reduced fatigue compared to prior sessions. No adverse events, no pain  on departure. Self care education as above and cues throughout to maintain precautions. Recommend continuing along current POC in order to address relevant deficits and improve functional tolerance. Pt departs today's session in no acute distress, all voiced questions/concerns addressed appropriately from PT perspective.     Per eval: Patient is a 72 y.o. female who was seen today for physical therapy evaluation and treatment for s/p Rt lateralized RTSA on 06/29/24. She demonstrates Rt shoulder ROM and strength deficits that are consistent with her recent post-operative status. She will benefit from skilled PT to assist in restoring her RUE strength and ROM in order to optimize her function and assist in overall pain reduction.    OBJECTIVE IMPAIRMENTS: decreased activity tolerance, decreased knowledge of condition, decreased knowledge of use of DME, decreased ROM, decreased strength, increased edema, impaired UE functional use, postural dysfunction, and pain.   ACTIVITY LIMITATIONS: carrying, lifting, transfers, bed mobility, bathing, toileting, dressing, self feeding, reach over head, and hygiene/grooming  PARTICIPATION LIMITATIONS: meal prep, cleaning, laundry, driving, shopping, community activity, and yard work  PERSONAL FACTORS: Age, Fitness, Time since onset of injury/illness/exacerbation, and 3+ comorbidities: see PMH above are also affecting patient's functional outcome.   REHAB POTENTIAL: Good  CLINICAL DECISION MAKING: Stable/uncomplicated  EVALUATION COMPLEXITY: Low  GOALS: Goals reviewed with patient? Yes  SHORT TERM GOALS: Target date: 08/12/2024    Patient will be independent and compliant with initial HEP.   Baseline: initial HEP issued  Goal status: INITIAL  2.  Patient will demonstrate at least 120 degrees of Rt shoulder flexion PROM to facilitate progression towards AROM activity.  Baseline: see above Goal status: INITIAL  3.  Patient will demonstrate at least 30  degrees of Rt shoulder ER PROM to facilitate progression towards AROM activity.  Baseline: see above  Goal status: INITIAL   LONG TERM GOALS: Target date: 09/23/2024    Patient will demonstrate at least 140 degrees of shoulder  flexion AROM without shrug to improve ability to reach into cabinets.  Baseline: AROM deferred  Goal status: INITIAL  2.  Patient will be able to complete sewing activity with minimal/no Rt shoulder pain.  Baseline: unable  Goal status: INITIAL  3.  Patient will demonstrate at least 45 degrees of Rt shoulder ER AROM to improve ability to complete self-care activities.  Baseline: deferred Goal status: INITIAL  4.  Patient will demonstrate at least 90 degrees of Rt shoulder abduction AROM to improve ability to complete reaching activities.  Baseline: deferred Goal status: INITIAL  5.  Patient will score </= 20 % disability on the QuickDASH (MCID is 8-15.9) to signify clinically meaningful improvement in functional abilities.   Baseline: see above Goal status: INITIAL   PLAN: PT FREQUENCY: 2x/week  PT DURATION: 12 weeks  PLANNED INTERVENTIONS: 97164- PT Re-evaluation, 97750- Physical Performance Testing, 97110-Therapeutic exercises, 97530- Therapeutic activity, [id number]- Neuromuscular re-education, 97535- Self Care, [id number]- Manual therapy, 97016- Vasopneumatic device, 20560 (1-2 muscles), 20561 (3+ muscles)- Dry Needling, Cryotherapy, and Moist heat  PLAN FOR NEXT SESSION: AAROM/PROM within pt tolerance and post op protocol. Grip strengthening, elbow/wrist ROM. Game ready.   Alm DELENA Jenny PT, DPT 07/18/2024 2:01 PM

## 2024-07-18 ENCOUNTER — Ambulatory Visit: Admitting: Physical Therapy

## 2024-07-18 ENCOUNTER — Encounter: Payer: Self-pay | Admitting: Physical Therapy

## 2024-07-18 DIAGNOSIS — M6281 Muscle weakness (generalized): Secondary | ICD-10-CM

## 2024-07-18 DIAGNOSIS — M25511 Pain in right shoulder: Secondary | ICD-10-CM

## 2024-07-18 DIAGNOSIS — Z96611 Presence of right artificial shoulder joint: Secondary | ICD-10-CM | POA: Diagnosis not present

## 2024-07-18 DIAGNOSIS — R600 Localized edema: Secondary | ICD-10-CM | POA: Diagnosis not present

## 2024-07-18 DIAGNOSIS — M25611 Stiffness of right shoulder, not elsewhere classified: Secondary | ICD-10-CM | POA: Diagnosis not present

## 2024-07-20 ENCOUNTER — Ambulatory Visit: Admitting: Physical Therapy

## 2024-07-20 ENCOUNTER — Encounter: Payer: Self-pay | Admitting: Physical Therapy

## 2024-07-20 DIAGNOSIS — M6281 Muscle weakness (generalized): Secondary | ICD-10-CM

## 2024-07-20 DIAGNOSIS — R600 Localized edema: Secondary | ICD-10-CM | POA: Diagnosis not present

## 2024-07-20 DIAGNOSIS — Z96611 Presence of right artificial shoulder joint: Secondary | ICD-10-CM | POA: Diagnosis not present

## 2024-07-20 DIAGNOSIS — M25511 Pain in right shoulder: Secondary | ICD-10-CM

## 2024-07-20 DIAGNOSIS — M25611 Stiffness of right shoulder, not elsewhere classified: Secondary | ICD-10-CM | POA: Diagnosis not present

## 2024-07-20 NOTE — Therapy (Signed)
 OUTPATIENT PHYSICAL THERAPY TREATMENT   Patient Name: Melissa Rodgers MRN: [id number] DOB:06/11/1951, 73 y.o., female Today's Date: 07/20/2024  END OF SESSION:  PT End of Session - 07/20/24 1058     Visit Number 6    Number of Visits 24    Date for PT Re-Evaluation 09/23/24    Authorization Type MCR    Progress Note Due on Visit 10    PT Start Time 1100    PT Stop Time 1141    PT Time Calculation (min) 41 min               Past Medical History:  Diagnosis Date   Allergy    seasonal   Cataract 2024   Chronic bilateral low back pain without sciatica 10/07/2016   DDD (degenerative disc disease), cervical 12/03/2020   Diverticulitis of sigmoid colon 08/01/2019   Dysrhythmia    Afib   Essential hypertension 09/09/2018   Family history of diabetes mellitus 06/09/2022   Fibromyalgia    Gastroesophageal reflux disease without esophagitis 11/06/2015   Generalized anxiety disorder with panic attacks 08/01/2019   Hyperlipidemia 09/09/2018   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   Lumbosacral spondylosis 10/07/2016   Mass of lower inner quadrant of right breast 07/11/2022   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 Onetha Larger, [facility]. polyp 2 mm in the sigmoid colon, ( Polypectomy). Moderate diverticulosis of the descending colon and sigmoid colon. Lipoma in the ascending colon. Internal hemorrhoids.   ESOPHAGOGASTRODUODENOSCOPY     possibly last one was 2017. In [place]   HERNIA REPAIR     JOINT REPLACEMENT     MEDIAL PARTIAL KNEE REPLACEMENT Right 12/14/2014   NISSEN FUNDOPLICATION  02/21/2016   OOPHORECTOMY     PARTIAL KNEE  ARTHROPLASTY Left 04/05/2014   TOTAL KNEE REVISION Left 05/10/2021   Procedure: TOTAL KNEE REVISION;  Surgeon: Yvone Rush, MD;  Location: [facility];  Service: Orthopedics;  Laterality: Left;   TUBAL LIGATION     Patient Active Problem List   Diagnosis Date Noted   Mild coronary artery disease 05/04/2024   Preoperative cardiovascular examination 05/04/2024   Polyarthralgia with elevated ESR 03/15/2024   Rotator cuff tear, right 01/29/2024   Allergy    Mass of lower inner quadrant of right breast 07/11/2022   Family history of diabetes mellitus 06/09/2022   Paroxysmal atrial fibrillation (HCC) 07/15/2021   DDD (degenerative disc disease), cervical 12/03/2020   Fibromyalgia 03/05/2020   Diverticulitis of sigmoid colon 08/01/2019   Generalized anxiety disorder with panic attacks 08/01/2019   Non-seasonal allergic rhinitis 09/09/2018   Essential hypertension 09/09/2018   Mixed dyslipidemia 09/09/2018   Lumbosacral spondylosis 10/07/2016   Primary osteoarthritis of both knees post bilateral hemiarthroplasty 10/07/2016   Chronic bilateral low back pain without sciatica 10/07/2016   Atrial fibrillation 04/17/2016   S/P laparoscopic fundoplication 03/17/2016   Gastroesophageal reflux disease without esophagitis 11/06/2015   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    PCP: Willo Mini, NP  REFERRING PROVIDER: Cristy Bonner DASEN, MD  REFERRING DIAG: Right lateralized reverse total shoulder arthroplasty   THERAPY DIAG:  Acute pain of right shoulder  Stiffness of right shoulder, not elsewhere classified  Muscle weakness (generalized)  Localized edema  Rationale  for Evaluation and Treatment: Rehabilitation  ONSET DATE: 06/29/24  SUBJECTIVE:                                                                                                                                                                                     Per eval: Patient underwent Rt lateralized RTSA surgery on 06/29/24. Right now the pain  is pretty good since taking tylenol  and ibuprofen this morning. Pain has ebbed and flowed over the past 2 days. Does have some tingling in the thumb currently. All of her hobbies involve her arms including crafting and gardening. Wants to be able to use her sewing machine and is having difficulty with basic daily tasks right now.  Hand dominance: Left  SUBJECTIVE STATEMENT: 07/20/2024: has general, usual achiness. Reflux better, feels like she can recline today. Feels like her elbow flexion AROM remains most difficulty, not really worsening or improving. Felt okay after last session, less fatigue than usual, doesn't recall any increase in pain.     PERTINENT HISTORY: Fibromyalgia  A-fib Chronic low back pain   PAIN:  Are you having pain? Yes: NPRS scale: <1/10 at rest Pain location: Rt shoulder Pain description: ache Aggravating factors: sleep positioning, anything aggravates it now Relieving factors: medication  PRECAUTIONS: Other: see protocol for specific ROM and lifting restrictions  RED FLAGS: None   WEIGHT BEARING RESTRICTIONS: Yes RUE < 1lb   FALLS:  Has patient fallen in last 6 months? No  LIVING ENVIRONMENT: Lives with: lives with their spouse Lives in: House/apartment Stairs: Yes: External: 8 steps; can reach both Has following equipment at home: Quad cane small base  OCCUPATION: Retired   PLOF: Independent  PATIENT GOALS: I want to use my arm again.   NEXT MD VISIT: September 2025  OBJECTIVE:  Note: Objective measures were completed at Evaluation unless otherwise noted.  DIAGNOSTIC FINDINGS:  None on file   PATIENT SURVEYS :  Quick DASH: 81.8% disability   COGNITION: Overall cognitive status: Within functional limits for tasks assessed     SENSATION: Not tested  POSTURE: Rt arm in sling, forward shoulders   UPPER EXTREMITY ROM:   Passive ROM Right eval Left eval 07/08/24 Right  PROM 07/13/24 Right PROM 07/20/24 R PROM  Shoulder  flexion 30   50 89   Shoulder extension       Shoulder abduction       Shoulder adduction       Shoulder internal rotation       Shoulder external rotation 0    12 10 deg  Elbow flexion       Elbow extension Full       Wrist  flexion       Wrist extension       Wrist ulnar deviation       Wrist radial deviation       Wrist pronation       Wrist supination       (Blank rows = not tested)  UPPER EXTREMITY MMT: deferred on eval due to post-op acuity   MMT Right eval Left eval  Shoulder flexion    Shoulder extension    Shoulder abduction    Shoulder adduction    Shoulder internal rotation    Shoulder external rotation    Middle trapezius    Lower trapezius    Elbow flexion    Elbow extension    Wrist flexion    Wrist extension    Wrist ulnar deviation    Wrist radial deviation    Wrist pronation    Wrist supination    Grip strength (lbs)    (Blank rows = not tested)    PALPATION:  Not assessed     [facility] Adult PT Treatment:                                                DATE: 07/20/24 Therapeutic Exercise: Seated passive shoulder slides 2x8 cues for appropriate ROM Supine short lever AAROM (hands clasped) 2x5 cues for comfortable ROM (~60deg) and minimizing elbow flare  Supine shoulder ER PROM (PT assisted to maintain appropriate ROM) <15 deg x8 Standing swiss ball flexion AAROM 2x5 cues for setup, shoulder<90 deg HEP discussion/education  Self Care: Continued reinforcement of post op rehab protocol, movement restrictions, appropriate positioning   [facility] Adult PT Treatment:                                                DATE: 07/18/24 Therapeutic Exercise: Seated passive shoulder slides in scapular plane 3x5 <90 cues for comfortable ROM Standing AAROM shoulder flexion at table (itsy bitsy spider) x5 Standing passive shoulder flexion walkbacks x5 cues to avoid WB Seated passive shoulder flexion (sliding on legs) 2x5 cues for appropriate ROM (stopping above mid  shin) HEP education/discussion  Self Care: Continued education re: post op rehab protocol, reinforcing precautions, ensuring appropriate setup w/ home program   [facility] Adult PT Treatment:                                                DATE: 07/13/24 Therapeutic Exercise: Supine shoulder flexion AAROM with wrist clasp x 5 Seated and standing towel slides at plinth in flexion attempted d/c due to pain/form  Manual Therapy: Rt shoulder flexion, ER PROM within post-op parameters; Rt elbow PROM all planes to tolerance   Self Care: Reviewed post-op restrictions.       PATIENT EDUCATION: Education details: see treatment Person educated: Patient Education method: Explanation, Demonstration, Tactile cues, and Verbal cues Education comprehension: verbalized understanding, returned demonstration, verbal cues required, tactile cues required, and needs further education  HOME EXERCISE PROGRAM: Access Code: [facility] URL: [email or web address] Date: 07/18/2024 Prepared by: Alm Jenny  Exercises - Putty Squeezes  - 3 x daily - 7 x  weekly - 1 sets - 10 reps - Wrist Flexion AROM  - 3 x daily - 7 x weekly - 1 sets - 10 reps - Wrist Extension AROM  - 3 x daily - 7 x weekly - 1 sets - 10 reps - Seated Elbow Flexion and Extension AROM  - 3 x daily - 7 x weekly - 1 sets - 10 reps - Supine Shoulder Flexion AAROM  - 2 x daily - 7 x weekly - 1 sets - 5 reps - Seated Shoulder Flexion Towel Slide at Table Top  - 2 x daily - 7 x weekly - 1 sets - 5 reps  ASSESSMENT:  CLINICAL IMPRESSION: 07/20/2024: Pt arrives w/ report of continued slow/steady improvement, no issues after last session. Continuing to work on [employer] with more AAROM focus given good response to last session. Does well, requires rest breaks and low repetitions throughout due to muscular fatigue, limited primarily by heaviness in arm. ROM is doing well within protocol and no increases in pain during or after session.  Continued post op education on rehab protocol. No adverse events. Recommend continuing along current POC in order to address relevant deficits and improve functional tolerance. Pt departs today's session in no acute distress, all voiced questions/concerns addressed appropriately from PT perspective.     Per eval: Patient is a 73 y.o. female who was seen today for physical therapy evaluation and treatment for s/p Rt lateralized RTSA on 06/29/24. She demonstrates Rt shoulder ROM and strength deficits that are consistent with her recent post-operative status. She will benefit from skilled PT to assist in restoring her RUE strength and ROM in order to optimize her function and assist in overall pain reduction.    OBJECTIVE IMPAIRMENTS: decreased activity tolerance, decreased knowledge of condition, decreased knowledge of use of DME, decreased ROM, decreased strength, increased edema, impaired UE functional use, postural dysfunction, and pain.   ACTIVITY LIMITATIONS: carrying, lifting, transfers, bed mobility, bathing, toileting, dressing, self feeding, reach over head, and hygiene/grooming  PARTICIPATION LIMITATIONS: meal prep, cleaning, laundry, driving, shopping, community activity, and yard work  PERSONAL FACTORS: Age, Fitness, Time since onset of injury/illness/exacerbation, and 3+ comorbidities: see PMH above are also affecting patient's functional outcome.   REHAB POTENTIAL: Good  CLINICAL DECISION MAKING: Stable/uncomplicated  EVALUATION COMPLEXITY: Low  GOALS: Goals reviewed with patient? Yes  SHORT TERM GOALS: Target date: 08/12/2024    Patient will be independent and compliant with initial HEP.   Baseline: initial HEP issued  Goal status: INITIAL  2.  Patient will demonstrate at least 120 degrees of Rt shoulder flexion PROM to facilitate progression towards AROM activity.  Baseline: see above Goal status: INITIAL  3.  Patient will demonstrate at least 30 degrees of Rt shoulder  ER PROM to facilitate progression towards AROM activity.  Baseline: see above  Goal status: INITIAL   LONG TERM GOALS: Target date: 09/23/2024    Patient will demonstrate at least 140 degrees of shoulder flexion AROM without shrug to improve ability to reach into cabinets.  Baseline: AROM deferred  Goal status: INITIAL  2.  Patient will be able to complete sewing activity with minimal/no Rt shoulder pain.  Baseline: unable  Goal status: INITIAL  3.  Patient will demonstrate at least 45 degrees of Rt shoulder ER AROM to improve ability to complete self-care activities.  Baseline: deferred Goal status: INITIAL  4.  Patient will demonstrate at least 90 degrees of Rt shoulder abduction AROM to improve ability to complete reaching activities.  Baseline: deferred Goal status: INITIAL  5.  Patient will score </= 20 % disability on the QuickDASH (MCID is 8-15.9) to signify clinically meaningful improvement in functional abilities.   Baseline: see above Goal status: INITIAL   PLAN: PT FREQUENCY: 2x/week  PT DURATION: 12 weeks  PLANNED INTERVENTIONS: 97164- PT Re-evaluation, 97750- Physical Performance Testing, 97110-Therapeutic exercises, 97530- Therapeutic activity, [id number]- Neuromuscular re-education, 97535- Self Care, [id number]- Manual therapy, 97016- Vasopneumatic device, 20560 (1-2 muscles), 20561 (3+ muscles)- Dry Needling, Cryotherapy, and Moist heat  PLAN FOR NEXT SESSION: AAROM/PROM within pt tolerance and post op protocol. Grip strengthening, elbow/wrist ROM. Game ready.   Alm DELENA Jenny PT, DPT 07/20/2024 11:46 AM

## 2024-07-26 ENCOUNTER — Ambulatory Visit: Attending: Orthopaedic Surgery

## 2024-07-26 ENCOUNTER — Encounter: Payer: Self-pay | Admitting: Sports Medicine

## 2024-07-26 DIAGNOSIS — M25611 Stiffness of right shoulder, not elsewhere classified: Secondary | ICD-10-CM | POA: Insufficient documentation

## 2024-07-26 DIAGNOSIS — M25511 Pain in right shoulder: Secondary | ICD-10-CM | POA: Insufficient documentation

## 2024-07-26 DIAGNOSIS — R600 Localized edema: Secondary | ICD-10-CM | POA: Insufficient documentation

## 2024-07-26 DIAGNOSIS — M6281 Muscle weakness (generalized): Secondary | ICD-10-CM | POA: Insufficient documentation

## 2024-07-26 NOTE — Therapy (Signed)
 OUTPATIENT PHYSICAL THERAPY TREATMENT   Patient Name: Melissa Rodgers MRN: [id number] DOB:Jun 25, 1951, 73 y.o., female Today's Date: 07/26/2024  END OF SESSION:  PT End of Session - 07/26/24 1101     Visit Number 7    Number of Visits 24    Date for PT Re-Evaluation 09/23/24    Authorization Type MCR    Progress Note Due on Visit 10    PT Start Time 1101    PT Stop Time 1145    PT Time Calculation (min) 44 min    Activity Tolerance Patient tolerated treatment well    Behavior During Therapy [facility] for tasks assessed/performed                Past Medical History:  Diagnosis Date   Allergy    seasonal   Cataract 2024   Chronic bilateral low back pain without sciatica 10/07/2016   DDD (degenerative disc disease), cervical 12/03/2020   Diverticulitis of sigmoid colon 08/01/2019   Dysrhythmia    Afib   Essential hypertension 09/09/2018   Family history of diabetes mellitus 06/09/2022   Fibromyalgia    Gastroesophageal reflux disease without esophagitis 11/06/2015   Generalized anxiety disorder with panic attacks 08/01/2019   Hyperlipidemia 09/09/2018   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   Lumbosacral spondylosis 10/07/2016   Mass of lower inner quadrant of right breast 07/11/2022   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 Onetha Larger, [facility]. polyp 2 mm in the sigmoid colon, ( Polypectomy). Moderate diverticulosis of the descending colon and sigmoid colon. Lipoma in the ascending colon. Internal hemorrhoids.   ESOPHAGOGASTRODUODENOSCOPY     possibly last one was 2017. In [place]   HERNIA REPAIR     JOINT REPLACEMENT     MEDIAL  PARTIAL KNEE REPLACEMENT Right 12/14/2014   NISSEN FUNDOPLICATION  02/21/2016   OOPHORECTOMY     PARTIAL KNEE ARTHROPLASTY Left 04/05/2014   TOTAL KNEE REVISION Left 05/10/2021   Procedure: TOTAL KNEE REVISION;  Surgeon: Yvone Rush, MD;  Location: [facility];  Service: Orthopedics;  Laterality: Left;   TUBAL LIGATION     Patient Active Problem List   Diagnosis Date Noted   Mild coronary artery disease 05/04/2024   Preoperative cardiovascular examination 05/04/2024   Polyarthralgia with elevated ESR 03/15/2024   Rotator cuff tear, right 01/29/2024   Allergy    Mass of lower inner quadrant of right breast 07/11/2022   Family history of diabetes mellitus 06/09/2022   Paroxysmal atrial fibrillation (HCC) 07/15/2021   DDD (degenerative disc disease), cervical 12/03/2020   Fibromyalgia 03/05/2020   Diverticulitis of sigmoid colon 08/01/2019   Generalized anxiety disorder with panic attacks 08/01/2019   Non-seasonal allergic rhinitis 09/09/2018   Essential hypertension 09/09/2018   Mixed dyslipidemia 09/09/2018   Lumbosacral spondylosis 10/07/2016   Primary osteoarthritis of both knees post bilateral hemiarthroplasty 10/07/2016   Chronic bilateral low back pain without sciatica 10/07/2016   Atrial fibrillation 04/17/2016   S/P laparoscopic fundoplication 03/17/2016   Gastroesophageal reflux disease without esophagitis 11/06/2015   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    PCP: Willo Mini, NP  REFERRING PROVIDER: Cristy Bonner DASEN, MD  REFERRING DIAG: Right lateralized reverse total shoulder arthroplasty   THERAPY DIAG:  Acute pain  of right shoulder  Stiffness of right shoulder, not elsewhere classified  Muscle weakness (generalized)  Localized edema  Rationale for Evaluation and Treatment: Rehabilitation  ONSET DATE: 06/29/24  SUBJECTIVE:                                                                                                                                                                                      Per eval: Patient underwent Rt lateralized RTSA surgery on 06/29/24. Right now the pain is pretty good since taking tylenol  and ibuprofen this morning. Pain has ebbed and flowed over the past 2 days. Does have some tingling in the thumb currently. All of her hobbies involve her arms including crafting and gardening. Wants to be able to use her sewing machine and is having difficulty with basic daily tasks right now.  Hand dominance: Left  SUBJECTIVE STATEMENT: Patient reports the shoulder is not too bad. Has still been sleeping in the recliner. Has f/u with PA on Friday. Patient reports 2 days ago she moved the shoulder and had immediate pain in the shoulder and since then has noticed occasionally more difficulty with the exercises.     PERTINENT HISTORY: Fibromyalgia  A-fib Chronic low back pain   PAIN:  Are you having pain? Yes: NPRS scale: 4 Pain location: Rt shoulder Pain description: sore Aggravating factors: movement Relieving factors: medication  PRECAUTIONS: Other: see protocol for specific ROM and lifting restrictions  RED FLAGS: None   WEIGHT BEARING RESTRICTIONS: Yes RUE < 1lb   FALLS:  Has patient fallen in last 6 months? No  LIVING ENVIRONMENT: Lives with: lives with their spouse Lives in: House/apartment Stairs: Yes: External: 8 steps; can reach both Has following equipment at home: Quad cane small base  OCCUPATION: Retired   PLOF: Independent  PATIENT GOALS: I want to use my arm again.   NEXT MD VISIT: September 2025  OBJECTIVE:  Note: Objective measures were completed at Evaluation unless otherwise noted.  DIAGNOSTIC FINDINGS:  None on file   PATIENT SURVEYS :  Quick DASH: 81.8% disability   COGNITION: Overall cognitive status: Within functional limits for tasks assessed     SENSATION: Not tested  POSTURE: Rt arm in sling, forward shoulders   UPPER EXTREMITY ROM:   Passive ROM Right eval Left eval  07/08/24 Right  PROM 07/13/24 Right PROM 07/20/24 R PROM  Shoulder flexion 30   50 89   Shoulder extension       Shoulder abduction       Shoulder adduction       Shoulder internal rotation       Shoulder external rotation 0    12 10 deg  Elbow flexion       Elbow extension Full       Wrist flexion       Wrist extension       Wrist ulnar deviation       Wrist radial deviation       Wrist pronation       Wrist supination       (Blank rows = not tested)  UPPER EXTREMITY MMT: deferred on eval due to post-op acuity   MMT Right eval Left eval  Shoulder flexion    Shoulder extension    Shoulder abduction    Shoulder adduction    Shoulder internal rotation    Shoulder external rotation    Middle trapezius    Lower trapezius    Elbow flexion    Elbow extension    Wrist flexion    Wrist extension    Wrist ulnar deviation    Wrist radial deviation    Wrist pronation    Wrist supination    Grip strength (lbs)    (Blank rows = not tested)    PALPATION:  Not assessed    [facility] Adult PT Treatment:                                                DATE: 07/26/24 Therapeutic Exercise: Reviewed and updated HEP.  Manual Therapy: Rt shoulder ER PROM to tolerance   Therapeutic Activity: Standing shoulder flexion AAROM with SPC 2 x 5  Standing shoulder abduction AAROM with SPC 2 x 5  Pulleys d/c due to pain Standing ER AAROM with SPC d/c due to pain   Self Care: Discussed rehab protocol which includes discontinuing sling at 4 weeks and ongoing movement restrictions; printed copy for patient   [facility] Adult PT Treatment:                                                DATE: 07/20/24 Therapeutic Exercise: Seated passive shoulder slides 2x8 cues for appropriate ROM Supine short lever AAROM (hands clasped) 2x5 cues for comfortable ROM (~60deg) and minimizing elbow flare  Supine shoulder ER PROM (PT assisted to maintain appropriate ROM) <15 deg x8 Standing swiss ball flexion AAROM 2x5  cues for setup, shoulder<90 deg HEP discussion/education  Self Care: Continued reinforcement of post op rehab protocol, movement restrictions, appropriate positioning   [facility] Adult PT Treatment:                                                DATE: 07/18/24 Therapeutic Exercise: Seated passive shoulder slides in scapular plane 3x5 <90 cues for comfortable ROM Standing AAROM shoulder flexion at table (itsy bitsy spider) x5 Standing passive shoulder flexion walkbacks x5 cues to avoid WB Seated passive shoulder flexion (sliding on legs) 2x5 cues for appropriate ROM (stopping above mid shin) HEP education/discussion  Self Care: Continued education re: post op rehab protocol, reinforcing precautions, ensuring appropriate setup w/ home program   [facility] Adult PT Treatment:  DATE: 07/13/24 Therapeutic Exercise: Supine shoulder flexion AAROM with wrist clasp x 5 Seated and standing towel slides at plinth in flexion attempted d/c due to pain/form  Manual Therapy: Rt shoulder flexion, ER PROM within post-op parameters; Rt elbow PROM all planes to tolerance   Self Care: Reviewed post-op restrictions.       PATIENT EDUCATION: Education details: see treatment Person educated: Patient Education method: Explanation, Demonstration, Tactile cues, and Verbal cues, handout  Education comprehension: verbalized understanding, returned demonstration, verbal cues required, tactile cues required, and needs further education  HOME EXERCISE PROGRAM: Access Code: [facility] URL: [email or web address] Date: 07/26/2024 Prepared by: Lucie Meeter  Exercises - Putty Squeezes  - 3 x daily - 7 x weekly - 1 sets - 10 reps - Wrist Flexion AROM  - 3 x daily - 7 x weekly - 1 sets - 10 reps - Wrist Extension AROM  - 3 x daily - 7 x weekly - 1 sets - 10 reps - Seated Elbow Flexion and Extension AROM  - 3 x daily - 7 x weekly - 1 sets - 10 reps - Seated  Shoulder Flexion Towel Slide at Table Top  - 2 x daily - 7 x weekly - 1 sets - 5 reps - Shoulder Flexion Overhead with Dowel  - 2 x daily - 7 x weekly - 2 sets - 5 reps - Standing Shoulder Abduction AAROM with Dowel  - 2 x daily - 7 x weekly - 2 sets - 5 reps  ASSESSMENT:  CLINICAL IMPRESSION: Patient will be 4 weeks post-op tomorrow so we discussed next phase of rehab that begins at 4 weeks including discontinuing sling and ongoing movement restrictions with patient verbalizing understanding. We progressed AAROM into standing as patient reports difficulty completing supine AAROM as part of HEP. She did well with flexion and abduction AAROM through partial range, so these were added to HEP. Did not tolerate standing ER or pulleys as having the elbow maintained at 90 degrees of flexion caused lateral upper arm pain, so these were discontinued.    Per eval: Patient is a 73 y.o. female who was seen today for physical therapy evaluation and treatment for s/p Rt lateralized RTSA on 06/29/24. She demonstrates Rt shoulder ROM and strength deficits that are consistent with her recent post-operative status. She will benefit from skilled PT to assist in restoring her RUE strength and ROM in order to optimize her function and assist in overall pain reduction.    OBJECTIVE IMPAIRMENTS: decreased activity tolerance, decreased knowledge of condition, decreased knowledge of use of DME, decreased ROM, decreased strength, increased edema, impaired UE functional use, postural dysfunction, and pain.   ACTIVITY LIMITATIONS: carrying, lifting, transfers, bed mobility, bathing, toileting, dressing, self feeding, reach over head, and hygiene/grooming  PARTICIPATION LIMITATIONS: meal prep, cleaning, laundry, driving, shopping, community activity, and yard work  PERSONAL FACTORS: Age, Fitness, Time since onset of injury/illness/exacerbation, and 3+ comorbidities: see PMH above are also affecting patient's functional  outcome.   REHAB POTENTIAL: Good  CLINICAL DECISION MAKING: Stable/uncomplicated  EVALUATION COMPLEXITY: Low  GOALS: Goals reviewed with patient? Yes  SHORT TERM GOALS: Target date: 08/12/2024    Patient will be independent and compliant with initial HEP.   Baseline: initial HEP issued  Goal status: INITIAL  2.  Patient will demonstrate at least 120 degrees of Rt shoulder flexion PROM to facilitate progression towards AROM activity.  Baseline: see above Goal status: INITIAL  3.  Patient will demonstrate at least 30 degrees of Rt  shoulder ER PROM to facilitate progression towards AROM activity.  Baseline: see above  Goal status: INITIAL   LONG TERM GOALS: Target date: 09/23/2024    Patient will demonstrate at least 140 degrees of shoulder flexion AROM without shrug to improve ability to reach into cabinets.  Baseline: AROM deferred  Goal status: INITIAL  2.  Patient will be able to complete sewing activity with minimal/no Rt shoulder pain.  Baseline: unable  Goal status: INITIAL  3.  Patient will demonstrate at least 45 degrees of Rt shoulder ER AROM to improve ability to complete self-care activities.  Baseline: deferred Goal status: INITIAL  4.  Patient will demonstrate at least 90 degrees of Rt shoulder abduction AROM to improve ability to complete reaching activities.  Baseline: deferred Goal status: INITIAL  5.  Patient will score </= 20 % disability on the QuickDASH (MCID is 8-15.9) to signify clinically meaningful improvement in functional abilities.   Baseline: see above Goal status: INITIAL   PLAN: PT FREQUENCY: 2x/week  PT DURATION: 12 weeks  PLANNED INTERVENTIONS: 97164- PT Re-evaluation, 97750- Physical Performance Testing, 97110-Therapeutic exercises, 97530- Therapeutic activity, [id number]- Neuromuscular re-education, 97535- Self Care, [id number]- Manual therapy, 97016- Vasopneumatic device, 20560 (1-2 muscles), 20561 (3+ muscles)- Dry Needling,  Cryotherapy, and Moist heat  PLAN FOR NEXT SESSION: AAROM/PROM within pt tolerance and post op protocol. Grip strengthening, elbow/wrist ROM. Game ready prn.   Jakia Kennebrew, PT, DPT, ATC 07/26/24 11:45 AM

## 2024-07-29 ENCOUNTER — Ambulatory Visit

## 2024-07-29 DIAGNOSIS — M25511 Pain in right shoulder: Secondary | ICD-10-CM | POA: Diagnosis not present

## 2024-07-29 DIAGNOSIS — R600 Localized edema: Secondary | ICD-10-CM

## 2024-07-29 DIAGNOSIS — M19011 Primary osteoarthritis, right shoulder: Secondary | ICD-10-CM | POA: Diagnosis not present

## 2024-07-29 DIAGNOSIS — M6281 Muscle weakness (generalized): Secondary | ICD-10-CM | POA: Diagnosis not present

## 2024-07-29 DIAGNOSIS — M25611 Stiffness of right shoulder, not elsewhere classified: Secondary | ICD-10-CM

## 2024-07-29 NOTE — Therapy (Signed)
 OUTPATIENT PHYSICAL THERAPY TREATMENT   Patient Name: Melissa Rodgers MRN: [id number] DOB:19-Dec-1950, 73 y.o., female Today's Date: 07/29/2024  END OF SESSION:  PT End of Session - 07/29/24 1016     Visit Number 8    Number of Visits 24    Date for PT Re-Evaluation 09/23/24    Authorization Type MCR    Progress Note Due on Visit 10    PT Start Time 1017    PT Stop Time 1055    PT Time Calculation (min) 38 min    Activity Tolerance Patient limited by pain    Behavior During Therapy [facility] for tasks assessed/performed                 Past Medical History:  Diagnosis Date   Allergy    seasonal   Cataract 2024   Chronic bilateral low back pain without sciatica 10/07/2016   DDD (degenerative disc disease), cervical 12/03/2020   Diverticulitis of sigmoid colon 08/01/2019   Dysrhythmia    Afib   Essential hypertension 09/09/2018   Family history of diabetes mellitus 06/09/2022   Fibromyalgia    Gastroesophageal reflux disease without esophagitis 11/06/2015   Generalized anxiety disorder with panic attacks 08/01/2019   Hyperlipidemia 09/09/2018   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   Lumbosacral spondylosis 10/07/2016   Mass of lower inner quadrant of right breast 07/11/2022   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 Onetha Larger, [facility]. polyp 2 mm in the sigmoid colon, ( Polypectomy). Moderate diverticulosis of the descending colon and sigmoid colon. Lipoma in the ascending colon. Internal hemorrhoids.   ESOPHAGOGASTRODUODENOSCOPY     possibly last one was 2017. In [place]   HERNIA REPAIR     JOINT REPLACEMENT     MEDIAL PARTIAL  KNEE REPLACEMENT Right 12/14/2014   NISSEN FUNDOPLICATION  02/21/2016   OOPHORECTOMY     PARTIAL KNEE ARTHROPLASTY Left 04/05/2014   TOTAL KNEE REVISION Left 05/10/2021   Procedure: TOTAL KNEE REVISION;  Surgeon: Yvone Rush, MD;  Location: [facility];  Service: Orthopedics;  Laterality: Left;   TUBAL LIGATION     Patient Active Problem List   Diagnosis Date Noted   Mild coronary artery disease 05/04/2024   Preoperative cardiovascular examination 05/04/2024   Polyarthralgia with elevated ESR 03/15/2024   Rotator cuff tear, right 01/29/2024   Allergy    Mass of lower inner quadrant of right breast 07/11/2022   Family history of diabetes mellitus 06/09/2022   Paroxysmal atrial fibrillation (HCC) 07/15/2021   DDD (degenerative disc disease), cervical 12/03/2020   Fibromyalgia 03/05/2020   Diverticulitis of sigmoid colon 08/01/2019   Generalized anxiety disorder with panic attacks 08/01/2019   Non-seasonal allergic rhinitis 09/09/2018   Essential hypertension 09/09/2018   Mixed dyslipidemia 09/09/2018   Lumbosacral spondylosis 10/07/2016   Primary osteoarthritis of both knees post bilateral hemiarthroplasty 10/07/2016   Chronic bilateral low back pain without sciatica 10/07/2016   Atrial fibrillation 04/17/2016   S/P laparoscopic fundoplication 03/17/2016   Gastroesophageal reflux disease without esophagitis 11/06/2015   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    PCP: Willo Mini, NP  REFERRING PROVIDER: Cristy Bonner DASEN, MD  REFERRING DIAG: Right lateralized reverse total shoulder arthroplasty   THERAPY DIAG:  Acute  pain of right shoulder  Stiffness of right shoulder, not elsewhere classified  Muscle weakness (generalized)  Localized edema  Rationale for Evaluation and Treatment: Rehabilitation  ONSET DATE: 06/29/24  SUBJECTIVE:                                                                                                                                                                                      Per eval: Patient underwent Rt lateralized RTSA surgery on 06/29/24. Right now the pain is pretty good since taking tylenol  and ibuprofen this morning. Pain has ebbed and flowed over the past 2 days. Does have some tingling in the thumb currently. All of her hobbies involve her arms including crafting and gardening. Wants to be able to use her sewing machine and is having difficulty with basic daily tasks right now.  Hand dominance: Left  SUBJECTIVE STATEMENT: Patient reports the shoulder still hurts with specific movements (ER and elbow flexion) ever since she reached with her arm over the weekend. She has post-op f/u today.     PERTINENT HISTORY: Fibromyalgia  A-fib Chronic low back pain   PAIN:  Are you having pain? Yes: NPRS scale: none currently; at worst 6 Pain location: Rt shoulder Pain description: sore Aggravating factors: movement Relieving factors: medication  PRECAUTIONS: Other: see protocol for specific ROM and lifting restrictions  RED FLAGS: None   WEIGHT BEARING RESTRICTIONS: Yes RUE < 1lb   FALLS:  Has patient fallen in last 6 months? No  LIVING ENVIRONMENT: Lives with: lives with their spouse Lives in: House/apartment Stairs: Yes: External: 8 steps; can reach both Has following equipment at home: Quad cane small base  OCCUPATION: Retired   PLOF: Independent  PATIENT GOALS: I want to use my arm again.   NEXT MD VISIT: September 2025  OBJECTIVE:  Note: Objective measures were completed at Evaluation unless otherwise noted.  DIAGNOSTIC FINDINGS:  None on file   PATIENT SURVEYS :  Quick DASH: 81.8% disability   COGNITION: Overall cognitive status: Within functional limits for tasks assessed     SENSATION: Not tested  POSTURE: Rt arm in sling, forward shoulders   UPPER EXTREMITY ROM:   Passive ROM Right eval Left eval 07/08/24 Right  PROM 07/13/24 Right PROM 07/20/24 R PROM 07/29/24 PROM Right   Shoulder flexion 30    50 89  88  Shoulder extension        Shoulder abduction        Shoulder adduction        Shoulder internal rotation        Shoulder external rotation 0    12 10 deg 0 pain   Elbow flexion  Elbow extension Full        Wrist flexion        Wrist extension        Wrist ulnar deviation        Wrist radial deviation        Wrist pronation        Wrist supination        (Blank rows = not tested)  UPPER EXTREMITY MMT: deferred on eval due to post-op acuity   MMT Right eval Left eval  Shoulder flexion    Shoulder extension    Shoulder abduction    Shoulder adduction    Shoulder internal rotation    Shoulder external rotation    Middle trapezius    Lower trapezius    Elbow flexion    Elbow extension    Wrist flexion    Wrist extension    Wrist ulnar deviation    Wrist radial deviation    Wrist pronation    Wrist supination    Grip strength (lbs)    (Blank rows = not tested)    PALPATION:  Not assessed  [facility] Adult PT Treatment:                                                DATE: 07/29/24 Therapeutic Exercise: ROM measurements Reviewed and updated HEP   Therapeutic Activity: Standing shoulder abduction AAROM with dowel x 10 Standing shoulder flexion AAROM with dowel d/c due to pain Standing shoulder flexion AAROM with hand clasp x 5  Self Care: Reviewed post-op protocol with lengthy discussion regarding ROM restrictions   [facility] Adult PT Treatment:                                                DATE: 07/26/24 Therapeutic Exercise: Reviewed and updated HEP.  Manual Therapy: Rt shoulder ER PROM to tolerance   Therapeutic Activity: Standing shoulder flexion AAROM with SPC 2 x 5  Standing shoulder abduction AAROM with SPC 2 x 5  Pulleys d/c due to pain Standing ER AAROM with SPC d/c due to pain   Self Care: Discussed rehab protocol which includes discontinuing sling at 4 weeks and ongoing movement restrictions; printed copy for patient   [facility] Adult PT  Treatment:                                                DATE: 07/20/24 Therapeutic Exercise: Seated passive shoulder slides 2x8 cues for appropriate ROM Supine short lever AAROM (hands clasped) 2x5 cues for comfortable ROM (~60deg) and minimizing elbow flare  Supine shoulder ER PROM (PT assisted to maintain appropriate ROM) <15 deg x8 Standing swiss ball flexion AAROM 2x5 cues for setup, shoulder<90 deg HEP discussion/education  Self Care: Continued reinforcement of post op rehab protocol, movement restrictions, appropriate positioning   [facility] Adult PT Treatment:  DATE: 07/18/24 Therapeutic Exercise: Seated passive shoulder slides in scapular plane 3x5 <90 cues for comfortable ROM Standing AAROM shoulder flexion at table (itsy bitsy spider) x5 Standing passive shoulder flexion walkbacks x5 cues to avoid WB Seated passive shoulder flexion (sliding on legs) 2x5 cues for appropriate ROM (stopping above mid shin) HEP education/discussion  Self Care: Continued education re: post op rehab protocol, reinforcing precautions, ensuring appropriate setup w/ home program   [facility] Adult PT Treatment:                                                DATE: 07/13/24 Therapeutic Exercise: Supine shoulder flexion AAROM with wrist clasp x 5 Seated and standing towel slides at plinth in flexion attempted d/c due to pain/form  Manual Therapy: Rt shoulder flexion, ER PROM within post-op parameters; Rt elbow PROM all planes to tolerance   Self Care: Reviewed post-op restrictions.       PATIENT EDUCATION: Education details: see treatment Person educated: Patient Education method: Explanation, Demonstration, Tactile cues, and Verbal cues, handout  Education comprehension: verbalized understanding, returned demonstration, verbal cues required, tactile cues required, and needs further education  HOME EXERCISE PROGRAM: Access Code: [facility] URL:  [email or web address] Date: 07/29/2024 Prepared by: Lucie Meeter  Exercises - Putty Squeezes  - 3 x daily - 7 x weekly - 1 sets - 10 reps - Wrist Flexion AROM  - 3 x daily - 7 x weekly - 1 sets - 10 reps - Wrist Extension AROM  - 3 x daily - 7 x weekly - 1 sets - 10 reps - Seated Shoulder Flexion Towel Slide at Table Top  - 2 x daily - 7 x weekly - 1 sets - 5 reps - Standing Shoulder Abduction AAROM with Dowel  - 2 x daily - 7 x weekly - 2 sets - 5 reps - Seated Shoulder Flexion Self PROM  - 2 x daily - 7 x weekly - 2 sets - 5 reps  ASSESSMENT:  CLINICAL IMPRESSION: Patient is 4 weeks s/p Rt lateralized RTSA reporting ongoing pain that began over the weekend with a reaching maneuver. Shoulder ER PROM has regressed compared to previous measurements with patient reporting immediate pain in the shoulder when attempted. Passive shoulder flexion remains relatively unchanged compared to previous measurement. Was recommended to discuss this new onset of pain and limited ROM at f/u appointment today with patient verbalizing understanding. She did not tolerate shoulder flexion AAROM with dowel, but did well with hand clasp AAROM so this was added to HEP.    Per eval: Patient is a 73 y.o. female who was seen today for physical therapy evaluation and treatment for s/p Rt lateralized RTSA on 06/29/24. She demonstrates Rt shoulder ROM and strength deficits that are consistent with her recent post-operative status. She will benefit from skilled PT to assist in restoring her RUE strength and ROM in order to optimize her function and assist in overall pain reduction.    OBJECTIVE IMPAIRMENTS: decreased activity tolerance, decreased knowledge of condition, decreased knowledge of use of DME, decreased ROM, decreased strength, increased edema, impaired UE functional use, postural dysfunction, and pain.   ACTIVITY LIMITATIONS: carrying, lifting, transfers, bed mobility, bathing, toileting, dressing,  self feeding, reach over head, and hygiene/grooming  PARTICIPATION LIMITATIONS: meal prep, cleaning, laundry, driving, shopping, community activity, and yard work  PERSONAL FACTORS: Age, Fitness, Time  since onset of injury/illness/exacerbation, and 3+ comorbidities: see PMH above are also affecting patient's functional outcome.   REHAB POTENTIAL: Good  CLINICAL DECISION MAKING: Stable/uncomplicated  EVALUATION COMPLEXITY: Low  GOALS: Goals reviewed with patient? Yes  SHORT TERM GOALS: Target date: 08/12/2024    Patient will be independent and compliant with initial HEP.   Baseline: initial HEP issued  Goal status: INITIAL  2.  Patient will demonstrate at least 120 degrees of Rt shoulder flexion PROM to facilitate progression towards AROM activity.  Baseline: see above Goal status: INITIAL  3.  Patient will demonstrate at least 30 degrees of Rt shoulder ER PROM to facilitate progression towards AROM activity.  Baseline: see above  Goal status: INITIAL   LONG TERM GOALS: Target date: 09/23/2024    Patient will demonstrate at least 140 degrees of shoulder flexion AROM without shrug to improve ability to reach into cabinets.  Baseline: AROM deferred  Goal status: INITIAL  2.  Patient will be able to complete sewing activity with minimal/no Rt shoulder pain.  Baseline: unable  Goal status: INITIAL  3.  Patient will demonstrate at least 45 degrees of Rt shoulder ER AROM to improve ability to complete self-care activities.  Baseline: deferred Goal status: INITIAL  4.  Patient will demonstrate at least 90 degrees of Rt shoulder abduction AROM to improve ability to complete reaching activities.  Baseline: deferred Goal status: INITIAL  5.  Patient will score </= 20 % disability on the QuickDASH (MCID is [id number]) to signify clinically meaningful improvement in functional abilities.   Baseline: see above Goal status: INITIAL   PLAN: PT FREQUENCY: 2x/week  PT DURATION:  12 weeks  PLANNED INTERVENTIONS: 97164- PT Re-evaluation, 97750- Physical Performance Testing, 97110-Therapeutic exercises, 97530- Therapeutic activity, [id number]- Neuromuscular re-education, 97535- Self Care, [id number]- Manual therapy, 97016- Vasopneumatic device, 20560 (1-2 muscles), 20561 (3+ muscles)- Dry Needling, Cryotherapy, and Moist heat  PLAN FOR NEXT SESSION: AAROM/PROM within pt tolerance and post op protocol. Game ready prn. MD f/u?  Willadeen Colantuono, PT, DPT, ATC 07/29/24 11:00 AM

## 2024-08-02 ENCOUNTER — Ambulatory Visit

## 2024-08-04 ENCOUNTER — Ambulatory Visit

## 2024-08-09 ENCOUNTER — Ambulatory Visit

## 2024-08-11 ENCOUNTER — Ambulatory Visit

## 2024-08-12 DIAGNOSIS — M19011 Primary osteoarthritis, right shoulder: Secondary | ICD-10-CM | POA: Diagnosis not present

## 2024-08-15 NOTE — Therapy (Signed)
 OUTPATIENT PHYSICAL THERAPY RE-ASSESSMENT + PROGRESS NOTE   Patient Name: Melissa Rodgers MRN: [id number] DOB:12-Apr-1951, 73 y.o., female Today's Date: 08/16/2024   Progress Note Reporting Period 07/01/24 to 08/16/24  See note below for Objective Data and Assessment of Progress/Goals.      END OF SESSION:  PT End of Session - 08/16/24 1100     Visit Number 9    Number of Visits 24    Date for Recertification  09/23/24    Authorization Type MCR    Progress Note Due on Visit 19    PT Start Time 1102    PT Stop Time 1143    PT Time Calculation (min) 41 min                  Past Medical History:  Diagnosis Date   Allergy    seasonal   Cataract 2024   Chronic bilateral low back pain without sciatica 10/07/2016   DDD (degenerative disc disease), cervical 12/03/2020   Diverticulitis of sigmoid colon 08/01/2019   Dysrhythmia    Afib   Essential hypertension 09/09/2018   Family history of diabetes mellitus 06/09/2022   Fibromyalgia    Gastroesophageal reflux disease without esophagitis 11/06/2015   Generalized anxiety disorder with panic attacks 08/01/2019   Hyperlipidemia 09/09/2018   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   Lumbosacral spondylosis 10/07/2016   Mass of lower inner quadrant of right breast 07/11/2022   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 Onetha Larger, [facility]. polyp 2 mm in the sigmoid colon, ( Polypectomy). Moderate diverticulosis of the descending colon and sigmoid colon. Lipoma in the ascending colon. Internal hemorrhoids.   ESOPHAGOGASTRODUODENOSCOPY     possibly last one was 2017. In [place]   HERNIA REPAIR      JOINT REPLACEMENT     MEDIAL PARTIAL KNEE REPLACEMENT Right 12/14/2014   NISSEN FUNDOPLICATION  02/21/2016   OOPHORECTOMY     PARTIAL KNEE ARTHROPLASTY Left 04/05/2014   TOTAL KNEE REVISION Left 05/10/2021   Procedure: TOTAL KNEE REVISION;  Surgeon: Yvone Rush, MD;  Location: [facility];  Service: Orthopedics;  Laterality: Left;   TUBAL LIGATION     Patient Active Problem List   Diagnosis Date Noted   Mild coronary artery disease 05/04/2024   Preoperative cardiovascular examination 05/04/2024   Polyarthralgia with elevated ESR 03/15/2024   Rotator cuff tear, right 01/29/2024   Allergy    Mass of lower inner quadrant of right breast 07/11/2022   Family history of diabetes mellitus 06/09/2022   Paroxysmal atrial fibrillation (HCC) 07/15/2021   DDD (degenerative disc disease), cervical 12/03/2020   Fibromyalgia 03/05/2020   Diverticulitis of sigmoid colon 08/01/2019   Generalized anxiety disorder with panic attacks 08/01/2019   Non-seasonal allergic rhinitis 09/09/2018   Essential hypertension 09/09/2018   Mixed dyslipidemia 09/09/2018   Lumbosacral spondylosis 10/07/2016   Primary osteoarthritis of both knees post bilateral hemiarthroplasty 10/07/2016   Chronic bilateral low back pain without sciatica 10/07/2016   Atrial fibrillation 04/17/2016   S/P laparoscopic fundoplication 03/17/2016   Gastroesophageal reflux disease without esophagitis 11/06/2015   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    PCP: Willo Mini, NP  REFERRING PROVIDER: Cristy Bonner DASEN, MD  REFERRING DIAG: Right lateralized reverse total  shoulder arthroplasty   THERAPY DIAG:  Acute pain of right shoulder  Stiffness of right shoulder, not elsewhere classified  Muscle weakness (generalized)  Localized edema  Rationale for Evaluation and Treatment: Rehabilitation  ONSET DATE: 06/29/24  SUBJECTIVE:                                                                                                                                                                                      Per eval: Patient underwent Rt lateralized RTSA surgery on 06/29/24. Right now the pain is pretty good since taking tylenol  and ibuprofen this morning. Pain has ebbed and flowed over the past 2 days. Does have some tingling in the thumb currently. All of her hobbies involve her arms including crafting and gardening. Wants to be able to use her sewing machine and is having difficulty with basic daily tasks right now.  Hand dominance: Left  SUBJECTIVE STATEMENT: 08/16/2024: Pt arrives after gap in care without sling - states she was placed on hold over concern for acromial stress reaction and possible disruption of her biceps repair. States at most recent follow up they cleared her to return to PT without specific restriction outside usual protocol, but was advised to take it real slow and easy. At most recent follow up she states they were reassured regarding her acromial pain as it has improved greatly. States she feels improved as well.   PERTINENT HISTORY: Fibromyalgia  A-fib Chronic low back pain   PAIN:  Are you having pain? Yes: NPRS scale: none currently; at worst 4 Pain location: Rt shoulder Pain description: sore Aggravating factors: movement Relieving factors: medication  PRECAUTIONS: Other: see protocol for specific ROM and lifting restrictions  RED FLAGS: None   WEIGHT BEARING RESTRICTIONS: Yes RUE < 1lb   FALLS:  Has patient fallen in last 6 months? No  LIVING ENVIRONMENT: Lives with: lives with their spouse Lives in: House/apartment Stairs: Yes: External: 8 steps; can reach both Has following equipment at home: Quad cane small base  OCCUPATION: Retired   PLOF: Independent  PATIENT GOALS: I want to use my arm again.   NEXT MD VISIT: Oct 31  OBJECTIVE:  Note: Objective measures were completed at Evaluation unless otherwise noted.  DIAGNOSTIC FINDINGS:  None on file   PATIENT SURVEYS :  Quick DASH:  81.8% disability   08/16/24 QuickDASH 52.3%   COGNITION: Overall cognitive status: Within functional limits for tasks assessed     SENSATION: Not tested  POSTURE: Rt arm in sling, forward shoulders   UPPER EXTREMITY ROM:   Passive ROM Right eval Left eval 07/08/24 Right  PROM 07/13/24 Right PROM 07/20/24 R PROM 07/29/24 PROM  Right  08/16/24 Right PROM  Shoulder flexion 30   50 89  88 98 deg s  Shoulder extension         Shoulder abduction         Shoulder adduction         Shoulder internal rotation         Shoulder external rotation 0    12 10 deg 0 pain  15 deg s  Elbow flexion         Elbow extension Full         Wrist flexion         Wrist extension         Wrist ulnar deviation         Wrist radial deviation         Wrist pronation         Wrist supination         (Blank rows = not tested)  UPPER EXTREMITY MMT: deferred on eval due to post-op acuity   MMT Right eval Left eval  Shoulder flexion    Shoulder extension    Shoulder abduction    Shoulder adduction    Shoulder internal rotation    Shoulder external rotation    Middle trapezius    Lower trapezius    Elbow flexion    Elbow extension    Wrist flexion    Wrist extension    Wrist ulnar deviation    Wrist radial deviation    Wrist pronation    Wrist supination    Grip strength (lbs)    (Blank rows = not tested)    PALPATION:  Not assessed   [facility] Adult PT Treatment:                                                DATE: 08/16/24 Therapeutic Exercise: Supine short lever flexion w cane x4 discontinued d/t pain Seated passive shoulder flexion slide x8 Seated passive shoulder scaption x8 HEP update + education  Therapeutic Activity: MSK assessment + education Education/discussion re: progress with PT, symptom behavior as it affects activity tolerance, PT goals/POC   Self Care: Education/discussion re: her recent communication w/ surgical team, self care activities, activity modification,  reinforcing surgical protocol and post op restrictions as well as gradual progression based on her tolerance   [facility] Adult PT Treatment:                                                DATE: 07/29/24 Therapeutic Exercise: ROM measurements Reviewed and updated HEP   Therapeutic Activity: Standing shoulder abduction AAROM with dowel x 10 Standing shoulder flexion AAROM with dowel d/c due to pain Standing shoulder flexion AAROM with hand clasp x 5  Self Care: Reviewed post-op protocol with lengthy discussion regarding ROM restrictions   [facility] Adult PT Treatment:                                                DATE: 07/26/24 Therapeutic Exercise: Reviewed and updated HEP.  Manual Therapy: Rt shoulder ER PROM to tolerance  Therapeutic Activity: Standing shoulder flexion AAROM with SPC 2 x 5  Standing shoulder abduction AAROM with SPC 2 x 5  Pulleys d/c due to pain Standing ER AAROM with SPC d/c due to pain   Self Care: Discussed rehab protocol which includes discontinuing sling at 4 weeks and ongoing movement restrictions; printed copy for patient     PATIENT EDUCATION: Education details: see treatment Person educated: Patient Education method: Explanation, Demonstration, Tactile cues, and Verbal cues, handout  Education comprehension: verbalized understanding, returned demonstration, verbal cues required, tactile cues required, and needs further education  HOME EXERCISE PROGRAM: Access Code: [facility] URL: [email or web address] Date: 08/16/2024 Prepared by: Alm Jenny  Exercises - Putty Squeezes  - 3 x daily - 7 x weekly - 1 sets - 10 reps - Wrist Flexion AROM  - 3 x daily - 7 x weekly - 1 sets - 10 reps - Wrist Extension AROM  - 3 x daily - 7 x weekly - 1 sets - 10 reps - Seated Shoulder Flexion Towel Slide at Table Top  - 2 x daily - 7 x weekly - 1 sets - 5 reps - Seated Shoulder Scaption Slide at Table Top with Forearm in Neutral  - 2 x daily - 7 x weekly - 1  sets - 5 reps  ASSESSMENT:  CLINICAL IMPRESSION: 08/16/2024: Pt arrives just under 7 weeks s/p R lateralized rTSA after gap in care following temporary hold in context of increased shoulder pain. Notes she has been cleared to return to PT without specific restriction outside usual protocol but was advised to be conservative. Continues to have difficulty with usual daily tasks although notes improved reaching below shoulder height and less difficulty with ADLs although limitations persist. Her PROM has improved compared to last visits with minimal muscle guarding, limited more so by stretching sensation. However, her exercise tolerance remains limited even with modification for passive focus with intermittent spasm sensations posteriorly and anteriorly. This improves w/ cues for limiting ROM and mitigating muscle guarding. Also reinforcing post op restrictions and activity modification as above. No adverse events. Recommend continuing along current POC in order to address relevant deficits and improve functional tolerance. Pt departs today's session in no acute distress, all voiced questions/concerns addressed appropriately from PT perspective.    Per eval: Patient is a 74 y.o. female who was seen today for physical therapy evaluation and treatment for s/p Rt lateralized RTSA on 06/29/24. She demonstrates Rt shoulder ROM and strength deficits that are consistent with her recent post-operative status. She will benefit from skilled PT to assist in restoring her RUE strength and ROM in order to optimize her function and assist in overall pain reduction.    OBJECTIVE IMPAIRMENTS: decreased activity tolerance, decreased knowledge of condition, decreased knowledge of use of DME, decreased ROM, decreased strength, increased edema, impaired UE functional use, postural dysfunction, and pain.   ACTIVITY LIMITATIONS: carrying, lifting, transfers, bed mobility, bathing, toileting, dressing, self feeding, reach over  head, and hygiene/grooming  PARTICIPATION LIMITATIONS: meal prep, cleaning, laundry, driving, shopping, community activity, and yard work  PERSONAL FACTORS: Age, Fitness, Time since onset of injury/illness/exacerbation, and 3+ comorbidities: see PMH above are also affecting patient's functional outcome.   REHAB POTENTIAL: Good  CLINICAL DECISION MAKING: Stable/uncomplicated  EVALUATION COMPLEXITY: Low  GOALS: Goals reviewed with patient? Yes  SHORT TERM GOALS: Target date: 08/12/2024    Patient will be independent and compliant with initial HEP.   Baseline: initial HEP issued  08/16/24: reports intermittent HEP adherence (PT  on hold until today)  Goal status: ONGOING  2.  Patient will demonstrate at least 120 degrees of Rt shoulder flexion PROM to facilitate progression towards AROM activity.  Baseline: see above 08/16/24: see ROM chart above Goal status: ONGOING  3.  Patient will demonstrate at least 30 degrees of Rt shoulder ER PROM to facilitate progression towards AROM activity.  Baseline: see above  08/16/24: see ROM chart above Goal status: PROGRESSING   LONG TERM GOALS: Target date: 09/23/2024    Patient will demonstrate at least 140 degrees of shoulder flexion AROM without shrug to improve ability to reach into cabinets.  Baseline: AROM deferred  08/16/24: see ROM chart above Goal status: PROGRESSING  2.  Patient will be able to complete sewing activity with minimal/no Rt shoulder pain.  Baseline: unable  08/16/24: not currently sewing, as expected Goal status: ONGOING  3.  Patient will demonstrate at least 45 degrees of Rt shoulder ER AROM to improve ability to complete self-care activities.  Baseline: deferred 08/16/24: see ROM chart above Goal status: PROGRESSING  4.  Patient will demonstrate at least 90 degrees of Rt shoulder abduction AROM to improve ability to complete reaching activities.  Baseline: deferred 08/16/24: see ROM chart above Goal status:  PROGRESSING  5.  Patient will score </= 20 % disability on the QuickDASH (MCID is [id number]) to signify clinically meaningful improvement in functional abilities.   Baseline: see above 08/16/24: 52.3% Goal status: PROGRESSING   PLAN: PT FREQUENCY: 2x/week  PT DURATION: 12 weeks  PLANNED INTERVENTIONS: 97164- PT Re-evaluation, 97750- Physical Performance Testing, 97110-Therapeutic exercises, 97530- Therapeutic activity, [id number]- Neuromuscular re-education, 97535- Self Care, [id number]- Manual therapy, 97016- Vasopneumatic device, 20560 (1-2 muscles), 20561 (3+ muscles)- Dry Needling, Cryotherapy, and Moist heat  PLAN FOR NEXT SESSION:  AA/PROM within pt tolerance and surgical protocol. Consider introduction of isometrics in coming visits. Symptom modification strategies as indicated/appropriate  Alm DELENA Jenny PT, DPT 08/16/2024 12:36 PM

## 2024-08-16 ENCOUNTER — Encounter: Payer: Self-pay | Admitting: Physical Therapy

## 2024-08-16 ENCOUNTER — Ambulatory Visit: Admitting: Physical Therapy

## 2024-08-16 DIAGNOSIS — M25611 Stiffness of right shoulder, not elsewhere classified: Secondary | ICD-10-CM | POA: Diagnosis not present

## 2024-08-16 DIAGNOSIS — M6281 Muscle weakness (generalized): Secondary | ICD-10-CM

## 2024-08-16 DIAGNOSIS — M25511 Pain in right shoulder: Secondary | ICD-10-CM

## 2024-08-16 DIAGNOSIS — R600 Localized edema: Secondary | ICD-10-CM | POA: Diagnosis not present

## 2024-08-18 ENCOUNTER — Ambulatory Visit

## 2024-08-18 DIAGNOSIS — M25611 Stiffness of right shoulder, not elsewhere classified: Secondary | ICD-10-CM

## 2024-08-18 DIAGNOSIS — D225 Melanocytic nevi of trunk: Secondary | ICD-10-CM | POA: Diagnosis not present

## 2024-08-18 DIAGNOSIS — L821 Other seborrheic keratosis: Secondary | ICD-10-CM | POA: Diagnosis not present

## 2024-08-18 DIAGNOSIS — L578 Other skin changes due to chronic exposure to nonionizing radiation: Secondary | ICD-10-CM | POA: Diagnosis not present

## 2024-08-18 DIAGNOSIS — R600 Localized edema: Secondary | ICD-10-CM

## 2024-08-18 DIAGNOSIS — M25511 Pain in right shoulder: Secondary | ICD-10-CM

## 2024-08-18 DIAGNOSIS — M6281 Muscle weakness (generalized): Secondary | ICD-10-CM | POA: Diagnosis not present

## 2024-08-18 DIAGNOSIS — L814 Other melanin hyperpigmentation: Secondary | ICD-10-CM | POA: Diagnosis not present

## 2024-08-18 NOTE — Therapy (Signed)
 OUTPATIENT PHYSICAL THERAPY TREATMENT   Patient Name: Melissa Rodgers MRN: [id number] DOB:[birth year], 73 y.o., female Today's Date: 08/18/2024       END OF SESSION:  PT End of Session - 08/18/24 1059     Visit Number 10    Number of Visits 24    Date for Recertification  09/23/24    Authorization Type MCR    Progress Note Due on Visit 19    PT Start Time 1100    PT Stop Time 1145    PT Time Calculation (min) 45 min    Activity Tolerance Patient limited by pain    Behavior During Therapy [facility] for tasks assessed/performed                   Past Medical History:  Diagnosis Date   Allergy    seasonal   Cataract 2024   Chronic bilateral low back pain without sciatica 10/07/2016   DDD (degenerative disc disease), cervical 12/03/2020   Diverticulitis of sigmoid colon 08/01/2019   Dysrhythmia    Afib   Essential hypertension 09/09/2018   Family history of diabetes mellitus 06/09/2022   Fibromyalgia    Gastroesophageal reflux disease without esophagitis 11/06/2015   Generalized anxiety disorder with panic attacks 08/01/2019   Hyperlipidemia 09/09/2018   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   Lumbosacral spondylosis 10/07/2016   Mass of lower inner quadrant of right breast 07/11/2022   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 Onetha Larger, [facility]. polyp 2 mm in the sigmoid colon, ( Polypectomy). Moderate diverticulosis of the descending colon and sigmoid colon. Lipoma in the ascending colon. Internal hemorrhoids.   ESOPHAGOGASTRODUODENOSCOPY     possibly last one was 2017. In [place]   HERNIA REPAIR     JOINT REPLACEMENT     MEDIAL  PARTIAL KNEE REPLACEMENT Right 12/14/2014   NISSEN FUNDOPLICATION  02/21/2016   OOPHORECTOMY     PARTIAL KNEE ARTHROPLASTY Left 04/05/2014   TOTAL KNEE REVISION Left 05/10/2021   Procedure: TOTAL KNEE REVISION;  Surgeon: Yvone Rush, MD;  Location: [facility];  Service: Orthopedics;  Laterality: Left;   TUBAL LIGATION     Patient Active Problem List   Diagnosis Date Noted   Mild coronary artery disease 05/04/2024   Preoperative cardiovascular examination 05/04/2024   Polyarthralgia with elevated ESR 03/15/2024   Rotator cuff tear, right 01/29/2024   Allergy    Mass of lower inner quadrant of right breast 07/11/2022   Family history of diabetes mellitus 06/09/2022   Paroxysmal atrial fibrillation (HCC) 07/15/2021   DDD (degenerative disc disease), cervical 12/03/2020   Fibromyalgia 03/05/2020   Diverticulitis of sigmoid colon 08/01/2019   Generalized anxiety disorder with panic attacks 08/01/2019   Non-seasonal allergic rhinitis 09/09/2018   Essential hypertension 09/09/2018   Mixed dyslipidemia 09/09/2018   Lumbosacral spondylosis 10/07/2016   Primary osteoarthritis of both knees post bilateral hemiarthroplasty 10/07/2016   Chronic bilateral low back pain without sciatica 10/07/2016   Atrial fibrillation 04/17/2016   S/P laparoscopic fundoplication 03/17/2016   Gastroesophageal reflux disease without esophagitis 11/06/2015   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    PCP: Willo Mini, NP  REFERRING PROVIDER: Cristy Bonner DASEN, MD  REFERRING DIAG: Right lateralized reverse total shoulder arthroplasty  THERAPY DIAG:  Acute pain of right shoulder  Stiffness of right shoulder, not elsewhere classified  Muscle weakness (generalized)  Localized edema  Rationale for Evaluation and Treatment: Rehabilitation  ONSET DATE: 06/29/24  SUBJECTIVE:                                                                                                                                                                                      Per eval: Patient underwent Rt lateralized RTSA surgery on 06/29/24. Right now the pain is pretty good since taking tylenol  and ibuprofen this morning. Pain has ebbed and flowed over the past 2 days. Does have some tingling in the thumb currently. All of her hobbies involve her arms including crafting and gardening. Wants to be able to use her sewing machine and is having difficulty with basic daily tasks right now.  Hand dominance: Left  SUBJECTIVE STATEMENT: Patient reports if she keeps the arm still it does not hurt. If she reaches with the arm she will get pain about the shoulder rated as 2/10.    PERTINENT HISTORY: Fibromyalgia  A-fib Chronic low back pain   PAIN:  Are you having pain? Yes: NPRS scale: none currently; at worst 4 Pain location: Rt shoulder Pain description: sore Aggravating factors: movement Relieving factors: medication  PRECAUTIONS: Other: see protocol for specific ROM and lifting restrictions  RED FLAGS: None   WEIGHT BEARING RESTRICTIONS: Yes RUE < 1lb   FALLS:  Has patient fallen in last 6 months? No  LIVING ENVIRONMENT: Lives with: lives with their spouse Lives in: House/apartment Stairs: Yes: External: 8 steps; can reach both Has following equipment at home: Quad cane small base  OCCUPATION: Retired   PLOF: Independent  PATIENT GOALS: I want to use my arm again.   NEXT MD VISIT: Oct 31  OBJECTIVE:  Note: Objective measures were completed at Evaluation unless otherwise noted.  DIAGNOSTIC FINDINGS:  None on file   PATIENT SURVEYS :  Quick DASH: 81.8% disability   08/16/24 QuickDASH 52.3%   COGNITION: Overall cognitive status: Within functional limits for tasks assessed     SENSATION: Not tested  POSTURE: Rt arm in sling, forward shoulders   UPPER EXTREMITY ROM:   Passive ROM Right eval Left eval 07/08/24 Right  PROM 07/13/24 Right PROM 07/20/24 R PROM 07/29/24 PROM Right  08/16/24 Right PROM   Shoulder flexion 30   50 89  88 98 deg s  Shoulder extension         Shoulder abduction         Shoulder adduction         Shoulder internal rotation  Shoulder external rotation 0    12 10 deg 0 pain  15 deg s  Elbow flexion         Elbow extension Full         Wrist flexion         Wrist extension         Wrist ulnar deviation         Wrist radial deviation         Wrist pronation         Wrist supination         (Blank rows = not tested)  UPPER EXTREMITY MMT: deferred on eval due to post-op acuity   MMT Right eval Left eval  Shoulder flexion    Shoulder extension    Shoulder abduction    Shoulder adduction    Shoulder internal rotation    Shoulder external rotation    Middle trapezius    Lower trapezius    Elbow flexion    Elbow extension    Wrist flexion    Wrist extension    Wrist ulnar deviation    Wrist radial deviation    Wrist pronation    Wrist supination    Grip strength (lbs)    (Blank rows = not tested)    PALPATION:  Not assessed  [facility] Adult PT Treatment:                                                DATE: 08/18/24 Therapeutic Exercise: Shoulder abduction isometric x 10; 5 sec hold Shoulder ER isometric x 2 d/c due to pain  Reviewed and updated HEP  Manual Therapy: Rt shoulder PROM flexion, ER to tolerance  Neuromuscular re-ed: Scapular retraction  2 x 10   Therapeutic Activity: Supine shoulder flexion AAROM with hand clasp x 2; d/c due to Lt wrist pain Supine shoulder flexion AAROM with cane x 5   Self Care: Continue use of ice Be mindful of guarding/quick movements with the shoulder trying to limit these   [facility] Adult PT Treatment:                                                DATE: 08/16/24 Therapeutic Exercise: Supine short lever flexion w cane x4 discontinued d/t pain Seated passive shoulder flexion slide x8 Seated passive shoulder scaption x8 HEP update + education  Therapeutic Activity: MSK assessment +  education Education/discussion re: progress with PT, symptom behavior as it affects activity tolerance, PT goals/POC   Self Care: Education/discussion re: her recent communication w/ surgical team, self care activities, activity modification, reinforcing surgical protocol and post op restrictions as well as gradual progression based on her tolerance   [facility] Adult PT Treatment:                                                DATE: 07/29/24 Therapeutic Exercise: ROM measurements Reviewed and updated HEP   Therapeutic Activity: Standing shoulder abduction AAROM with dowel x 10 Standing shoulder flexion AAROM with dowel d/c due to pain Standing shoulder flexion AAROM with hand clasp x  5  Self Care: Reviewed post-op protocol with lengthy discussion regarding ROM restrictions   [facility] Adult PT Treatment:                                                DATE: 07/26/24 Therapeutic Exercise: Reviewed and updated HEP.  Manual Therapy: Rt shoulder ER PROM to tolerance   Therapeutic Activity: Standing shoulder flexion AAROM with SPC 2 x 5  Standing shoulder abduction AAROM with SPC 2 x 5  Pulleys d/c due to pain Standing ER AAROM with SPC d/c due to pain   Self Care: Discussed rehab protocol which includes discontinuing sling at 4 weeks and ongoing movement restrictions; printed copy for patient     PATIENT EDUCATION: Education details: see treatment Person educated: Patient Education method: Explanation, Demonstration, Tactile cues, and Verbal cues,  Education comprehension: verbalized understanding, returned demonstration, verbal cues required, tactile cues required, and needs further education  HOME EXERCISE PROGRAM: Access Code: [facility] URL: [email or web address] Date: 08/18/2024 Prepared by: Lucie Meeter  Exercises - Seated Shoulder Flexion Towel Slide at Table Top  - 2 x daily - 7 x weekly - 1 sets - 5 reps - Seated Shoulder Scaption Slide at Table Top with Forearm  in Neutral  - 2 x daily - 7 x weekly - 1 sets - 5 reps - Supine Shoulder Flexion AAROM with Dowel  - 2 x daily - 7 x weekly - 1 sets - 5 reps - Seated Scapular Retraction  - 2 x daily - 7 x weekly - 2 sets - 10 reps - Isometric Shoulder Abduction at Wall  - 2 x daily - 7 x weekly - 1 sets - 10 reps - 5 sec  hold  ASSESSMENT:  CLINICAL IMPRESSION: Patient with fair tolerance to today's session. She remains somewhat guarded with the shoulder throughout session and when she experienced moments of pain she would quickly jerk her shoulder into a protected shoulder shrug, internal rotation positioning. We had lengthy discussion regarding trying to reduce her tendency to guard and react with quick shoulder movements as this is likely exacerbating her pain with patient verbalizing understanding. She does not tolerate ER activity well passively, active-assisted, or isometrically secondary to pain, so these activities were discontinued. She tolerates passive and active assisted shoulder flexion activity well. Cues required with scapular retraction to reduce upper trap engagement. Introduced abduction isometrics today without an increase in pain, but fatigues with this activity.   Per eval: Patient is a 73 y.o. female who was seen today for physical therapy evaluation and treatment for s/p Rt lateralized RTSA on 06/29/24. She demonstrates Rt shoulder ROM and strength deficits that are consistent with her recent post-operative status. She will benefit from skilled PT to assist in restoring her RUE strength and ROM in order to optimize her function and assist in overall pain reduction.    OBJECTIVE IMPAIRMENTS: decreased activity tolerance, decreased knowledge of condition, decreased knowledge of use of DME, decreased ROM, decreased strength, increased edema, impaired UE functional use, postural dysfunction, and pain.   ACTIVITY LIMITATIONS: carrying, lifting, transfers, bed mobility, bathing, toileting, dressing,  self feeding, reach over head, and hygiene/grooming  PARTICIPATION LIMITATIONS: meal prep, cleaning, laundry, driving, shopping, community activity, and yard work  PERSONAL FACTORS: Age, Fitness, Time since onset of injury/illness/exacerbation, and 3+ comorbidities: see PMH above are also affecting patient's functional outcome.  REHAB POTENTIAL: Good  CLINICAL DECISION MAKING: Stable/uncomplicated  EVALUATION COMPLEXITY: Low  GOALS: Goals reviewed with patient? Yes  SHORT TERM GOALS: Target date: 08/12/2024    Patient will be independent and compliant with initial HEP.   Baseline: initial HEP issued  08/16/24: reports intermittent HEP adherence (PT on hold until today)  Goal status: ONGOING  2.  Patient will demonstrate at least 120 degrees of Rt shoulder flexion PROM to facilitate progression towards AROM activity.  Baseline: see above 08/16/24: see ROM chart above Goal status: ONGOING  3.  Patient will demonstrate at least 30 degrees of Rt shoulder ER PROM to facilitate progression towards AROM activity.  Baseline: see above  08/16/24: see ROM chart above Goal status: PROGRESSING   LONG TERM GOALS: Target date: 09/23/2024    Patient will demonstrate at least 140 degrees of shoulder flexion AROM without shrug to improve ability to reach into cabinets.  Baseline: AROM deferred  08/16/24: see ROM chart above Goal status: PROGRESSING  2.  Patient will be able to complete sewing activity with minimal/no Rt shoulder pain.  Baseline: unable  08/16/24: not currently sewing, as expected Goal status: ONGOING  3.  Patient will demonstrate at least 45 degrees of Rt shoulder ER AROM to improve ability to complete self-care activities.  Baseline: deferred 08/16/24: see ROM chart above Goal status: PROGRESSING  4.  Patient will demonstrate at least 90 degrees of Rt shoulder abduction AROM to improve ability to complete reaching activities.  Baseline: deferred 08/16/24: see ROM  chart above Goal status: PROGRESSING  5.  Patient will score </= 20 % disability on the QuickDASH (MCID is [id number]) to signify clinically meaningful improvement in functional abilities.   Baseline: see above 08/16/24: 52.3% Goal status: PROGRESSING   PLAN: PT FREQUENCY: 2x/week  PT DURATION: 12 weeks  PLANNED INTERVENTIONS: 97164- PT Re-evaluation, 97750- Physical Performance Testing, 97110-Therapeutic exercises, 97530- Therapeutic activity, [id number]- Neuromuscular re-education, 97535- Self Care, [id number]- Manual therapy, 97016- Vasopneumatic device, 20560 (1-2 muscles), 20561 (3+ muscles)- Dry Needling, Cryotherapy, and Moist heat  PLAN FOR NEXT SESSION:  AA/PROM within pt tolerance and surgical protocol. Continue with isometrics as tolerated. Symptom modification strategies as indicated/appropriate. Pulleys?  Ayren Zumbro, PT, DPT, ATC 08/18/24 11:48 AM

## 2024-08-23 ENCOUNTER — Ambulatory Visit

## 2024-08-23 DIAGNOSIS — M25511 Pain in right shoulder: Secondary | ICD-10-CM

## 2024-08-23 DIAGNOSIS — M25611 Stiffness of right shoulder, not elsewhere classified: Secondary | ICD-10-CM

## 2024-08-23 DIAGNOSIS — R600 Localized edema: Secondary | ICD-10-CM | POA: Diagnosis not present

## 2024-08-23 DIAGNOSIS — M6281 Muscle weakness (generalized): Secondary | ICD-10-CM | POA: Diagnosis not present

## 2024-08-23 NOTE — Therapy (Signed)
 OUTPATIENT PHYSICAL THERAPY TREATMENT   Patient Name: Melissa Rodgers MRN: [id number] DOB:[birth year], 73 y.o., female Today's Date: 08/23/2024       END OF SESSION:  PT End of Session - 08/23/24 1056     Visit Number 11    Number of Visits 24    Date for Recertification  09/23/24    Authorization Type MCR    Progress Note Due on Visit 19    PT Start Time 1100    PT Stop Time 1141    PT Time Calculation (min) 41 min    Activity Tolerance Patient tolerated treatment well    Behavior During Therapy [facility] for tasks assessed/performed                    Past Medical History:  Diagnosis Date   Allergy    seasonal   Cataract 2024   Chronic bilateral low back pain without sciatica 10/07/2016   DDD (degenerative disc disease), cervical 12/03/2020   Diverticulitis of sigmoid colon 08/01/2019   Dysrhythmia    Afib   Essential hypertension 09/09/2018   Family history of diabetes mellitus 06/09/2022   Fibromyalgia    Gastroesophageal reflux disease without esophagitis 11/06/2015   Generalized anxiety disorder with panic attacks 08/01/2019   Hyperlipidemia 09/09/2018   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   Lumbosacral spondylosis 10/07/2016   Mass of lower inner quadrant of right breast 07/11/2022   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 Onetha Larger, [facility]. polyp 2 mm in the sigmoid colon, ( Polypectomy). Moderate diverticulosis of the descending colon and sigmoid colon. Lipoma in the ascending colon. Internal hemorrhoids.   ESOPHAGOGASTRODUODENOSCOPY     possibly last one was 2017. In [place]   HERNIA REPAIR     JOINT REPLACEMENT      MEDIAL PARTIAL KNEE REPLACEMENT Right 12/14/2014   NISSEN FUNDOPLICATION  02/21/2016   OOPHORECTOMY     PARTIAL KNEE ARTHROPLASTY Left 04/05/2014   TOTAL KNEE REVISION Left 05/10/2021   Procedure: TOTAL KNEE REVISION;  Surgeon: Yvone Rush, MD;  Location: [facility];  Service: Orthopedics;  Laterality: Left;   TUBAL LIGATION     Patient Active Problem List   Diagnosis Date Noted   Mild coronary artery disease 05/04/2024   Preoperative cardiovascular examination 05/04/2024   Polyarthralgia with elevated ESR 03/15/2024   Rotator cuff tear, right 01/29/2024   Allergy    Mass of lower inner quadrant of right breast 07/11/2022   Family history of diabetes mellitus 06/09/2022   Paroxysmal atrial fibrillation (HCC) 07/15/2021   DDD (degenerative disc disease), cervical 12/03/2020   Fibromyalgia 03/05/2020   Diverticulitis of sigmoid colon 08/01/2019   Generalized anxiety disorder with panic attacks 08/01/2019   Non-seasonal allergic rhinitis 09/09/2018   Essential hypertension 09/09/2018   Mixed dyslipidemia 09/09/2018   Lumbosacral spondylosis 10/07/2016   Primary osteoarthritis of both knees post bilateral hemiarthroplasty 10/07/2016   Chronic bilateral low back pain without sciatica 10/07/2016   Atrial fibrillation 04/17/2016   S/P laparoscopic fundoplication 03/17/2016   Gastroesophageal reflux disease without esophagitis 11/06/2015   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    PCP: Willo Mini, NP  REFERRING PROVIDER: Cristy Bonner DASEN, MD  REFERRING DIAG: Right lateralized reverse total shoulder  arthroplasty   THERAPY DIAG:  Acute pain of right shoulder  Stiffness of right shoulder, not elsewhere classified  Muscle weakness (generalized)  Localized edema  Rationale for Evaluation and Treatment: Rehabilitation  ONSET DATE: 06/29/24  SUBJECTIVE:                                                                                                                                                                                      Per eval: Patient underwent Rt lateralized RTSA surgery on 06/29/24. Right now the pain is pretty good since taking tylenol  and ibuprofen this morning. Pain has ebbed and flowed over the past 2 days. Does have some tingling in the thumb currently. All of her hobbies involve her arms including crafting and gardening. Wants to be able to use her sewing machine and is having difficulty with basic daily tasks right now.  Hand dominance: Left  SUBJECTIVE STATEMENT: Patient reports no pain at rest, but when she moves it hurts. Does not tolerate laying down due to pain.    PERTINENT HISTORY: Fibromyalgia  A-fib Chronic low back pain   PAIN:  Are you having pain? Yes: NPRS scale: none currently; at worst 6 Pain location: Rt shoulder Pain description: sore Aggravating factors: movement Relieving factors: medication  PRECAUTIONS: Other: see protocol for specific ROM and lifting restrictions  RED FLAGS: None   WEIGHT BEARING RESTRICTIONS: Yes RUE < 1lb   FALLS:  Has patient fallen in last 6 months? No  LIVING ENVIRONMENT: Lives with: lives with their spouse Lives in: House/apartment Stairs: Yes: External: 8 steps; can reach both Has following equipment at home: Quad cane small base  OCCUPATION: Retired   PLOF: Independent  PATIENT GOALS: I want to use my arm again.   NEXT MD VISIT: Oct 31  OBJECTIVE:  Note: Objective measures were completed at Evaluation unless otherwise noted.  DIAGNOSTIC FINDINGS:  None on file   PATIENT SURVEYS :  Quick DASH: 81.8% disability   08/16/24 QuickDASH 52.3%   COGNITION: Overall cognitive status: Within functional limits for tasks assessed     SENSATION: Not tested  POSTURE: Rt arm in sling, forward shoulders   UPPER EXTREMITY ROM:   Passive ROM Right eval Left eval 07/08/24 Right  PROM 07/13/24 Right PROM 07/20/24 R PROM 07/29/24 PROM Right  08/16/24 Right PROM  Shoulder flexion 30   50 89  88  98 deg s  Shoulder extension         Shoulder abduction         Shoulder adduction         Shoulder internal rotation         Shoulder external rotation  0    12 10 deg 0 pain  15 deg s  Elbow flexion         Elbow extension Full         Wrist flexion         Wrist extension         Wrist ulnar deviation         Wrist radial deviation         Wrist pronation         Wrist supination         (Blank rows = not tested)  UPPER EXTREMITY MMT: deferred on eval due to post-op acuity   MMT Right eval Left eval  Shoulder flexion    Shoulder extension    Shoulder abduction    Shoulder adduction    Shoulder internal rotation    Shoulder external rotation    Middle trapezius    Lower trapezius    Elbow flexion    Elbow extension    Wrist flexion    Wrist extension    Wrist ulnar deviation    Wrist radial deviation    Wrist pronation    Wrist supination    Grip strength (lbs)    (Blank rows = not tested)    PALPATION:  Not assessed  [facility] Adult PT Treatment:                                                DATE: 08/23/24 Therapeutic Exercise: Seated ER AROM x 10   Neuromuscular re-ed: ER isometric PT resist x 5 Resisted extension to neutral yellow band 2 x 8  Therapeutic Activity: Standing towel slide flexion, unable due to weakness Standing flexion AAROM with dowel x 10  Standing abduction AAROM with dowel x 10  Seated physioball rollout shoulder flexion x 10  Pulleys flexion and scaption x 1 minute  Standing shoulder AROM partial range 2 x 4     [facility] Adult PT Treatment:                                                DATE: 08/18/24 Therapeutic Exercise: Shoulder abduction isometric x 10; 5 sec hold Shoulder ER isometric x 2 d/c due to pain  Reviewed and updated HEP  Manual Therapy: Rt shoulder PROM flexion, ER to tolerance  Neuromuscular re-ed: Scapular retraction  2 x 10   Therapeutic Activity: Supine shoulder flexion AAROM with hand clasp x 2; d/c due to Lt  wrist pain Supine shoulder flexion AAROM with cane x 5   Self Care: Continue use of ice Be mindful of guarding/quick movements with the shoulder trying to limit these   [facility] Adult PT Treatment:                                                DATE: 08/16/24 Therapeutic Exercise: Supine short lever flexion w cane x4 discontinued d/t pain Seated passive shoulder flexion slide x8 Seated passive shoulder scaption x8 HEP update + education  Therapeutic Activity: MSK assessment + education Education/discussion re: progress with PT, symptom behavior as it  affects activity tolerance, PT goals/POC   Self Care: Education/discussion re: her recent communication w/ surgical team, self care activities, activity modification, reinforcing surgical protocol and post op restrictions as well as gradual progression based on her tolerance   [facility] Adult PT Treatment:                                                DATE: 07/29/24 Therapeutic Exercise: ROM measurements Reviewed and updated HEP   Therapeutic Activity: Standing shoulder abduction AAROM with dowel x 10 Standing shoulder flexion AAROM with dowel d/c due to pain Standing shoulder flexion AAROM with hand clasp x 5  Self Care: Reviewed post-op protocol with lengthy discussion regarding ROM restrictions   [facility] Adult PT Treatment:                                                DATE: 07/26/24 Therapeutic Exercise: Reviewed and updated HEP.  Manual Therapy: Rt shoulder ER PROM to tolerance   Therapeutic Activity: Standing shoulder flexion AAROM with SPC 2 x 5  Standing shoulder abduction AAROM with SPC 2 x 5  Pulleys d/c due to pain Standing ER AAROM with SPC d/c due to pain   Self Care: Discussed rehab protocol which includes discontinuing sling at 4 weeks and ongoing movement restrictions; printed copy for patient     PATIENT EDUCATION: Education details: HEP update Person educated: Patient Education method: Explanation, Demonstration,  Tactile cues, and Verbal cues,  Education comprehension: verbalized understanding, returned demonstration, verbal cues required, tactile cues required, and needs further education  HOME EXERCISE PROGRAM: Access Code: [facility] URL: [email or web address] Date: 08/23/2024 Prepared by: Lucie Meeter  Exercises - Seated Shoulder Flexion Towel Slide at Table Top  - 2 x daily - 7 x weekly - 1 sets - 5 reps - Seated Shoulder Scaption Slide at Table Top with Forearm in Neutral  - 2 x daily - 7 x weekly - 1 sets - 5 reps - Seated Scapular Retraction  - 2 x daily - 7 x weekly - 2 sets - 10 reps - Isometric Shoulder Abduction at Wall  - 2 x daily - 7 x weekly - 1 sets - 10 reps - 5 sec  hold - Shoulder Flexion Overhead with Dowel  - 2 x daily - 7 x weekly - 1 sets - 10 reps - Standing Shoulder Abduction AAROM with Dowel  - 2 x daily - 7 x weekly - 1 sets - 10 reps - Seated Shoulder External Rotation  - 2 x daily - 7 x weekly - 1 sets - 10 reps  ASSESSMENT:  CLINICAL IMPRESSION: Continued with AAROM mostly in flexion and abduction in sitting and standing as patient reports pain limits her ability to lay in supine. Tolerates AAROM through approximately 90 degrees of flexion. Introduced flexion AROM through approximately 40 degrees before shoulder shrug present. Introduced light resisted scapular strengthening with good tolerance. Fatigued quickly with ER AROM.   Per eval: Patient is a 73 y.o. female who was seen today for physical therapy evaluation and treatment for s/p Rt lateralized RTSA on 06/29/24. She demonstrates Rt shoulder ROM and strength deficits that are consistent with her recent post-operative status. She will benefit from skilled PT to  assist in restoring her RUE strength and ROM in order to optimize her function and assist in overall pain reduction.    OBJECTIVE IMPAIRMENTS: decreased activity tolerance, decreased knowledge of condition, decreased knowledge of use of DME,  decreased ROM, decreased strength, increased edema, impaired UE functional use, postural dysfunction, and pain.   ACTIVITY LIMITATIONS: carrying, lifting, transfers, bed mobility, bathing, toileting, dressing, self feeding, reach over head, and hygiene/grooming  PARTICIPATION LIMITATIONS: meal prep, cleaning, laundry, driving, shopping, community activity, and yard work  PERSONAL FACTORS: Age, Fitness, Time since onset of injury/illness/exacerbation, and 3+ comorbidities: see PMH above are also affecting patient's functional outcome.   REHAB POTENTIAL: Good  CLINICAL DECISION MAKING: Stable/uncomplicated  EVALUATION COMPLEXITY: Low  GOALS: Goals reviewed with patient? Yes  SHORT TERM GOALS: Target date: 08/12/2024    Patient will be independent and compliant with initial HEP.   Baseline: initial HEP issued  08/16/24: reports intermittent HEP adherence (PT on hold until today)  Goal status: ONGOING  2.  Patient will demonstrate at least 120 degrees of Rt shoulder flexion PROM to facilitate progression towards AROM activity.  Baseline: see above 08/16/24: see ROM chart above Goal status: ONGOING  3.  Patient will demonstrate at least 30 degrees of Rt shoulder ER PROM to facilitate progression towards AROM activity.  Baseline: see above  08/16/24: see ROM chart above Goal status: PROGRESSING   LONG TERM GOALS: Target date: 09/23/2024    Patient will demonstrate at least 140 degrees of shoulder flexion AROM without shrug to improve ability to reach into cabinets.  Baseline: AROM deferred  08/16/24: see ROM chart above Goal status: PROGRESSING  2.  Patient will be able to complete sewing activity with minimal/no Rt shoulder pain.  Baseline: unable  08/16/24: not currently sewing, as expected Goal status: ONGOING  3.  Patient will demonstrate at least 45 degrees of Rt shoulder ER AROM to improve ability to complete self-care activities.  Baseline: deferred 08/16/24: see ROM  chart above Goal status: PROGRESSING  4.  Patient will demonstrate at least 90 degrees of Rt shoulder abduction AROM to improve ability to complete reaching activities.  Baseline: deferred 08/16/24: see ROM chart above Goal status: PROGRESSING  5.  Patient will score </= 20 % disability on the QuickDASH (MCID is [id number]) to signify clinically meaningful improvement in functional abilities.   Baseline: see above 08/16/24: 52.3% Goal status: PROGRESSING   PLAN: PT FREQUENCY: 2x/week  PT DURATION: 12 weeks  PLANNED INTERVENTIONS: 97164- PT Re-evaluation, 97750- Physical Performance Testing, 97110-Therapeutic exercises, 97530- Therapeutic activity, [id number]- Neuromuscular re-education, 97535- Self Care, [id number]- Manual therapy, 97016- Vasopneumatic device, 20560 (1-2 muscles), 20561 (3+ muscles)- Dry Needling, Cryotherapy, and Moist heat  PLAN FOR NEXT SESSION:  AA/PROM within pt tolerance and surgical protocol. Continue with isometrics as tolerated. Symptom modification strategies as indicated/appropriate.   Iya Hamed, PT, DPT, ATC 08/23/24 11:43 AM

## 2024-08-24 NOTE — Therapy (Signed)
 OUTPATIENT PHYSICAL THERAPY TREATMENT   Patient Name: Melissa Rodgers MRN: [id number] DOB:1951-07-26, 73 y.o., female Today's Date: 08/25/2024       END OF SESSION:  PT End of Session - 08/25/24 1101     Visit Number 12    Number of Visits 24    Date for Recertification  09/23/24    Authorization Type MCR    Progress Note Due on Visit 19    PT Start Time 1101    PT Stop Time 1141    PT Time Calculation (min) 40 min                     Past Medical History:  Diagnosis Date   Allergy    seasonal   Cataract 2024   Chronic bilateral low back pain without sciatica 10/07/2016   DDD (degenerative disc disease), cervical 12/03/2020   Diverticulitis of sigmoid colon 08/01/2019   Dysrhythmia    Afib   Essential hypertension 09/09/2018   Family history of diabetes mellitus 06/09/2022   Fibromyalgia    Gastroesophageal reflux disease without esophagitis 11/06/2015   Generalized anxiety disorder with panic attacks 08/01/2019   Hyperlipidemia 09/09/2018   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   Lumbosacral spondylosis 10/07/2016   Mass of lower inner quadrant of right breast 07/11/2022   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 Onetha Larger, [facility]. polyp 2 mm in the sigmoid colon, ( Polypectomy). Moderate diverticulosis of the descending colon and sigmoid colon. Lipoma in the ascending colon. Internal hemorrhoids.   ESOPHAGOGASTRODUODENOSCOPY     possibly last one was 2017. In [place]   HERNIA REPAIR     JOINT REPLACEMENT     MEDIAL PARTIAL KNEE REPLACEMENT Right 12/14/2014   NISSEN FUNDOPLICATION  02/21/2016   OOPHORECTOMY      PARTIAL KNEE ARTHROPLASTY Left 04/05/2014   TOTAL KNEE REVISION Left 05/10/2021   Procedure: TOTAL KNEE REVISION;  Surgeon: Yvone Rush, MD;  Location: [facility];  Service: Orthopedics;  Laterality: Left;   TUBAL LIGATION     Patient Active Problem List   Diagnosis Date Noted   Mild coronary artery disease 05/04/2024   Preoperative cardiovascular examination 05/04/2024   Polyarthralgia with elevated ESR 03/15/2024   Rotator cuff tear, right 01/29/2024   Allergy    Mass of lower inner quadrant of right breast 07/11/2022   Family history of diabetes mellitus 06/09/2022   Paroxysmal atrial fibrillation (HCC) 07/15/2021   DDD (degenerative disc disease), cervical 12/03/2020   Fibromyalgia 03/05/2020   Diverticulitis of sigmoid colon 08/01/2019   Generalized anxiety disorder with panic attacks 08/01/2019   Non-seasonal allergic rhinitis 09/09/2018   Essential hypertension 09/09/2018   Mixed dyslipidemia 09/09/2018   Lumbosacral spondylosis 10/07/2016   Primary osteoarthritis of both knees post bilateral hemiarthroplasty 10/07/2016   Chronic bilateral low back pain without sciatica 10/07/2016   Atrial fibrillation 04/17/2016   S/P laparoscopic fundoplication 03/17/2016   Gastroesophageal reflux disease without esophagitis 11/06/2015   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    PCP: Willo Mini, NP  REFERRING PROVIDER: Cristy Bonner DASEN, MD  REFERRING DIAG: Right lateralized reverse total shoulder arthroplasty   THERAPY DIAG:  Acute pain of right shoulder  Stiffness of right shoulder, not elsewhere  classified  Muscle weakness (generalized)  Localized edema  Rationale for Evaluation and Treatment: Rehabilitation  ONSET DATE: 06/29/24  SUBJECTIVE:                                                                                                                                                                                     Per eval: Patient underwent Rt lateralized RTSA surgery on 06/29/24. Right  now the pain is pretty good since taking tylenol  and ibuprofen this morning. Pain has ebbed and flowed over the past 2 days. Does have some tingling in the thumb currently. All of her hobbies involve her arms including crafting and gardening. Wants to be able to use her sewing machine and is having difficulty with basic daily tasks right now.  Hand dominance: Left  SUBJECTIVE STATEMENT: 08/25/2024: mostly good. Feels like each day is getting a little better. Feels like function is better than before surgery but still having limitations. Felt some muscle fatigue/soreness (lateral deltoid) after last session but no increase in pain. Still not having acromial pain anymore.    PERTINENT HISTORY: Fibromyalgia  A-fib Chronic low back pain   PAIN:  Are you having pain? Yes: NPRS scale: none currently; at worst 5-6 Pain location: Rt shoulder Pain description: sore Aggravating factors: movement Relieving factors: medication  PRECAUTIONS: Other: see protocol for specific ROM and lifting restrictions  RED FLAGS: None   WEIGHT BEARING RESTRICTIONS: Yes RUE < 1lb   FALLS:  Has patient fallen in last 6 months? No  LIVING ENVIRONMENT: Lives with: lives with their spouse Lives in: House/apartment Stairs: Yes: External: 8 steps; can reach both Has following equipment at home: Quad cane small base  OCCUPATION: Retired   PLOF: Independent  PATIENT GOALS: I want to use my arm again.   NEXT MD VISIT: Oct 31  OBJECTIVE:  Note: Objective measures were completed at Evaluation unless otherwise noted.  DIAGNOSTIC FINDINGS:  None on file   PATIENT SURVEYS :  Quick DASH: 81.8% disability   08/16/24 QuickDASH 52.3%   COGNITION: Overall cognitive status: Within functional limits for tasks assessed     SENSATION: Not tested  POSTURE: Rt arm in sling, forward shoulders   UPPER EXTREMITY ROM:   Passive ROM Right eval Left eval 07/08/24 Right  PROM 07/13/24 Right PROM 07/20/24 R  PROM 07/29/24 PROM Right  08/16/24 Right PROM  Shoulder flexion 30   50 89  88 98 deg s  Shoulder extension         Shoulder abduction         Shoulder adduction         Shoulder internal rotation  Shoulder external rotation 0    12 10 deg 0 pain  15 deg s  Elbow flexion         Elbow extension Full         Wrist flexion         Wrist extension         Wrist ulnar deviation         Wrist radial deviation         Wrist pronation         Wrist supination         (Blank rows = not tested)  UPPER EXTREMITY MMT: deferred on eval due to post-op acuity   MMT Right eval Left eval  Shoulder flexion    Shoulder extension    Shoulder abduction    Shoulder adduction    Shoulder internal rotation    Shoulder external rotation    Middle trapezius    Lower trapezius    Elbow flexion    Elbow extension    Wrist flexion    Wrist extension    Wrist ulnar deviation    Wrist radial deviation    Wrist pronation    Wrist supination    Grip strength (lbs)    (Blank rows = not tested)    PALPATION:  Not assessed    [facility] Adult PT Treatment:                                                DATE: 08/25/24  Neuromuscular re-ed: Manually resisted ER isometric. Seated w/ pillow support; 3x5  Manually resisted abd isometric 3x5, seated w/ pillow support 3x5 Standing flexion AAROM w/ cane and mirror for visual feedback 2x5 (neutral then pronated per pt preference) Standing YB shoulder ext to neutral; 3x5 cues for form and lat activation  Therapeutic Activity: Standing flexion AAROM w/ cane (arm in neutral) x5 Standing abduction AAROM w/ cane 3x5   Self Care: Continued education/discussion re: rehab protocol, activity/positional modification PRN, monitoring symptoms and appreciating difference between muscle fatigue and pain   [facility] Adult PT Treatment:                                                DATE: 08/23/24 Therapeutic Exercise: Seated ER AROM x 10   Neuromuscular re-ed: ER  isometric PT resist x 5 Resisted extension to neutral yellow band 2 x 8   Therapeutic Activity: Standing towel slide flexion, unable due to weakness Standing flexion AAROM with dowel x 10  Standing abduction AAROM with dowel x 10  Seated physioball rollout shoulder flexion x 10  Pulleys flexion and scaption x 1 minute  Standing shoulder AROM partial range 2 x 4     [facility] Adult PT Treatment:                                                DATE: 08/18/24 Therapeutic Exercise: Shoulder abduction isometric x 10; 5 sec hold Shoulder ER isometric x 2 d/c due to pain  Reviewed and updated HEP  Manual Therapy: Rt shoulder PROM flexion, ER to tolerance  Neuromuscular re-ed: Scapular retraction  2 x 10   Therapeutic Activity: Supine shoulder flexion AAROM with hand clasp x 2; d/c due to Lt wrist pain Supine shoulder flexion AAROM with cane x 5   Self Care: Continue use of ice Be mindful of guarding/quick movements with the shoulder trying to limit these   [facility] Adult PT Treatment:                                                DATE: 08/16/24 Therapeutic Exercise: Supine short lever flexion w cane x4 discontinued d/t pain Seated passive shoulder flexion slide x8 Seated passive shoulder scaption x8 HEP update + education  Therapeutic Activity: MSK assessment + education Education/discussion re: progress with PT, symptom behavior as it affects activity tolerance, PT goals/POC   Self Care: Education/discussion re: her recent communication w/ surgical team, self care activities, activity modification, reinforcing surgical protocol and post op restrictions as well as gradual progression based on her tolerance   PATIENT EDUCATION: Education details: HEP update Person educated: Patient Education method: Explanation, Demonstration, Tactile cues, and Verbal cues,  Education comprehension: verbalized understanding, returned demonstration, verbal cues required, tactile cues required, and needs  further education  HOME EXERCISE PROGRAM: Access Code: [facility] URL: [email or web address] Date: 08/23/2024 Prepared by: Lucie Meeter  Exercises - Seated Shoulder Flexion Towel Slide at Table Top  - 2 x daily - 7 x weekly - 1 sets - 5 reps - Seated Shoulder Scaption Slide at Table Top with Forearm in Neutral  - 2 x daily - 7 x weekly - 1 sets - 5 reps - Seated Scapular Retraction  - 2 x daily - 7 x weekly - 2 sets - 10 reps - Isometric Shoulder Abduction at Wall  - 2 x daily - 7 x weekly - 1 sets - 10 reps - 5 sec  hold - Shoulder Flexion Overhead with Dowel  - 2 x daily - 7 x weekly - 1 sets - 10 reps - Standing Shoulder Abduction AAROM with Dowel  - 2 x daily - 7 x weekly - 1 sets - 10 reps - Seated Shoulder External Rotation  - 2 x daily - 7 x weekly - 1 sets - 10 reps  ASSESSMENT:  CLINICAL IMPRESSION: 08/25/2024: Pt arrives w/ continued stiffness/weakness but overall improving over the past week. Still having persistent deficits into ER AROM, unable to achieve neutral with gross observation. Does well with isometrics for abd/ER, increased force output noted compared to past visits and no increase in pain. Continues with some stiffness/irritability with AAROM but does better with cues to avoid forceful ROM and shoulder hike. No adverse events, no increase in pain on departure. Continued self care education as above. Recommend continuing along current POC in order to address relevant deficits and improve functional tolerance. Pt departs today's session in no acute distress, all voiced questions/concerns addressed appropriately from PT perspective.     Per eval: Patient is a 73 y.o. female who was seen today for physical therapy evaluation and treatment for s/p Rt lateralized RTSA on 06/29/24. She demonstrates Rt shoulder ROM and strength deficits that are consistent with her recent post-operative status. She will benefit from skilled PT to assist in restoring her RUE strength  and ROM in order to optimize her function and assist in overall pain reduction.    OBJECTIVE IMPAIRMENTS: decreased activity  tolerance, decreased knowledge of condition, decreased knowledge of use of DME, decreased ROM, decreased strength, increased edema, impaired UE functional use, postural dysfunction, and pain.   ACTIVITY LIMITATIONS: carrying, lifting, transfers, bed mobility, bathing, toileting, dressing, self feeding, reach over head, and hygiene/grooming  PARTICIPATION LIMITATIONS: meal prep, cleaning, laundry, driving, shopping, community activity, and yard work  PERSONAL FACTORS: Age, Fitness, Time since onset of injury/illness/exacerbation, and 3+ comorbidities: see PMH above are also affecting patient's functional outcome.   REHAB POTENTIAL: Good  CLINICAL DECISION MAKING: Stable/uncomplicated  EVALUATION COMPLEXITY: Low  GOALS: Goals reviewed with patient? Yes  SHORT TERM GOALS: Target date: 08/12/2024    Patient will be independent and compliant with initial HEP.   Baseline: initial HEP issued  08/16/24: reports intermittent HEP adherence (PT on hold until today)  Goal status: ONGOING  2.  Patient will demonstrate at least 120 degrees of Rt shoulder flexion PROM to facilitate progression towards AROM activity.  Baseline: see above 08/16/24: see ROM chart above Goal status: ONGOING  3.  Patient will demonstrate at least 30 degrees of Rt shoulder ER PROM to facilitate progression towards AROM activity.  Baseline: see above  08/16/24: see ROM chart above Goal status: PROGRESSING   LONG TERM GOALS: Target date: 09/23/2024    Patient will demonstrate at least 140 degrees of shoulder flexion AROM without shrug to improve ability to reach into cabinets.  Baseline: AROM deferred  08/16/24: see ROM chart above Goal status: PROGRESSING  2.  Patient will be able to complete sewing activity with minimal/no Rt shoulder pain.  Baseline: unable  08/16/24: not currently  sewing, as expected Goal status: ONGOING  3.  Patient will demonstrate at least 45 degrees of Rt shoulder ER AROM to improve ability to complete self-care activities.  Baseline: deferred 08/16/24: see ROM chart above Goal status: PROGRESSING  4.  Patient will demonstrate at least 90 degrees of Rt shoulder abduction AROM to improve ability to complete reaching activities.  Baseline: deferred 08/16/24: see ROM chart above Goal status: PROGRESSING  5.  Patient will score </= 20 % disability on the QuickDASH (MCID is [id number]) to signify clinically meaningful improvement in functional abilities.   Baseline: see above 08/16/24: 52.3% Goal status: PROGRESSING   PLAN: PT FREQUENCY: 2x/week  PT DURATION: 12 weeks  PLANNED INTERVENTIONS: 97164- PT Re-evaluation, 97750- Physical Performance Testing, 97110-Therapeutic exercises, 97530- Therapeutic activity, [id number]- Neuromuscular re-education, 97535- Self Care, [id number]- Manual therapy, 97016- Vasopneumatic device, 20560 (1-2 muscles), 20561 (3+ muscles)- Dry Needling, Cryotherapy, and Moist heat  PLAN FOR NEXT SESSION:  AA/PROM within pt tolerance and surgical protocol. Continue with isometrics as tolerated. Symptom modification strategies as indicated/appropriate.   Alm DELENA Jenny PT, DPT 08/25/2024 11:45 AM

## 2024-08-25 ENCOUNTER — Ambulatory Visit: Attending: Orthopaedic Surgery | Admitting: Physical Therapy

## 2024-08-25 ENCOUNTER — Encounter: Payer: Self-pay | Admitting: Physical Therapy

## 2024-08-25 DIAGNOSIS — M25611 Stiffness of right shoulder, not elsewhere classified: Secondary | ICD-10-CM | POA: Diagnosis not present

## 2024-08-25 DIAGNOSIS — M6281 Muscle weakness (generalized): Secondary | ICD-10-CM | POA: Diagnosis not present

## 2024-08-25 DIAGNOSIS — M25511 Pain in right shoulder: Secondary | ICD-10-CM | POA: Insufficient documentation

## 2024-08-25 DIAGNOSIS — R600 Localized edema: Secondary | ICD-10-CM | POA: Diagnosis not present

## 2024-08-30 ENCOUNTER — Ambulatory Visit

## 2024-08-30 DIAGNOSIS — M25611 Stiffness of right shoulder, not elsewhere classified: Secondary | ICD-10-CM | POA: Diagnosis not present

## 2024-08-30 DIAGNOSIS — M25511 Pain in right shoulder: Secondary | ICD-10-CM | POA: Diagnosis not present

## 2024-08-30 DIAGNOSIS — R600 Localized edema: Secondary | ICD-10-CM | POA: Diagnosis not present

## 2024-08-30 DIAGNOSIS — M6281 Muscle weakness (generalized): Secondary | ICD-10-CM

## 2024-08-30 NOTE — Therapy (Signed)
 OUTPATIENT PHYSICAL THERAPY TREATMENT   Patient Name: Melissa Rodgers MRN: [id number] DOB:09-13-1951, 73 y.o., female Today's Date: 08/30/2024       END OF SESSION:  PT End of Session - 08/30/24 1316     Visit Number 13    Number of Visits 24    Date for Recertification  09/23/24    Authorization Type MCR    Progress Note Due on Visit 19    PT Start Time 1316    PT Stop Time 1355    PT Time Calculation (min) 39 min    Activity Tolerance Patient limited by pain    Behavior During Therapy [facility] for tasks assessed/performed                      Past Medical History:  Diagnosis Date   Allergy    seasonal   Cataract 2024   Chronic bilateral low back pain without sciatica 10/07/2016   DDD (degenerative disc disease), cervical 12/03/2020   Diverticulitis of sigmoid colon 08/01/2019   Dysrhythmia    Afib   Essential hypertension 09/09/2018   Family history of diabetes mellitus 06/09/2022   Fibromyalgia    Gastroesophageal reflux disease without esophagitis 11/06/2015   Generalized anxiety disorder with panic attacks 08/01/2019   Hyperlipidemia 09/09/2018   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   Lumbosacral spondylosis 10/07/2016   Mass of lower inner quadrant of right breast 07/11/2022   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 Onetha Larger, [facility]. polyp 2 mm in the sigmoid colon, ( Polypectomy). Moderate diverticulosis of the descending colon and sigmoid colon. Lipoma in the ascending colon. Internal hemorrhoids.   ESOPHAGOGASTRODUODENOSCOPY     possibly last one was 2017. In [place]   HERNIA REPAIR     JOINT REPLACEMENT      MEDIAL PARTIAL KNEE REPLACEMENT Right 12/14/2014   NISSEN FUNDOPLICATION  02/21/2016   OOPHORECTOMY     PARTIAL KNEE ARTHROPLASTY Left 04/05/2014   TOTAL KNEE REVISION Left 05/10/2021   Procedure: TOTAL KNEE REVISION;  Surgeon: Yvone Rush, MD;  Location: [facility];  Service: Orthopedics;  Laterality: Left;   TUBAL LIGATION     Patient Active Problem List   Diagnosis Date Noted   Mild coronary artery disease 05/04/2024   Preoperative cardiovascular examination 05/04/2024   Polyarthralgia with elevated ESR 03/15/2024   Rotator cuff tear, right 01/29/2024   Allergy    Mass of lower inner quadrant of right breast 07/11/2022   Family history of diabetes mellitus 06/09/2022   Paroxysmal atrial fibrillation (HCC) 07/15/2021   DDD (degenerative disc disease), cervical 12/03/2020   Fibromyalgia 03/05/2020   Diverticulitis of sigmoid colon 08/01/2019   Generalized anxiety disorder with panic attacks 08/01/2019   Non-seasonal allergic rhinitis 09/09/2018   Essential hypertension 09/09/2018   Mixed dyslipidemia 09/09/2018   Lumbosacral spondylosis 10/07/2016   Primary osteoarthritis of both knees post bilateral hemiarthroplasty 10/07/2016   Chronic bilateral low back pain without sciatica 10/07/2016   Atrial fibrillation 04/17/2016   S/P laparoscopic fundoplication 03/17/2016   Gastroesophageal reflux disease without esophagitis 11/06/2015   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    PCP: Willo Mini, NP  REFERRING PROVIDER: Cristy Bonner DASEN, MD  REFERRING DIAG: Right lateralized reverse  total shoulder arthroplasty   THERAPY DIAG:  Acute pain of right shoulder  Stiffness of right shoulder, not elsewhere classified  Muscle weakness (generalized)  Localized edema  Rationale for Evaluation and Treatment: Rehabilitation  ONSET DATE: 06/29/24  SUBJECTIVE:                                                                                                                                                                                      Per eval: Patient underwent Rt lateralized RTSA surgery on 06/29/24. Right now the pain is pretty good since taking tylenol  and ibuprofen this morning. Pain has ebbed and flowed over the past 2 days. Does have some tingling in the thumb currently. All of her hobbies involve her arms including crafting and gardening. Wants to be able to use her sewing machine and is having difficulty with basic daily tasks right now.  Hand dominance: Left  SUBJECTIVE STATEMENT: Patient reports the shoulder is doing ok today. Is still limited in ER ROM.    PERTINENT HISTORY: Fibromyalgia  A-fib Chronic low back pain   PAIN:  Are you having pain? Yes: NPRS scale: none currently; at worst 3-4 Pain location: Rt shoulder Pain description: sore Aggravating factors: movement Relieving factors: medication  PRECAUTIONS: Other: see protocol for specific ROM and lifting restrictions  RED FLAGS: None   WEIGHT BEARING RESTRICTIONS: Yes RUE < 1lb   FALLS:  Has patient fallen in last 6 months? No  LIVING ENVIRONMENT: Lives with: lives with their spouse Lives in: House/apartment Stairs: Yes: External: 8 steps; can reach both Has following equipment at home: Quad cane small base  OCCUPATION: Retired   PLOF: Independent  PATIENT GOALS: I want to use my arm again.   NEXT MD VISIT: Oct 31  OBJECTIVE:  Note: Objective measures were completed at Evaluation unless otherwise noted.  DIAGNOSTIC FINDINGS:  None on file   PATIENT SURVEYS :  Quick DASH: 81.8% disability   08/16/24 QuickDASH 52.3%   COGNITION: Overall cognitive status: Within functional limits for tasks assessed     SENSATION: Not tested  POSTURE: Rt arm in sling, forward shoulders   UPPER EXTREMITY ROM:   Passive ROM Right eval Left eval 07/08/24 Right  PROM 07/13/24 Right PROM 07/20/24 R PROM 07/29/24 PROM Right  08/16/24 Right PROM 08/30/24  Shoulder flexion 30   50 89  88 98 deg s 125 AROM  supine scapular plane  Shoulder extension          Shoulder abduction          Shoulder adduction          Shoulder internal rotation  Shoulder external rotation 0    12 10 deg 0 pain  15 deg s 0 AROM/PROM; supine pain   Elbow flexion          Elbow extension Full          Wrist flexion          Wrist extension          Wrist ulnar deviation          Wrist radial deviation          Wrist pronation          Wrist supination          (Blank rows = not tested)  UPPER EXTREMITY MMT: deferred on eval due to post-op acuity   MMT Right eval Left eval  Shoulder flexion    Shoulder extension    Shoulder abduction    Shoulder adduction    Shoulder internal rotation    Shoulder external rotation    Middle trapezius    Lower trapezius    Elbow flexion    Elbow extension    Wrist flexion    Wrist extension    Wrist ulnar deviation    Wrist radial deviation    Wrist pronation    Wrist supination    Grip strength (lbs)    (Blank rows = not tested)    PALPATION:  Not assessed   [facility] Adult PT Treatment:                                                DATE: 08/30/24 Therapeutic Exercise: Sidelying shoulder abduction 2 x 10  ROM measurements  Scaption physioball rollout on inclined plinth x 5  Reviewed and updated HEP  Neuromuscular re-ed: Sidelying ER x 10; with towel roll for scapular plane positioning  Sidelying shoulder flexion 2 x 5; scapular plane  Seated shoulder ER x 10  Self Care: Discussed ongoing pain/limitation into ER with plans to reach out to surgeon regarding this.   [facility] Adult PT Treatment:                                                DATE: 08/25/24  Neuromuscular re-ed: Manually resisted ER isometric. Seated w/ pillow support; 3x5  Manually resisted abd isometric 3x5, seated w/ pillow support 3x5 Standing flexion AAROM w/ cane and mirror for visual feedback 2x5 (neutral then pronated per pt preference) Standing YB shoulder ext to neutral; 3x5  cues for form and lat activation  Therapeutic Activity: Standing flexion AAROM w/ cane (arm in neutral) x5 Standing abduction AAROM w/ cane 3x5   Self Care: Continued education/discussion re: rehab protocol, activity/positional modification PRN, monitoring symptoms and appreciating difference between muscle fatigue and pain   [facility] Adult PT Treatment:                                                DATE: 08/23/24 Therapeutic Exercise: Seated ER AROM x 10   Neuromuscular re-ed: ER isometric PT resist x 5 Resisted extension to neutral yellow band 2 x 8   Therapeutic Activity: Standing  towel slide flexion, unable due to weakness Standing flexion AAROM with dowel x 10  Standing abduction AAROM with dowel x 10  Seated physioball rollout shoulder flexion x 10  Pulleys flexion and scaption x 1 minute  Standing shoulder AROM partial range 2 x 4     [facility] Adult PT Treatment:                                                DATE: 08/18/24 Therapeutic Exercise: Shoulder abduction isometric x 10; 5 sec hold Shoulder ER isometric x 2 d/c due to pain  Reviewed and updated HEP  Manual Therapy: Rt shoulder PROM flexion, ER to tolerance  Neuromuscular re-ed: Scapular retraction  2 x 10   Therapeutic Activity: Supine shoulder flexion AAROM with hand clasp x 2; d/c due to Lt wrist pain Supine shoulder flexion AAROM with cane x 5   Self Care: Continue use of ice Be mindful of guarding/quick movements with the shoulder trying to limit these   [facility] Adult PT Treatment:                                                DATE: 08/16/24 Therapeutic Exercise: Supine short lever flexion w cane x4 discontinued d/t pain Seated passive shoulder flexion slide x8 Seated passive shoulder scaption x8 HEP update + education  Therapeutic Activity: MSK assessment + education Education/discussion re: progress with PT, symptom behavior as it affects activity tolerance, PT goals/POC   Self  Care: Education/discussion re: her recent communication w/ surgical team, self care activities, activity modification, reinforcing surgical protocol and post op restrictions as well as gradual progression based on her tolerance   PATIENT EDUCATION: Education details: HEP update Person educated: Patient Education method: Explanation, Demonstration, Tactile cues, and Verbal cues,  Education comprehension: verbalized understanding, returned demonstration, verbal cues required, tactile cues required, and needs further education  HOME EXERCISE PROGRAM: Access Code: [facility] URL: [email or web address] Date: 08/30/2024 Prepared by: Lucie Meeter  Exercises - Seated Shoulder Flexion Towel Slide at Table Top  - 2 x daily - 7 x weekly - 1 sets - 5 reps - Seated Shoulder Scaption Slide at Table Top with Forearm in Neutral  - 2 x daily - 7 x weekly - 1 sets - 5 reps - Seated Scapular Retraction  - 2 x daily - 7 x weekly - 2 sets - 10 reps - Isometric Shoulder Abduction at Wall  - 2 x daily - 7 x weekly - 1 sets - 10 reps - 5 sec  hold - Shoulder Flexion Overhead with Dowel  - 2 x daily - 7 x weekly - 1 sets - 10 reps - Standing Shoulder Abduction AAROM with Dowel  - 2 x daily - 7 x weekly - 1 sets - 10 reps - Seated Shoulder External Rotation  - 2 x daily - 7 x weekly - 1 sets - 10 reps - Sidelying Shoulder Abduction  - 1 x daily - 7 x weekly - 2 sets - 10 reps  ASSESSMENT:  CLINICAL IMPRESSION: Patient arrives without reports of pain. Focused on Rt shoulder AROM with fairly good tolerance, though fatigues quickly. ER A/PROM remains significantly limited and painful. Discussed this ongoing pain and limited range  and have plans to reach out to surgeon regarding this limitation given length of time since surgery with patient in agreement with this plan. She is tolerating progression and abduction and flexion ROM well, fatiguing with these exercises but no increase in pain.   Per eval:  Patient is a 73 y.o. female who was seen today for physical therapy evaluation and treatment for s/p Rt lateralized RTSA on 06/29/24. She demonstrates Rt shoulder ROM and strength deficits that are consistent with her recent post-operative status. She will benefit from skilled PT to assist in restoring her RUE strength and ROM in order to optimize her function and assist in overall pain reduction.    OBJECTIVE IMPAIRMENTS: decreased activity tolerance, decreased knowledge of condition, decreased knowledge of use of DME, decreased ROM, decreased strength, increased edema, impaired UE functional use, postural dysfunction, and pain.   ACTIVITY LIMITATIONS: carrying, lifting, transfers, bed mobility, bathing, toileting, dressing, self feeding, reach over head, and hygiene/grooming  PARTICIPATION LIMITATIONS: meal prep, cleaning, laundry, driving, shopping, community activity, and yard work  PERSONAL FACTORS: Age, Fitness, Time since onset of injury/illness/exacerbation, and 3+ comorbidities: see PMH above are also affecting patient's functional outcome.   REHAB POTENTIAL: Good  CLINICAL DECISION MAKING: Stable/uncomplicated  EVALUATION COMPLEXITY: Low  GOALS: Goals reviewed with patient? Yes  SHORT TERM GOALS: Target date: 08/12/2024    Patient will be independent and compliant with initial HEP.   Baseline: initial HEP issued  08/16/24: reports intermittent HEP adherence (PT on hold until today)  Goal status: MET  2.  Patient will demonstrate at least 120 degrees of Rt shoulder flexion PROM to facilitate progression towards AROM activity.  Baseline: see above 08/16/24: see ROM chart above Goal status: MET  3.  Patient will demonstrate at least 30 degrees of Rt shoulder ER PROM to facilitate progression towards AROM activity.  Baseline: see above  08/16/24: see ROM chart above Goal status: PROGRESSING   LONG TERM GOALS: Target date: 09/23/2024    Patient will demonstrate at least  140 degrees of shoulder flexion AROM without shrug to improve ability to reach into cabinets.  Baseline: AROM deferred  08/16/24: see ROM chart above Goal status: PROGRESSING  2.  Patient will be able to complete sewing activity with minimal/no Rt shoulder pain.  Baseline: unable  08/16/24: not currently sewing, as expected Goal status: ONGOING  3.  Patient will demonstrate at least 45 degrees of Rt shoulder ER AROM to improve ability to complete self-care activities.  Baseline: deferred 08/16/24: see ROM chart above Goal status: PROGRESSING  4.  Patient will demonstrate at least 90 degrees of Rt shoulder abduction AROM to improve ability to complete reaching activities.  Baseline: deferred 08/16/24: see ROM chart above Goal status: PROGRESSING  5.  Patient will score </= 20 % disability on the QuickDASH (MCID is [id number]) to signify clinically meaningful improvement in functional abilities.   Baseline: see above 08/16/24: 52.3% Goal status: PROGRESSING   PLAN: PT FREQUENCY: 2x/week  PT DURATION: 12 weeks  PLANNED INTERVENTIONS: 97164- PT Re-evaluation, 97750- Physical Performance Testing, 97110-Therapeutic exercises, 97530- Therapeutic activity, [id number]- Neuromuscular re-education, 97535- Self Care, [id number]- Manual therapy, 97016- Vasopneumatic device, 20560 (1-2 muscles), 20561 (3+ muscles)- Dry Needling, Cryotherapy, and Moist heat  PLAN FOR NEXT SESSION:  AA/PROM within pt tolerance and surgical protocol. Continue with isometrics as tolerated. Symptom modification strategies as indicated/appropriate.   Michelena Culmer, PT, DPT, ATC 08/30/24 1:57 PM

## 2024-08-31 ENCOUNTER — Telehealth: Payer: Self-pay

## 2024-08-31 NOTE — Telephone Encounter (Signed)
 Spoke with patient regarding need to place PT on hold and f/u with surgeon after speaking with surgeon's PA regarding ongoing pain and ROM limitations. Patient verbalized understanding.   Zurisadai Helminiak, PT, DPT, ATC 08/31/24 9:45 AM

## 2024-09-06 ENCOUNTER — Encounter

## 2024-09-06 DIAGNOSIS — Z96611 Presence of right artificial shoulder joint: Secondary | ICD-10-CM | POA: Diagnosis not present

## 2024-09-07 DIAGNOSIS — Z23 Encounter for immunization: Secondary | ICD-10-CM | POA: Diagnosis not present

## 2024-09-08 ENCOUNTER — Encounter

## 2024-09-08 ENCOUNTER — Ambulatory Visit

## 2024-09-08 DIAGNOSIS — M25611 Stiffness of right shoulder, not elsewhere classified: Secondary | ICD-10-CM | POA: Diagnosis not present

## 2024-09-08 DIAGNOSIS — M25511 Pain in right shoulder: Secondary | ICD-10-CM

## 2024-09-08 DIAGNOSIS — M6281 Muscle weakness (generalized): Secondary | ICD-10-CM

## 2024-09-08 DIAGNOSIS — R600 Localized edema: Secondary | ICD-10-CM

## 2024-09-08 NOTE — Therapy (Signed)
 OUTPATIENT PHYSICAL THERAPY TREATMENT   Patient Name: Melissa Rodgers MRN: [id number] DOB:September 15, 1951, 73 y.o., female Today's Date: 09/08/2024       END OF SESSION:  PT End of Session - 09/08/24 1149     Visit Number 14    Number of Visits 24    Date for Recertification  09/23/24    Authorization Type MCR    Progress Note Due on Visit 19    PT Start Time 1149    PT Stop Time 1230    PT Time Calculation (min) 41 min    Activity Tolerance Patient limited by pain    Behavior During Therapy [facility] for tasks assessed/performed                       Past Medical History:  Diagnosis Date   Allergy    seasonal   Cataract 2024   Chronic bilateral low back pain without sciatica 10/07/2016   DDD (degenerative disc disease), cervical 12/03/2020   Diverticulitis of sigmoid colon 08/01/2019   Dysrhythmia    Afib   Essential hypertension 09/09/2018   Family history of diabetes mellitus 06/09/2022   Fibromyalgia    Gastroesophageal reflux disease without esophagitis 11/06/2015   Generalized anxiety disorder with panic attacks 08/01/2019   Hyperlipidemia 09/09/2018   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   Lumbosacral spondylosis 10/07/2016   Mass of lower inner quadrant of right breast 07/11/2022   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 Onetha Larger, [facility]. polyp 2 mm in the sigmoid colon, ( Polypectomy). Moderate diverticulosis of the descending colon and sigmoid colon. Lipoma in the ascending colon. Internal hemorrhoids.   ESOPHAGOGASTRODUODENOSCOPY     possibly last one was 2017. In [place]   HERNIA REPAIR     JOINT REPLACEMENT      MEDIAL PARTIAL KNEE REPLACEMENT Right 12/14/2014   NISSEN FUNDOPLICATION  02/21/2016   OOPHORECTOMY     PARTIAL KNEE ARTHROPLASTY Left 04/05/2014   TOTAL KNEE REVISION Left 05/10/2021   Procedure: TOTAL KNEE REVISION;  Surgeon: Yvone Rush, MD;  Location: [facility];  Service: Orthopedics;  Laterality: Left;   TUBAL LIGATION     Patient Active Problem List   Diagnosis Date Noted   Mild coronary artery disease 05/04/2024   Preoperative cardiovascular examination 05/04/2024   Polyarthralgia with elevated ESR 03/15/2024   Rotator cuff tear, right 01/29/2024   Allergy    Mass of lower inner quadrant of right breast 07/11/2022   Family history of diabetes mellitus 06/09/2022   Paroxysmal atrial fibrillation (HCC) 07/15/2021   DDD (degenerative disc disease), cervical 12/03/2020   Fibromyalgia 03/05/2020   Diverticulitis of sigmoid colon 08/01/2019   Generalized anxiety disorder with panic attacks 08/01/2019   Non-seasonal allergic rhinitis 09/09/2018   Essential hypertension 09/09/2018   Mixed dyslipidemia 09/09/2018   Lumbosacral spondylosis 10/07/2016   Primary osteoarthritis of both knees post bilateral hemiarthroplasty 10/07/2016   Chronic bilateral low back pain without sciatica 10/07/2016   Atrial fibrillation 04/17/2016   S/P laparoscopic fundoplication 03/17/2016   Gastroesophageal reflux disease without esophagitis 11/06/2015   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    PCP: Willo Mini, NP  REFERRING PROVIDER: Cristy Bonner DASEN, MD  REFERRING DIAG: Right lateralized  reverse total shoulder arthroplasty   THERAPY DIAG:  Acute pain of right shoulder  Stiffness of right shoulder, not elsewhere classified  Muscle weakness (generalized)  Localized edema  Rationale for Evaluation and Treatment: Rehabilitation  ONSET DATE: 06/29/24  SUBJECTIVE:                                                                                                                                                                                      Per eval: Patient underwent Rt lateralized RTSA surgery on 06/29/24. Right now the pain is pretty good since taking tylenol  and ibuprofen this morning. Pain has ebbed and flowed over the past 2 days. Does have some tingling in the thumb currently. All of her hobbies involve her arms including crafting and gardening. Wants to be able to use her sewing machine and is having difficulty with basic daily tasks right now.  Hand dominance: Left  SUBJECTIVE STATEMENT: Patient had f/u with Dr. Raguel PA yesterday and per patient there are still concerns about the acromion. Was recommended to continue with PT to her tolerance and will f/u in 2 weeks. X-rays were normal. Patient reports the pain is not too bad and can tell she is getting a tiny bit of strength back.    PERTINENT HISTORY: Fibromyalgia  A-fib Chronic low back pain   PAIN:  Are you having pain? Yes: NPRS scale: 1 currently; at worst 6-7 Pain location: Rt shoulder Pain description: sore Aggravating factors: movement Relieving factors: medication  PRECAUTIONS: Other: see protocol for specific ROM and lifting restrictions  RED FLAGS: None   WEIGHT BEARING RESTRICTIONS: Yes RUE < 1lb   FALLS:  Has patient fallen in last 6 months? No  LIVING ENVIRONMENT: Lives with: lives with their spouse Lives in: House/apartment Stairs: Yes: External: 8 steps; can reach both Has following equipment at home: Quad cane small base  OCCUPATION: Retired   PLOF: Independent  PATIENT GOALS: I want to use my arm again.   NEXT MD VISIT: Oct 31  OBJECTIVE:  Note: Objective measures were completed at Evaluation unless otherwise noted.  DIAGNOSTIC FINDINGS:  None on file   PATIENT SURVEYS :  Quick DASH: 81.8% disability   08/16/24 QuickDASH 52.3%   COGNITION: Overall cognitive status: Within functional limits for tasks assessed     SENSATION: Not tested  POSTURE: Rt arm in sling, forward shoulders    UPPER EXTREMITY ROM:   Passive ROM Right eval Left eval 07/08/24 Right  PROM 07/13/24 Right PROM 07/20/24 R PROM 07/29/24 PROM Right  08/16/24 Right PROM 08/30/24 09/08/24  Shoulder flexion 30   50 89  88 98 deg s 125 AROM supine scapular  plane 125 AROM supine scapular plane  Shoulder extension           Shoulder abduction           Shoulder adduction           Shoulder internal rotation           Shoulder external rotation 0    12 10 deg 0 pain  15 deg s 0 AROM/PROM; supine pain    Elbow flexion           Elbow extension Full           Wrist flexion           Wrist extension           Wrist ulnar deviation           Wrist radial deviation           Wrist pronation           Wrist supination           (Blank rows = not tested)  UPPER EXTREMITY MMT: deferred on eval due to post-op acuity   MMT Right eval Left eval  Shoulder flexion    Shoulder extension    Shoulder abduction    Shoulder adduction    Shoulder internal rotation    Shoulder external rotation    Middle trapezius    Lower trapezius    Elbow flexion    Elbow extension    Wrist flexion    Wrist extension    Wrist ulnar deviation    Wrist radial deviation    Wrist pronation    Wrist supination    Grip strength (lbs)    (Blank rows = not tested)    PALPATION:  Not assessed  [facility] Adult PT Treatment:                                                DATE: 09/08/24 Therapeutic Exercise: Standing shoulder flexion, abduction, ER AAROM with dowel x 10 each  Tricep extension yellow band 2 x 10  Reviewed and updated HEP   Therapeutic Activity: Flexion and scaption wall slide d/c due to pain Supine shoulder flexion AROM x 10  Standing shoulder abduction AROM elbow bent 45 degrees x 5    [facility] Adult PT Treatment:                                                DATE: 08/30/24 Therapeutic Exercise: Sidelying shoulder abduction 2 x 10  ROM measurements  Scaption physioball rollout on inclined plinth x 5   Reviewed and updated HEP  Neuromuscular re-ed: Sidelying ER x 10; with towel roll for scapular plane positioning  Sidelying shoulder flexion 2 x 5; scapular plane  Seated shoulder ER x 10  Self Care: Discussed ongoing pain/limitation into ER with plans to reach out to surgeon regarding this.   [facility] Adult PT Treatment:                                                DATE: 08/25/24  Neuromuscular re-ed:  Manually resisted ER isometric. Seated w/ pillow support; 3x5  Manually resisted abd isometric 3x5, seated w/ pillow support 3x5 Standing flexion AAROM w/ cane and mirror for visual feedback 2x5 (neutral then pronated per pt preference) Standing YB shoulder ext to neutral; 3x5 cues for form and lat activation  Therapeutic Activity: Standing flexion AAROM w/ cane (arm in neutral) x5 Standing abduction AAROM w/ cane 3x5   Self Care: Continued education/discussion re: rehab protocol, activity/positional modification PRN, monitoring symptoms and appreciating difference between muscle fatigue and pain   [facility] Adult PT Treatment:                                                DATE: 08/23/24 Therapeutic Exercise: Seated ER AROM x 10   Neuromuscular re-ed: ER isometric PT resist x 5 Resisted extension to neutral yellow band 2 x 8   Therapeutic Activity: Standing towel slide flexion, unable due to weakness Standing flexion AAROM with dowel x 10  Standing abduction AAROM with dowel x 10  Seated physioball rollout shoulder flexion x 10  Pulleys flexion and scaption x 1 minute  Standing shoulder AROM partial range 2 x 4      PATIENT EDUCATION: Education details: HEP update Person educated: Patient Education method: Explanation, Demonstration, Tactile cues, and Verbal cues,  Education comprehension: verbalized understanding, returned demonstration, verbal cues required, tactile cues required, and needs further education  HOME EXERCISE PROGRAM: Access Code: [facility] URL:  [email or web address] Date: 09/08/2024 Prepared by: Lucie Meeter  Exercises - Seated Shoulder Flexion Towel Slide at Table Top  - 2 x daily - 7 x weekly - 1 sets - 5 reps - Seated Shoulder Scaption Slide at Table Top with Forearm in Neutral  - 2 x daily - 7 x weekly - 1 sets - 5 reps - Seated Scapular Retraction  - 2 x daily - 7 x weekly - 2 sets - 10 reps - Isometric Shoulder Abduction at Wall  - 2 x daily - 7 x weekly - 1 sets - 10 reps - 5 sec  hold - Shoulder Flexion Overhead with Dowel  - 2 x daily - 7 x weekly - 1 sets - 10 reps - Standing Shoulder Abduction AAROM with Dowel  - 2 x daily - 7 x weekly - 1 sets - 10 reps - Standing Shoulder External Rotation AAROM with Dowel  - 1 x daily - 7 x weekly - 1 sets - 10 reps - Sidelying Shoulder Abduction  - 1 x daily - 7 x weekly - 2 sets - 10 reps - Supine Shoulder Flexion Extension Full Range AROM  - 1 x daily - 7 x weekly - 1 sets - 10 reps - Standing Elbow Extension with Self-Anchored Resistance  - 1 x daily - 7 x weekly - 2 sets - 10 reps  ASSESSMENT:  CLINICAL IMPRESSION: Patient had f/u with PA yesterday and was instructed to continue with PT to tolerance. We focused on AAROM and AROM activity for the Rt shoulder today with fairly good tolerance. She does not tolerate wall slides reporting pain and weakness with this activity, so discontinued. She reported initial pain in the tricep during descent of shoulder flexion AROM in supine, but with continued reps was able to complete these activity pain free through 125 degrees. Remains most limited with AAROM ER secondary to pain.   Per  eval: Patient is a 73 y.o. female who was seen today for physical therapy evaluation and treatment for s/p Rt lateralized RTSA on 06/29/24. She demonstrates Rt shoulder ROM and strength deficits that are consistent with her recent post-operative status. She will benefit from skilled PT to assist in restoring her RUE strength and ROM in order to  optimize her function and assist in overall pain reduction.    OBJECTIVE IMPAIRMENTS: decreased activity tolerance, decreased knowledge of condition, decreased knowledge of use of DME, decreased ROM, decreased strength, increased edema, impaired UE functional use, postural dysfunction, and pain.   ACTIVITY LIMITATIONS: carrying, lifting, transfers, bed mobility, bathing, toileting, dressing, self feeding, reach over head, and hygiene/grooming  PARTICIPATION LIMITATIONS: meal prep, cleaning, laundry, driving, shopping, community activity, and yard work  PERSONAL FACTORS: Age, Fitness, Time since onset of injury/illness/exacerbation, and 3+ comorbidities: see PMH above are also affecting patient's functional outcome.   REHAB POTENTIAL: Good  CLINICAL DECISION MAKING: Stable/uncomplicated  EVALUATION COMPLEXITY: Low  GOALS: Goals reviewed with patient? Yes  SHORT TERM GOALS: Target date: 08/12/2024    Patient will be independent and compliant with initial HEP.   Baseline: initial HEP issued  08/16/24: reports intermittent HEP adherence (PT on hold until today)  Goal status: MET  2.  Patient will demonstrate at least 120 degrees of Rt shoulder flexion PROM to facilitate progression towards AROM activity.  Baseline: see above 08/16/24: see ROM chart above Goal status: MET  3.  Patient will demonstrate at least 30 degrees of Rt shoulder ER PROM to facilitate progression towards AROM activity.  Baseline: see above  08/16/24: see ROM chart above Goal status: PROGRESSING   LONG TERM GOALS: Target date: 09/23/2024    Patient will demonstrate at least 140 degrees of shoulder flexion AROM without shrug to improve ability to reach into cabinets.  Baseline: AROM deferred  08/16/24: see ROM chart above Goal status: PROGRESSING  2.  Patient will be able to complete sewing activity with minimal/no Rt shoulder pain.  Baseline: unable  08/16/24: not currently sewing, as expected Goal  status: ONGOING  3.  Patient will demonstrate at least 45 degrees of Rt shoulder ER AROM to improve ability to complete self-care activities.  Baseline: deferred 08/16/24: see ROM chart above Goal status: PROGRESSING  4.  Patient will demonstrate at least 90 degrees of Rt shoulder abduction AROM to improve ability to complete reaching activities.  Baseline: deferred 08/16/24: see ROM chart above Goal status: PROGRESSING  5.  Patient will score </= 20 % disability on the QuickDASH (MCID is [id number]) to signify clinically meaningful improvement in functional abilities.   Baseline: see above 08/16/24: 52.3% Goal status: PROGRESSING   PLAN: PT FREQUENCY: 2x/week  PT DURATION: 12 weeks  PLANNED INTERVENTIONS: 97164- PT Re-evaluation, 97750- Physical Performance Testing, 97110-Therapeutic exercises, 97530- Therapeutic activity, [id number]- Neuromuscular re-education, 97535- Self Care, [id number]- Manual therapy, 97016- Vasopneumatic device, 20560 (1-2 muscles), 20561 (3+ muscles)- Dry Needling, Cryotherapy, and Moist heat  PLAN FOR NEXT SESSION:  AA/PROM within pt tolerance and surgical protocol. Continue with isometrics as tolerated. Symptom modification strategies as indicated/appropriate.   Kalla Watson, PT, DPT, ATC 09/08/24 12:54 PM

## 2024-09-13 ENCOUNTER — Ambulatory Visit

## 2024-09-13 DIAGNOSIS — Z23 Encounter for immunization: Secondary | ICD-10-CM | POA: Diagnosis not present

## 2024-09-15 ENCOUNTER — Encounter

## 2024-09-15 ENCOUNTER — Ambulatory Visit

## 2024-09-15 DIAGNOSIS — M25511 Pain in right shoulder: Secondary | ICD-10-CM

## 2024-09-15 DIAGNOSIS — M25611 Stiffness of right shoulder, not elsewhere classified: Secondary | ICD-10-CM | POA: Diagnosis not present

## 2024-09-15 DIAGNOSIS — M6281 Muscle weakness (generalized): Secondary | ICD-10-CM | POA: Diagnosis not present

## 2024-09-15 DIAGNOSIS — R600 Localized edema: Secondary | ICD-10-CM | POA: Diagnosis not present

## 2024-09-15 NOTE — Therapy (Signed)
 OUTPATIENT PHYSICAL THERAPY TREATMENT   Patient Name: Melissa Rodgers MRN: [id number] DOB:Jan 01, 1951, 73 y.o., female Today's Date: 09/15/2024       END OF SESSION:  PT End of Session - 09/15/24 1400     Visit Number 15    Number of Visits 24    Date for Recertification  09/23/24    Authorization Type MCR    Progress Note Due on Visit 19    PT Start Time 1400    PT Stop Time 1443    PT Time Calculation (min) 43 min    Activity Tolerance Patient tolerated treatment well    Behavior During Therapy [facility] for tasks assessed/performed                        Past Medical History:  Diagnosis Date   Allergy    seasonal   Cataract 2024   Chronic bilateral low back pain without sciatica 10/07/2016   DDD (degenerative disc disease), cervical 12/03/2020   Diverticulitis of sigmoid colon 08/01/2019   Dysrhythmia    Afib   Essential hypertension 09/09/2018   Family history of diabetes mellitus 06/09/2022   Fibromyalgia    Gastroesophageal reflux disease without esophagitis 11/06/2015   Generalized anxiety disorder with panic attacks 08/01/2019   Hyperlipidemia 09/09/2018   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   Lumbosacral spondylosis 10/07/2016   Mass of lower inner quadrant of right breast 07/11/2022   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 Onetha Larger, [facility]. polyp 2 mm in the sigmoid colon, ( Polypectomy). Moderate diverticulosis of the descending colon and sigmoid colon. Lipoma in the ascending colon. Internal hemorrhoids.   ESOPHAGOGASTRODUODENOSCOPY     possibly last one was 2017. In [place]   HERNIA REPAIR     JOINT  REPLACEMENT     MEDIAL PARTIAL KNEE REPLACEMENT Right 12/14/2014   NISSEN FUNDOPLICATION  02/21/2016   OOPHORECTOMY     PARTIAL KNEE ARTHROPLASTY Left 04/05/2014   TOTAL KNEE REVISION Left 05/10/2021   Procedure: TOTAL KNEE REVISION;  Surgeon: Yvone Rush, MD;  Location: [facility];  Service: Orthopedics;  Laterality: Left;   TUBAL LIGATION     Patient Active Problem List   Diagnosis Date Noted   Mild coronary artery disease 05/04/2024   Preoperative cardiovascular examination 05/04/2024   Polyarthralgia with elevated ESR 03/15/2024   Rotator cuff tear, right 01/29/2024   Allergy    Mass of lower inner quadrant of right breast 07/11/2022   Family history of diabetes mellitus 06/09/2022   Paroxysmal atrial fibrillation (HCC) 07/15/2021   DDD (degenerative disc disease), cervical 12/03/2020   Fibromyalgia 03/05/2020   Diverticulitis of sigmoid colon 08/01/2019   Generalized anxiety disorder with panic attacks 08/01/2019   Non-seasonal allergic rhinitis 09/09/2018   Essential hypertension 09/09/2018   Mixed dyslipidemia 09/09/2018   Lumbosacral spondylosis 10/07/2016   Primary osteoarthritis of both knees post bilateral hemiarthroplasty 10/07/2016   Chronic bilateral low back pain without sciatica 10/07/2016   Atrial fibrillation 04/17/2016   S/P laparoscopic fundoplication 03/17/2016   Gastroesophageal reflux disease without esophagitis 11/06/2015   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    PCP: Willo Mini, NP  REFERRING PROVIDER: Cristy Bonner DASEN, MD  REFERRING DIAG: Right  lateralized reverse total shoulder arthroplasty   THERAPY DIAG:  Acute pain of right shoulder  Stiffness of right shoulder, not elsewhere classified  Muscle weakness (generalized)  Localized edema  Rationale for Evaluation and Treatment: Rehabilitation  ONSET DATE: 06/29/24  SUBJECTIVE:                                                                                                                                                                                      Per eval: Patient underwent Rt lateralized RTSA surgery on 06/29/24. Right now the pain is pretty good since taking tylenol  and ibuprofen this morning. Pain has ebbed and flowed over the past 2 days. Does have some tingling in the thumb currently. All of her hobbies involve her arms including crafting and gardening. Wants to be able to use her sewing machine and is having difficulty with basic daily tasks right now.  Hand dominance: Left  SUBJECTIVE STATEMENT: Patient reports in some ways the shoulder feels better. Was more active over the past week, which has caused fatigue and soreness.    PERTINENT HISTORY: Fibromyalgia  A-fib Chronic low back pain   PAIN:  Are you having pain? Yes: NPRS scale: 4 Pain location: Rt anterior shoulder Pain description: sore, ache Aggravating factors: movement Relieving factors: medication  PRECAUTIONS: Other: see protocol for specific ROM and lifting restrictions  RED FLAGS: None   WEIGHT BEARING RESTRICTIONS: Yes RUE < 1lb   FALLS:  Has patient fallen in last 6 months? No  LIVING ENVIRONMENT: Lives with: lives with their spouse Lives in: House/apartment Stairs: Yes: External: 8 steps; can reach both Has following equipment at home: Quad cane small base  OCCUPATION: Retired   PLOF: Independent  PATIENT GOALS: I want to use my arm again.   NEXT MD VISIT: Oct 31  OBJECTIVE:  Note: Objective measures were completed at Evaluation unless otherwise noted.  DIAGNOSTIC FINDINGS:  None on file   PATIENT SURVEYS :  Quick DASH: 81.8% disability   08/16/24 QuickDASH 52.3%   COGNITION: Overall cognitive status: Within functional limits for tasks assessed     SENSATION: Not tested  POSTURE: Rt arm in sling, forward shoulders   UPPER EXTREMITY ROM:   Passive ROM Right eval Left eval 07/08/24 Right  PROM 07/13/24 Right PROM 07/20/24 R PROM 07/29/24 PROM Right  08/16/24 Right PROM  08/30/24 09/08/24  Shoulder flexion 30   50 89  88 98 deg s 125 AROM supine scapular plane 125 AROM supine scapular plane  Shoulder extension           Shoulder abduction           Shoulder adduction  Shoulder internal rotation           Shoulder external rotation 0    12 10 deg 0 pain  15 deg s 0 AROM/PROM; supine pain    Elbow flexion           Elbow extension Full           Wrist flexion           Wrist extension           Wrist ulnar deviation           Wrist radial deviation           Wrist pronation           Wrist supination           (Blank rows = not tested)  UPPER EXTREMITY MMT: deferred on eval due to post-op acuity   MMT Right eval Left eval  Shoulder flexion    Shoulder extension    Shoulder abduction    Shoulder adduction    Shoulder internal rotation    Shoulder external rotation    Middle trapezius    Lower trapezius    Elbow flexion    Elbow extension    Wrist flexion    Wrist extension    Wrist ulnar deviation    Wrist radial deviation    Wrist pronation    Wrist supination    Grip strength (lbs)    (Blank rows = not tested)    PALPATION:  Not assessed  [facility] Adult PT Treatment:                                                DATE: 09/15/24 Therapeutic Exercise: Sidelying shoulder abduction 2 x 10  Standing bicep curl yellow band 2 x 10  HEP update  Manual Therapy: STM Rt bicep, deltoid, triceps   Therapeutic Activity: Pulleys flexion/scaption x 2 minutes each  Supine shoulder flexion AROM x 10  Seated shoulder flexion AROM 2 x 8  Seated shoulder abduction elbow bent x 5     [facility] Adult PT Treatment:                                                DATE: 09/08/24 Therapeutic Exercise: Standing shoulder flexion, abduction, ER AAROM with dowel x 10 each  Tricep extension yellow band 2 x 10  Reviewed and updated HEP   Therapeutic Activity: Flexion and scaption wall slide d/c due to pain Supine shoulder flexion AROM x 10  Standing  shoulder abduction AROM elbow bent 45 degrees x 5    [facility] Adult PT Treatment:                                                DATE: 08/30/24 Therapeutic Exercise: Sidelying shoulder abduction 2 x 10  ROM measurements  Scaption physioball rollout on inclined plinth x 5  Reviewed and updated HEP  Neuromuscular re-ed: Sidelying ER x 10; with towel roll for scapular plane positioning  Sidelying shoulder flexion 2 x 5; scapular plane  Seated shoulder ER x 10  Self Care: Discussed ongoing pain/limitation into ER with plans to reach out to surgeon regarding this.   [facility] Adult PT Treatment:                                                DATE: 08/25/24  Neuromuscular re-ed: Manually resisted ER isometric. Seated w/ pillow support; 3x5  Manually resisted abd isometric 3x5, seated w/ pillow support 3x5 Standing flexion AAROM w/ cane and mirror for visual feedback 2x5 (neutral then pronated per pt preference) Standing YB shoulder ext to neutral; 3x5 cues for form and lat activation  Therapeutic Activity: Standing flexion AAROM w/ cane (arm in neutral) x5 Standing abduction AAROM w/ cane 3x5   Self Care: Continued education/discussion re: rehab protocol, activity/positional modification PRN, monitoring symptoms and appreciating difference between muscle fatigue and pain   [facility] Adult PT Treatment:                                                DATE: 08/23/24 Therapeutic Exercise: Seated ER AROM x 10   Neuromuscular re-ed: ER isometric PT resist x 5 Resisted extension to neutral yellow band 2 x 8   Therapeutic Activity: Standing towel slide flexion, unable due to weakness Standing flexion AAROM with dowel x 10  Standing abduction AAROM with dowel x 10  Seated physioball rollout shoulder flexion x 10  Pulleys flexion and scaption x 1 minute  Standing shoulder AROM partial range 2 x 4      PATIENT EDUCATION: Education details: HEP update Person educated: Patient Education method:  Explanation, Demonstration, Tactile cues, and Verbal cues,  Education comprehension: verbalized understanding, returned demonstration, verbal cues required, tactile cues required, and needs further education  HOME EXERCISE PROGRAM: Access Code: [facility] URL: [email or web address] Date: 09/15/2024 Prepared by: Lucie Meeter  Exercises - Seated Shoulder Flexion Towel Slide at Table Top  - 2 x daily - 7 x weekly - 1 sets - 5 reps - Seated Shoulder Scaption Slide at Table Top with Forearm in Neutral  - 2 x daily - 7 x weekly - 1 sets - 5 reps - Seated Scapular Retraction  - 2 x daily - 7 x weekly - 2 sets - 10 reps - Isometric Shoulder Abduction at Wall  - 2 x daily - 7 x weekly - 1 sets - 10 reps - 5 sec  hold - Shoulder Flexion Overhead with Dowel  - 2 x daily - 7 x weekly - 1 sets - 10 reps - Standing Shoulder Abduction AAROM with Dowel  - 2 x daily - 7 x weekly - 1 sets - 10 reps - Standing Shoulder External Rotation AAROM with Dowel  - 1 x daily - 7 x weekly - 1 sets - 10 reps - Standing Elbow Extension with Self-Anchored Resistance  - 1 x daily - 7 x weekly - 2 sets - 10 reps - Standing Shoulder Flexion to 90 Degrees  - 1 x daily - 7 x weekly - 2 sets - 5 reps - Standing Single Arm Elbow Flexion with Resistance  - 1 x daily - 7 x weekly - 2 sets - 10 reps - Seated Shoulder Abduction with Bent Elbow  - 1 x daily - 7 x weekly -  1 sets - 10 reps  ASSESSMENT:  CLINICAL IMPRESSION: Continued with shoulder ROM activity and strengthening as tolerated. With sidelying shoulder abduction she had spasm in the triceps following 10 reps. Spasm was improved during second set following STM and cues to activate deltoid vs. Triceps with this movement. Introduced gravity dependent AROM with good tolerance, though requires cues to reduce upper trap engagement. Challenged with bicep curl secondary to weakness, but no reports of pain. With transition from supine to sit she would experience  occasional intense pain in the anterior shoulder, but overall tolerated session well.   Per eval: Patient is a 73 y.o. female who was seen today for physical therapy evaluation and treatment for s/p Rt lateralized RTSA on 06/29/24. She demonstrates Rt shoulder ROM and strength deficits that are consistent with her recent post-operative status. She will benefit from skilled PT to assist in restoring her RUE strength and ROM in order to optimize her function and assist in overall pain reduction.    OBJECTIVE IMPAIRMENTS: decreased activity tolerance, decreased knowledge of condition, decreased knowledge of use of DME, decreased ROM, decreased strength, increased edema, impaired UE functional use, postural dysfunction, and pain.   ACTIVITY LIMITATIONS: carrying, lifting, transfers, bed mobility, bathing, toileting, dressing, self feeding, reach over head, and hygiene/grooming  PARTICIPATION LIMITATIONS: meal prep, cleaning, laundry, driving, shopping, community activity, and yard work  PERSONAL FACTORS: Age, Fitness, Time since onset of injury/illness/exacerbation, and 3+ comorbidities: see PMH above are also affecting patient's functional outcome.   REHAB POTENTIAL: Good  CLINICAL DECISION MAKING: Stable/uncomplicated  EVALUATION COMPLEXITY: Low  GOALS: Goals reviewed with patient? Yes  SHORT TERM GOALS: Target date: 08/12/2024    Patient will be independent and compliant with initial HEP.   Baseline: initial HEP issued  08/16/24: reports intermittent HEP adherence (PT on hold until today)  Goal status: MET  2.  Patient will demonstrate at least 120 degrees of Rt shoulder flexion PROM to facilitate progression towards AROM activity.  Baseline: see above 08/16/24: see ROM chart above Goal status: MET  3.  Patient will demonstrate at least 30 degrees of Rt shoulder ER PROM to facilitate progression towards AROM activity.  Baseline: see above  08/16/24: see ROM chart above Goal status:  PROGRESSING   LONG TERM GOALS: Target date: 09/23/2024    Patient will demonstrate at least 140 degrees of shoulder flexion AROM without shrug to improve ability to reach into cabinets.  Baseline: AROM deferred  08/16/24: see ROM chart above Goal status: PROGRESSING  2.  Patient will be able to complete sewing activity with minimal/no Rt shoulder pain.  Baseline: unable  08/16/24: not currently sewing, as expected Goal status: ONGOING  3.  Patient will demonstrate at least 45 degrees of Rt shoulder ER AROM to improve ability to complete self-care activities.  Baseline: deferred 08/16/24: see ROM chart above Goal status: PROGRESSING  4.  Patient will demonstrate at least 90 degrees of Rt shoulder abduction AROM to improve ability to complete reaching activities.  Baseline: deferred 08/16/24: see ROM chart above Goal status: PROGRESSING  5.  Patient will score </= 20 % disability on the QuickDASH (MCID is [id number]) to signify clinically meaningful improvement in functional abilities.   Baseline: see above 08/16/24: 52.3% Goal status: PROGRESSING   PLAN: PT FREQUENCY: 2x/week  PT DURATION: 12 weeks  PLANNED INTERVENTIONS: 97164- PT Re-evaluation, 97750- Physical Performance Testing, 97110-Therapeutic exercises, 97530- Therapeutic activity, [id number]- Neuromuscular re-education, 97535- Self Care, [id number]- Manual therapy, 97016- Vasopneumatic device, 79439 (1-2 muscles), 79438 (  3+ muscles)- Dry Needling, Cryotherapy, and Moist heat  PLAN FOR NEXT SESSION:  AA/PROM within pt tolerance and surgical protocol. Continue with isometrics as tolerated. Symptom modification strategies as indicated/appropriate. RE-CERT  Gorge Almanza, PT, DPT, ATC 09/15/24 2:47 PM

## 2024-09-20 ENCOUNTER — Encounter

## 2024-09-22 ENCOUNTER — Encounter

## 2024-09-22 ENCOUNTER — Ambulatory Visit

## 2024-09-22 DIAGNOSIS — M25611 Stiffness of right shoulder, not elsewhere classified: Secondary | ICD-10-CM | POA: Diagnosis not present

## 2024-09-22 DIAGNOSIS — M25511 Pain in right shoulder: Secondary | ICD-10-CM

## 2024-09-22 DIAGNOSIS — R600 Localized edema: Secondary | ICD-10-CM | POA: Diagnosis not present

## 2024-09-22 DIAGNOSIS — M6281 Muscle weakness (generalized): Secondary | ICD-10-CM

## 2024-09-22 NOTE — Therapy (Signed)
 OUTPATIENT PHYSICAL THERAPY TREATMENT RE-CERTIFICATION   Patient Name: Melissa Rodgers MRN: [id number] DOB:06/18/1951, 73 y.o., female Today's Date: 09/22/2024       END OF SESSION:  PT End of Session - 09/22/24 1358     Visit Number 16    Number of Visits 24    Date for Recertification  11/19/24    Authorization Type MCR    Progress Note Due on Visit 19    PT Start Time 1358    PT Stop Time 1442    PT Time Calculation (min) 44 min    Activity Tolerance Patient tolerated treatment well    Behavior During Therapy [facility] for tasks assessed/performed                         Past Medical History:  Diagnosis Date   Allergy    seasonal   Cataract 2024   Chronic bilateral low back pain without sciatica 10/07/2016   DDD (degenerative disc disease), cervical 12/03/2020   Diverticulitis of sigmoid colon 08/01/2019   Dysrhythmia    Afib   Essential hypertension 09/09/2018   Family history of diabetes mellitus 06/09/2022   Fibromyalgia    Gastroesophageal reflux disease without esophagitis 11/06/2015   Generalized anxiety disorder with panic attacks 08/01/2019   Hyperlipidemia 09/09/2018   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   Lumbosacral spondylosis 10/07/2016   Mass of lower inner quadrant of right breast 07/11/2022   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 Onetha Larger, [facility]. polyp 2 mm in the sigmoid colon, ( Polypectomy). Moderate diverticulosis of the descending colon and sigmoid colon. Lipoma in the ascending colon. Internal hemorrhoids.   ESOPHAGOGASTRODUODENOSCOPY     possibly last one was 2017. In [place]   HERNIA  REPAIR     JOINT REPLACEMENT     MEDIAL PARTIAL KNEE REPLACEMENT Right 12/14/2014   NISSEN FUNDOPLICATION  02/21/2016   OOPHORECTOMY     PARTIAL KNEE ARTHROPLASTY Left 04/05/2014   TOTAL KNEE REVISION Left 05/10/2021   Procedure: TOTAL KNEE REVISION;  Surgeon: Yvone Rush, MD;  Location: [facility];  Service: Orthopedics;  Laterality: Left;   TUBAL LIGATION     Patient Active Problem List   Diagnosis Date Noted   Mild coronary artery disease 05/04/2024   Preoperative cardiovascular examination 05/04/2024   Polyarthralgia with elevated ESR 03/15/2024   Rotator cuff tear, right 01/29/2024   Allergy    Mass of lower inner quadrant of right breast 07/11/2022   Family history of diabetes mellitus 06/09/2022   Paroxysmal atrial fibrillation (HCC) 07/15/2021   DDD (degenerative disc disease), cervical 12/03/2020   Fibromyalgia 03/05/2020   Diverticulitis of sigmoid colon 08/01/2019   Generalized anxiety disorder with panic attacks 08/01/2019   Non-seasonal allergic rhinitis 09/09/2018   Essential hypertension 09/09/2018   Mixed dyslipidemia 09/09/2018   Lumbosacral spondylosis 10/07/2016   Primary osteoarthritis of both knees post bilateral hemiarthroplasty 10/07/2016   Chronic bilateral low back pain without sciatica 10/07/2016   Atrial fibrillation 04/17/2016   S/P laparoscopic fundoplication 03/17/2016   Gastroesophageal reflux disease without esophagitis 11/06/2015   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    PCP: Willo Mini, NP  REFERRING PROVIDER: Cristy Bonner DASEN, MD  REFERRING  DIAG: Right lateralized reverse total shoulder arthroplasty   THERAPY DIAG:  Acute pain of right shoulder  Stiffness of right shoulder, not elsewhere classified  Muscle weakness (generalized)  Localized edema  Rationale for Evaluation and Treatment: Rehabilitation  ONSET DATE: 06/29/24  SUBJECTIVE:                                                                                                                                                                                      Per eval: Patient underwent Rt lateralized RTSA surgery on 06/29/24. Right now the pain is pretty good since taking tylenol  and ibuprofen this morning. Pain has ebbed and flowed over the past 2 days. Does have some tingling in the thumb currently. All of her hobbies involve her arms including crafting and gardening. Wants to be able to use her sewing machine and is having difficulty with basic daily tasks right now.  Hand dominance: Left  SUBJECTIVE STATEMENT: Patient reports overall she is feeling pretty good. Is starting to use the RUE more functionally. Will still get pain with rotational reaching and pulling activity. Gets pain with resisted elbow flexion and ER remains limited/painful.    PERTINENT HISTORY: Fibromyalgia  A-fib Chronic low back pain   PAIN:  Are you having pain? Yes: NPRS scale: none currently; at worst 4/10  Pain location: Rt anterior shoulder Pain description: sore, ache Aggravating factors: see subjective above Relieving factors: medication  PRECAUTIONS: Other: see protocol for specific ROM and lifting restrictions  RED FLAGS: None   WEIGHT BEARING RESTRICTIONS: RUE WBAT  FALLS:  Has patient fallen in last 6 months? No  LIVING ENVIRONMENT: Lives with: lives with their spouse Lives in: House/apartment Stairs: Yes: External: 8 steps; can reach both Has following equipment at home: Quad cane small base  OCCUPATION: Retired   PLOF: Independent  PATIENT GOALS: I want to use my arm again.   NEXT MD VISIT: Oct 31  OBJECTIVE:  Note: Objective measures were completed at Evaluation unless otherwise noted.  DIAGNOSTIC FINDINGS:  None on file   PATIENT SURVEYS :  Quick DASH: 81.8% disability   08/16/24 QuickDASH 52.3%   09/22/24: QuickDASH: 43% disability   COGNITION: Overall cognitive status: Within functional limits for tasks assessed     SENSATION: Not tested  POSTURE: Rt  arm in sling, forward shoulders   UPPER EXTREMITY ROM:   Passive ROM Right eval Left eval 07/08/24 Right  PROM 07/13/24 Right PROM 07/20/24 R PROM 07/29/24 PROM Right  08/16/24 Right PROM 08/30/24 09/08/24 09/22/24 Rt   Shoulder flexion 30   50 89  88 98 deg s 125 AROM supine scapular plane 125 AROM supine scapular plane  145 AROM supine scapular plane; 120 AROM in standing  Shoulder extension            Shoulder abduction          120 AROM supine; 80 AROM in standing  Shoulder adduction            Shoulder internal rotation            Shoulder external rotation 0    12 10 deg 0 pain  15 deg s 0 AROM/PROM; supine pain   0 supine AROM and PROM pain  Elbow flexion            Elbow extension Full            Wrist flexion            Wrist extension            Wrist ulnar deviation            Wrist radial deviation            Wrist pronation            Wrist supination            (Blank rows = not tested)  UPPER EXTREMITY MMT: deferred on eval due to post-op acuity   MMT Right eval Left eval  Shoulder flexion    Shoulder extension    Shoulder abduction    Shoulder adduction    Shoulder internal rotation    Shoulder external rotation    Middle trapezius    Lower trapezius    Elbow flexion    Elbow extension    Wrist flexion    Wrist extension    Wrist ulnar deviation    Wrist radial deviation    Wrist pronation    Wrist supination    Grip strength (lbs)    (Blank rows = not tested)    PALPATION:  Not assessed  [facility] Adult PT Treatment:                                                DATE: 09/22/24 Therapeutic Exercise: HEP update  Bicep curl 1 lb 2 x 5   Neuromuscular re-ed: Standing shoulder extension with scapular retraction 2 x 10  Rows 2 x 10 red band  Standing shoulder abduction AROM at mirror x 10  Therapeutic Activity: Re-assessment to determine overall progress, educating patient on progress towards goals  Functional IR reach RUE x 10  Inclined shoulder  flexion x 10     [facility] Adult PT Treatment:                                                DATE: 09/15/24 Therapeutic Exercise: Sidelying shoulder abduction 2 x 10  Standing bicep curl yellow band 2 x 10  HEP update  Manual Therapy: STM Rt bicep, deltoid, triceps   Therapeutic Activity: Pulleys flexion/scaption x 2 minutes each  Supine shoulder flexion AROM x 10  Seated shoulder flexion AROM 2 x 8  Seated shoulder abduction elbow bent x 5     [facility] Adult PT Treatment:  DATE: 09/08/24 Therapeutic Exercise: Standing shoulder flexion, abduction, ER AAROM with dowel x 10 each  Tricep extension yellow band 2 x 10  Reviewed and updated HEP   Therapeutic Activity: Flexion and scaption wall slide d/c due to pain Supine shoulder flexion AROM x 10  Standing shoulder abduction AROM elbow bent 45 degrees x 5    [facility] Adult PT Treatment:                                                DATE: 08/30/24 Therapeutic Exercise: Sidelying shoulder abduction 2 x 10  ROM measurements  Scaption physioball rollout on inclined plinth x 5  Reviewed and updated HEP  Neuromuscular re-ed: Sidelying ER x 10; with towel roll for scapular plane positioning  Sidelying shoulder flexion 2 x 5; scapular plane  Seated shoulder ER x 10  Self Care: Discussed ongoing pain/limitation into ER with plans to reach out to surgeon regarding this.     PATIENT EDUCATION: Education details: HEP update Person educated: Patient Education method: Explanation, Demonstration, Tactile cues, and Verbal cues,  Education comprehension: verbalized understanding, returned demonstration, verbal cues required, tactile cues required, and needs further education  HOME EXERCISE PROGRAM: Access Code: [facility] URL: [email or web address] Date: 09/22/2024 Prepared by: Lucie Meeter  Exercises - Seated Shoulder Flexion Towel Slide at Table Top  - 1 x daily - 7 x weekly - 1  sets - 5 reps - Seated Shoulder Scaption Slide at Table Top with Forearm in Neutral  - 1 x daily - 7 x weekly - 1 sets - 5 reps - Isometric Shoulder Abduction at Wall  - 1 x daily - 7 x weekly - 1 sets - 10 reps - 5 sec  hold - Shoulder Flexion Overhead with Dowel  - 1 x daily - 7 x weekly - 1 sets - 10 reps - Standing Shoulder Abduction AAROM with Dowel  - 1 x daily - 7 x weekly - 1 sets - 10 reps - Standing Shoulder External Rotation AAROM with Dowel  - 1 x daily - 7 x weekly - 1 sets - 10 reps - Standing Shoulder Flexion to 90 Degrees  - 1 x daily - 7 x weekly - 2 sets - 5 reps - Seated Shoulder Abduction with Bent Elbow  - 1 x daily - 7 x weekly - 1 sets - 10 reps - Standing Shoulder Extension  - 1 x daily - 7 x weekly - 2 sets - 10 reps - Standing Elbow Extension with Self-Anchored Resistance  - 1 x daily - 3 x weekly - 2 sets - 10 reps - Standing Single Arm Elbow Flexion with Resistance  - 1 x daily - 3 x weekly - 2 sets - 10 reps - Standing Shoulder Row with Anchored Resistance  - 1 x daily - 3 x weekly - 2 sets - 10 reps  ASSESSMENT:  CLINICAL IMPRESSION: Patient is making slow, but gradual progress in PT s/p Rt lateralized RTSA on 06/29/24. She has had a few setbacks since her surgery requiring lapses in care due to increased pain/limited range of the shoulder. At this time she demonstrates overall improvements in shoulder flexion and abduction AROM, but ER remains limited and painful. Introduced extension/IR ROM today as she is 12 weeks post-op with good tolerance. She will benefit from continued skilled PT to address lingering ROM and  strength deficits in order to optimize functional use of the RUE.   Per eval: Patient is a 73 y.o. female who was seen today for physical therapy evaluation and treatment for s/p Rt lateralized RTSA on 06/29/24. She demonstrates Rt shoulder ROM and strength deficits that are consistent with her recent post-operative status. She will benefit from skilled PT to  assist in restoring her RUE strength and ROM in order to optimize her function and assist in overall pain reduction.    OBJECTIVE IMPAIRMENTS: decreased activity tolerance, decreased knowledge of condition, decreased knowledge of use of DME, decreased ROM, decreased strength, increased edema, impaired UE functional use, postural dysfunction, and pain.   ACTIVITY LIMITATIONS: carrying, lifting, transfers, bed mobility, bathing, toileting, dressing, self feeding, reach over head, and hygiene/grooming  PARTICIPATION LIMITATIONS: meal prep, cleaning, laundry, driving, shopping, community activity, and yard work  PERSONAL FACTORS: Age, Fitness, Time since onset of injury/illness/exacerbation, and 3+ comorbidities: see PMH above are also affecting patient's functional outcome.   REHAB POTENTIAL: Good  CLINICAL DECISION MAKING: Stable/uncomplicated  EVALUATION COMPLEXITY: Low  GOALS: Goals reviewed with patient? Yes  SHORT TERM GOALS: Target date: 08/12/2024    Patient will be independent and compliant with initial HEP.   Baseline: initial HEP issued  08/16/24: reports intermittent HEP adherence (PT on hold until today)  Goal status: MET  2.  Patient will demonstrate at least 120 degrees of Rt shoulder flexion PROM to facilitate progression towards AROM activity.  Baseline: see above 08/16/24: see ROM chart above Goal status: MET  3.  Patient will demonstrate at least 30 degrees of Rt shoulder ER PROM to facilitate progression towards AROM activity.  Baseline: see above  08/16/24: see ROM chart above Goal status: PROGRESSING   LONG TERM GOALS: Target date: 11/19/24    Patient will demonstrate at least 140 degrees of shoulder flexion AROM without shrug to improve ability to reach into cabinets.  Baseline: AROM deferred  08/16/24: see ROM chart above Goal status: PROGRESSING  2.  Patient will be able to complete sewing activity with minimal/no Rt shoulder pain.  Baseline: unable   08/16/24: not currently sewing, as expected 09/22/24: not currently sewing Goal status: ONGOING  3.  Patient will demonstrate at least 45 degrees of Rt shoulder ER AROM to improve ability to complete self-care activities.  Baseline: deferred 08/16/24: see ROM chart above Goal status: PROGRESSING  4.  Patient will demonstrate at least 90 degrees of Rt shoulder abduction AROM to improve ability to complete reaching activities.  Baseline: deferred 08/16/24: see ROM chart above Goal status: PROGRESSING  5.  Patient will score </= 20 % disability on the QuickDASH (MCID is [id number]) to signify clinically meaningful improvement in functional abilities.   Baseline: see above 08/16/24: 52.3% Goal status: PROGRESSING   PLAN: PT FREQUENCY: 1x/week  PT DURATION: 8 weeks  PLANNED INTERVENTIONS: 97164- PT Re-evaluation, 97750- Physical Performance Testing, 97110-Therapeutic exercises, 97530- Therapeutic activity, [id number]- Neuromuscular re-education, 97535- Self Care, [id number]- Manual therapy, 97016- Vasopneumatic device, 20560 (1-2 muscles), 20561 (3+ muscles)- Dry Needling, Cryotherapy, and Moist heat  PLAN FOR NEXT SESSION: progress per protocol to tolerance. MD f/u?   Kaiyden Simkin, PT, DPT, ATC 09/22/24 2:43 PM

## 2024-09-23 DIAGNOSIS — Z96611 Presence of right artificial shoulder joint: Secondary | ICD-10-CM | POA: Diagnosis not present

## 2024-09-23 DIAGNOSIS — Z471 Aftercare following joint replacement surgery: Secondary | ICD-10-CM | POA: Diagnosis not present

## 2024-09-27 ENCOUNTER — Ambulatory Visit

## 2024-09-28 ENCOUNTER — Ambulatory Visit: Payer: Self-pay | Attending: Orthopaedic Surgery

## 2024-09-28 DIAGNOSIS — M6281 Muscle weakness (generalized): Secondary | ICD-10-CM | POA: Diagnosis not present

## 2024-09-28 DIAGNOSIS — R600 Localized edema: Secondary | ICD-10-CM | POA: Diagnosis not present

## 2024-09-28 DIAGNOSIS — M4723 Other spondylosis with radiculopathy, cervicothoracic region: Secondary | ICD-10-CM | POA: Diagnosis not present

## 2024-09-28 DIAGNOSIS — M546 Pain in thoracic spine: Secondary | ICD-10-CM | POA: Diagnosis not present

## 2024-09-28 DIAGNOSIS — M9902 Segmental and somatic dysfunction of thoracic region: Secondary | ICD-10-CM | POA: Diagnosis not present

## 2024-09-28 DIAGNOSIS — M25511 Pain in right shoulder: Secondary | ICD-10-CM | POA: Insufficient documentation

## 2024-09-28 DIAGNOSIS — M25611 Stiffness of right shoulder, not elsewhere classified: Secondary | ICD-10-CM | POA: Insufficient documentation

## 2024-09-28 DIAGNOSIS — M9901 Segmental and somatic dysfunction of cervical region: Secondary | ICD-10-CM | POA: Diagnosis not present

## 2024-09-28 NOTE — Therapy (Signed)
 OUTPATIENT PHYSICAL THERAPY TREATMENT    Patient Name: Melissa Rodgers MRN: [id number] DOB:[birth year], 73 y.o., female Today's Date: 09/28/2024       END OF SESSION:  PT End of Session - 09/28/24 0932     Visit Number 17    Number of Visits 24    Date for Recertification  11/19/24    Authorization Type MCR    Progress Note Due on Visit 19    PT Start Time 0932    PT Stop Time 1013    PT Time Calculation (min) 41 min    Activity Tolerance Patient tolerated treatment well    Behavior During Therapy [facility] for tasks assessed/performed                          Past Medical History:  Diagnosis Date   Allergy    seasonal   Cataract 2024   Chronic bilateral low back pain without sciatica 10/07/2016   DDD (degenerative disc disease), cervical 12/03/2020   Diverticulitis of sigmoid colon 08/01/2019   Dysrhythmia    Afib   Essential hypertension 09/09/2018   Family history of diabetes mellitus 06/09/2022   Fibromyalgia    Gastroesophageal reflux disease without esophagitis 11/06/2015   Generalized anxiety disorder with panic attacks 08/01/2019   Hyperlipidemia 09/09/2018   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   Lumbosacral spondylosis 10/07/2016   Mass of lower inner quadrant of right breast 07/11/2022   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 Onetha Larger, [facility]. polyp 2 mm in the sigmoid colon, ( Polypectomy). Moderate diverticulosis of the descending colon and sigmoid colon. Lipoma in the ascending colon. Internal hemorrhoids.   ESOPHAGOGASTRODUODENOSCOPY     possibly last one was 2017. In [place]   HERNIA REPAIR     JOINT  REPLACEMENT     MEDIAL PARTIAL KNEE REPLACEMENT Right 12/14/2014   NISSEN FUNDOPLICATION  02/21/2016   OOPHORECTOMY     PARTIAL KNEE ARTHROPLASTY Left 04/05/2014   TOTAL KNEE REVISION Left 05/10/2021   Procedure: TOTAL KNEE REVISION;  Surgeon: Yvone Rush, MD;  Location: [facility];  Service: Orthopedics;  Laterality: Left;   TUBAL LIGATION     Patient Active Problem List   Diagnosis Date Noted   Mild coronary artery disease 05/04/2024   Preoperative cardiovascular examination 05/04/2024   Polyarthralgia with elevated ESR 03/15/2024   Rotator cuff tear, right 01/29/2024   Allergy    Mass of lower inner quadrant of right breast 07/11/2022   Family history of diabetes mellitus 06/09/2022   Paroxysmal atrial fibrillation (HCC) 07/15/2021   DDD (degenerative disc disease), cervical 12/03/2020   Fibromyalgia 03/05/2020   Diverticulitis of sigmoid colon 08/01/2019   Generalized anxiety disorder with panic attacks 08/01/2019   Non-seasonal allergic rhinitis 09/09/2018   Essential hypertension 09/09/2018   Mixed dyslipidemia 09/09/2018   Lumbosacral spondylosis 10/07/2016   Primary osteoarthritis of both knees post bilateral hemiarthroplasty 10/07/2016   Chronic bilateral low back pain without sciatica 10/07/2016   Atrial fibrillation 04/17/2016   S/P laparoscopic fundoplication 03/17/2016   Gastroesophageal reflux disease without esophagitis 11/06/2015   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    PCP: Willo Mini, NP  REFERRING PROVIDER: Cristy Bonner DASEN, MD  REFERRING DIAG: Right lateralized reverse total shoulder arthroplasty   THERAPY DIAG:  Acute pain of right shoulder  Stiffness of right shoulder, not elsewhere classified  Muscle weakness (generalized)  Localized edema  Rationale for Evaluation and Treatment: Rehabilitation  ONSET DATE: 06/29/24  SUBJECTIVE:                                                                                                                                                                                      Per eval: Patient underwent Rt lateralized RTSA surgery on 06/29/24. Right now the pain is pretty good since taking tylenol  and ibuprofen this morning. Pain has ebbed and flowed over the past 2 days. Does have some tingling in the thumb currently. All of her hobbies involve her arms including crafting and gardening. Wants to be able to use her sewing machine and is having difficulty with basic daily tasks right now.  Hand dominance: Left  SUBJECTIVE STATEMENT: Patient had f/u with Dr. Cristy who explained that ER ROM would take time due to surgical procedure. Patient reports overall the shoulder is doing well, but will still be fussy at times.    PERTINENT HISTORY: Fibromyalgia  A-fib Chronic low back pain   PAIN:  Are you having pain? Yes: NPRS scale: 3/10  Pain location: Rt anterior shoulder Pain description: sore Aggravating factors: reaching Relieving factors: medication  PRECAUTIONS: Other: see protocol for specific ROM and lifting restrictions  RED FLAGS: None   WEIGHT BEARING RESTRICTIONS: RUE WBAT  FALLS:  Has patient fallen in last 6 months? No  LIVING ENVIRONMENT: Lives with: lives with their spouse Lives in: House/apartment Stairs: Yes: External: 8 steps; can reach both Has following equipment at home: Quad cane small base  OCCUPATION: Retired   PLOF: Independent  PATIENT GOALS: I want to use my arm again.   NEXT MD VISIT: August 2026   OBJECTIVE:  Note: Objective measures were completed at Evaluation unless otherwise noted.  DIAGNOSTIC FINDINGS:  None on file   PATIENT SURVEYS :  Quick DASH: 81.8% disability   08/16/24 QuickDASH 52.3%   09/22/24: QuickDASH: 43% disability   COGNITION: Overall cognitive status: Within functional limits for tasks assessed     SENSATION: Not tested  POSTURE: Rt arm in sling, forward shoulders   UPPER EXTREMITY ROM:   Passive ROM Right eval Left eval  07/08/24 Right  PROM 07/13/24 Right PROM 07/20/24 R PROM 07/29/24 PROM Right  08/16/24 Right PROM 08/30/24 09/08/24 09/22/24 Rt   Shoulder flexion 30   50 89  88 98 deg s 125 AROM supine scapular plane 125 AROM supine scapular plane 145 AROM supine scapular plane; 120 AROM in  standing  Shoulder extension            Shoulder abduction          120 AROM supine; 80 AROM in standing  Shoulder adduction            Shoulder internal rotation            Shoulder external rotation 0    12 10 deg 0 pain  15 deg s 0 AROM/PROM; supine pain   0 supine AROM and PROM pain  Elbow flexion            Elbow extension Full            Wrist flexion            Wrist extension            Wrist ulnar deviation            Wrist radial deviation            Wrist pronation            Wrist supination            (Blank rows = not tested)  UPPER EXTREMITY MMT: deferred on eval due to post-op acuity   MMT Right eval Left eval  Shoulder flexion    Shoulder extension    Shoulder abduction    Shoulder adduction    Shoulder internal rotation    Shoulder external rotation    Middle trapezius    Lower trapezius    Elbow flexion    Elbow extension    Wrist flexion    Wrist extension    Wrist ulnar deviation    Wrist radial deviation    Wrist pronation    Wrist supination    Grip strength (lbs)    (Blank rows = not tested)    PALPATION:  Not assessed  [facility] Adult PT Treatment:                                                DATE: 09/28/24 Therapeutic Exercise: Doorway ER stretch x 20 sec Reviewed and updated HEP  Neuromuscular re-ed: Reactive isometrics ER/IR yellow band x 10 each  Resisted shoulder extension red band 2 x 10  Standing shoulder abduction AROM 2 x 5; mirror  Therapeutic Activity: Pulleys flexion and scaption x 2 minutes each  Finger ladder flexion x 5  Functional IR reach x 10  Standing shoulder flexion 2 x 5 @ 1 lb; mirror    [facility] Adult PT Treatment:                                                 DATE: 09/22/24 Therapeutic Exercise: HEP update  Bicep curl 1 lb 2 x 5   Neuromuscular re-ed: Standing shoulder extension with scapular retraction 2 x 10  Rows 2 x 10 red band  Standing shoulder abduction AROM at mirror x 10  Therapeutic Activity: Re-assessment to determine overall progress, educating patient on progress towards goals  Functional IR reach RUE x 10  Inclined shoulder flexion x 10     [facility] Adult PT Treatment:  DATE: 09/15/24 Therapeutic Exercise: Sidelying shoulder abduction 2 x 10  Standing bicep curl yellow band 2 x 10  HEP update  Manual Therapy: STM Rt bicep, deltoid, triceps   Therapeutic Activity: Pulleys flexion/scaption x 2 minutes each  Supine shoulder flexion AROM x 10  Seated shoulder flexion AROM 2 x 8  Seated shoulder abduction elbow bent x 5     [facility] Adult PT Treatment:                                                DATE: 09/08/24 Therapeutic Exercise: Standing shoulder flexion, abduction, ER AAROM with dowel x 10 each  Tricep extension yellow band 2 x 10  Reviewed and updated HEP   Therapeutic Activity: Flexion and scaption wall slide d/c due to pain Supine shoulder flexion AROM x 10  Standing shoulder abduction AROM elbow bent 45 degrees x 5      PATIENT EDUCATION: Education details: HEP update Person educated: Patient Education method: Explanation, Demonstration, Tactile cues, and Verbal cues,  Education comprehension: verbalized understanding, returned demonstration, verbal cues required, tactile cues required, and needs further education  HOME EXERCISE PROGRAM: Access Code: [facility] URL: [email or web address] Date: 09/28/2024 Prepared by: Lucie Meeter  Exercises - Standing Shoulder External Rotation Stretch in Doorway  - 1 x daily - 7 x weekly - 3 sets - 20 sec  hold - Seated Shoulder Flexion Towel Slide at Table Top  - 1 x daily - 7 x weekly - 1  sets - 5 reps - Seated Shoulder Scaption Slide at Table Top with Forearm in Neutral  - 1 x daily - 7 x weekly - 1 sets - 5 reps - Shoulder Flexion Overhead with Dowel  - 1 x daily - 7 x weekly - 1 sets - 10 reps - Standing Shoulder Abduction AAROM with Dowel  - 1 x daily - 7 x weekly - 1 sets - 10 reps - Standing Shoulder External Rotation AAROM with Dowel  - 1 x daily - 7 x weekly - 1 sets - 10 reps - Standing Shoulder Extension  - 1 x daily - 7 x weekly - 2 sets - 10 reps - Shoulder Abduction - Thumbs Up  - 1 x daily - 7 x weekly - 2 sets - 5 reps - Standing Elbow Extension with Self-Anchored Resistance  - 1 x daily - 3 x weekly - 2 sets - 10 reps - Standing Single Arm Elbow Flexion with Resistance  - 1 x daily - 3 x weekly - 2 sets - 10 reps - Standing Shoulder Row with Anchored Resistance  - 1 x daily - 3 x weekly - 2 sets - 10 reps - Shoulder External Rotation Reactive Isometrics  - 1 x daily - 3 x weekly - 1 sets - 10 reps - Shoulder Internal Rotation Reactive Isometrics  - 1 x daily - 3 x weekly - 1 sets - 10 reps - Standing Shoulder Flexion to 90 Degrees with Dumbbells  - 1 x daily - 3 x weekly - 2 sets - 5 reps  ASSESSMENT:  CLINICAL IMPRESSION: Continued with Rt shoulder ROM and strength progression with good tolerance. Introduced rotator cuff reactive isometrics with patient quickly fatiguing, but no onset of pain. With reactive ER isometric she is unable to keep shoulder in neutral secondary to weakness, so maintains IR positioning. Introduced functional  IR reach with ability to tolerate gentle range without significant discomfort. Introduced light resistance with standing shoulder flexion requiring use of mirror for visual feedback to reduce compensatory shrug.   Per eval: Patient is a 72 y.o. female who was seen today for physical therapy evaluation and treatment for s/p Rt lateralized RTSA on 06/29/24. She demonstrates Rt shoulder ROM and strength deficits that are consistent with her  recent post-operative status. She will benefit from skilled PT to assist in restoring her RUE strength and ROM in order to optimize her function and assist in overall pain reduction.    OBJECTIVE IMPAIRMENTS: decreased activity tolerance, decreased knowledge of condition, decreased knowledge of use of DME, decreased ROM, decreased strength, increased edema, impaired UE functional use, postural dysfunction, and pain.   ACTIVITY LIMITATIONS: carrying, lifting, transfers, bed mobility, bathing, toileting, dressing, self feeding, reach over head, and hygiene/grooming  PARTICIPATION LIMITATIONS: meal prep, cleaning, laundry, driving, shopping, community activity, and yard work  PERSONAL FACTORS: Age, Fitness, Time since onset of injury/illness/exacerbation, and 3+ comorbidities: see PMH above are also affecting patient's functional outcome.   REHAB POTENTIAL: Good  CLINICAL DECISION MAKING: Stable/uncomplicated  EVALUATION COMPLEXITY: Low  GOALS: Goals reviewed with patient? Yes  SHORT TERM GOALS: Target date: 08/12/2024    Patient will be independent and compliant with initial HEP.   Baseline: initial HEP issued  08/16/24: reports intermittent HEP adherence (PT on hold until today)  Goal status: MET  2.  Patient will demonstrate at least 120 degrees of Rt shoulder flexion PROM to facilitate progression towards AROM activity.  Baseline: see above 08/16/24: see ROM chart above Goal status: MET  3.  Patient will demonstrate at least 30 degrees of Rt shoulder ER PROM to facilitate progression towards AROM activity.  Baseline: see above  08/16/24: see ROM chart above Goal status: PROGRESSING   LONG TERM GOALS: Target date: 11/19/24    Patient will demonstrate at least 140 degrees of shoulder flexion AROM without shrug to improve ability to reach into cabinets.  Baseline: AROM deferred  08/16/24: see ROM chart above Goal status: PROGRESSING  2.  Patient will be able to complete  sewing activity with minimal/no Rt shoulder pain.  Baseline: unable  08/16/24: not currently sewing, as expected 09/22/24: not currently sewing Goal status: ONGOING  3.  Patient will demonstrate at least 45 degrees of Rt shoulder ER AROM to improve ability to complete self-care activities.  Baseline: deferred 08/16/24: see ROM chart above Goal status: PROGRESSING  4.  Patient will demonstrate at least 90 degrees of Rt shoulder abduction AROM to improve ability to complete reaching activities.  Baseline: deferred 08/16/24: see ROM chart above Goal status: PROGRESSING  5.  Patient will score </= 20 % disability on the QuickDASH (MCID is [id number]) to signify clinically meaningful improvement in functional abilities.   Baseline: see above 08/16/24: 52.3% Goal status: PROGRESSING   PLAN: PT FREQUENCY: 1x/week  PT DURATION: 8 weeks  PLANNED INTERVENTIONS: 97164- PT Re-evaluation, 97750- Physical Performance Testing, 97110-Therapeutic exercises, 97530- Therapeutic activity, [id number]- Neuromuscular re-education, 97535- Self Care, [id number]- Manual therapy, 97016- Vasopneumatic device, 20560 (1-2 muscles), 20561 (3+ muscles)- Dry Needling, Cryotherapy, and Moist heat  PLAN FOR NEXT SESSION: progress per protocol to tolerance.   Sofia Vanmeter, PT, DPT, ATC 09/28/24 10:18 AM

## 2024-09-29 ENCOUNTER — Encounter: Payer: Self-pay | Admitting: Medical-Surgical

## 2024-09-29 DIAGNOSIS — M47817 Spondylosis without myelopathy or radiculopathy, lumbosacral region: Secondary | ICD-10-CM

## 2024-09-29 MED ORDER — HYDROCODONE-ACETAMINOPHEN 5-325 MG PO TABS: 1.0000 | ORAL_TABLET | Freq: Three times a day (TID) | ORAL | 0 refills | Status: DC | PRN

## 2024-10-04 ENCOUNTER — Ambulatory Visit: Admitting: Cardiology

## 2024-10-05 ENCOUNTER — Ambulatory Visit

## 2024-10-05 DIAGNOSIS — M25511 Pain in right shoulder: Secondary | ICD-10-CM | POA: Diagnosis not present

## 2024-10-05 DIAGNOSIS — M6281 Muscle weakness (generalized): Secondary | ICD-10-CM | POA: Diagnosis not present

## 2024-10-05 DIAGNOSIS — R600 Localized edema: Secondary | ICD-10-CM

## 2024-10-05 DIAGNOSIS — M25611 Stiffness of right shoulder, not elsewhere classified: Secondary | ICD-10-CM | POA: Diagnosis not present

## 2024-10-05 NOTE — Therapy (Signed)
 OUTPATIENT PHYSICAL THERAPY TREATMENT    Patient Name: Melissa Rodgers MRN: [id number] DOB:08-26-1951, 73 y.o., female Today's Date: 10/05/2024       END OF SESSION:  PT End of Session - 10/05/24 1405     Visit Number 18    Number of Visits 24    Date for Recertification  11/19/24    Authorization Type MCR    Progress Note Due on Visit 19    PT Start Time 1403    PT Stop Time 1442    PT Time Calculation (min) 39 min    Activity Tolerance Patient tolerated treatment well    Behavior During Therapy [place] for tasks assessed/performed                          Past Medical History:  Diagnosis Date   Allergy    seasonal   Cataract 2024   Chronic bilateral low back pain without sciatica 10/07/2016   DDD (degenerative disc disease), cervical 12/03/2020   Diverticulitis of sigmoid colon 08/01/2019   Dysrhythmia    Afib   Essential hypertension 09/09/2018   Family history of diabetes mellitus 06/09/2022   Fibromyalgia    Gastroesophageal reflux disease without esophagitis 11/06/2015   Generalized anxiety disorder with panic attacks 08/01/2019   Hyperlipidemia 09/09/2018   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   Lumbosacral spondylosis 10/07/2016   Mass of lower inner quadrant of right breast 07/11/2022   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 Onetha Larger, [facility]. polyp 2 mm in the sigmoid colon, ( Polypectomy). Moderate diverticulosis of the descending colon and sigmoid colon. Lipoma in the ascending colon. Internal hemorrhoids.   ESOPHAGOGASTRODUODENOSCOPY     possibly last one was 2017. In [place]   HERNIA REPAIR     JOINT  REPLACEMENT     MEDIAL PARTIAL KNEE REPLACEMENT Right 12/14/2014   NISSEN FUNDOPLICATION  02/21/2016   OOPHORECTOMY     PARTIAL KNEE ARTHROPLASTY Left 04/05/2014   TOTAL KNEE REVISION Left 05/10/2021   Procedure: TOTAL KNEE REVISION;  Surgeon: Yvone Rush, MD;  Location: [facility];  Service: Orthopedics;  Laterality: Left;   TUBAL LIGATION     Patient Active Problem List   Diagnosis Date Noted   Mild coronary artery disease 05/04/2024   Preoperative cardiovascular examination 05/04/2024   Polyarthralgia with elevated ESR 03/15/2024   Rotator cuff tear, right 01/29/2024   Allergy    Mass of lower inner quadrant of right breast 07/11/2022   Family history of diabetes mellitus 06/09/2022   Paroxysmal atrial fibrillation (HCC) 07/15/2021   DDD (degenerative disc disease), cervical 12/03/2020   Fibromyalgia 03/05/2020   Diverticulitis of sigmoid colon 08/01/2019   Generalized anxiety disorder with panic attacks 08/01/2019   Non-seasonal allergic rhinitis 09/09/2018   Essential hypertension 09/09/2018   Mixed dyslipidemia 09/09/2018   Lumbosacral spondylosis 10/07/2016   Primary osteoarthritis of both knees post bilateral hemiarthroplasty 10/07/2016   Chronic bilateral low back pain without sciatica 10/07/2016   Atrial fibrillation 04/17/2016   S/P laparoscopic fundoplication 03/17/2016   Gastroesophageal reflux disease without esophagitis 11/06/2015   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    PCP: Willo Mini, NP  REFERRING PROVIDER: Cristy Bonner DASEN, MD  REFERRING DIAG: Right lateralized reverse total shoulder arthroplasty   THERAPY DIAG:  Acute pain of right shoulder  Stiffness of right shoulder, not elsewhere classified  Muscle weakness (generalized)  Localized edema  Rationale for Evaluation and Treatment: Rehabilitation  ONSET DATE: 06/29/24  SUBJECTIVE:                                                                                                                                                                                      Per eval: Patient underwent Rt lateralized RTSA surgery on 06/29/24. Right now the pain is pretty good since taking tylenol  and ibuprofen this morning. Pain has ebbed and flowed over the past 2 days. Does have some tingling in the thumb currently. All of her hobbies involve her arms including crafting and gardening. Wants to be able to use her sewing machine and is having difficulty with basic daily tasks right now.  Hand dominance: Left  SUBJECTIVE STATEMENT: Not too bad.    PERTINENT HISTORY: Fibromyalgia  A-fib Chronic low back pain   PAIN:  Are you having pain? Yes: NPRS scale: 2/10  Pain location: Rt anterior shoulder Pain description: sore Aggravating factors: reaching Relieving factors: medication  PRECAUTIONS: Other: see protocol for specific ROM and lifting restrictions  RED FLAGS: None   WEIGHT BEARING RESTRICTIONS: RUE WBAT  FALLS:  Has patient fallen in last 6 months? No  LIVING ENVIRONMENT: Lives with: lives with their spouse Lives in: House/apartment Stairs: Yes: External: 8 steps; can reach both Has following equipment at home: Quad cane small base  OCCUPATION: Retired   PLOF: Independent  PATIENT GOALS: I want to use my arm again.   NEXT MD VISIT: August 2026   OBJECTIVE:  Note: Objective measures were completed at Evaluation unless otherwise noted.  DIAGNOSTIC FINDINGS:  None on file   PATIENT SURVEYS :  Quick DASH: 81.8% disability   08/16/24 QuickDASH 52.3%   09/22/24: QuickDASH: 43% disability   COGNITION: Overall cognitive status: Within functional limits for tasks assessed     SENSATION: Not tested  POSTURE: Rt arm in sling, forward shoulders   UPPER EXTREMITY ROM:   Passive ROM Right eval Left eval 07/08/24 Right  PROM 07/13/24 Right PROM 07/20/24 R PROM 07/29/24 PROM Right  08/16/24 Right PROM 08/30/24 09/08/24 09/22/24 Rt   Shoulder flexion 30   50 89  88 98 deg s 125  AROM supine scapular plane 125 AROM supine scapular plane 145 AROM supine scapular plane; 120 AROM in standing  Shoulder extension            Shoulder abduction          120 AROM supine; 80  AROM in standing  Shoulder adduction            Shoulder internal rotation            Shoulder external rotation 0    12 10 deg 0 pain  15 deg s 0 AROM/PROM; supine pain   0 supine AROM and PROM pain  Elbow flexion            Elbow extension Full            Wrist flexion            Wrist extension            Wrist ulnar deviation            Wrist radial deviation            Wrist pronation            Wrist supination            (Blank rows = not tested)  UPPER EXTREMITY MMT: deferred on eval due to post-op acuity   MMT Right eval Left eval  Shoulder flexion    Shoulder extension    Shoulder abduction    Shoulder adduction    Shoulder internal rotation    Shoulder external rotation    Middle trapezius    Lower trapezius    Elbow flexion    Elbow extension    Wrist flexion    Wrist extension    Wrist ulnar deviation    Wrist radial deviation    Wrist pronation    Wrist supination    Grip strength (lbs)    (Blank rows = not tested)    PALPATION:  Not assessed  [facility] Adult PT Treatment:                                                DATE: 10/05/24 Therapeutic Exercise: Reviewed and updated HEP demo and returning demo as needed Concentration curl 2 x 10 @ 2 lbs; in standing has difficulty  Neuromuscular re-ed: Shoulder wall taps 2 x 10  Scapular retraction at wall x 10  Therapeutic Activity: Pulleys flexion and scaption x 2 minutes each  Functional IR reach with strap x 10     [facility] Adult PT Treatment:                                                DATE: 09/28/24 Therapeutic Exercise: Doorway ER stretch x 20 sec Reviewed and updated HEP  Neuromuscular re-ed: Reactive isometrics ER/IR yellow band x 10 each  Resisted shoulder extension red band 2 x 10  Standing shoulder  abduction AROM 2 x 5; mirror  Therapeutic Activity: Pulleys flexion and scaption x 2 minutes each  Finger ladder flexion x 5  Functional IR reach x 10  Standing shoulder flexion 2 x 5 @ 1 lb; mirror    [facility] Adult PT Treatment:                                                DATE: 09/22/24 Therapeutic Exercise: HEP update  Bicep curl 1 lb 2 x 5   Neuromuscular re-ed: Standing shoulder extension with scapular retraction 2 x 10  Rows 2 x 10 red band  Standing shoulder abduction AROM at mirror x 10  Therapeutic Activity: Re-assessment to determine overall progress, educating patient on progress towards goals  Functional IR reach RUE x 10  Inclined shoulder flexion x 10     [facility] Adult PT Treatment:                                                DATE: 09/15/24 Therapeutic Exercise: Sidelying shoulder abduction 2 x 10  Standing bicep curl yellow band 2 x 10  HEP update  Manual Therapy: STM Rt bicep, deltoid, triceps   Therapeutic Activity: Pulleys flexion/scaption x 2 minutes each  Supine shoulder flexion AROM x 10  Seated shoulder flexion AROM 2 x 8  Seated shoulder abduction elbow bent x 5      PATIENT EDUCATION: Education details: HEP update Person educated: Patient Education method: Explanation, Demonstration, Tactile cues, and Verbal cues,  Education comprehension: verbalized understanding, returned demonstration, verbal cues required, tactile cues required, and needs further education  HOME EXERCISE PROGRAM: Access Code: [facility] URL: [email or web address] Date: 10/05/2024 Prepared by: Lucie Meeter  Exercises - Standing Shoulder Internal Rotation Stretch with Towel  - 1 x daily - 7 x weekly - 1 sets - 10 reps - 5 sec  hold - Shoulder Taps on Table  - 1 x daily - 7 x weekly - 2 sets - 10 reps - Standing Shoulder External Rotation Stretch in Doorway  - 1 x daily - 7 x weekly - 3 sets - 20 sec  hold - Seated Shoulder Flexion Towel Slide at Table Top   - 1 x daily - 7 x weekly - 1 sets - 5 reps - Seated Shoulder Scaption Slide at Table Top with Forearm in Neutral  - 1 x daily - 7 x weekly - 1 sets - 5 reps - Shoulder Flexion Overhead with Dowel  - 1 x daily - 7 x weekly - 1 sets - 10 reps - Standing Shoulder Abduction AAROM with Dowel  - 1 x daily - 7 x weekly - 1 sets - 10 reps - Standing Shoulder External Rotation AAROM with Dowel  - 1 x daily - 7 x weekly - 1 sets - 10 reps - Standing Shoulder Extension  - 1 x daily - 7 x weekly - 2 sets - 10 reps - Shoulder Abduction - Thumbs Up  - 1 x daily - 7 x weekly - 2 sets - 5 reps - Standing Elbow Extension with Self-Anchored Resistance  - 1 x daily - 3 x weekly - 2 sets - 10 reps - Standing Shoulder Row with Anchored Resistance  - 1 x daily - 3 x weekly - 2 sets - 10 reps - Shoulder External Rotation Reactive Isometrics  - 1 x daily - 3 x weekly - 1 sets - 10 reps - Shoulder Internal Rotation Reactive Isometrics  - 1 x daily - 3 x weekly - 1 sets - 10 reps - Standing Shoulder Flexion to 90 Degrees with Dumbbells  - 1 x daily - 3 x weekly - 2 sets - 5 reps - Seated Single Arm Bicep Curls Supinated with Dumbbell  - 1 x daily - 3 x weekly - 2 sets -  10 reps  ASSESSMENT:  CLINICAL IMPRESSION: Introduced UE CKC strengthening today with patient noting fatigue, but no onset of shoulder pain. Towards end of wall scapular retraction her wrist became sore, so with modified hand positioning she is able to complete. With bicep curls she has difficulty performing in standing, but when modified to concentration curl in sitting she is better able to perform suggestive of shoulder positioning as a factor in performance. She reported that her shoulder felt good at conclusion of session.   Per eval: Patient is a 73 y.o. female who was seen today for physical therapy evaluation and treatment for s/p Rt lateralized RTSA on 06/29/24. She demonstrates Rt shoulder ROM and strength deficits that are consistent with her  recent post-operative status. She will benefit from skilled PT to assist in restoring her RUE strength and ROM in order to optimize her function and assist in overall pain reduction.    OBJECTIVE IMPAIRMENTS: decreased activity tolerance, decreased knowledge of condition, decreased knowledge of use of DME, decreased ROM, decreased strength, increased edema, impaired UE functional use, postural dysfunction, and pain.   ACTIVITY LIMITATIONS: carrying, lifting, transfers, bed mobility, bathing, toileting, dressing, self feeding, reach over head, and hygiene/grooming  PARTICIPATION LIMITATIONS: meal prep, cleaning, laundry, driving, shopping, community activity, and yard work  PERSONAL FACTORS: Age, Fitness, Time since onset of injury/illness/exacerbation, and 3+ comorbidities: see PMH above are also affecting patient's functional outcome.   REHAB POTENTIAL: Good  CLINICAL DECISION MAKING: Stable/uncomplicated  EVALUATION COMPLEXITY: Low  GOALS: Goals reviewed with patient? Yes  SHORT TERM GOALS: Target date: 08/12/2024    Patient will be independent and compliant with initial HEP.   Baseline: initial HEP issued  08/16/24: reports intermittent HEP adherence (PT on hold until today)  Goal status: MET  2.  Patient will demonstrate at least 120 degrees of Rt shoulder flexion PROM to facilitate progression towards AROM activity.  Baseline: see above 08/16/24: see ROM chart above Goal status: MET  3.  Patient will demonstrate at least 30 degrees of Rt shoulder ER PROM to facilitate progression towards AROM activity.  Baseline: see above  08/16/24: see ROM chart above Goal status: PROGRESSING   LONG TERM GOALS: Target date: 11/19/24    Patient will demonstrate at least 140 degrees of shoulder flexion AROM without shrug to improve ability to reach into cabinets.  Baseline: AROM deferred  08/16/24: see ROM chart above Goal status: PROGRESSING  2.  Patient will be able to complete  sewing activity with minimal/no Rt shoulder pain.  Baseline: unable  08/16/24: not currently sewing, as expected 09/22/24: not currently sewing Goal status: ONGOING  3.  Patient will demonstrate at least 45 degrees of Rt shoulder ER AROM to improve ability to complete self-care activities.  Baseline: deferred 08/16/24: see ROM chart above Goal status: PROGRESSING  4.  Patient will demonstrate at least 90 degrees of Rt shoulder abduction AROM to improve ability to complete reaching activities.  Baseline: deferred 08/16/24: see ROM chart above Goal status: PROGRESSING  5.  Patient will score </= 20 % disability on the QuickDASH (MCID is [id number]) to signify clinically meaningful improvement in functional abilities.   Baseline: see above 08/16/24: 52.3% Goal status: PROGRESSING   PLAN: PT FREQUENCY: 1x/week  PT DURATION: 8 weeks  PLANNED INTERVENTIONS: 97164- PT Re-evaluation, 97750- Physical Performance Testing, 97110-Therapeutic exercises, 97530- Therapeutic activity, [id number]- Neuromuscular re-education, 97535- Self Care, [id number]- Manual therapy, 97016- Vasopneumatic device, 79439 (1-2 muscles), 20561 (3+ muscles)- Dry Needling, Cryotherapy, and Moist heat  PLAN FOR NEXT SESSION: progress per protocol to tolerance. Focus on functional strengthening.    Hyla Coard, PT, DPT, ATC 10/05/24 2:46 PM

## 2024-10-09 ENCOUNTER — Other Ambulatory Visit: Payer: Self-pay | Admitting: Cardiology

## 2024-10-12 ENCOUNTER — Other Ambulatory Visit: Payer: Self-pay | Admitting: Cardiology

## 2024-10-12 ENCOUNTER — Ambulatory Visit

## 2024-10-12 DIAGNOSIS — M6281 Muscle weakness (generalized): Secondary | ICD-10-CM

## 2024-10-12 DIAGNOSIS — M25511 Pain in right shoulder: Secondary | ICD-10-CM

## 2024-10-12 DIAGNOSIS — M25611 Stiffness of right shoulder, not elsewhere classified: Secondary | ICD-10-CM | POA: Diagnosis not present

## 2024-10-12 DIAGNOSIS — H269 Unspecified cataract: Secondary | ICD-10-CM | POA: Insufficient documentation

## 2024-10-12 DIAGNOSIS — R600 Localized edema: Secondary | ICD-10-CM

## 2024-10-12 NOTE — Telephone Encounter (Signed)
 Prescription refill request for Eliquis  received. Indication:afib Last office visit:6/25 Scr:0.57  4/25 Age: 73 Weight:81.2  kg  Prescription refilled

## 2024-10-12 NOTE — Therapy (Signed)
 OUTPATIENT PHYSICAL THERAPY TREATMENT Progress Note Reporting Period 08/16/24 to 10/12/24  See note below for Objective Data and Assessment of Progress/Goals.        Patient Name: Melissa Rodgers MRN: [id number] DOB:1951-06-05, 73 y.o., female Today's Date: 10/12/2024       END OF SESSION:  PT End of Session - 10/12/24 1400     Visit Number 19    Number of Visits 24    Date for Recertification  11/19/24    Authorization Type MCR    Progress Note Due on Visit 29    PT Start Time 1400    PT Stop Time 1443    PT Time Calculation (min) 43 min    Activity Tolerance Patient tolerated treatment well    Behavior During Therapy [facility] for tasks assessed/performed                           Past Medical History:  Diagnosis Date   Allergy    seasonal   Cataract 2024   Chronic bilateral low back pain without sciatica 10/07/2016   DDD (degenerative disc disease), cervical 12/03/2020   Diverticulitis of sigmoid colon 08/01/2019   Dysrhythmia    Afib   Essential hypertension 09/09/2018   Family history of diabetes mellitus 06/09/2022   Fibromyalgia    Gastroesophageal reflux disease without esophagitis 11/06/2015   Generalized anxiety disorder with panic attacks 08/01/2019   History of prosthetic unicompartmental arthroplasty of left knee 09/06/2019   History of prosthetic unicompartmental arthroplasty of right knee 09/06/2019   Hyperlipidemia 09/09/2018   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   Lumbosacral spondylosis 10/07/2016   Mass of lower inner quadrant of right breast 07/11/2022   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 Onetha Larger, [facility]. polyp 2 mm in the sigmoid colon, ( Polypectomy). Moderate diverticulosis of the descending colon and sigmoid colon. Lipoma in the ascending colon. Internal hemorrhoids.   ESOPHAGOGASTRODUODENOSCOPY     possibly last one was 2017. In [place]   HERNIA REPAIR     JOINT REPLACEMENT     MEDIAL PARTIAL KNEE REPLACEMENT Right 12/14/2014   NISSEN FUNDOPLICATION  02/21/2016   OOPHORECTOMY     PARTIAL KNEE ARTHROPLASTY Left 04/05/2014   TOTAL KNEE REVISION Left 05/10/2021   Procedure: TOTAL KNEE REVISION;  Surgeon: Yvone Rush, MD;  Location: [facility];  Service: Orthopedics;  Laterality: Left;   TUBAL LIGATION     Patient Active Problem List   Diagnosis Date Noted   Cataract    Mild coronary artery disease 05/04/2024   Preoperative cardiovascular examination 05/04/2024   Polyarthralgia with elevated ESR 03/15/2024   Rotator cuff tear, right 01/29/2024   Allergy    Mass of lower inner quadrant of right breast 07/11/2022   Family history of diabetes mellitus 06/09/2022   Paroxysmal atrial fibrillation (HCC) 07/15/2021   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/2021   DDD (degenerative disc disease), cervical 12/03/2020   Fibromyalgia  03/05/2020   History of prosthetic unicompartmental arthroplasty of left knee 09/06/2019   History of prosthetic unicompartmental arthroplasty of right knee 09/06/2019   Diverticulitis of sigmoid colon 08/01/2019   Generalized anxiety disorder with panic attacks 08/01/2019   Non-seasonal allergic rhinitis 09/09/2018   Essential hypertension 09/09/2018   Mixed dyslipidemia 09/09/2018   Hyperlipidemia 09/09/2018   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   Lumbosacral  spondylosis 10/07/2016   Primary osteoarthritis of both knees post bilateral hemiarthroplasty 10/07/2016   Chronic bilateral low back pain without sciatica 10/07/2016   Atrial fibrillation 04/17/2016   S/P laparoscopic fundoplication 03/17/2016   Gastroesophageal reflux disease without esophagitis 11/06/2015   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    PCP: Willo Mini, NP  REFERRING PROVIDER: Cristy Bonner DASEN, MD  REFERRING DIAG: Right lateralized reverse total shoulder arthroplasty   THERAPY DIAG:  Acute pain of right shoulder  Stiffness of right shoulder, not elsewhere classified  Muscle weakness (generalized)  Localized edema  Rationale for Evaluation and Treatment: Rehabilitation  ONSET DATE: 06/29/24  SUBJECTIVE:                                                                                                                                                                                     Per eval: Patient underwent Rt lateralized RTSA surgery on 06/29/24. Right now the pain is pretty good since taking tylenol  and ibuprofen this morning. Pain has ebbed and flowed over the past 2 days. Does have some tingling in the thumb currently. All of her hobbies involve her arms including crafting and gardening. Wants to be able to use her sewing machine and is having difficulty with basic daily tasks right now.  Hand dominance: Left  SUBJECTIVE STATEMENT: Patient reports the shoulder is feeling pretty good today. Was able to use her sewing machine since last session.    PERTINENT HISTORY: Fibromyalgia  A-fib Chronic low back pain   PAIN:  Are you having pain? Yes: NPRS scale: 2/10  Pain location: Rt anterior shoulder Pain description: sore Aggravating factors: reaching Relieving factors: medication  PRECAUTIONS: Other: see protocol for specific ROM and lifting restrictions  RED FLAGS: None   WEIGHT BEARING RESTRICTIONS: RUE WBAT  FALLS:  Has patient fallen in last 6 months?  No  LIVING ENVIRONMENT: Lives with: lives with their spouse Lives in: House/apartment Stairs: Yes: External: 8 steps; can reach both Has following equipment at home: Quad cane small base  OCCUPATION: Retired   PLOF: Independent  PATIENT GOALS: I want to use my arm again.   NEXT MD VISIT: August 2026   OBJECTIVE:  Note: Objective measures were completed at Evaluation unless otherwise noted.  DIAGNOSTIC FINDINGS:  None  on file   PATIENT SURVEYS :  Quick DASH: 81.8% disability   08/16/24 QuickDASH 52.3%   09/22/24: QuickDASH: 43% disability   10/12/24: QuickDASH: 25% disability   COGNITION: Overall cognitive status: Within functional limits for tasks assessed     SENSATION: Not tested  POSTURE: Rt arm in sling, forward shoulders   UPPER EXTREMITY ROM:   Passive ROM Right eval Left eval 07/08/24 Right  PROM 07/13/24 Right PROM 07/20/24 R PROM 07/29/24 PROM Right  08/16/24 Right PROM 08/30/24 09/08/24 09/22/24 Rt  10/12/24 Right AROM  Shoulder flexion 30   50 89  88 98 deg s 125 AROM supine scapular plane 125 AROM supine scapular plane 145 AROM supine scapular plane; 120 AROM in standing 130 standing  Shoulder extension             Shoulder abduction          120 AROM supine; 80 AROM in standing 80 standing  Shoulder adduction             Shoulder internal rotation             Shoulder external rotation 0    12 10 deg 0 pain  15 deg s 0 AROM/PROM; supine pain   0 supine AROM and PROM pain 15 AROM in standing with arm at side  Elbow flexion             Elbow extension Full             Wrist flexion             Wrist extension             Wrist ulnar deviation             Wrist radial deviation             Wrist pronation             Wrist supination             (Blank rows = not tested)  UPPER EXTREMITY MMT: deferred on eval due to post-op acuity   MMT Right eval Left eval  Shoulder flexion    Shoulder extension    Shoulder abduction    Shoulder  adduction    Shoulder internal rotation    Shoulder external rotation    Middle trapezius    Lower trapezius    Elbow flexion    Elbow extension    Wrist flexion    Wrist extension    Wrist ulnar deviation    Wrist radial deviation    Wrist pronation    Wrist supination    Grip strength (lbs)    (Blank rows = not tested)    PALPATION:  Not assessed  [facility] Adult PT Treatment:                                                DATE: 10/12/24  Neuromuscular re-ed: Bilateral ER 2 x 10; attempted with yellow band unable on the RUE Standing resisted shoulder extension 2 x 10  Bent over row 2 x 10 @ 2 lbs  Eccentric shoulder flexion  x 5  Therapeutic Activity: Re-assessment to determine overall progress, educating patient on progress towards goals Pulley's flexion x 2 minutes  Finger ladder flexion and abduction x 5 each  Standing shoulder abduction AROM x  10  Functional IR reach with strap x 10 Shoulder ER AAROM with stool x 10  Curl to overhead lift x 10     [facility] Adult PT Treatment:                                                DATE: 10/05/24 Therapeutic Exercise: Reviewed and updated HEP demo and returning demo as needed Concentration curl 2 x 10 @ 2 lbs; in standing has difficulty  Neuromuscular re-ed: Shoulder wall taps 2 x 10  Scapular retraction at wall x 10  Therapeutic Activity: Pulleys flexion and scaption x 2 minutes each  Functional IR reach with strap x 10     [facility] Adult PT Treatment:                                                DATE: 09/28/24 Therapeutic Exercise: Doorway ER stretch x 20 sec Reviewed and updated HEP  Neuromuscular re-ed: Reactive isometrics ER/IR yellow band x 10 each  Resisted shoulder extension red band 2 x 10  Standing shoulder abduction AROM 2 x 5; mirror  Therapeutic Activity: Pulleys flexion and scaption x 2 minutes each  Finger ladder flexion x 5  Functional IR reach x 10  Standing shoulder flexion 2 x 5 @ 1 lb;  mirror    [facility] Adult PT Treatment:                                                DATE: 09/22/24 Therapeutic Exercise: HEP update  Bicep curl 1 lb 2 x 5   Neuromuscular re-ed: Standing shoulder extension with scapular retraction 2 x 10  Rows 2 x 10 red band  Standing shoulder abduction AROM at mirror x 10  Therapeutic Activity: Re-assessment to determine overall progress, educating patient on progress towards goals  Functional IR reach RUE x 10  Inclined shoulder flexion x 10     PATIENT EDUCATION: Education details: HEP review Person educated: Patient Education method: Explanation, Demonstration, Tactile cues, and Verbal cues,  Education comprehension: verbalized understanding, returned demonstration, verbal cues required, tactile cues required, and needs further education  HOME EXERCISE PROGRAM: Access Code: [facility] URL: [email or web address] Date: 10/05/2024 Prepared by: Lucie Meeter  Exercises - Standing Shoulder Internal Rotation Stretch with Towel  - 1 x daily - 7 x weekly - 1 sets - 10 reps - 5 sec  hold - Shoulder Taps on Table  - 1 x daily - 7 x weekly - 2 sets - 10 reps - Standing Shoulder External Rotation Stretch in Doorway  - 1 x daily - 7 x weekly - 3 sets - 20 sec  hold - Seated Shoulder Flexion Towel Slide at Table Top  - 1 x daily - 7 x weekly - 1 sets - 5 reps - Seated Shoulder Scaption Slide at Table Top with Forearm in Neutral  - 1 x daily - 7 x weekly - 1 sets - 5 reps - Shoulder Flexion Overhead with Dowel  - 1 x daily - 7 x weekly - 1 sets - 10 reps - Standing Shoulder  Abduction AAROM with Dowel  - 1 x daily - 7 x weekly - 1 sets - 10 reps - Standing Shoulder External Rotation AAROM with Dowel  - 1 x daily - 7 x weekly - 1 sets - 10 reps - Standing Shoulder Extension  - 1 x daily - 7 x weekly - 2 sets - 10 reps - Shoulder Abduction - Thumbs Up  - 1 x daily - 7 x weekly - 2 sets - 5 reps - Standing Elbow Extension with Self-Anchored  Resistance  - 1 x daily - 3 x weekly - 2 sets - 10 reps - Standing Shoulder Row with Anchored Resistance  - 1 x daily - 3 x weekly - 2 sets - 10 reps - Shoulder External Rotation Reactive Isometrics  - 1 x daily - 3 x weekly - 1 sets - 10 reps - Shoulder Internal Rotation Reactive Isometrics  - 1 x daily - 3 x weekly - 1 sets - 10 reps - Standing Shoulder Flexion to 90 Degrees with Dumbbells  - 1 x daily - 3 x weekly - 2 sets - 5 reps - Seated Single Arm Bicep Curls Supinated with Dumbbell  - 1 x daily - 3 x weekly - 2 sets - 10 reps  ASSESSMENT:  CLINICAL IMPRESSION: Yaslin is making slow, but steady progress in PT s/p Rt lateralized RTSA on 06/29/24. Her Rt shoulder AROM continues to gradually improve nearing functional range of flexion and abduction. ER AROM remains the most limited. She is tolerating gradual protocol progression well recently introduction functional IR mobility and UE CKC. She will benefit from continuing with current POC to address lingering Rt shoulder ROM and strength deficits in order to optimize functional use of the RUE.   Per eval: Patient is a 73 y.o. female who was seen today for physical therapy evaluation and treatment for s/p Rt lateralized RTSA on 06/29/24. She demonstrates Rt shoulder ROM and strength deficits that are consistent with her recent post-operative status. She will benefit from skilled PT to assist in restoring her RUE strength and ROM in order to optimize her function and assist in overall pain reduction.    OBJECTIVE IMPAIRMENTS: decreased activity tolerance, decreased knowledge of condition, decreased knowledge of use of DME, decreased ROM, decreased strength, increased edema, impaired UE functional use, postural dysfunction, and pain.   ACTIVITY LIMITATIONS: carrying, lifting, transfers, bed mobility, bathing, toileting, dressing, self feeding, reach over head, and hygiene/grooming  PARTICIPATION LIMITATIONS: meal prep, cleaning, laundry, driving,  shopping, community activity, and yard work  PERSONAL FACTORS: Age, Fitness, Time since onset of injury/illness/exacerbation, and 3+ comorbidities: see PMH above are also affecting patient's functional outcome.   REHAB POTENTIAL: Good  CLINICAL DECISION MAKING: Stable/uncomplicated  EVALUATION COMPLEXITY: Low  GOALS: Goals reviewed with patient? Yes  SHORT TERM GOALS: Target date: 08/12/2024    Patient will be independent and compliant with initial HEP.   Baseline: initial HEP issued  08/16/24: reports intermittent HEP adherence (PT on hold until today)  Goal status: MET  2.  Patient will demonstrate at least 120 degrees of Rt shoulder flexion PROM to facilitate progression towards AROM activity.  Baseline: see above 08/16/24: see ROM chart above Goal status: MET  3.  Patient will demonstrate at least 30 degrees of Rt shoulder ER PROM to facilitate progression towards AROM activity.  Baseline: see above  08/16/24: see ROM chart above Goal status: PROGRESSING   LONG TERM GOALS: Target date: 11/19/24    Patient will demonstrate at  least 140 degrees of shoulder flexion AROM without shrug to improve ability to reach into cabinets.  Baseline: AROM deferred  08/16/24: see ROM chart above Goal status: PROGRESSING  2.  Patient will be able to complete sewing activity with minimal/no Rt shoulder pain.  Baseline: unable  08/16/24: not currently sewing, as expected 09/22/24: not currently sewing 10/12/24: was able to sew reporting muscle soreness Goal status: MET  3.  Patient will demonstrate at least 45 degrees of Rt shoulder ER AROM to improve ability to complete self-care activities.  Baseline: deferred 08/16/24: see ROM chart above Goal status: PROGRESSING  4.  Patient will demonstrate at least 90 degrees of Rt shoulder abduction AROM to improve ability to complete reaching activities.  Baseline: deferred 08/16/24: see ROM chart above Goal status: PROGRESSING  5.  Patient  will score </= 20 % disability on the QuickDASH (MCID is 8-15.9) to signify clinically meaningful improvement in functional abilities.   Baseline: see above 08/16/24: 52.3% Goal status: PROGRESSING   PLAN: PT FREQUENCY: 1x/week  PT DURATION: 8 weeks  PLANNED INTERVENTIONS: 97164- PT Re-evaluation, 97750- Physical Performance Testing, 97110-Therapeutic exercises, 97530- Therapeutic activity, [id number]- Neuromuscular re-education, 97535- Self Care, [id number]- Manual therapy, 97016- Vasopneumatic device, 20560 (1-2 muscles), 20561 (3+ muscles)- Dry Needling, Cryotherapy, and Moist heat  PLAN FOR NEXT SESSION: progress per protocol to tolerance. Focus on functional strengthening.    Shaquinta Peruski, PT, DPT, ATC 10/12/24 2:44 PM

## 2024-10-13 NOTE — Progress Notes (Signed)
 - Cardiology Office Note:    Date:  10/17/2024   ID:  Melissa Rodgers, DOB 05-May-1951, MRN [id number]  PCP:  Willo Mini, NP  Cardiologist:  Redell Leiter, MD    Referring MD: Willo Mini, NP    ASSESSMENT:    1. Paroxysmal atrial fibrillation (HCC)   2. High risk medication use   3. Chronic anticoagulation   4. Essential hypertension   5. Mixed dyslipidemia   6. Agatston coronary artery calcium  score 0    PLAN:    In order of problems listed above:  [name] continues to do well no recurrence of atrial fibrillation tolerates low-dose flecainide  without side effects or toxicity and we will continue her current direct anticoagulant without bleeding complication Well-controlled hypertension with her beta blocker She has decided not to take lipid-lowering therapy with a near normal coronary calcium  score   Next appointment: 1 year she will continue self monitor with her Apple watch   Medication Adjustments/Labs and Tests Ordered: Current medicines are reviewed at length with the patient today.  Concerns regarding medicines are outlined above.  Orders Placed This Encounter  Procedures   EKG 12-Lead   No orders of the defined types were placed in this encounter.    History of Present Illness:    Melissa Rodgers is a 73 y.o. female with a hx of paroxysmal atrial fibrillation maintaining sinus rhythm with flecainide  therapy chronic anticoagulation hypertension and with minimal nonobstructive CAD on CTA coronary calcium  score of 1 last seen 09/16/2023.  Compliance with diet, lifestyle and medications: Yes  She had shoulder reversal surgery as an outpatient no cardiovascular complications but She tracks her blood pressure and she tends to run in the 125-135/70-75 range No palpitations syncope chest pain shortness of breath or edema She is had no bleeding complication of her anticoagulant She has decided not to take lipid-lowering therapy with a coronary calcium  score of near  0 Past Medical History:  Diagnosis Date   Allergy    seasonal   Cataract 2024   Chronic bilateral low back pain without sciatica 10/07/2016   DDD (degenerative disc disease), cervical 12/03/2020   Diverticulitis of sigmoid colon 08/01/2019   Dysrhythmia    Afib   Essential hypertension 09/09/2018   Family history of diabetes mellitus 06/09/2022   Fibromyalgia    Gastroesophageal reflux disease without esophagitis 11/06/2015   Generalized anxiety disorder with panic attacks 08/01/2019   History of prosthetic unicompartmental arthroplasty of left knee 09/06/2019   History of prosthetic unicompartmental arthroplasty of right knee 09/06/2019   Hyperlipidemia 09/09/2018   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   Lumbosacral spondylosis 10/07/2016   Mass of lower inner quadrant of right breast 07/11/2022   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/2021   Mild coronary artery disease 05/04/2024   Mixed dyslipidemia 09/09/2018   Non-seasonal allergic rhinitis 09/09/2018   Paroxysmal atrial fibrillation (HCC) 07/15/2021   Polyarthralgia with elevated ESR 03/15/2024   Preoperative cardiovascular examination 05/04/2024   Primary osteoarthritis of both knees post bilateral hemiarthroplasty 10/07/2016   Rotator cuff tear, right 01/29/2024   S/P laparoscopic fundoplication 03/17/2016   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    Current Medications: Current Meds  Medication Sig   Acetaminophen  (TYLENOL  PO) Take 2 tablets by mouth daily as needed (pain).   amoxicillin  (AMOXIL ) 500 MG tablet Take 2,000 mg by mouth See admin instructions. Take 2000 mg by mouth 1 hour before dental appointment   bismuth subsalicylate (PEPTO BISMOL) 262 MG/15ML suspension Take 15 mLs by  mouth as needed for indigestion.   calcium  carbonate (TUMS - DOSED IN MG ELEMENTAL CALCIUM ) 500 MG chewable tablet Chew 1,000 mg by mouth as needed for indigestion or heartburn.   cetirizine (ZYRTEC) 10 MG tablet Take 10  mg by mouth at bedtime.   clonazePAM  (KLONOPIN ) 0.5 MG tablet Take 0.5 tablets (0.25 mg total) by mouth 2 (two) times daily as needed for anxiety.   Cyanocobalamin (B-12 PO) Take 1 tablet by mouth 2 (two) times a week.   DIGESTIVE ENZYMES PO Take 1 capsule by mouth as needed.   ELIQUIS  5 MG TABS tablet TAKE 1 TABLET TWICE A DAY   flecainide  (TAMBOCOR ) 50 MG tablet TAKE 1 TABLET(50 MG) BY MOUTH TWICE DAILY   FLUoxetine  (PROZAC ) 20 MG tablet Take 1 tablet (20 mg total) by mouth daily.   gabapentin  (NEURONTIN ) 100 MG capsule Take 1-3 capsules (100-300 mg total) by mouth 3 (three) times daily as needed.   HYDROcodone -acetaminophen  (NORCO/VICODIN) 5-325 MG tablet Take 1 tablet by mouth every 8 (eight) hours as needed for moderate pain (pain score 4-6).   lovastatin  (MEVACOR ) 10 MG tablet Take 1 tablet (10 mg total) by mouth at bedtime.   metoprolol  succinate (TOPROL -XL) 25 MG 24 hr tablet Take 1 tablet (25 mg total) by mouth daily.   ondansetron  (ZOFRAN -ODT) 4 MG disintegrating tablet Take 1 tablet (4 mg total) by mouth every 6 (six) hours as needed for nausea or vomiting.   pantoprazole  (PROTONIX ) 40 MG tablet TAKE 1 TABLET(40 MG) BY MOUTH DAILY 30 MINUTES BEFORE BREAKFAST   Polyethyl Glycol-Propyl Glycol (SYSTANE OP) Place 1 drop into both eyes in the morning and at bedtime.   ZINC OXIDE, TOPICAL, EX Apply 1 Application topically as needed (rash under stomach).      EKGs/Labs/Other Studies Reviewed:    The following studies were reviewed today:        Her EKG shows sinus bradycardia 57 bpm no evidence of flecainide  toxicity Recent Labs: 12/11/2023: TSH 1.730 03/14/2024: ALT 18; BUN 13; Creatinine, Ser 0.57; Hemoglobin 13.9; Platelets 283; Potassium 4.1; Sodium 141  Recent Lipid Panel    Component Value Date/Time   CHOL 216 (H) 12/11/2023 1057   TRIG 335 (H) 12/11/2023 1057   HDL 42 12/11/2023 1057   CHOLHDL 5.1 (H) 12/11/2023 1057   CHOLHDL 4.8 12/18/2022 0839   LDLCALC 116 (H)  12/11/2023 1057   LDLCALC 140 (H) 12/18/2022 0839    Physical Exam:    VS:  BP 136/76   Pulse (!) 57   Ht 5' 3 (1.6 m)   Wt 177 lb 9.6 oz (80.6 kg)   SpO2 97%   BMI 31.46 kg/m     Wt Readings from Last 3 Encounters:  10/17/24 177 lb 9.6 oz (80.6 kg)  06/09/24 179 lb 1.9 oz (81.2 kg)  05/20/24 179 lb 1.9 oz (81.2 kg)     GEN:  Well nourished, well developed in no acute distress HEENT: Normal NECK: No JVD; No carotid bruits LYMPHATICS: No lymphadenopathy CARDIAC: RRR, no murmurs, rubs, gallops RESPIRATORY:  Clear to auscultation without rales, wheezing or rhonchi  ABDOMEN: Soft, non-tender, non-distended MUSCULOSKELETAL:  No edema; No deformity  SKIN: Warm and dry NEUROLOGIC:  Alert and oriented x 3 PSYCHIATRIC:  Normal affect    Signed, Redell Leiter, MD  10/17/2024 11:41 AM    [facility]

## 2024-10-17 ENCOUNTER — Encounter: Payer: Self-pay | Admitting: Cardiology

## 2024-10-17 ENCOUNTER — Ambulatory Visit: Attending: Cardiology | Admitting: Cardiology

## 2024-10-17 VITALS — BP 136/76 | HR 57 | Ht 63.0 in | Wt 177.6 lb

## 2024-10-17 DIAGNOSIS — I480 Paroxysmal atrial fibrillation: Secondary | ICD-10-CM | POA: Insufficient documentation

## 2024-10-17 DIAGNOSIS — E782 Mixed hyperlipidemia: Secondary | ICD-10-CM | POA: Insufficient documentation

## 2024-10-17 DIAGNOSIS — I10 Essential (primary) hypertension: Secondary | ICD-10-CM | POA: Diagnosis not present

## 2024-10-17 DIAGNOSIS — Z7901 Long term (current) use of anticoagulants: Secondary | ICD-10-CM | POA: Diagnosis not present

## 2024-10-17 DIAGNOSIS — R931 Abnormal findings on diagnostic imaging of heart and coronary circulation: Secondary | ICD-10-CM | POA: Insufficient documentation

## 2024-10-17 DIAGNOSIS — Z79899 Other long term (current) drug therapy: Secondary | ICD-10-CM | POA: Diagnosis not present

## 2024-10-17 NOTE — Patient Instructions (Signed)

## 2024-10-19 ENCOUNTER — Encounter: Payer: Self-pay | Admitting: Physical Therapy

## 2024-10-19 ENCOUNTER — Ambulatory Visit: Payer: Self-pay | Admitting: Physical Therapy

## 2024-10-19 DIAGNOSIS — M25611 Stiffness of right shoulder, not elsewhere classified: Secondary | ICD-10-CM | POA: Diagnosis not present

## 2024-10-19 DIAGNOSIS — M25511 Pain in right shoulder: Secondary | ICD-10-CM

## 2024-10-19 DIAGNOSIS — M6281 Muscle weakness (generalized): Secondary | ICD-10-CM | POA: Diagnosis not present

## 2024-10-19 DIAGNOSIS — R600 Localized edema: Secondary | ICD-10-CM | POA: Diagnosis not present

## 2024-10-19 NOTE — Therapy (Signed)
 OUTPATIENT PHYSICAL THERAPY TREATMENT    Patient Name: Melissa Rodgers MRN: [id number] DOB:1951-11-20, 73 y.o., female Today's Date: 10/19/2024       END OF SESSION:  PT End of Session - 10/19/24 1401     Visit Number 20    Number of Visits 24    Date for Recertification  11/19/24    Authorization Type MCR    Progress Note Due on Visit 29    PT Start Time 1401    PT Stop Time 1442    PT Time Calculation (min) 41 min                            Past Medical History:  Diagnosis Date   Allergy    seasonal   Cataract 2024   Chronic bilateral low back pain without sciatica 10/07/2016   DDD (degenerative disc disease), cervical 12/03/2020   Diverticulitis of sigmoid colon 08/01/2019   Dysrhythmia    Afib   Essential hypertension 09/09/2018   Family history of diabetes mellitus 06/09/2022   Fibromyalgia    Gastroesophageal reflux disease without esophagitis 11/06/2015   Generalized anxiety disorder with panic attacks 08/01/2019   History of prosthetic unicompartmental arthroplasty of left knee 09/06/2019   History of prosthetic unicompartmental arthroplasty of right knee 09/06/2019   Hyperlipidemia 09/09/2018   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   Lumbosacral spondylosis 10/07/2016   Mass of lower inner quadrant of right breast 07/11/2022   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 Onetha Larger, [facility]. polyp 2 mm in the sigmoid colon, ( Polypectomy). Moderate diverticulosis of the descending colon and sigmoid colon. Lipoma in the ascending colon. Internal hemorrhoids.   ESOPHAGOGASTRODUODENOSCOPY     possibly last one was 2017. In [place]   HERNIA REPAIR     JOINT REPLACEMENT     MEDIAL PARTIAL KNEE REPLACEMENT Right 12/14/2014   NISSEN FUNDOPLICATION  02/21/2016   OOPHORECTOMY     PARTIAL KNEE ARTHROPLASTY Left 04/05/2014   TOTAL KNEE REVISION Left 05/10/2021   Procedure: TOTAL KNEE REVISION;  Surgeon: Yvone Rush, MD;  Location: [facility];  Service: Orthopedics;  Laterality: Left;   TUBAL LIGATION     Patient Active Problem List   Diagnosis Date Noted   Cataract    Mild coronary artery disease 05/04/2024   Preoperative cardiovascular examination 05/04/2024   Polyarthralgia with elevated ESR 03/15/2024   Rotator cuff tear, right 01/29/2024   Allergy    Mass of lower inner quadrant of right breast 07/11/2022   Family history of diabetes mellitus 06/09/2022   Paroxysmal atrial fibrillation (HCC) 07/15/2021   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/2021   DDD (degenerative disc disease), cervical 12/03/2020   Fibromyalgia 03/05/2020   History of prosthetic unicompartmental arthroplasty of left knee 09/06/2019   History of prosthetic unicompartmental arthroplasty of right knee 09/06/2019   Diverticulitis of sigmoid colon 08/01/2019   Generalized anxiety disorder with panic attacks 08/01/2019  Non-seasonal allergic rhinitis 09/09/2018   Essential hypertension 09/09/2018   Mixed dyslipidemia 09/09/2018   Hyperlipidemia 09/09/2018   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   Lumbosacral spondylosis 10/07/2016   Primary osteoarthritis of both knees post bilateral hemiarthroplasty 10/07/2016   Chronic bilateral low back pain without sciatica 10/07/2016   Atrial fibrillation 04/17/2016   S/P laparoscopic fundoplication 03/17/2016    Gastroesophageal reflux disease without esophagitis 11/06/2015   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    PCP: Willo Mini, NP  REFERRING PROVIDER: Cristy Bonner DASEN, MD  REFERRING DIAG: Right lateralized reverse total shoulder arthroplasty   THERAPY DIAG:  Acute pain of right shoulder  Stiffness of right shoulder, not elsewhere classified  Muscle weakness (generalized)  Localized edema  Rationale for Evaluation and Treatment: Rehabilitation  ONSET DATE: 06/29/24  SUBJECTIVE:                                                                                                                                                                                     Per eval: Patient underwent Rt lateralized RTSA surgery on 06/29/24. Right now the pain is pretty good since taking tylenol  and ibuprofen this morning. Pain has ebbed and flowed over the past 2 days. Does have some tingling in the thumb currently. All of her hobbies involve her arms including crafting and gardening. Wants to be able to use her sewing machine and is having difficulty with basic daily tasks right now.  Hand dominance: Left  SUBJECTIVE STATEMENT: 10/19/2024: still feels stiff into ER, limited by pain. Stretching everyday, strengthening every other day. Still feels fatigued with activities. Has started trying to work into a bit more reaching activities.     PERTINENT HISTORY: Fibromyalgia  A-fib Chronic low back pain   PAIN:  Are you having pain? Yes: NPRS scale: 0/10  Pain location: Rt anterior shoulder Pain description: sore Aggravating factors: reaching Relieving factors: medication  PRECAUTIONS: Other: see protocol for specific ROM and lifting restrictions  RED FLAGS: None   WEIGHT BEARING RESTRICTIONS: RUE WBAT  FALLS:  Has patient fallen in last 6 months? No  LIVING ENVIRONMENT: Lives with: lives with their spouse Lives in: House/apartment Stairs: Yes: External: 8 steps; can reach both Has following equipment  at home: Quad cane small base  OCCUPATION: Retired   PLOF: Independent  PATIENT GOALS: I want to use my arm again.   NEXT MD VISIT: August 2026   OBJECTIVE:  Note: Objective measures were completed at Evaluation unless otherwise noted.  DIAGNOSTIC FINDINGS:  None on file   PATIENT SURVEYS :  Quick DASH: 81.8% disability   08/16/24 QuickDASH 52.3%   09/22/24: QuickDASH: 43% disability   10/12/24: QuickDASH: 25%  disability   COGNITION: Overall cognitive status: Within functional limits for tasks assessed     SENSATION: Not tested  POSTURE: Rt arm in sling, forward shoulders   UPPER EXTREMITY ROM:   Passive ROM Right eval Left eval 07/08/24 Right  PROM 07/13/24 Right PROM 07/20/24 R PROM 07/29/24 PROM Right  08/16/24 Right PROM 08/30/24 09/08/24 09/22/24 Rt  10/12/24 Right AROM  Shoulder flexion 30   50 89  88 98 deg s 125 AROM supine scapular plane 125 AROM supine scapular plane 145 AROM supine scapular plane; 120 AROM in standing 130 standing  Shoulder extension             Shoulder abduction          120 AROM supine; 80 AROM in standing 80 standing  Shoulder adduction             Shoulder internal rotation             Shoulder external rotation 0    12 10 deg 0 pain  15 deg s 0 AROM/PROM; supine pain   0 supine AROM and PROM pain 15 AROM in standing with arm at side  Elbow flexion             Elbow extension Full             Wrist flexion             Wrist extension             Wrist ulnar deviation             Wrist radial deviation             Wrist pronation             Wrist supination             (Blank rows = not tested)  UPPER EXTREMITY MMT: deferred on eval due to post-op acuity   MMT Right eval Left eval  Shoulder flexion    Shoulder extension    Shoulder abduction    Shoulder adduction    Shoulder internal rotation    Shoulder external rotation    Middle trapezius    Lower trapezius    Elbow flexion    Elbow extension    Wrist flexion     Wrist extension    Wrist ulnar deviation    Wrist radial deviation    Wrist pronation    Wrist supination    Grip strength (lbs)    (Blank rows = not tested)    PALPATION:  Not assessed    [facility] Adult PT Treatment:                                                DATE: 10/19/24 Therapeutic Exercise: Finger ladder x20 total  Bicep isometric at 90 deg elbow flexion, submax x8  Shoulder IR AROM to glute 2x8  HEP discussion/education, emphasis on recovery between strength days, modification PRN  Neuromuscular re-ed: Closed chain ball roll at wall <90 deg 2x30sec CW/CCW  Red band shoulder ext 2x12 cues for lat activation    [facility] Adult PT Treatment:  DATE: 10/12/24  Neuromuscular re-ed: Bilateral ER 2 x 10; attempted with yellow band unable on the RUE Standing resisted shoulder extension 2 x 10  Bent over row 2 x 10 @ 2 lbs  Eccentric shoulder flexion  x 5  Therapeutic Activity: Re-assessment to determine overall progress, educating patient on progress towards goals Pulley's flexion x 2 minutes  Finger ladder flexion and abduction x 5 each  Standing shoulder abduction AROM x 10  Functional IR reach with strap x 10 Shoulder ER AAROM with stool x 10  Curl to overhead lift x 10     [facility] Adult PT Treatment:                                                DATE: 10/05/24 Therapeutic Exercise: Reviewed and updated HEP demo and returning demo as needed Concentration curl 2 x 10 @ 2 lbs; in standing has difficulty  Neuromuscular re-ed: Shoulder wall taps 2 x 10  Scapular retraction at wall x 10  Therapeutic Activity: Pulleys flexion and scaption x 2 minutes each  Functional IR reach with strap x 10     [facility] Adult PT Treatment:                                                DATE: 09/28/24 Therapeutic Exercise: Doorway ER stretch x 20 sec Reviewed and updated HEP  Neuromuscular re-ed: Reactive isometrics ER/IR yellow band x  10 each  Resisted shoulder extension red band 2 x 10  Standing shoulder abduction AROM 2 x 5; mirror  Therapeutic Activity: Pulleys flexion and scaption x 2 minutes each  Finger ladder flexion x 5  Functional IR reach x 10  Standing shoulder flexion 2 x 5 @ 1 lb; mirror     PATIENT EDUCATION: Education details: HEP review Person educated: Patient Education method: Programmer, Multimedia, Demonstration, Actor cues, and Verbal cues,  Education comprehension: verbalized understanding, returned demonstration, verbal cues required, tactile cues required, and needs further education  HOME EXERCISE PROGRAM: Access Code: [facility] URL: [email or web address] Date: 10/19/2024 Prepared by: Alm Jenny  Exercises - Standing Shoulder Internal Rotation Stretch with Towel  - 1 x daily - 7 x weekly - 1 sets - 10 reps - 5 sec  hold - Shoulder Taps on Table  - 1 x daily - 7 x weekly - 2 sets - 10 reps - Standing Shoulder External Rotation Stretch in Doorway  - 1 x daily - 7 x weekly - 3 sets - 20 sec  hold - Seated Shoulder Flexion Towel Slide at Table Top  - 1 x daily - 7 x weekly - 1 sets - 5 reps - Seated Shoulder Scaption Slide at Table Top with Forearm in Neutral  - 1 x daily - 7 x weekly - 1 sets - 5 reps - Shoulder Flexion Overhead with Dowel  - 1 x daily - 7 x weekly - 1 sets - 10 reps - Standing Shoulder Abduction AAROM with Dowel  - 1 x daily - 7 x weekly - 1 sets - 10 reps - Standing Shoulder External Rotation AAROM with Dowel  - 1 x daily - 7 x weekly - 1 sets - 10 reps - Standing Shoulder Extension  -  1 x daily - 7 x weekly - 2 sets - 10 reps - Shoulder Abduction - Thumbs Up  - 1 x daily - 7 x weekly - 2 sets - 5 reps - Standing Elbow Extension with Self-Anchored Resistance  - 1 x daily - 3 x weekly - 2 sets - 10 reps - Standing Shoulder Row with Anchored Resistance  - 1 x daily - 3 x weekly - 2 sets - 10 reps - Shoulder extension with resistance - Neutral  - 3 x weekly - 2-3  sets - 8 reps - Shoulder External Rotation Reactive Isometrics  - 1 x daily - 3 x weekly - 1 sets - 10 reps - Shoulder Internal Rotation Reactive Isometrics  - 1 x daily - 3 x weekly - 1 sets - 10 reps - Standing Shoulder Flexion to 90 Degrees with Dumbbells  - 1 x daily - 3 x weekly - 2 sets - 5 reps - Seated Single Arm Bicep Curls Supinated with Dumbbell  - 1 x daily - 3 x weekly - 2 sets - 10 reps - Standing Wall [employer] with Mini Swiss Ball  - 3 x weekly - 2-3 sets - 15-20sec hold - Standing Shoulder Internal Rotation AROM Behind Back  - 3 x weekly - 2-3 sets - 5-8 reps  ASSESSMENT:  CLINICAL IMPRESSION: 10/19/2024: Pt arrives w/ report of slow/steady progress. Today continuing to work on functional reaching tolerance, periscapular stability. Demonstrates tendency for compensations into IR with shoulder elevation, requiring frequent cues to correct. Requires rest breaks to mitigate muscular fatigue but overall tolerates well without adverse event or increase in resting pain. Recommend continuing along current POC in order to address relevant deficits and improve functional tolerance. Pt departs today's session in no acute distress, all voiced questions/concerns addressed appropriately from PT perspective.     Per eval: Patient is a 73 y.o. female who was seen today for physical therapy evaluation and treatment for s/p Rt lateralized RTSA on 06/29/24. She demonstrates Rt shoulder ROM and strength deficits that are consistent with her recent post-operative status. She will benefit from skilled PT to assist in restoring her RUE strength and ROM in order to optimize her function and assist in overall pain reduction.    OBJECTIVE IMPAIRMENTS: decreased activity tolerance, decreased knowledge of condition, decreased knowledge of use of DME, decreased ROM, decreased strength, increased edema, impaired UE functional use, postural dysfunction, and pain.   ACTIVITY LIMITATIONS: carrying, lifting,  transfers, bed mobility, bathing, toileting, dressing, self feeding, reach over head, and hygiene/grooming  PARTICIPATION LIMITATIONS: meal prep, cleaning, laundry, driving, shopping, community activity, and yard work  PERSONAL FACTORS: Age, Fitness, Time since onset of injury/illness/exacerbation, and 3+ comorbidities: see PMH above are also affecting patient's functional outcome.   REHAB POTENTIAL: Good  CLINICAL DECISION MAKING: Stable/uncomplicated  EVALUATION COMPLEXITY: Low  GOALS: Goals reviewed with patient? Yes  SHORT TERM GOALS: Target date: 08/12/2024    Patient will be independent and compliant with initial HEP.   Baseline: initial HEP issued  08/16/24: reports intermittent HEP adherence (PT on hold until today)  Goal status: MET  2.  Patient will demonstrate at least 120 degrees of Rt shoulder flexion PROM to facilitate progression towards AROM activity.  Baseline: see above 08/16/24: see ROM chart above Goal status: MET  3.  Patient will demonstrate at least 30 degrees of Rt shoulder ER PROM to facilitate progression towards AROM activity.  Baseline: see above  08/16/24: see ROM chart above Goal status:  PROGRESSING   LONG TERM GOALS: Target date: 11/19/24    Patient will demonstrate at least 140 degrees of shoulder flexion AROM without shrug to improve ability to reach into cabinets.  Baseline: AROM deferred  08/16/24: see ROM chart above Goal status: PROGRESSING  2.  Patient will be able to complete sewing activity with minimal/no Rt shoulder pain.  Baseline: unable  08/16/24: not currently sewing, as expected 09/22/24: not currently sewing 10/12/24: was able to sew reporting muscle soreness Goal status: MET  3.  Patient will demonstrate at least 45 degrees of Rt shoulder ER AROM to improve ability to complete self-care activities.  Baseline: deferred 08/16/24: see ROM chart above Goal status: PROGRESSING  4.  Patient will demonstrate at least 90  degrees of Rt shoulder abduction AROM to improve ability to complete reaching activities.  Baseline: deferred 08/16/24: see ROM chart above Goal status: PROGRESSING  5.  Patient will score </= 20 % disability on the QuickDASH (MCID is [id number]) to signify clinically meaningful improvement in functional abilities.   Baseline: see above 08/16/24: 52.3% Goal status: PROGRESSING   PLAN: PT FREQUENCY: 1x/week  PT DURATION: 8 weeks  PLANNED INTERVENTIONS: 97164- PT Re-evaluation, 97750- Physical Performance Testing, 97110-Therapeutic exercises, 97530- Therapeutic activity, [id number]- Neuromuscular re-education, 97535- Self Care, [id number]- Manual therapy, 97016- Vasopneumatic device, 20560 (1-2 muscles), 20561 (3+ muscles)- Dry Needling, Cryotherapy, and Moist heat  PLAN FOR NEXT SESSION: progress per protocol to tolerance. Focus on functional strengthening.    Alm DELENA Jenny PT, DPT 10/19/2024 2:45 PM

## 2024-10-23 ENCOUNTER — Other Ambulatory Visit: Payer: Self-pay | Admitting: Cardiology

## 2024-10-26 ENCOUNTER — Ambulatory Visit: Payer: Self-pay

## 2024-10-26 DIAGNOSIS — M25611 Stiffness of right shoulder, not elsewhere classified: Secondary | ICD-10-CM | POA: Diagnosis present

## 2024-10-26 DIAGNOSIS — M6281 Muscle weakness (generalized): Secondary | ICD-10-CM | POA: Diagnosis present

## 2024-10-26 DIAGNOSIS — R600 Localized edema: Secondary | ICD-10-CM | POA: Diagnosis present

## 2024-10-26 DIAGNOSIS — M25511 Pain in right shoulder: Secondary | ICD-10-CM | POA: Diagnosis present

## 2024-10-26 NOTE — Therapy (Signed)
 OUTPATIENT PHYSICAL THERAPY TREATMENT    Patient Name: Melissa Rodgers MRN: [id number] DOB:Jun 16, 1951, 73 y.o., female Today's Date: 10/26/2024       END OF SESSION:  PT End of Session - 10/26/24 1357     Visit Number 21    Number of Visits 24    Date for Recertification  11/19/24    Authorization Type MCR    Progress Note Due on Visit 29    PT Start Time 1359    PT Stop Time 1449    PT Time Calculation (min) 50 min    Activity Tolerance Patient tolerated treatment well    Behavior During Therapy [facility] for tasks assessed/performed                             Past Medical History:  Diagnosis Date   Allergy    seasonal   Cataract 2024   Chronic bilateral low back pain without sciatica 10/07/2016   DDD (degenerative disc disease), cervical 12/03/2020   Diverticulitis of sigmoid colon 08/01/2019   Dysrhythmia    Afib   Essential hypertension 09/09/2018   Family history of diabetes mellitus 06/09/2022   Fibromyalgia    Gastroesophageal reflux disease without esophagitis 11/06/2015   Generalized anxiety disorder with panic attacks 08/01/2019   History of prosthetic unicompartmental arthroplasty of left knee 09/06/2019   History of prosthetic unicompartmental arthroplasty of right knee 09/06/2019   Hyperlipidemia 09/09/2018   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   Lumbosacral spondylosis 10/07/2016   Mass of lower inner quadrant of right breast 07/11/2022   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 Onetha Larger, [facility]. polyp 2 mm in the sigmoid colon, ( Polypectomy). Moderate diverticulosis of the descending colon and sigmoid colon. Lipoma in the ascending colon. Internal hemorrhoids.   ESOPHAGOGASTRODUODENOSCOPY     possibly last one was 2017. In [place]   HERNIA REPAIR     JOINT REPLACEMENT     MEDIAL PARTIAL KNEE REPLACEMENT Right 12/14/2014   NISSEN FUNDOPLICATION  02/21/2016   OOPHORECTOMY     PARTIAL KNEE ARTHROPLASTY Left 04/05/2014   TOTAL KNEE REVISION Left 05/10/2021   Procedure: TOTAL KNEE REVISION;  Surgeon: Yvone Rush, MD;  Location: [facility];  Service: Orthopedics;  Laterality: Left;   TUBAL LIGATION     Patient Active Problem List   Diagnosis Date Noted   Cataract    Mild coronary artery disease 05/04/2024   Preoperative cardiovascular examination 05/04/2024   Polyarthralgia with elevated ESR 03/15/2024   Rotator cuff tear, right 01/29/2024   Allergy    Mass of lower inner quadrant of right breast 07/11/2022   Family history of diabetes mellitus 06/09/2022   Paroxysmal atrial fibrillation (HCC) 07/15/2021   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/2021   DDD (degenerative disc disease), cervical 12/03/2020   Fibromyalgia 03/05/2020   History of prosthetic unicompartmental arthroplasty of left knee 09/06/2019   History of prosthetic unicompartmental arthroplasty of  right knee 09/06/2019   Diverticulitis of sigmoid colon 08/01/2019   Generalized anxiety disorder with panic attacks 08/01/2019   Non-seasonal allergic rhinitis 09/09/2018   Essential hypertension 09/09/2018   Mixed dyslipidemia 09/09/2018   Hyperlipidemia 09/09/2018   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   Lumbosacral spondylosis 10/07/2016   Primary osteoarthritis of both knees post bilateral hemiarthroplasty 10/07/2016   Chronic bilateral low back  pain without sciatica 10/07/2016   Atrial fibrillation 04/17/2016   S/P laparoscopic fundoplication 03/17/2016   Gastroesophageal reflux disease without esophagitis 11/06/2015   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    PCP: Willo Mini, NP  REFERRING PROVIDER: Cristy Bonner DASEN, MD  REFERRING DIAG: Right lateralized reverse total shoulder arthroplasty   THERAPY DIAG:  Acute pain of right shoulder  Stiffness of right shoulder, not elsewhere classified  Muscle weakness (generalized)  Localized edema  Rationale for Evaluation and Treatment: Rehabilitation  ONSET DATE: 06/29/24  SUBJECTIVE:                                                                                                                                                                                     Per eval: Patient underwent Rt lateralized RTSA surgery on 06/29/24. Right now the pain is pretty good since taking tylenol  and ibuprofen this morning. Pain has ebbed and flowed over the past 2 days. Does have some tingling in the thumb currently. All of her hobbies involve her arms including crafting and gardening. Wants to be able to use her sewing machine and is having difficulty with basic daily tasks right now.  Hand dominance: Left  SUBJECTIVE STATEMENT: Patient reports she is sore all over from doing more housework and yardwork this past week. She sustained a fall on Monday where she tripped going up the stairs. She landed on her hands, but did not experience severe pain from this fall.     PERTINENT HISTORY: Fibromyalgia  A-fib Chronic low back pain   PAIN:  Are you having pain? Yes: NPRS scale: 3/10  Pain location: Rt anterior shoulder Pain description: sore Aggravating factors: reaching,overactivity Relieving factors: medication  PRECAUTIONS: Other: see protocol for specific ROM and lifting restrictions  RED FLAGS: None   WEIGHT BEARING RESTRICTIONS: RUE WBAT  FALLS:  Has patient fallen in last 6 months?  No  LIVING ENVIRONMENT: Lives with: lives with their spouse Lives in: House/apartment Stairs: Yes: External: 8 steps; can reach both Has following equipment at home: Quad cane small base  OCCUPATION: Retired   PLOF: Independent  PATIENT GOALS: I want to use my arm again.   NEXT MD VISIT: August 2026   OBJECTIVE:  Note: Objective measures were completed at Evaluation unless otherwise noted.  DIAGNOSTIC FINDINGS:  None on file   PATIENT SURVEYS :  Quick DASH: 81.8% disability   08/16/24 QuickDASH 52.3%   09/22/24: QuickDASH: 43% disability   10/12/24: QuickDASH: 25% disability   COGNITION: Overall cognitive status: Within functional limits for tasks assessed     SENSATION: Not tested  POSTURE: Rt arm in sling, forward shoulders   UPPER EXTREMITY ROM:   Passive ROM Right eval Left eval 07/08/24 Right  PROM 07/13/24 Right PROM 07/20/24 R PROM 07/29/24 PROM Right  08/16/24 Right PROM 08/30/24 09/08/24 09/22/24 Rt  10/12/24 Right AROM 10/26/24 Right AROM  Shoulder flexion 30   50 89  88 98 deg s 125 AROM supine scapular plane 125 AROM supine scapular plane 145 AROM supine scapular plane; 120 AROM in standing 130 standing 135 standing   Shoulder extension              Shoulder abduction          120 AROM supine; 80 AROM in standing 80 standing 90 standing   Shoulder adduction              Shoulder internal rotation              Shoulder external rotation 0    12 10 deg 0 pain  15 deg s 0 AROM/PROM; supine pain   0 supine AROM and PROM pain 15 AROM in standing with arm at side 20 AROM in standing with arm at side   Elbow flexion              Elbow extension Full              Wrist flexion              Wrist extension              Wrist ulnar deviation              Wrist radial deviation              Wrist pronation              Wrist supination              (Blank rows = not tested)  UPPER EXTREMITY MMT: deferred on eval due to post-op acuity   MMT Right eval  Left eval  Shoulder flexion    Shoulder extension    Shoulder abduction    Shoulder adduction    Shoulder internal rotation    Shoulder external rotation    Middle trapezius    Lower trapezius    Elbow flexion    Elbow extension    Wrist flexion    Wrist extension    Wrist ulnar deviation    Wrist radial deviation    Wrist pronation    Wrist supination    Grip strength (lbs)    (Blank rows = not tested)    PALPATION:  Not assessed  [facility] Adult PT Treatment:                                                DATE: 10/26/24  Neuromuscular re-ed: Serratus wall slides with foam roller 2 x 10  Supported ER AROM at table 2 x 10  Resisted horizontal shoulder adduction 2 x 10 yellow band  Resisted shoulder extension red band 2 x 10  Therapeutic Activity: Pulleys x 2 minute flexion  Shoulder ER AAROM at table x 10  Shoulder ER AAROM with elbow supported on table with cane x 10  Standing shoulder flexion AROM x 5  Standing shoulder abduction AROM x 5  Modalities: Game ready (VASO) Rt shoulder x 10 minutes, 34 degrees, low compression    [facility] Adult PT Treatment:                                                DATE: 10/19/24 Therapeutic Exercise: Finger ladder x20 total  Bicep isometric at 90 deg elbow flexion, submax x8  Shoulder IR AROM to glute 2x8  HEP discussion/education, emphasis on recovery between strength days, modification PRN  Neuromuscular re-ed: Closed chain ball roll at wall <90 deg 2x30sec CW/CCW  Red band shoulder ext 2x12 cues for lat activation    [facility] Adult PT Treatment:                                                DATE: 10/12/24  Neuromuscular re-ed: Bilateral ER 2 x 10; attempted with yellow band unable on the RUE Standing resisted shoulder extension 2 x 10  Bent over row 2 x 10 @ 2 lbs  Eccentric shoulder flexion  x 5  Therapeutic Activity: Re-assessment to determine overall progress, educating patient on progress towards goals Pulley's flexion x  2 minutes  Finger ladder flexion and abduction x 5 each  Standing shoulder abduction AROM x 10  Functional IR reach with strap x 10 Shoulder ER AAROM with stool x 10  Curl to overhead lift x 10     [facility] Adult PT Treatment:                                                DATE: 10/05/24 Therapeutic Exercise: Reviewed and updated HEP demo and returning demo as needed Concentration curl 2 x 10 @ 2 lbs; in standing has difficulty  Neuromuscular re-ed: Shoulder wall taps 2 x 10  Scapular retraction at wall x 10  Therapeutic Activity: Pulleys flexion and scaption x 2 minutes each  Functional IR reach with strap x 10     [facility] Adult PT Treatment:                                                DATE: 09/28/24 Therapeutic Exercise: Doorway ER stretch x 20 sec Reviewed and updated HEP  Neuromuscular re-ed: Reactive isometrics ER/IR yellow band x 10 each  Resisted shoulder extension red band 2 x 10  Standing shoulder abduction AROM 2 x 5; mirror  Therapeutic Activity: Pulleys flexion and scaption x 2 minutes each  Finger ladder flexion x 5  Functional IR reach x 10  Standing shoulder flexion 2 x 5 @ 1 lb; mirror     PATIENT EDUCATION: Education details: HEP update Person educated: Patient Education method: Explanation, Demonstration, Tactile cues, and Verbal cues,  Education comprehension: verbalized understanding, returned demonstration,  verbal cues required, tactile cues required, and needs further education  HOME EXERCISE PROGRAM: Access Code: [facility] URL: [email or web address] Date: 10/26/2024 Prepared by: Lucie Meeter  Exercises - Standing Shoulder Internal Rotation Stretch with Towel  - 1 x daily - 7 x weekly - 1 sets - 10 reps - 5 sec  hold - Shoulder Taps on Table  - 1 x daily - 7 x weekly - 2 sets - 10 reps - Seated AAROM Shoulder External Rotation at 90 Degrees with Counter Support Using Cane  - 1 x daily - 7 x weekly - 1 sets - 10 reps - Seated  Shoulder External Rotation PROM on Table  - 1 x daily - 7 x weekly - 1 sets - 10 reps - Seated Shoulder Flexion Towel Slide at Table Top  - 1 x daily - 7 x weekly - 1 sets - 5 reps - Seated Shoulder Scaption Slide at Table Top with Forearm in Neutral  - 1 x daily - 7 x weekly - 1 sets - 5 reps - Shoulder Flexion Overhead with Dowel  - 1 x daily - 7 x weekly - 1 sets - 10 reps - Standing Shoulder Abduction AAROM with Dowel  - 1 x daily - 7 x weekly - 1 sets - 10 reps - Standing Shoulder External Rotation AAROM with Dowel  - 1 x daily - 7 x weekly - 1 sets - 10 reps - Shoulder Abduction - Thumbs Up  - 1 x daily - 7 x weekly - 2 sets - 5 reps - Standing Elbow Extension with Self-Anchored Resistance  - 1 x daily - 3 x weekly - 2 sets - 10 reps - Standing Shoulder Row with Anchored Resistance  - 1 x daily - 3 x weekly - 2 sets - 10 reps - Shoulder extension with resistance - Neutral  - 3 x weekly - 2-3 sets - 8 reps - Shoulder Internal Rotation Reactive Isometrics  - 1 x daily - 3 x weekly - 1 sets - 10 reps - Standing Shoulder Flexion to 90 Degrees with Dumbbells  - 1 x daily - 3 x weekly - 2 sets - 5 reps - Seated Single Arm Bicep Curls Supinated with Dumbbell  - 1 x daily - 3 x weekly - 2 sets - 10 reps - Standing Wall [employer] with Mini Swiss Ball  - 3 x weekly - 2-3 sets - 15-20sec hold - Standing Shoulder Internal Rotation AROM Behind Back  - 3 x weekly - 2-3 sets - 5-8 reps - Seated Shoulder External Rotation in Abduction Supported with Dumbbell  - 1 x daily - 3 x weekly - 2 sets - 10 reps  ASSESSMENT:  CLINICAL IMPRESSION: Patient arrives with general soreness from housework and yardwork this week. We were able to modify ER ROM activity in more abducted positioning, which she tolerates better than maintaining arm at side, which has been completed at previous sessions. Her flexion, abduction, and ER AROM have gradually improved compared to previous assessment having met LTG abduction ROM  goal. She fatigued quickly with standing shoulder flexion and abduction AROM with ability to complete 5 reps before requesting to discontinue due to fatigue and ache. Finished session with game ready per patient's request to assist in reducing soreness/ache.    Per eval: Patient is a 73 y.o. female who was seen today for physical therapy evaluation and treatment for s/p Rt lateralized RTSA on 06/29/24. She demonstrates Rt shoulder ROM and strength deficits that  are consistent with her recent post-operative status. She will benefit from skilled PT to assist in restoring her RUE strength and ROM in order to optimize her function and assist in overall pain reduction.    OBJECTIVE IMPAIRMENTS: decreased activity tolerance, decreased knowledge of condition, decreased knowledge of use of DME, decreased ROM, decreased strength, increased edema, impaired UE functional use, postural dysfunction, and pain.   ACTIVITY LIMITATIONS: carrying, lifting, transfers, bed mobility, bathing, toileting, dressing, self feeding, reach over head, and hygiene/grooming  PARTICIPATION LIMITATIONS: meal prep, cleaning, laundry, driving, shopping, community activity, and yard work  PERSONAL FACTORS: Age, Fitness, Time since onset of injury/illness/exacerbation, and 3+ comorbidities: see PMH above are also affecting patient's functional outcome.   REHAB POTENTIAL: Good  CLINICAL DECISION MAKING: Stable/uncomplicated  EVALUATION COMPLEXITY: Low  GOALS: Goals reviewed with patient? Yes  SHORT TERM GOALS: Target date: 08/12/2024    Patient will be independent and compliant with initial HEP.   Baseline: initial HEP issued  08/16/24: reports intermittent HEP adherence (PT on hold until today)  Goal status: MET  2.  Patient will demonstrate at least 120 degrees of Rt shoulder flexion PROM to facilitate progression towards AROM activity.  Baseline: see above 08/16/24: see ROM chart above Goal status: MET  3.  Patient  will demonstrate at least 30 degrees of Rt shoulder ER PROM to facilitate progression towards AROM activity.  Baseline: see above  08/16/24: see ROM chart above Goal status: PROGRESSING   LONG TERM GOALS: Target date: 11/19/24    Patient will demonstrate at least 140 degrees of shoulder flexion AROM without shrug to improve ability to reach into cabinets.  Baseline: AROM deferred  08/16/24: see ROM chart above Goal status: PROGRESSING  2.  Patient will be able to complete sewing activity with minimal/no Rt shoulder pain.  Baseline: unable  08/16/24: not currently sewing, as expected 09/22/24: not currently sewing 10/12/24: was able to sew reporting muscle soreness Goal status: MET  3.  Patient will demonstrate at least 45 degrees of Rt shoulder ER AROM to improve ability to complete self-care activities.  Baseline: deferred 08/16/24: see ROM chart above Goal status: PROGRESSING  4.  Patient will demonstrate at least 90 degrees of Rt shoulder abduction AROM to improve ability to complete reaching activities.  Baseline: deferred 08/16/24: see ROM chart above Goal status: MET  5.  Patient will score </= 20 % disability on the QuickDASH (MCID is 8-15.9) to signify clinically meaningful improvement in functional abilities.   Baseline: see above 08/16/24: 52.3% Goal status: PROGRESSING   PLAN: PT FREQUENCY: 1x/week  PT DURATION: 8 weeks  PLANNED INTERVENTIONS: 97164- PT Re-evaluation, 97750- Physical Performance Testing, 97110-Therapeutic exercises, 97530- Therapeutic activity, [id number]- Neuromuscular re-education, 97535- Self Care, [id number]- Manual therapy, 97016- Vasopneumatic device, 20560 (1-2 muscles), 20561 (3+ muscles)- Dry Needling, Cryotherapy, and Moist heat  PLAN FOR NEXT SESSION: progress per protocol to tolerance. Focus on functional strengthening.   Sheffield Hawker, PT, DPT, ATC 10/26/24 2:43 PM

## 2024-10-27 ENCOUNTER — Encounter: Admitting: Rheumatology

## 2024-11-02 ENCOUNTER — Ambulatory Visit: Admitting: Physical Therapy

## 2024-11-06 ENCOUNTER — Other Ambulatory Visit: Payer: Self-pay

## 2024-11-06 ENCOUNTER — Ambulatory Visit
Admission: RE | Admit: 2024-11-06 | Discharge: 2024-11-06 | Disposition: A | Discharge status: Home / Self Care | Source: Ambulatory Visit | Attending: Family Medicine | Admitting: Family Medicine

## 2024-11-06 VITALS — BP 157/87 | HR 71 | Temp 98.0°F | Resp 16 | Wt 175.0 lb

## 2024-11-06 DIAGNOSIS — J209 Acute bronchitis, unspecified: Secondary | ICD-10-CM | POA: Diagnosis not present

## 2024-11-06 MED ORDER — AMOXICILLIN-POT CLAVULANATE 875-125 MG PO TABS: 1.0000 | ORAL_TABLET | Freq: Two times a day (BID) | ORAL | 0 refills | Status: DC

## 2024-11-06 MED ORDER — PREDNISONE 20 MG PO TABS: 20.0000 mg | ORAL_TABLET | Freq: Two times a day (BID) | ORAL | 0 refills | Status: DC

## 2024-11-06 NOTE — Discharge Instructions (Signed)
 Take the antibiotic 2 times a day. Consider a probiotic while on the antibiotic Take prednisone  once a day for 5 days Drink lots of fluids May continue over-the-counter cough medicine like Delsym or Mucinex DM See your doctor if not improving by next week

## 2024-11-06 NOTE — ED Provider Notes (Signed)
 Melissa Rodgers    CSN: [id number] Arrival date & time: 11/06/24  9040      History   Chief Complaint Chief Complaint  Patient presents with   Cough    Have had head cold for 11 days and then cough for least 9.  Was getting slowly better but then got worse again yesterday and I feel sick. - Entered by patient    HPI Melissa Rodgers is a 73 y.o. female.   Melissa Rodgers is here for an upper respiratory infection.  She has had cough and cold symptoms.  Sinus pressure and postnasal drip.  Some left ear pain.  She states that she gave it 10 days and if still not better, today feels worse.  Is coughing up scant sputum.  Has turned green.  Chest hurts with coughing.  Feels tired Patient is a non-smoker.  No underlying lung disease.  Prior smoker. Patient has atrial fibrillation and is on medication and anticoagulation chronically    Past Medical History:  Diagnosis Date   Allergy    seasonal   Cataract 2024   Chronic bilateral low back pain without sciatica 10/07/2016   DDD (degenerative disc disease), cervical 12/03/2020   Diverticulitis of sigmoid colon 08/01/2019   Dysrhythmia    Afib   Essential hypertension 09/09/2018   Family history of diabetes mellitus 06/09/2022   Fibromyalgia    Gastroesophageal reflux disease without esophagitis 11/06/2015   Generalized anxiety disorder with panic attacks 08/01/2019   History of prosthetic unicompartmental arthroplasty of left knee 09/06/2019   History of prosthetic unicompartmental arthroplasty of right knee 09/06/2019   Hyperlipidemia 09/09/2018   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   Lumbosacral spondylosis 10/07/2016   Mass of lower inner quadrant of right breast 07/11/2022   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/2021   Mild coronary artery disease 05/04/2024   Mixed dyslipidemia 09/09/2018   Non-seasonal allergic rhinitis 09/09/2018   Paroxysmal atrial fibrillation (HCC) 07/15/2021   Polyarthralgia with  elevated ESR 03/15/2024   Preoperative cardiovascular examination 05/04/2024   Primary osteoarthritis of both knees post bilateral hemiarthroplasty 10/07/2016   Rotator cuff tear, right 01/29/2024   S/P laparoscopic fundoplication 03/17/2016   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    Patient Active Problem List   Diagnosis Date Noted   Cataract    Mild coronary artery disease 05/04/2024   Preoperative cardiovascular examination 05/04/2024   Polyarthralgia with elevated ESR 03/15/2024   Rotator cuff tear, right 01/29/2024   Allergy    Mass of lower inner quadrant of right breast 07/11/2022   Family history of diabetes mellitus 06/09/2022   Paroxysmal atrial fibrillation (HCC) 07/15/2021   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/2021   DDD (degenerative disc disease), cervical 12/03/2020   Fibromyalgia 03/05/2020   History of prosthetic unicompartmental arthroplasty of left knee 09/06/2019   History of prosthetic unicompartmental arthroplasty of right knee 09/06/2019   Diverticulitis of sigmoid colon 08/01/2019   Generalized anxiety disorder with panic attacks 08/01/2019   Non-seasonal allergic rhinitis 09/09/2018   Essential hypertension 09/09/2018   Mixed dyslipidemia 09/09/2018   Hyperlipidemia 09/09/2018   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 Onetha Larger, [facility]. polyp 2 mm in the sigmoid colon, ( Polypectomy). Moderate diverticulosis of the descending colon and sigmoid colon. Lipoma in the ascending colon.  Internal hemorrhoids.   ESOPHAGOGASTRODUODENOSCOPY     possibly last one was 2017. In [place]   HERNIA REPAIR     JOINT REPLACEMENT     MEDIAL PARTIAL KNEE REPLACEMENT Right 12/14/2014   NISSEN FUNDOPLICATION  02/21/2016   OOPHORECTOMY     PARTIAL KNEE ARTHROPLASTY Left 04/05/2014   TOTAL KNEE REVISION Left 05/10/2021   Procedure: TOTAL KNEE REVISION;  Surgeon: Yvone Rush, MD;  Location: [facility];  Service: Orthopedics;  Laterality: Left;   TUBAL LIGATION      OB History   No obstetric history on file.      Home Medications    Prior to Admission medications  Medication Sig Start Date End Date Taking? Authorizing Provider  amoxicillin -clavulanate (AUGMENTIN ) 875-125 MG tablet Take 1 tablet by mouth every 12 (twelve) hours. 11/06/24  Yes Maranda Jamee Jacob, MD  predniSONE  (DELTASONE ) 20 MG tablet Take 1 tablet (20 mg total) by mouth 2 (two) times daily with a meal. 11/06/24  Yes Maranda Jamee Jacob, MD  Acetaminophen  (TYLENOL  PO) Take 2 tablets by mouth daily as needed (pain).    [provider]  bismuth subsalicylate (PEPTO BISMOL) 262 MG/15ML suspension Take 15 mLs by mouth as needed for indigestion.    [provider]  calcium  carbonate (TUMS - DOSED IN MG ELEMENTAL CALCIUM ) 500 MG chewable tablet Chew 1,000 mg by mouth as needed for indigestion or heartburn.    [provider]  cetirizine (ZYRTEC) 10 MG tablet Take 10 mg by mouth at bedtime.    [provider]  clonazePAM  (KLONOPIN ) 0.5 MG tablet Take 0.5 tablets (0.25 mg total) by mouth 2 (two) times daily as needed for anxiety. 12/11/23   Jessup, Joy, NP  Cyanocobalamin (B-12 PO) Take 1 tablet by mouth 2 (two) times a week.    [provider]  DIGESTIVE ENZYMES PO Take 1 capsule by mouth as needed.    [provider]  ELIQUIS  5 MG TABS tablet TAKE 1 TABLET TWICE A DAY 10/12/24   Monetta Redell PARAS, MD  flecainide  (TAMBOCOR ) 50 MG tablet TAKE 1 TABLET(50 MG) BY MOUTH TWICE DAILY 10/24/24    Monetta Redell PARAS, MD  FLUoxetine  (PROZAC ) 20 MG tablet Take 1 tablet (20 mg total) by mouth daily. 06/09/24   Willo Mini, NP  gabapentin  (NEURONTIN ) 100 MG capsule Take 1-3 capsules (100-300 mg total) by mouth 3 (three) times daily as needed. 07/11/24   Willo Mini, NP  metoprolol  succinate (TOPROL -XL) 25 MG 24 hr tablet Take 1 tablet (25 mg total) by mouth daily. 10/12/24   Monetta Redell PARAS, MD  ondansetron  (ZOFRAN -ODT) 4 MG disintegrating tablet Take 1 tablet (4 mg total) by mouth every 6 (six) hours as needed for nausea or vomiting. 04/22/23   Cirigliano, Vito V, DO  Polyethyl Glycol-Propyl Glycol (SYSTANE OP) Place 1 drop into both eyes in the morning and at bedtime.    [provider]  ZINC OXIDE, TOPICAL, EX Apply 1 Application topically as needed (rash under stomach).    [provider]    Family History Family History  Problem Relation Age of Onset   Diabetes Mother    Heart disease Mother    Anxiety disorder Mother    Arthritis Mother    Depression Mother    Cancer Father  small places of skin cancer to face, unsure of type   Alzheimer's disease Father        Symptom onset in late 60s   Heart disease Father    Hypertension Father    Atrial fibrillation Sister    Diabetes Sister    Hypertension Sister    Tremor Other        Significant maternal history of tremor; unknown regarding Parkinson's disease diagnoses   Colon cancer Neg Hx    Esophageal cancer Neg Hx    Stomach cancer Neg Hx    Rectal cancer Neg Hx     Social History Social History[1]   Allergies   Crestor [rosuvastatin], Sulfa antibiotics, and Latex   Review of Systems Review of Systems See HPI  Physical Exam Triage Vital Signs ED Triage Vitals  Encounter Vitals Group     BP 11/06/24 1026 (!) 157/87     Girls Systolic BP Percentile --      Girls Diastolic BP Percentile --      Boys Systolic BP Percentile --      Boys Diastolic BP Percentile --      Pulse Rate 11/06/24 1026  71     Resp 11/06/24 1026 16     Temp 11/06/24 1026 98 F (36.7 C)     Temp src --      SpO2 11/06/24 1026 93 %     Weight 11/06/24 1030 175 lb (79.4 kg)     Height --      Head Circumference --      Peak Flow --      Pain Score 11/06/24 1030 4     Pain Loc --      Pain Education --      Exclude from Growth Chart --    No data found.  Updated Vital Signs BP (!) 157/87   Pulse 71   Temp 98 F (36.7 C)   Resp 16   Wt 79.4 kg   SpO2 93%   BMI 31.00 kg/m       Physical Exam Constitutional:      General: She is not in acute distress.    Appearance: She is well-developed. She is ill-appearing.  HENT:     Head: Normocephalic and atraumatic.     Right Ear: Tympanic membrane normal.     Left Ear: Tympanic membrane normal.     Nose: Nose normal. No congestion.     Mouth/Throat:     Mouth: Mucous membranes are moist.     Pharynx: No posterior oropharyngeal erythema.  Eyes:     Conjunctiva/sclera: Conjunctivae normal.     Pupils: Pupils are equal, round, and reactive to light.  Cardiovascular:     Rate and Rhythm: Normal rate and regular rhythm.     Heart sounds: Normal heart sounds.  Pulmonary:     Effort: Pulmonary effort is normal. No respiratory distress.     Breath sounds: Wheezing present.  Abdominal:     General: There is no distension.     Palpations: Abdomen is soft.  Musculoskeletal:        General: Normal range of motion.     Cervical back: Normal range of motion.  Skin:    General: Skin is warm and dry.  Neurological:     Mental Status: She is alert.      UC Treatments / Results  Labs (all labs ordered are listed, but only abnormal results are displayed) Labs Reviewed - No data to  display  EKG   Radiology No results found.  Procedures Procedures (including critical Rodgers time)  Medications Ordered in UC Medications - No data to display  Initial Impression / Assessment and Plan / UC Course  I have reviewed the triage vital signs and the  nursing notes.  Pertinent labs & imaging results that were available during my Rodgers of the patient were reviewed by me and considered in my medical decision making (see chart for details).    I feel antibiotics are indicated due to the length of the illness and worsening symptoms at 10, 11 days. Final Clinical Impressions(s) / UC Diagnoses   Final diagnoses:  Acute bronchitis, unspecified organism     Discharge Instructions      Take the antibiotic 2 times a day. Consider a probiotic while on the antibiotic Take prednisone  once a day for 5 days Drink lots of fluids May continue over-the-counter cough medicine like Delsym or Mucinex DM See your doctor if not improving by next week   ED Prescriptions     Medication Sig Dispense Auth. Provider   amoxicillin -clavulanate (AUGMENTIN ) 875-125 MG tablet Take 1 tablet by mouth every 12 (twelve) hours. 14 tablet Maranda Jamee Jacob, MD   predniSONE  (DELTASONE ) 20 MG tablet Take 1 tablet (20 mg total) by mouth 2 (two) times daily with a meal. 10 tablet Maranda Jamee Jacob, MD      PDMP not reviewed this encounter.    [1]  Social History Tobacco Use   Smoking status: Former    Current packs/day: 0.00    Average packs/day: 0.3 packs/day for 5.1 years (1.3 ttl pk-yrs)    Types: Cigarettes    Start date: 07/25/1978    Quit date: 08/18/1983    Years since quitting: 41.2    Passive exposure: Past   Smokeless tobacco: Never  Vaping Use   Vaping status: Never Used  Substance Use Topics   Alcohol use: Yes    Alcohol/week: 1.0 - 3.0 standard drink of alcohol    Types: 1 - 3 Glasses of wine per week    Comment: 1-2 drinks a week, usually wine   Drug use: Never     Maranda Jamee Jacob, MD 11/06/24 1109

## 2024-11-06 NOTE — ED Triage Notes (Addendum)
 C/o head cold 11 days, cough for 9. Reports yesterday started feeling very unwell. Reports last night she had began coughing a lot and felt like she couldn't breathe well. Felt like she was getting sick all over again. Sts cough is productive but has not looked at it. Reports she had a temp yesterday of 99.5. otc cough and cold medication prn. Later sts left side of face and ear uncomfortable thinks she has sinus stuff going on.

## 2024-11-09 ENCOUNTER — Ambulatory Visit

## 2024-11-15 ENCOUNTER — Ambulatory Visit

## 2024-11-15 DIAGNOSIS — R600 Localized edema: Secondary | ICD-10-CM

## 2024-11-15 DIAGNOSIS — M25511 Pain in right shoulder: Secondary | ICD-10-CM

## 2024-11-15 DIAGNOSIS — M6281 Muscle weakness (generalized): Secondary | ICD-10-CM

## 2024-11-15 DIAGNOSIS — M25611 Stiffness of right shoulder, not elsewhere classified: Secondary | ICD-10-CM

## 2024-11-15 NOTE — Therapy (Signed)
 " OUTPATIENT PHYSICAL THERAPY TREATMENT   PHYSICAL THERAPY DISCHARGE SUMMARY  Visits from Start of Care: 22  Current functional level related to goals / functional outcomes: See goals below   Remaining deficits: See impression below   Education / Equipment: See education below    Patient agrees to discharge. Patient goals were partially met. Patient is being discharged due to being pleased with the current functional level.  Patient Name: Melissa Rodgers MRN: [id number] DOB:[birth year], 73 y.o., female Today's Date: 11/15/2024       END OF SESSION:  PT End of Session - 11/15/24 1358     Visit Number 22    Number of Visits 24    Date for Recertification  11/19/24    Authorization Type MCR    Progress Note Due on Visit 29    PT Start Time 1405    PT Stop Time 1444    PT Time Calculation (min) 39 min    Activity Tolerance Patient tolerated treatment well    Behavior During Therapy [facility] for tasks assessed/performed                              Past Medical History:  Diagnosis Date   Allergy    seasonal   Cataract 2024   Chronic bilateral low back pain without sciatica 10/07/2016   DDD (degenerative disc disease), cervical 12/03/2020   Diverticulitis of sigmoid colon 08/01/2019   Dysrhythmia    Afib   Essential hypertension 09/09/2018   Family history of diabetes mellitus 06/09/2022   Fibromyalgia    Gastroesophageal reflux disease without esophagitis 11/06/2015   Generalized anxiety disorder with panic attacks 08/01/2019   History of prosthetic unicompartmental arthroplasty of left knee 09/06/2019   History of prosthetic unicompartmental arthroplasty of right knee 09/06/2019   Hyperlipidemia 09/09/2018   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   Lumbosacral spondylosis 10/07/2016   Mass of lower inner quadrant of right breast 07/11/2022   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 Onetha Larger, [facility]. polyp 2 mm in the sigmoid colon, ( Polypectomy). Moderate diverticulosis of the descending colon and sigmoid colon. Lipoma in the ascending colon. Internal hemorrhoids.   ESOPHAGOGASTRODUODENOSCOPY     possibly last one was 2017. In [place]   HERNIA REPAIR     JOINT REPLACEMENT     MEDIAL PARTIAL KNEE REPLACEMENT Right 12/14/2014   NISSEN FUNDOPLICATION  02/21/2016   OOPHORECTOMY     PARTIAL KNEE ARTHROPLASTY Left 04/05/2014   TOTAL KNEE REVISION Left 05/10/2021   Procedure: TOTAL KNEE REVISION;  Surgeon: Yvone Rush, MD;  Location: [facility];  Service: Orthopedics;  Laterality: Left;   TUBAL LIGATION     Patient Active Problem List   Diagnosis Date Noted   Cataract    Mild coronary artery disease 05/04/2024   Preoperative cardiovascular examination 05/04/2024   Polyarthralgia with elevated ESR 03/15/2024   Rotator cuff tear, right 01/29/2024   Allergy  Mass of lower inner quadrant of right breast 07/11/2022   Family history of diabetes mellitus 06/09/2022   Paroxysmal atrial fibrillation (HCC) 07/15/2021   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/2021   DDD (degenerative disc disease), cervical 12/03/2020   Fibromyalgia 03/05/2020   History of prosthetic unicompartmental arthroplasty of left knee 09/06/2019   History of prosthetic unicompartmental arthroplasty of right knee 09/06/2019   Diverticulitis of sigmoid colon  08/01/2019   Generalized anxiety disorder with panic attacks 08/01/2019   Non-seasonal allergic rhinitis 09/09/2018   Essential hypertension 09/09/2018   Mixed dyslipidemia 09/09/2018   Hyperlipidemia 09/09/2018   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   Lumbosacral spondylosis 10/07/2016   Primary osteoarthritis of both knees post bilateral hemiarthroplasty 10/07/2016   Chronic bilateral low back pain without sciatica 10/07/2016   Atrial fibrillation 04/17/2016   S/P laparoscopic fundoplication 03/17/2016   Gastroesophageal reflux disease without esophagitis 11/06/2015   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    PCP: Willo Mini, NP  REFERRING PROVIDER: Cristy Bonner DASEN, MD  REFERRING DIAG: Right lateralized reverse total shoulder arthroplasty   THERAPY DIAG:  Acute pain of right shoulder  Stiffness of right shoulder, not elsewhere classified  Muscle weakness (generalized)  Localized edema  Rationale for Evaluation and Treatment: Rehabilitation  ONSET DATE: 06/29/24  SUBJECTIVE:                                                                                                                                                                                     Per eval: Patient underwent Rt lateralized RTSA surgery on 06/29/24. Right now the pain is pretty good since taking tylenol  and ibuprofen this morning. Pain has ebbed and flowed over the past 2 days. Does have some tingling in the thumb currently. All of her hobbies involve her arms including crafting and gardening. Wants to be able to use her sewing machine and is having difficulty with basic daily tasks right now.  Hand dominance: Left  SUBJECTIVE STATEMENT: Patient has been sick with bronchitis, causing her to miss previous sessions. Overall feels that the shoulder has made good progress and she feels that she is functional and ready for discharge. No pain in the shoulder currently.     PERTINENT HISTORY: Fibromyalgia  A-fib Chronic low  back pain   PAIN:  Are you having pain? No  PRECAUTIONS: Other: see protocol for specific ROM and lifting restrictions  RED FLAGS: None   WEIGHT BEARING RESTRICTIONS: RUE WBAT  FALLS:  Has patient fallen in last 6 months? No  LIVING ENVIRONMENT: Lives with: lives with their spouse Lives in: House/apartment Stairs: Yes: External: 8 steps; can reach both Has following equipment at  home: Quad cane small base  OCCUPATION: Retired   PLOF: Independent  PATIENT GOALS: I want to use my arm again.   NEXT MD VISIT: August 2026   OBJECTIVE:  Note: Objective measures were completed at Evaluation unless otherwise noted.  DIAGNOSTIC FINDINGS:  None on file   PATIENT SURVEYS :  Quick DASH: 81.8% disability   08/16/24 QuickDASH 52.3%   09/22/24: QuickDASH: 43% disability   10/12/24: QuickDASH: 25% disability    11/15/24: 15.9% disability  COGNITION: Overall cognitive status: Within functional limits for tasks assessed     SENSATION: Not tested  POSTURE: Rt arm in sling, forward shoulders   UPPER EXTREMITY ROM:   Passive ROM Right eval Left eval 07/08/24 Right  PROM 07/13/24 Right PROM 07/20/24 R PROM 07/29/24 PROM Right  08/16/24 Right PROM 08/30/24 09/08/24 09/22/24 Rt  10/12/24 Right AROM 10/26/24 Right AROM 11/15/24 Right AROM  Shoulder flexion 30   50 89  88 98 deg s 125 AROM supine scapular plane 125 AROM supine scapular plane 145 AROM supine scapular plane; 120 AROM in standing 130 standing 135 standing  140 standing  Shoulder extension               Shoulder abduction          120 AROM supine; 80 AROM in standing 80 standing 90 standing  100 standing   Shoulder adduction               Shoulder internal rotation               Shoulder external rotation 0    12 10 deg 0 pain  15 deg s 0 AROM/PROM; supine pain   0 supine AROM and PROM pain 15 AROM in standing with arm at side 20 AROM in standing with arm at side  30 AROM in standing with arm at side   Elbow  flexion               Elbow extension Full               Wrist flexion               Wrist extension               Wrist ulnar deviation               Wrist radial deviation               Wrist pronation               Wrist supination               (Blank rows = not tested)  UPPER EXTREMITY MMT: deferred on eval due to post-op acuity   MMT Right 11/15/24 Left eval  Shoulder flexion 4 in available range   Shoulder extension    Shoulder abduction 4 in available range    Shoulder adduction    Shoulder internal rotation 4   Shoulder external rotation 3+   Middle trapezius    Lower trapezius    Elbow flexion 5   Elbow extension 5   Wrist flexion    Wrist extension    Wrist ulnar deviation    Wrist radial deviation    Wrist pronation    Wrist supination    Grip strength (lbs)    (Blank rows = not tested)    PALPATION:  Not assessed  [facility] Adult PT Treatment:  DATE: 11/15/24 Therapeutic Exercise: Reviewed and updated HEP, educating patient on sets, reps, and ways to progress independently Performing as needed  Therapeutic Activity: Re-assessment to determine overall progress, educating patient on progress towards goals.     [facility] Adult PT Treatment:                                                DATE: 10/26/24  Neuromuscular re-ed: Serratus wall slides with foam roller 2 x 10  Supported ER AROM at table 2 x 10  Resisted horizontal shoulder adduction 2 x 10 yellow band  Resisted shoulder extension red band 2 x 10  Therapeutic Activity: Pulleys x 2 minute flexion  Shoulder ER AAROM at table x 10  Shoulder ER AAROM with elbow supported on table with cane x 10  Standing shoulder flexion AROM x 5  Standing shoulder abduction AROM x 5  Modalities: Game ready (VASO) Rt shoulder x 10 minutes, 34 degrees, low compression    [facility] Adult PT Treatment:                                                DATE: 10/19/24 Therapeutic  Exercise: Finger ladder x20 total  Bicep isometric at 90 deg elbow flexion, submax x8  Shoulder IR AROM to glute 2x8  HEP discussion/education, emphasis on recovery between strength days, modification PRN  Neuromuscular re-ed: Closed chain ball roll at wall <90 deg 2x30sec CW/CCW  Red band shoulder ext 2x12 cues for lat activation    PATIENT EDUCATION: Education details: HEP update Person educated: Patient Education method: Medical Illustrator,  Education comprehension: verbalized understanding and returned demonstration  HOME EXERCISE PROGRAM: Access Code: [facility] URL: [email or web address] Date: 11/15/2024 Prepared by: Lucie Meeter  Exercises - Standing Shoulder Internal Rotation AAROM with Dowel  - 1 x daily - 7 x weekly - 1 sets - 10 reps - 5 sec  hold - Seated Shoulder External Rotation in Abduction Supported with Dumbbell  - 1 x daily - 3 x weekly - 1 sets - 10 reps - Seated AAROM Shoulder External Rotation at 90 Degrees with Counter Support Using Cane  - 1 x daily - 7 x weekly - 1 sets - 10 reps - Seated Shoulder External Rotation PROM on Table  - 1 x daily - 7 x weekly - 1 sets - 10 reps - Shoulder Flexion Overhead with Dowel  - 1 x daily - 7 x weekly - 1 sets - 10 reps - Standing Shoulder Abduction AAROM with Dowel  - 1 x daily - 7 x weekly - 1 sets - 10 reps - Standing Shoulder External Rotation AAROM with Dowel  - 1 x daily - 7 x weekly - 1 sets - 10 reps - Shoulder Taps on Table  - 1 x daily - 3 x weekly - 2 sets - 10 reps - Shoulder Abduction - Thumbs Up  - 1 x daily - 3 x weekly - 2 sets - 5 reps - Standing Elbow Extension with Self-Anchored Resistance  - 1 x daily - 3 x weekly - 2 sets - 10 reps - Standing Shoulder Row with Anchored Resistance  - 1 x daily - 3 x weekly - 2 sets - 10  reps - Shoulder extension with resistance - Neutral  - 3 x weekly - 2-3 sets - 8 reps - Shoulder Internal Rotation Reactive Isometrics  - 1 x daily - 3 x  weekly - 1 sets - 10 reps - Standing Shoulder Flexion to 90 Degrees with Dumbbells  - 1 x daily - 3 x weekly - 2 sets - 5 reps - Seated Single Arm Bicep Curls Supinated with Dumbbell  - 1 x daily - 3 x weekly - 2 sets - 10 reps - Standing Wall [employer] with Mini Swiss Ball  - 3 x weekly - 2-3 sets - 15-20sec hold  ASSESSMENT:  CLINICAL IMPRESSION: Isabel reports overall improvement in pain and functionality of the Rt shoulder s/p Rt lateralized RTSA on 06/29/24. She is pleased with her current functional progress and feels that she is ready for discharge. She demonstrates overall improvements in Rt shoulder strength and ROM. She is independent with advanced home program and is appropriate for discharge at this time with patient in agreement with this plan.    Per eval: Patient is a 73 y.o. female who was seen today for physical therapy evaluation and treatment for s/p Rt lateralized RTSA on 06/29/24. She demonstrates Rt shoulder ROM and strength deficits that are consistent with her recent post-operative status. She will benefit from skilled PT to assist in restoring her RUE strength and ROM in order to optimize her function and assist in overall pain reduction.    OBJECTIVE IMPAIRMENTS: decreased activity tolerance, decreased knowledge of condition, decreased knowledge of use of DME, decreased ROM, decreased strength, increased edema, impaired UE functional use, postural dysfunction, and pain.   ACTIVITY LIMITATIONS: carrying, lifting, transfers, bed mobility, bathing, toileting, dressing, self feeding, reach over head, and hygiene/grooming  PARTICIPATION LIMITATIONS: meal prep, cleaning, laundry, driving, shopping, community activity, and yard work  PERSONAL FACTORS: Age, Fitness, Time since onset of injury/illness/exacerbation, and 3+ comorbidities: see PMH above are also affecting patient's functional outcome.   REHAB POTENTIAL: Good  CLINICAL DECISION MAKING:  Stable/uncomplicated  EVALUATION COMPLEXITY: Low  GOALS: Goals reviewed with patient? Yes  SHORT TERM GOALS: Target date: 08/12/2024    Patient will be independent and compliant with initial HEP.   Baseline: initial HEP issued  08/16/24: reports intermittent HEP adherence (PT on hold until today)  Goal status: MET  2.  Patient will demonstrate at least 120 degrees of Rt shoulder flexion PROM to facilitate progression towards AROM activity.  Baseline: see above 08/16/24: see ROM chart above Goal status: MET  3.  Patient will demonstrate at least 30 degrees of Rt shoulder ER PROM to facilitate progression towards AROM activity.  Baseline: see above  08/16/24: see ROM chart above Goal status: MET   LONG TERM GOALS: Target date: 11/19/24    Patient will demonstrate at least 140 degrees of shoulder flexion AROM without shrug to improve ability to reach into cabinets.  Baseline: AROM deferred  08/16/24: see ROM chart above Goal status: MET  2.  Patient will be able to complete sewing activity with minimal/no Rt shoulder pain.  Baseline: unable  08/16/24: not currently sewing, as expected 09/22/24: not currently sewing 10/12/24: was able to sew reporting muscle soreness Goal status: MET  3.  Patient will demonstrate at least 45 degrees of Rt shoulder ER AROM to improve ability to complete self-care activities.  Baseline: deferred 08/16/24: see ROM chart above Goal status: NOT MET  4.  Patient will demonstrate at least 90 degrees of Rt shoulder abduction  AROM to improve ability to complete reaching activities.  Baseline: deferred 08/16/24: see ROM chart above Goal status: MET  5.  Patient will score </= 20 % disability on the QuickDASH (MCID is 8-15.9) to signify clinically meaningful improvement in functional abilities.   Baseline: see above 08/16/24: 52.3% Goal status: MET   PLAN: PT FREQUENCY: 1x/week  PT DURATION: 8 weeks  PLANNED INTERVENTIONS: 97164- PT  Re-evaluation, 97750- Physical Performance Testing, 97110-Therapeutic exercises, 97530- Therapeutic activity, [id number]- Neuromuscular re-education, 97535- Self Care, [id number]- Manual therapy, 97016- Vasopneumatic device, 79439 (1-2 muscles), 20561 (3+ muscles)- Dry Needling, Cryotherapy, and Moist heat    Lucie Meeter, PT, DPT, ATC 11/15/2024 4:02 PM  "

## 2024-11-22 ENCOUNTER — Ambulatory Visit

## 2024-11-29 ENCOUNTER — Ambulatory Visit
Admission: EM | Admit: 2024-11-29 | Discharge: 2024-11-29 | Disposition: A | Discharge status: Home / Self Care | Attending: Family Medicine | Admitting: Family Medicine

## 2024-11-29 ENCOUNTER — Encounter: Payer: Self-pay | Admitting: Emergency Medicine

## 2024-11-29 ENCOUNTER — Ambulatory Visit: Admitting: Rheumatology

## 2024-11-29 DIAGNOSIS — R350 Frequency of micturition: Secondary | ICD-10-CM | POA: Insufficient documentation

## 2024-11-29 DIAGNOSIS — R10A1 Flank pain, right side: Secondary | ICD-10-CM | POA: Insufficient documentation

## 2024-11-29 DIAGNOSIS — R300 Dysuria: Secondary | ICD-10-CM | POA: Diagnosis present

## 2024-11-29 LAB — POCT URINALYSIS DIP (MANUAL ENTRY)
Bilirubin, UA: NEGATIVE
Glucose, UA: NEGATIVE mg/dL
Ketones, POC UA: NEGATIVE mg/dL
Leukocytes, UA: NEGATIVE
Nitrite, UA: NEGATIVE
Protein Ur, POC: NEGATIVE mg/dL
Spec Grav, UA: 1.01
Urobilinogen, UA: 0.2 U/dL
pH, UA: 5.5

## 2024-11-29 MED ORDER — CEFTRIAXONE SODIUM 1 G IJ SOLR
1000.0000 mg | Freq: Once | INTRAMUSCULAR | Status: AC
  Administered 2024-11-29: 1000 mg via INTRAMUSCULAR

## 2024-11-29 NOTE — ED Triage Notes (Signed)
 Pt c/o low back pain worse on right side for the last couple days. States she has also been having urinary frequency. Pain worsened last night. She has not taken anything for this.

## 2024-11-29 NOTE — Discharge Instructions (Signed)
 Your urine has been sent to the laboratory for culture You can check the results on MyChart You be called if any additional antibiotics are needed May take Tylenol  for mild pain in your back.  May take hydrocodone  when pain is severe Make sure you are drinking lots of fluids See your doctor if not improving by the end of the week

## 2024-11-29 NOTE — ED Provider Notes (Signed)
 " Melissa Rodgers    CSN: [id number] Arrival date & time: 11/29/24  1226      History   Chief Complaint Chief Complaint  Patient presents with   Back Pain    HPI Melissa Rodgers is a 74 y.o. female.   Patient states she has had urinary frequency for over a week.  Some dysuria.  She states that she did a home  UTI test that was positive.  She has been trying to increase her fluids and treat herself at home.  She does not like to take antibiotics.  She states oral antibiotics gave her terrible heartburn.  She often has minor low back pain.  Since yesterday she has had increased right flank pain.  She is concerned about a kidney infection and is here for evaluation.  Denies fever or chills.  Denies nausea and vomiting.  Denies prior history of kidney stones or kidney infection    Past Medical History:  Diagnosis Date   Allergy    seasonal   Cataract 2024   Chronic bilateral low back pain without sciatica 10/07/2016   DDD (degenerative disc disease), cervical 12/03/2020   Diverticulitis of sigmoid colon 08/01/2019   Dysrhythmia    Afib   Essential hypertension 09/09/2018   Family history of diabetes mellitus 06/09/2022   Fibromyalgia    Gastroesophageal reflux disease without esophagitis 11/06/2015   Generalized anxiety disorder with panic attacks 08/01/2019   History of prosthetic unicompartmental arthroplasty of left knee 09/06/2019   History of prosthetic unicompartmental arthroplasty of right knee 09/06/2019   Hyperlipidemia 09/09/2018   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   Lumbosacral spondylosis 10/07/2016   Mass of lower inner quadrant of right breast 07/11/2022   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/2021   Mild coronary artery disease 05/04/2024   Mixed dyslipidemia 09/09/2018   Non-seasonal allergic rhinitis 09/09/2018   Paroxysmal atrial fibrillation (HCC) 07/15/2021   Polyarthralgia with elevated ESR 03/15/2024   Preoperative  cardiovascular examination 05/04/2024   Primary osteoarthritis of both knees post bilateral hemiarthroplasty 10/07/2016   Rotator cuff tear, right 01/29/2024   S/P laparoscopic fundoplication 03/17/2016   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    Patient Active Problem List   Diagnosis Date Noted   Cataract    Mild coronary artery disease 05/04/2024   Preoperative cardiovascular examination 05/04/2024   Polyarthralgia with elevated ESR 03/15/2024   Rotator cuff tear, right 01/29/2024   Allergy    Mass of lower inner quadrant of right breast 07/11/2022   Family history of diabetes mellitus 06/09/2022   Paroxysmal atrial fibrillation (HCC) 07/15/2021   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/2021   DDD (degenerative disc disease), cervical 12/03/2020   Fibromyalgia 03/05/2020   History of prosthetic unicompartmental arthroplasty of left knee 09/06/2019   History of prosthetic unicompartmental arthroplasty of right knee 09/06/2019   Diverticulitis of sigmoid colon 08/01/2019   Generalized anxiety disorder with panic attacks 08/01/2019   Non-seasonal allergic rhinitis 09/09/2018   Essential hypertension 09/09/2018   Mixed dyslipidemia 09/09/2018   Hyperlipidemia 09/09/2018   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   Lumbosacral spondylosis 10/07/2016   Primary osteoarthritis of both knees post bilateral hemiarthroplasty 10/07/2016   Chronic bilateral low back pain without sciatica 10/07/2016   Atrial fibrillation 04/17/2016   S/P laparoscopic fundoplication 03/17/2016   Gastroesophageal reflux disease without esophagitis 11/06/2015   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    Past Surgical History:  Procedure Laterality Date   COLONOSCOPY  10/04/2019   Dr.  Onetha Larger, [facility]. polyp 2 mm in the sigmoid colon, ( Polypectomy). Moderate diverticulosis of the descending colon and sigmoid colon. Lipoma in the ascending colon. Internal hemorrhoids.    ESOPHAGOGASTRODUODENOSCOPY     possibly last one was 2017. In [place]   HERNIA REPAIR     JOINT REPLACEMENT     MEDIAL PARTIAL KNEE REPLACEMENT Right 12/14/2014   NISSEN FUNDOPLICATION  02/21/2016   OOPHORECTOMY     PARTIAL KNEE ARTHROPLASTY Left 04/05/2014   TOTAL KNEE REVISION Left 05/10/2021   Procedure: TOTAL KNEE REVISION;  Surgeon: Yvone Rush, MD;  Location: [facility];  Service: Orthopedics;  Laterality: Left;   TUBAL LIGATION      OB History   No obstetric history on file.      Home Medications    Prior to Admission medications  Medication Sig Start Date End Date Taking? Authorizing Provider  Acetaminophen  (TYLENOL  PO) Take 2 tablets by mouth daily as needed (pain).    [provider]  bismuth subsalicylate (PEPTO BISMOL) 262 MG/15ML suspension Take 15 mLs by mouth as needed for indigestion.    [provider]  calcium  carbonate (TUMS - DOSED IN MG ELEMENTAL CALCIUM ) 500 MG chewable tablet Chew 1,000 mg by mouth as needed for indigestion or heartburn.    [provider]  cetirizine (ZYRTEC) 10 MG tablet Take 10 mg by mouth at bedtime.    [provider]  clonazePAM  (KLONOPIN ) 0.5 MG tablet Take 0.5 tablets (0.25 mg total) by mouth 2 (two) times daily as needed for anxiety. 12/11/23   Jessup, Joy, NP  Cyanocobalamin (B-12 PO) Take 1 tablet by mouth 2 (two) times a week.    [provider]  DIGESTIVE ENZYMES PO Take 1 capsule by mouth as needed.    [provider]  ELIQUIS  5 MG TABS tablet TAKE 1 TABLET TWICE A DAY 10/12/24   Monetta Redell PARAS, MD  flecainide  (TAMBOCOR ) 50 MG tablet TAKE 1 TABLET(50 MG) BY MOUTH TWICE DAILY 10/24/24   Monetta Redell PARAS, MD  FLUoxetine  (PROZAC ) 20 MG tablet Take 1 tablet (20 mg total) by mouth daily. 06/09/24   Willo Mini, NP  gabapentin  (NEURONTIN ) 100 MG capsule Take 1-3 capsules (100-300 mg total) by mouth 3 (three) times daily as needed. 07/11/24   Willo Mini, NP  metoprolol  succinate (TOPROL -XL) 25  MG 24 hr tablet Take 1 tablet (25 mg total) by mouth daily. 10/12/24   Monetta Redell PARAS, MD  ondansetron  (ZOFRAN -ODT) 4 MG disintegrating tablet Take 1 tablet (4 mg total) by mouth every 6 (six) hours as needed for nausea or vomiting. 04/22/23   Cirigliano, Vito V, DO  Polyethyl Glycol-Propyl Glycol (SYSTANE OP) Place 1 drop into both eyes in the morning and at bedtime.    [provider]  ZINC OXIDE, TOPICAL, EX Apply 1 Application topically as needed (rash under stomach).    [provider]    Family History Family History  Problem Relation Age of Onset   Diabetes Mother    Heart disease Mother    Anxiety disorder Mother    Arthritis Mother    Depression Mother    Cancer Father        small places of skin cancer to face, unsure of type   Alzheimer's disease Father        Symptom onset in late 35s   Heart disease Father    Hypertension Father    Atrial fibrillation Sister    Diabetes Sister  Hypertension Sister    Tremor Other        Significant maternal history of tremor; unknown regarding Parkinson's disease diagnoses   Colon cancer Neg Hx    Esophageal cancer Neg Hx    Stomach cancer Neg Hx    Rectal cancer Neg Hx     Social History Social History[1]   Allergies   Crestor [rosuvastatin], Sulfa antibiotics, and Latex   Review of Systems Review of Systems See HPI  Physical Exam Triage Vital Signs ED Triage Vitals  Encounter Vitals Group     BP 11/29/24 1239 132/84     Girls Systolic BP Percentile --      Girls Diastolic BP Percentile --      Boys Systolic BP Percentile --      Boys Diastolic BP Percentile --      Pulse Rate 11/29/24 1239 77     Resp --      Temp 11/29/24 1239 97.9 F (36.6 C)     Temp Source 11/29/24 1239 Oral     SpO2 11/29/24 1239 98 %     Weight --      Height --      Head Circumference --      Peak Flow --      Pain Score 11/29/24 1242 3     Pain Loc --      Pain Education --      Exclude from Growth Chart --     No data found.  Updated Vital Signs BP 132/84 (BP Location: Right Arm)   Pulse 77   Temp 97.9 F (36.6 C) (Oral)   SpO2 98%       Physical Exam Constitutional:      General: She is not in acute distress.    Appearance: She is well-developed.     Comments: Mild overweight  HENT:     Head: Normocephalic and atraumatic.  Eyes:     Conjunctiva/sclera: Conjunctivae normal.     Pupils: Pupils are equal, round, and reactive to light.  Cardiovascular:     Rate and Rhythm: Normal rate.  Pulmonary:     Effort: Pulmonary effort is normal. No respiratory distress.  Abdominal:     Tenderness: There is no right CVA tenderness or left CVA tenderness.  Musculoskeletal:        General: Tenderness present. Normal range of motion.     Cervical back: Normal range of motion.     Comments: Mild tenderness in the lower lumbar column of muscles on the right, and right SI joint  Skin:    General: Skin is warm and dry.  Neurological:     Mental Status: She is alert.      UC Treatments / Results  Labs (all labs ordered are listed, but only abnormal results are displayed) Labs Reviewed  POCT URINALYSIS DIP (MANUAL ENTRY) - Abnormal; Notable for the following components:      Result Value   Blood, UA small (*)    All other components within normal limits  URINE CULTURE    EKG   Radiology No results found.  Procedures Procedures (including critical Rodgers time)  Medications Ordered in UC Medications  cefTRIAXone  (ROCEPHIN ) injection 1,000 mg (has no administration in time range)    Initial Impression / Assessment and Plan / UC Course  I have reviewed the triage vital signs and the nursing notes.  Pertinent labs & imaging results that were available during my Rodgers of the patient were reviewed by  me and considered in my medical decision making (see chart for details).     I discussed with patient culturing her urine and placing her on antibiotics based on her symptoms.   Patient really prefers not to take any oral antibiotics.  I will give her a shot of antibiotic, Rocephin , and check a urine culture.  I did warn her she may need additional antibiotics depending on what the culture shows.  She will take Tylenol  for her back pain.  She has hydrocodone  to take if pain is severe. Final Clinical Impressions(s) / UC Diagnoses   Final diagnoses:  Dysuria  Right flank pain  Increased urinary frequency     Discharge Instructions      Your urine has been sent to the laboratory for culture You can check the results on MyChart You be called if any additional antibiotics are needed May take Tylenol  for mild pain in your back.  May take hydrocodone  when pain is severe Make sure you are drinking lots of fluids See your doctor if not improving by the end of the week   ED Prescriptions   None    PDMP not reviewed this encounter.    [1]  Social History Tobacco Use   Smoking status: Former    Current packs/day: 0.00    Average packs/day: 0.3 packs/day for 5.1 years (1.3 ttl pk-yrs)    Types: Cigarettes    Start date: 07/25/1978    Quit date: 08/18/1983    Years since quitting: 41.3    Passive exposure: Past   Smokeless tobacco: Never  Vaping Use   Vaping status: Never Used  Substance Use Topics   Alcohol use: Yes    Alcohol/week: 1.0 - 3.0 standard drink of alcohol    Types: 1 - 3 Glasses of wine per week    Comment: 1-2 drinks a week, usually wine   Drug use: Never     Maranda Jamee Jacob, MD 11/29/24 1306  "

## 2024-11-30 LAB — URINE CULTURE
Culture: NO GROWTH
Special Requests: NORMAL

## 2024-12-12 ENCOUNTER — Ambulatory Visit: Admitting: Medical-Surgical

## 2024-12-12 ENCOUNTER — Encounter: Payer: Self-pay | Admitting: Medical-Surgical

## 2024-12-12 VITALS — BP 122/75 | HR 62 | Temp 98.0°F | Resp 16 | Ht 63.0 in | Wt 178.0 lb

## 2024-12-12 DIAGNOSIS — R300 Dysuria: Secondary | ICD-10-CM | POA: Diagnosis not present

## 2024-12-12 DIAGNOSIS — I480 Paroxysmal atrial fibrillation: Secondary | ICD-10-CM | POA: Diagnosis not present

## 2024-12-12 DIAGNOSIS — F410 Panic disorder [episodic paroxysmal anxiety] without agoraphobia: Secondary | ICD-10-CM | POA: Diagnosis not present

## 2024-12-12 DIAGNOSIS — T466X5A Adverse effect of antihyperlipidemic and antiarteriosclerotic drugs, initial encounter: Secondary | ICD-10-CM

## 2024-12-12 DIAGNOSIS — G720 Drug-induced myopathy: Secondary | ICD-10-CM

## 2024-12-12 DIAGNOSIS — Z1329 Encounter for screening for other suspected endocrine disorder: Secondary | ICD-10-CM

## 2024-12-12 DIAGNOSIS — I10 Essential (primary) hypertension: Secondary | ICD-10-CM

## 2024-12-12 DIAGNOSIS — M2550 Pain in unspecified joint: Secondary | ICD-10-CM | POA: Diagnosis not present

## 2024-12-12 DIAGNOSIS — F411 Generalized anxiety disorder: Secondary | ICD-10-CM | POA: Diagnosis not present

## 2024-12-12 DIAGNOSIS — E782 Mixed hyperlipidemia: Secondary | ICD-10-CM | POA: Diagnosis not present

## 2024-12-12 DIAGNOSIS — E66811 Obesity, class 1: Secondary | ICD-10-CM | POA: Diagnosis not present

## 2024-12-12 DIAGNOSIS — M47817 Spondylosis without myelopathy or radiculopathy, lumbosacral region: Secondary | ICD-10-CM

## 2024-12-12 LAB — POCT URINALYSIS DIP (CLINITEK)
Bilirubin, UA: NEGATIVE
Glucose, UA: NEGATIVE mg/dL
Ketones, POC UA: NEGATIVE mg/dL
Leukocytes, UA: NEGATIVE
Nitrite, UA: NEGATIVE
POC PROTEIN,UA: NEGATIVE
Spec Grav, UA: 1.01
Urobilinogen, UA: 0.2 U/dL
pH, UA: 6.5

## 2024-12-12 MED ORDER — CLONAZEPAM 0.5 MG PO TABS: 0.2500 mg | ORAL_TABLET | Freq: Two times a day (BID) | ORAL | 1 refills | Status: AC | PRN

## 2024-12-12 NOTE — Progress Notes (Unsigned)
 "       Established patient visit   History of Present Illness   Discussed the use of AI scribe software for clinical note transcription with the patient, who gave verbal consent to proceed.  History of Present Illness   Melissa Rodgers is a 74 year old female who presents with recurrent urinary symptoms and dry mouth.  Recurrent urinary symptoms - Persistent unpleasant odor during urination - History of brown urine and back pain - Positive home urine tests for leukocytes and nitrites - Received antibiotic injection at urgent care with partial relief; odor persists - Recent urine culture negative - Intermittent use of miconazole suppositories with only temporary relief - Prior finding of small amounts of blood in urine  Xerostomia and mucosal dryness - Chronic dry mouth, recently worsened - Development of mouth sores - Drinks water throughout the day for symptom relief - Tried biotine mouthwash and home remedies with only temporary relief - Associated dryness of eyes, nose, and lips - Takes Zyrtec 5 mg each morning  Anxiety and vasomotor symptoms - Anxiety treated with fluoxetine  20mg  daily, effective for symptoms - Uses clonazepam  very infrequently as needed for breakthrough physical anxiety symptoms - Experiences hot flashes, particularly after taking medications, with cycles of feeling hot then cold  Chronic pain and musculoskeletal symptoms - Takes gabapentin  200 mg every 12 hours for chronic pain - Pain primarily in back, with additional hip, knee, and wrist discomfort - History of reverse shoulder replacement and completed physical therapy - Continues to have generalized aches and pains  Atherosclerotic cardiovascular disease - History of arterial atherosclerotic cardiovascular disease - Not on statin therapy due to prior statin-induced myopathy     Physical Exam   Physical Exam Vitals reviewed.  Constitutional:      General: She is not in acute distress.     Appearance: Normal appearance. She is not ill-appearing.  HENT:     Head: Normocephalic and atraumatic.  Cardiovascular:     Rate and Rhythm: Normal rate and regular rhythm.     Pulses: Normal pulses.     Heart sounds: Normal heart sounds. No murmur heard.    No friction rub. No gallop.  Pulmonary:     Effort: Pulmonary effort is normal. No respiratory distress.     Breath sounds: Normal breath sounds. No wheezing.  Skin:    General: Skin is warm and dry.  Neurological:     Mental Status: She is alert and oriented to person, place, and time.  Psychiatric:        Mood and Affect: Mood normal.        Behavior: Behavior normal.        Thought Content: Thought content normal.        Judgment: Judgment normal.    Assessment & Plan   Dysuria Recurrent urinary symptoms with hematuria, likely non-bacterial. Atrophic vaginitis may contribute. Differential includes bacterial vaginosis and mycoplasma/ureaplasma. - Perform self-swab for yeast and bacterial vaginosis. - Consider ureaplasma testing if wet prep is normal. - Continue miconazole suppositories as needed.  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 Inconsistent adherence noted. - Continue gabapentin  200 mg every 12 hours.  Generalized anxiety disorder Managed with fluoxetine  and prn clonazepam . - Refilled clonazepam  for breakthrough symptoms. - Continue fluoxetine  20mg  daily.   Mixed dyslipidemia with statin-induced myopathy Intolerance to statins due to myopathy.  - Documented statin-induced myopathy in record.   Hypertension/Paroxysmal A fib (HCC) Blood  pressure stable and at goal. Managed by Cardiology. - Follow up with Cardiology as instructed.  - Updating labs.   Screening for endocrine disorder with class I obesity - Checking TSH and A1c.     Follow up   Return in about 6 months (around 06/11/2025) for chronic disease follow  up. __________________________________ Zada FREDRIK Palin, DNP, APRN, FNP-BC Primary Care and Sports Medicine [facility] "

## 2024-12-13 ENCOUNTER — Ambulatory Visit: Payer: Self-pay | Admitting: Medical-Surgical

## 2024-12-13 LAB — HEMOGLOBIN A1C
Est. average glucose Bld gHb Est-mCnc: 123 mg/dL
Hgb A1c MFr Bld: 5.9 % — ABNORMAL HIGH (ref 4.8–5.6)

## 2024-12-13 LAB — COMPREHENSIVE METABOLIC PANEL WITH GFR
ALT: 12 IU/L (ref 0–32)
AST: 19 IU/L (ref 0–40)
Albumin: 4.2 g/dL (ref 3.8–4.8)
Alkaline Phosphatase: 84 IU/L (ref 49–135)
BUN/Creatinine Ratio: 28 (ref 12–28)
BUN: 13 mg/dL (ref 8–27)
Bilirubin Total: 0.4 mg/dL (ref 0.0–1.2)
CO2: 22 mmol/L (ref 20–29)
Calcium: 9.1 mg/dL (ref 8.7–10.3)
Chloride: 104 mmol/L (ref 96–106)
Creatinine, Ser: 0.47 mg/dL — ABNORMAL LOW (ref 0.57–1.00)
Globulin, Total: 2.8 g/dL (ref 1.5–4.5)
Glucose: 77 mg/dL (ref 70–99)
Potassium: 4.4 mmol/L (ref 3.5–5.2)
Sodium: 140 mmol/L (ref 134–144)
Total Protein: 7 g/dL (ref 6.0–8.5)
eGFR: 100 mL/min/1.73

## 2024-12-13 LAB — CBC WITH DIFFERENTIAL/PLATELET
Basophils Absolute: 0.1 x10E3/uL (ref 0.0–0.2)
Basos: 1 %
EOS (ABSOLUTE): 0.2 x10E3/uL (ref 0.0–0.4)
Eos: 3 %
Hematocrit: 43.7 % (ref 34.0–46.6)
Hemoglobin: 13.9 g/dL (ref 11.1–15.9)
Immature Grans (Abs): 0 x10E3/uL (ref 0.0–0.1)
Immature Granulocytes: 0 %
Lymphocytes Absolute: 2.1 x10E3/uL (ref 0.7–3.1)
Lymphs: 28 %
MCH: 28.6 pg (ref 26.6–33.0)
MCHC: 31.8 g/dL (ref 31.5–35.7)
MCV: 90 fL (ref 79–97)
Monocytes Absolute: 0.7 x10E3/uL (ref 0.1–0.9)
Monocytes: 9 %
Neutrophils Absolute: 4.6 x10E3/uL (ref 1.4–7.0)
Neutrophils: 59 %
Platelets: 339 x10E3/uL (ref 150–450)
RBC: 4.86 x10E6/uL (ref 3.77–5.28)
RDW: 13.5 % (ref 11.7–15.4)
WBC: 7.7 x10E3/uL (ref 3.4–10.8)

## 2024-12-13 LAB — WET PREP FOR TRICH, YEAST, CLUE
Clue Cell Exam: NEGATIVE
Trichomonas Exam: NEGATIVE
Yeast Exam: NEGATIVE

## 2024-12-13 LAB — SPECIMEN STATUS REPORT

## 2024-12-13 LAB — LIPID PANEL
Chol/HDL Ratio: 4.5 ratio — ABNORMAL HIGH (ref 0.0–4.4)
Cholesterol, Total: 217 mg/dL — ABNORMAL HIGH (ref 100–199)
HDL: 48 mg/dL
LDL Chol Calc (NIH): 125 mg/dL — ABNORMAL HIGH (ref 0–99)
Triglycerides: 249 mg/dL — ABNORMAL HIGH (ref 0–149)
VLDL Cholesterol Cal: 44 mg/dL — ABNORMAL HIGH (ref 5–40)

## 2024-12-13 LAB — MYCOPLASMA / UREAPLASMA HOMINIS CULTURE

## 2024-12-13 LAB — TSH: TSH: 1.9 u[IU]/mL (ref 0.450–4.500)

## 2024-12-14 LAB — URINE CULTURE

## 2024-12-15 LAB — MYCOPLASMA / UREAPLASMA HOMINIS CULTURE

## 2024-12-16 LAB — GENITAL MYCOPLASMAS NAA, URINE

## 2024-12-16 LAB — SPECIMEN STATUS REPORT

## 2025-01-23 ENCOUNTER — Ambulatory Visit: Payer: Medicare Other

## 2025-06-13 ENCOUNTER — Ambulatory Visit: Admitting: Medical-Surgical
